# Patient Record
Sex: Male | Born: 1985 | Race: Black or African American | Hispanic: No | Marital: Single | Smoking: Never smoker
Health system: Southern US, Community
[De-identification: ages and names within clinical notes are randomized; demographics above are authoritative.]

## PROBLEM LIST (undated history)

## (undated) DIAGNOSIS — R569 Unspecified convulsions: Secondary | ICD-10-CM

## (undated) DIAGNOSIS — T8859XA Other complications of anesthesia, initial encounter: Secondary | ICD-10-CM

## (undated) DIAGNOSIS — N289 Disorder of kidney and ureter, unspecified: Secondary | ICD-10-CM

## (undated) DIAGNOSIS — D649 Anemia, unspecified: Secondary | ICD-10-CM

## (undated) DIAGNOSIS — N186 End stage renal disease: Secondary | ICD-10-CM

## (undated) HISTORY — PX: NEPHRECTOMY: SHX65

---
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
## 2009-05-12 ENCOUNTER — Emergency Department (HOSPITAL_COMMUNITY): Admission: EM | Admit: 2009-05-12 | Discharge: 2009-05-13 | Payer: Self-pay | Admitting: Emergency Medicine

## 2009-07-07 ENCOUNTER — Emergency Department (HOSPITAL_COMMUNITY): Admission: EM | Admit: 2009-07-07 | Discharge: 2009-07-07 | Payer: Self-pay | Admitting: Emergency Medicine

## 2010-02-03 DEATH — deceased

## 2010-06-24 LAB — URINE MICROSCOPIC-ADD ON

## 2010-06-24 LAB — DIFFERENTIAL
Basophils Absolute: 0 10*3/uL (ref 0.0–0.1)
Basophils Relative: 0 % (ref 0–1)
Eosinophils Absolute: 0.1 10*3/uL (ref 0.0–0.7)
Monocytes Relative: 13 % — ABNORMAL HIGH (ref 3–12)
Neutrophils Relative %: 77 % (ref 43–77)

## 2010-06-24 LAB — CBC
HCT: 45.9 % (ref 39.0–52.0)
Hemoglobin: 15.6 g/dL (ref 13.0–17.0)
RBC: 5.1 MIL/uL (ref 4.22–5.81)

## 2010-06-24 LAB — COMPREHENSIVE METABOLIC PANEL
ALT: 14 U/L (ref 0–53)
Alkaline Phosphatase: 111 U/L (ref 39–117)
BUN: 17 mg/dL (ref 6–23)
CO2: 25 mEq/L (ref 19–32)
Chloride: 107 mEq/L (ref 96–112)
GFR calc non Af Amer: 46 mL/min — ABNORMAL LOW (ref >60)
Glucose, Bld: 88 mg/dL (ref 70–99)
Potassium: 3.5 mEq/L (ref 3.5–5.1)
Sodium: 139 mEq/L (ref 135–145)
Total Bilirubin: 0.8 mg/dL (ref 0.3–1.2)
Total Protein: 7.9 g/dL (ref 6.0–8.3)

## 2010-06-24 LAB — URINALYSIS, ROUTINE W REFLEX MICROSCOPIC
Bilirubin Urine: NEGATIVE
Glucose, UA: NEGATIVE mg/dL
Ketones, ur: NEGATIVE mg/dL
Leukocytes, UA: NEGATIVE
Nitrite: NEGATIVE
Protein, ur: 100 mg/dL — AB
Specific Gravity, Urine: 1.01 (ref 1.005–1.030)
Urobilinogen, UA: 0.2 mg/dL (ref 0.0–1.0)
pH: 6.5 (ref 5.0–8.0)

## 2010-10-12 ENCOUNTER — Emergency Department (HOSPITAL_COMMUNITY)
Admission: EM | Admit: 2010-10-12 | Discharge: 2010-10-13 | Disposition: A | Discharge status: Home / Self Care | Payer: Self-pay | Attending: Emergency Medicine | Admitting: Emergency Medicine

## 2010-10-12 DIAGNOSIS — M549 Dorsalgia, unspecified: Secondary | ICD-10-CM | POA: Insufficient documentation

## 2010-10-13 ENCOUNTER — Emergency Department (HOSPITAL_COMMUNITY): Payer: Self-pay

## 2012-06-25 ENCOUNTER — Emergency Department (HOSPITAL_COMMUNITY): Payer: Self-pay

## 2012-06-25 ENCOUNTER — Emergency Department (HOSPITAL_COMMUNITY)
Admission: EM | Admit: 2012-06-25 | Discharge: 2012-06-25 | Disposition: A | Discharge status: Home / Self Care | Payer: Self-pay | Attending: Emergency Medicine | Admitting: Emergency Medicine

## 2012-06-25 ENCOUNTER — Encounter (HOSPITAL_COMMUNITY): Payer: Self-pay | Admitting: *Deleted

## 2012-06-25 DIAGNOSIS — R42 Dizziness and giddiness: Secondary | ICD-10-CM | POA: Insufficient documentation

## 2012-06-25 DIAGNOSIS — N289 Disorder of kidney and ureter, unspecified: Secondary | ICD-10-CM | POA: Insufficient documentation

## 2012-06-25 DIAGNOSIS — Y9289 Other specified places as the place of occurrence of the external cause: Secondary | ICD-10-CM | POA: Insufficient documentation

## 2012-06-25 DIAGNOSIS — M549 Dorsalgia, unspecified: Secondary | ICD-10-CM

## 2012-06-25 DIAGNOSIS — Y9301 Activity, walking, marching and hiking: Secondary | ICD-10-CM | POA: Insufficient documentation

## 2012-06-25 DIAGNOSIS — S79929A Unspecified injury of unspecified thigh, initial encounter: Secondary | ICD-10-CM | POA: Insufficient documentation

## 2012-06-25 DIAGNOSIS — S79919A Unspecified injury of unspecified hip, initial encounter: Secondary | ICD-10-CM | POA: Insufficient documentation

## 2012-06-25 DIAGNOSIS — IMO0002 Reserved for concepts with insufficient information to code with codable children: Secondary | ICD-10-CM | POA: Insufficient documentation

## 2012-06-25 DIAGNOSIS — Z905 Acquired absence of kidney: Secondary | ICD-10-CM | POA: Insufficient documentation

## 2012-06-25 LAB — POCT I-STAT, CHEM 8
BUN: 26 mg/dL — ABNORMAL HIGH (ref 6–23)
Calcium, Ion: 1.25 mmol/L — ABNORMAL HIGH (ref 1.12–1.23)
Creatinine, Ser: 2 mg/dL — ABNORMAL HIGH (ref 0.50–1.35)
Hemoglobin: 15 g/dL (ref 13.0–17.0)
TCO2: 27 mmol/L (ref 0–100)

## 2012-06-25 LAB — URINALYSIS, ROUTINE W REFLEX MICROSCOPIC
Glucose, UA: NEGATIVE mg/dL
Ketones, ur: NEGATIVE mg/dL
Leukocytes, UA: NEGATIVE
Protein, ur: >300 mg/dL — AB
Urobilinogen, UA: 0.2 mg/dL (ref 0.0–1.0)

## 2012-06-25 LAB — URINE MICROSCOPIC-ADD ON

## 2012-06-25 MED ORDER — OXYCODONE-ACETAMINOPHEN 5-325 MG PO TABS
1.0000 | ORAL_TABLET | Freq: Once | ORAL | Status: AC
  Administered 2012-06-25: 1 via ORAL
  Filled 2012-06-25: qty 1

## 2012-06-25 MED ORDER — OXYCODONE-ACETAMINOPHEN 5-325 MG PO TABS: 2.0000 | ORAL_TABLET | ORAL | Status: DC | PRN

## 2012-06-25 NOTE — ED Notes (Signed)
Pt in br 2 minutes after checking in
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
## 2012-06-26 NOTE — ED Provider Notes (Signed)
Medical screening examination/treatment/procedure(s) were performed by non-physician practitioner and as supervising physician I was immediately available for consultation/collaboration.  Veryl Speak, MD 06/26/12 [phone]

## 2018-02-19 ENCOUNTER — Emergency Department (HOSPITAL_COMMUNITY): Payer: Self-pay

## 2018-02-19 ENCOUNTER — Other Ambulatory Visit: Payer: Self-pay

## 2018-02-19 ENCOUNTER — Emergency Department (HOSPITAL_COMMUNITY)
Admission: EM | Admit: 2018-02-19 | Discharge: 2018-02-19 | Disposition: A | Discharge status: Home / Self Care | Payer: Self-pay | Attending: Emergency Medicine | Admitting: Emergency Medicine

## 2018-02-19 ENCOUNTER — Encounter (HOSPITAL_COMMUNITY): Payer: Self-pay

## 2018-02-19 DIAGNOSIS — Y998 Other external cause status: Secondary | ICD-10-CM | POA: Insufficient documentation

## 2018-02-19 DIAGNOSIS — Y9389 Activity, other specified: Secondary | ICD-10-CM | POA: Insufficient documentation

## 2018-02-19 DIAGNOSIS — W208XXA Other cause of strike by thrown, projected or falling object, initial encounter: Secondary | ICD-10-CM | POA: Insufficient documentation

## 2018-02-19 DIAGNOSIS — S62336A Displaced fracture of neck of fifth metacarpal bone, right hand, initial encounter for closed fracture: Secondary | ICD-10-CM | POA: Insufficient documentation

## 2018-02-19 DIAGNOSIS — Y929 Unspecified place or not applicable: Secondary | ICD-10-CM | POA: Insufficient documentation

## 2018-02-19 HISTORY — DX: Disorder of kidney and ureter, unspecified: N28.9

## 2018-02-19 MED ORDER — OXYCODONE-ACETAMINOPHEN 5-325 MG PO TABS: 1.0000 | ORAL_TABLET | ORAL | 0 refills | Status: DC | PRN
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
## 2018-02-19 NOTE — ED Triage Notes (Signed)
Pt reports he was working on his car and hood came down on right hand. Hand noted to be swollen with abrasions to fingers

## 2018-02-19 NOTE — Discharge Instructions (Signed)
You have cracked a small bone in your hand.  Splint, ice, elevate.  Prescription for pain medicine.  Follow-up with orthopedic doctor.  Phone number given.

## 2020-11-03 DIAGNOSIS — Z9289 Personal history of other medical treatment: Secondary | ICD-10-CM

## 2020-11-03 HISTORY — DX: Personal history of other medical treatment: Z92.89

## 2020-11-13 ENCOUNTER — Other Ambulatory Visit: Payer: Self-pay

## 2020-11-13 ENCOUNTER — Emergency Department (HOSPITAL_COMMUNITY): Payer: Medicaid Other

## 2020-11-13 ENCOUNTER — Inpatient Hospital Stay (HOSPITAL_COMMUNITY)
Admission: EM | Admit: 2020-11-13 | Discharge: 2020-11-24 | DRG: 674 | Disposition: A | Discharge status: Home / Self Care | Payer: Medicaid Other | Attending: Family Medicine | Admitting: Family Medicine

## 2020-11-13 ENCOUNTER — Ambulatory Visit (HOSPITAL_COMMUNITY)
Admission: EM | Admit: 2020-11-13 | Discharge: 2020-11-13 | Disposition: A | Discharge status: Home / Self Care | Payer: Self-pay | Attending: Emergency Medicine | Admitting: Emergency Medicine

## 2020-11-13 ENCOUNTER — Encounter (HOSPITAL_COMMUNITY): Payer: Self-pay | Admitting: Emergency Medicine

## 2020-11-13 DIAGNOSIS — Z28310 Unvaccinated for covid-19: Secondary | ICD-10-CM

## 2020-11-13 DIAGNOSIS — R42 Dizziness and giddiness: Secondary | ICD-10-CM | POA: Diagnosis present

## 2020-11-13 DIAGNOSIS — R7989 Other specified abnormal findings of blood chemistry: Secondary | ICD-10-CM | POA: Diagnosis present

## 2020-11-13 DIAGNOSIS — Z20822 Contact with and (suspected) exposure to covid-19: Secondary | ICD-10-CM | POA: Diagnosis present

## 2020-11-13 DIAGNOSIS — Z992 Dependence on renal dialysis: Secondary | ICD-10-CM

## 2020-11-13 DIAGNOSIS — Z6821 Body mass index (BMI) 21.0-21.9, adult: Secondary | ICD-10-CM

## 2020-11-13 DIAGNOSIS — D649 Anemia, unspecified: Secondary | ICD-10-CM

## 2020-11-13 DIAGNOSIS — R000 Tachycardia, unspecified: Secondary | ICD-10-CM | POA: Diagnosis present

## 2020-11-13 DIAGNOSIS — N1832 Chronic kidney disease, stage 3b: Secondary | ICD-10-CM | POA: Diagnosis present

## 2020-11-13 DIAGNOSIS — M899 Disorder of bone, unspecified: Secondary | ICD-10-CM

## 2020-11-13 DIAGNOSIS — N35919 Unspecified urethral stricture, male, unspecified site: Secondary | ICD-10-CM | POA: Diagnosis present

## 2020-11-13 DIAGNOSIS — M869 Osteomyelitis, unspecified: Secondary | ICD-10-CM

## 2020-11-13 DIAGNOSIS — D509 Iron deficiency anemia, unspecified: Secondary | ICD-10-CM | POA: Diagnosis present

## 2020-11-13 DIAGNOSIS — N186 End stage renal disease: Secondary | ICD-10-CM | POA: Diagnosis not present

## 2020-11-13 DIAGNOSIS — N2581 Secondary hyperparathyroidism of renal origin: Secondary | ICD-10-CM | POA: Diagnosis present

## 2020-11-13 DIAGNOSIS — R197 Diarrhea, unspecified: Secondary | ICD-10-CM | POA: Diagnosis present

## 2020-11-13 DIAGNOSIS — N19 Unspecified kidney failure: Secondary | ICD-10-CM

## 2020-11-13 DIAGNOSIS — E872 Acidosis: Secondary | ICD-10-CM | POA: Diagnosis present

## 2020-11-13 DIAGNOSIS — D631 Anemia in chronic kidney disease: Secondary | ICD-10-CM | POA: Diagnosis present

## 2020-11-13 DIAGNOSIS — Z905 Acquired absence of kidney: Secondary | ICD-10-CM

## 2020-11-13 DIAGNOSIS — N136 Pyonephrosis: Secondary | ICD-10-CM | POA: Diagnosis present

## 2020-11-13 DIAGNOSIS — N390 Urinary tract infection, site not specified: Secondary | ICD-10-CM

## 2020-11-13 DIAGNOSIS — E876 Hypokalemia: Secondary | ICD-10-CM | POA: Diagnosis present

## 2020-11-13 DIAGNOSIS — D72829 Elevated white blood cell count, unspecified: Secondary | ICD-10-CM

## 2020-11-13 DIAGNOSIS — N2882 Megaloureter: Secondary | ICD-10-CM | POA: Diagnosis present

## 2020-11-13 DIAGNOSIS — R634 Abnormal weight loss: Secondary | ICD-10-CM | POA: Diagnosis present

## 2020-11-13 DIAGNOSIS — Z8249 Family history of ischemic heart disease and other diseases of the circulatory system: Secondary | ICD-10-CM

## 2020-11-13 DIAGNOSIS — E538 Deficiency of other specified B group vitamins: Secondary | ICD-10-CM | POA: Diagnosis present

## 2020-11-13 DIAGNOSIS — N179 Acute kidney failure, unspecified: Principal | ICD-10-CM | POA: Diagnosis present

## 2020-11-13 DIAGNOSIS — R0789 Other chest pain: Secondary | ICD-10-CM

## 2020-11-13 LAB — COMPREHENSIVE METABOLIC PANEL
ALT: 9 U/L (ref 0–44)
AST: 13 U/L — ABNORMAL LOW (ref 15–41)
Albumin: 2.7 g/dL — ABNORMAL LOW (ref 3.5–5.0)
Alkaline Phosphatase: 112 U/L (ref 38–126)
Anion gap: 15 (ref 5–15)
BUN: 111 mg/dL — ABNORMAL HIGH (ref 6–20)
CO2: 10 mmol/L — ABNORMAL LOW (ref 22–32)
Calcium: 8 mg/dL — ABNORMAL LOW (ref 8.9–10.3)
Chloride: 106 mmol/L (ref 98–111)
Creatinine, Ser: 12.59 mg/dL — ABNORMAL HIGH (ref 0.61–1.24)
GFR, Estimated: 5 mL/min — ABNORMAL LOW (ref >60)
Glucose, Bld: 118 mg/dL — ABNORMAL HIGH (ref 70–99)
Potassium: 4.1 mmol/L (ref 3.5–5.1)
Sodium: 131 mmol/L — ABNORMAL LOW (ref 135–145)
Total Bilirubin: 0.2 mg/dL — ABNORMAL LOW (ref 0.3–1.2)
Total Protein: 7.2 g/dL (ref 6.5–8.1)

## 2020-11-13 LAB — CBC WITH DIFFERENTIAL/PLATELET
Abs Immature Granulocytes: 0.46 10*3/uL — ABNORMAL HIGH (ref 0.00–0.07)
Basophils Absolute: 0 10*3/uL (ref 0.0–0.1)
Basophils Relative: 0 %
Eosinophils Absolute: 0 10*3/uL (ref 0.0–0.5)
Eosinophils Relative: 0 %
HCT: 19.9 % — ABNORMAL LOW (ref 39.0–52.0)
Hemoglobin: 6 g/dL — CL (ref 13.0–17.0)
Immature Granulocytes: 2 %
Lymphocytes Relative: 1 %
Lymphs Abs: 0.2 10*3/uL — ABNORMAL LOW (ref 0.7–4.0)
MCH: 28.3 pg (ref 26.0–34.0)
MCHC: 30.2 g/dL (ref 30.0–36.0)
MCV: 93.9 fL (ref 80.0–100.0)
Monocytes Absolute: 0.9 10*3/uL (ref 0.1–1.0)
Monocytes Relative: 3 %
Neutro Abs: 28.3 10*3/uL — ABNORMAL HIGH (ref 1.7–7.7)
Neutrophils Relative %: 94 %
Platelets: 324 10*3/uL (ref 150–400)
RBC: 2.12 MIL/uL — ABNORMAL LOW (ref 4.22–5.81)
RDW: 14.6 % (ref 11.5–15.5)
WBC: 29.9 10*3/uL — ABNORMAL HIGH (ref 4.0–10.5)
nRBC: 0 % (ref 0.0–0.2)

## 2020-11-13 LAB — URINALYSIS, ROUTINE W REFLEX MICROSCOPIC
Bilirubin Urine: NEGATIVE
Glucose, UA: NEGATIVE mg/dL
Ketones, ur: NEGATIVE mg/dL
Nitrite: POSITIVE — AB
Protein, ur: 100 mg/dL — AB
Specific Gravity, Urine: 1.009 (ref 1.005–1.030)
WBC, UA: >50 WBC/hpf — ABNORMAL HIGH (ref 0–5)
pH: 5 (ref 5.0–8.0)

## 2020-11-13 LAB — IRON AND TIBC
Iron: 9 ug/dL — ABNORMAL LOW (ref 45–182)
Saturation Ratios: 6 % — ABNORMAL LOW (ref 17.9–39.5)
TIBC: 153 ug/dL — ABNORMAL LOW (ref 250–450)
UIBC: 144 ug/dL

## 2020-11-13 LAB — ABO/RH: ABO/RH(D): B POS

## 2020-11-13 LAB — URIC ACID: Uric Acid, Serum: 8.7 mg/dL — ABNORMAL HIGH (ref 3.7–8.6)

## 2020-11-13 LAB — RETICULOCYTES
Immature Retic Fract: 20.8 % — ABNORMAL HIGH (ref 2.3–15.9)
RBC.: 2.14 MIL/uL — ABNORMAL LOW (ref 4.22–5.81)
Retic Count, Absolute: 63.8 10*3/uL (ref 19.0–186.0)
Retic Ct Pct: 3 % (ref 0.4–3.1)

## 2020-11-13 LAB — PROTIME-INR
INR: 1.5 — ABNORMAL HIGH (ref 0.8–1.2)
Prothrombin Time: 18.3 seconds — ABNORMAL HIGH (ref 11.4–15.2)

## 2020-11-13 LAB — MAGNESIUM: Magnesium: 1.4 mg/dL — ABNORMAL LOW (ref 1.7–2.4)

## 2020-11-13 LAB — VITAMIN B12: Vitamin B-12: 249 pg/mL (ref 180–914)

## 2020-11-13 LAB — D-DIMER, QUANTITATIVE: D-Dimer, Quant: 10.97 ug/mL-FEU — ABNORMAL HIGH (ref 0.00–0.50)

## 2020-11-13 LAB — RESP PANEL BY RT-PCR (FLU A&B, COVID) ARPGX2
Influenza A by PCR: NEGATIVE
Influenza B by PCR: NEGATIVE
SARS Coronavirus 2 by RT PCR: NEGATIVE

## 2020-11-13 LAB — PREPARE RBC (CROSSMATCH)

## 2020-11-13 LAB — FERRITIN: Ferritin: 1033 ng/mL — ABNORMAL HIGH (ref 24–336)

## 2020-11-13 LAB — PHOSPHORUS: Phosphorus: 8.8 mg/dL — ABNORMAL HIGH (ref 2.5–4.6)

## 2020-11-13 LAB — FOLATE: Folate: 6.8 ng/mL (ref >5.9)

## 2020-11-13 LAB — LACTATE DEHYDROGENASE: LDH: 143 U/L (ref 98–192)

## 2020-11-13 LAB — FIBRINOGEN: Fibrinogen: >800 mg/dL — ABNORMAL HIGH (ref 210–475)

## 2020-11-13 MED ORDER — SODIUM CHLORIDE 0.9% FLUSH
3.0000 mL | Freq: Two times a day (BID) | INTRAVENOUS | Status: DC
  Administered 2020-11-13 – 2020-11-22 (×14): 3 mL via INTRAVENOUS

## 2020-11-13 MED ORDER — SODIUM CHLORIDE 0.9 % IV BOLUS
1000.0000 mL | Freq: Once | INTRAVENOUS | Status: AC
  Administered 2020-11-13: 1000 mL via INTRAVENOUS

## 2020-11-13 MED ORDER — SODIUM CHLORIDE 0.9 % IV SOLN: 10.0000 mL/h | Freq: Once | INTRAVENOUS | Status: DC

## 2020-11-13 MED ORDER — POLYETHYLENE GLYCOL 3350 17 G PO PACK: 17.0000 g | PACK | Freq: Every day | ORAL | Status: DC | PRN

## 2020-11-13 MED ORDER — SODIUM CHLORIDE 0.9 % IV SOLN
1.0000 g | INTRAVENOUS | Status: DC
  Administered 2020-11-13: 1 g via INTRAVENOUS
  Filled 2020-11-13 (×2): qty 10

## 2020-11-13 MED ORDER — ONDANSETRON HCL 4 MG/2ML IJ SOLN
INTRAMUSCULAR | Status: AC
  Filled 2020-11-13: qty 2

## 2020-11-13 MED ORDER — ONDANSETRON HCL 4 MG/2ML IJ SOLN: 4.0000 mg | Freq: Once | INTRAMUSCULAR | Status: DC

## 2020-11-13 MED ORDER — ACETAMINOPHEN 650 MG RE SUPP: 650.0000 mg | Freq: Four times a day (QID) | RECTAL | Status: DC | PRN

## 2020-11-13 MED ORDER — LACTATED RINGERS IV SOLN: INTRAVENOUS | Status: DC

## 2020-11-13 MED ORDER — ONDANSETRON HCL 4 MG/2ML IJ SOLN
4.0000 mg | Freq: Once | INTRAMUSCULAR | Status: AC
  Administered 2020-11-13: 4 mg via INTRAVENOUS

## 2020-11-13 MED ORDER — ACETAMINOPHEN 325 MG PO TABS: 650.0000 mg | ORAL_TABLET | Freq: Four times a day (QID) | ORAL | Status: DC | PRN

## 2020-11-13 NOTE — ED Notes (Signed)
[facility] Contacted for transport 

## 2020-11-13 NOTE — ED Provider Notes (Signed)
I saw and evaluated the patient, reviewed the resident's note and I agree with the findings and plan.  Pertinent History: This patient is an extremely friendly 35 year old male who unfortunately presents to the hospital with multiple problems, he initially states that he found that his heart rate was high and his blood pressure was low and he has been very weak which has been progressive over the last month.  He recalls not having any significant chronic medical conditions other than having one of his kidneys removed at the age of 4 months after he had what appears to be hydronephrosis based on his mother's description of how they treated it.  He has never had any further medical issues he has not been on steroids he has not been plagued with any surgical problems and takes no daily medicines.  He reports to me that he has had a slight decrease in his urinary output over the last month but still has urinated 3 times today.  He has been progressively nauseated but has not vomited, he denies diarrhea, has not had any significant amount of rectal bleeding.  He denies abdominal pain or chest pain or coughing or shortness of breath but does state that when he exerts himself he does get a little bit dyspneic.  Pertinent Exam findings: On exam this patient has a moon pie face, he has no lymphadenopathy of his neck or his inguinal region, there is a slight prominence of the right sternal end of the clavicle on the right.  The patient has no edema of the legs, he has pale conjunctive a and pale nailbeds.  He has moist mucous membranes.  His heart and lung exam is unremarkable except for mild tachycardia and he has no abdominal tenderness or hepatosplenomegaly.  I was personally present and directly supervised the following procedures:  Medical evaluation Resuscitation for severe anemia and hematologic abnormalities Evaluation with CT scan of chest abdomen pelvis to look for causes of the patient's significant  abnormality such as cancer Consider that this could be some type of lymphoma or other significant abnormality Urinary tract infection is possible as well Rocephin was given, urinalysis reveals signs of UTI Creatinine is severely abnormal and he is in florid kidney failure but thankfully normokalemic The patient will need to be admitted to high level of care in the hospital for for ongoing evaluation and stabilizing care, critical care provided  .Critical Care  Date/Time: 11/13/2020 10:26 PM Performed by: Noemi Chapel, MD Authorized by: Noemi Chapel, MD   Critical care provider statement:    Critical care time (minutes):  35   Critical care time was exclusive of:  Separately billable procedures and treating other patients and teaching time   Critical care was necessary to treat or prevent imminent or life-threatening deterioration of the following conditions:  Renal failure   Critical care was time spent personally by me on the following activities:  Blood draw for specimens, development of treatment plan with patient or surrogate, discussions with consultants, evaluation of patient's response to treatment, examination of patient, obtaining history from patient or surrogate, ordering and performing treatments and interventions, ordering and review of laboratory studies, ordering and review of radiographic studies, pulse oximetry, re-evaluation of patient's condition and review of old charts Comments:         I personally interpreted the EKG as well as the resident and agree with the interpretation on the resident's chart.  Final diagnoses:  AKI (acute kidney injury) ([place])  Anemia, unspecified type  Severe Anemia Severe Renal Failure UTI    Noemi Chapel, MD 11/14/20 1446

## 2020-11-13 NOTE — ED Notes (Signed)
Foley was attempted, pt has a lot of resistance when inserting foley. MD made aware.

## 2020-11-13 NOTE — ED Notes (Signed)
Back to room at this time.

## 2020-11-13 NOTE — ED Notes (Signed)
Consent signed by mother at bedside. Pt feels weak to sign. Risks and consequences understood by patient and mother.

## 2020-11-13 NOTE — ED Provider Notes (Signed)
Emergency Medicine Provider Triage Evaluation Note  Micheal Bean , a 35 y.o. male  was evaluated in triage.  Pt complains of headaches, generalized weakness, lightheadedness, nausea, and diarrhea.  Patient states that he has had diarrhea intermittently over the last 2 weeks.  Denies any blood in stool or melena.  Denies any recent ABX or travel.    Patient states that he has had headaches, generalized weakness, and lightheadedness over the last 3 to 4 days.  Denies any syncopal episodes.  Reports that he has lost 20 pounds in the last month.  Patient has had decreased appetite intermittently over this time.  Patient was sent to emergency department from urgent care.  Patient reports feeling better after getting fluid bolus.  Review of Systems  Positive: Headaches, generalized weakness, lightheadedness, nausea, diarrhea Negative: Abdominal pain, vomiting, blood in stool, melena, fever, chills, URI symptoms, difficulty urinating, dysuria, decreased urinary output  Physical Exam  BP 118/68 (BP Location: Right Arm)   Pulse (!) 118   Temp 98.3 F (36.8 C) (Oral)   Resp 18   SpO2 100%  Gen:   Awake, no distress   Resp:  Normal effort, lungs clear to auscultation bilaterally MSK:   Moves extremities without difficulty  Other:  Abdomen soft, nondistended, nontender, no guarding, no rebound tenderness.  Medical Decision Making  Medically screening exam initiated at 7:30 PM.  Appropriate orders placed.  NIXIN COUSIN was informed that the remainder of the evaluation will be completed by another provider, this initial triage assessment does not replace that evaluation, and the importance of remaining in the ED until their evaluation is complete.  The patient appears stable so that the remainder of the work up may be completed by another provider.      Micheal Beckwith, PA-C 11/13/20 1938    Drenda Freeze, MD 11/13/20 [phone]

## 2020-11-13 NOTE — ED Notes (Signed)
Report given to ED

## 2020-11-13 NOTE — ED Triage Notes (Signed)
Pt BIB [facility] from urgent care, c/o diarrhea x weeks, lightheadedness when standing and weakness. +orthostatic vital signs.

## 2020-11-13 NOTE — H&P (Signed)
History and Physical   Micheal Bean [id number] DOB: 03/16/1986 DOA: 11/13/2020  PCP: Patient, No Pcp Per (Inactive)   Patient coming from: Home  Chief Complaint: Headache, lightheadedness, chest tightness for 3 to 4 days.  HPI: Micheal Bean is a 35 y.o. male with medical history significant of history of right nephrectomy as a infant presenting with constellation of intermittent symptoms including headache, lightheadedness and chest tightness, As above has had 3-4 days of head ache, lightheadedness, chest tightness, nausea, diarrhea.  He tried taking some fluids at home did seem to help he also tried taking a dose of his mother's amlodipine but did not seem to help.  He does feel better laying down but it feels worse standing up.  He does note losing 20 pounds in the last 2 months.  He was seen in urgent care and then sent to the ED for further evaluation.  He denies fevers, chills, shortness of breath, abdominal pain, constipation. Denies dark or bloody stool.  ED Course: Vital signs in the ED significant for heart rate intermittently in the 100s otherwise stable.  Lab work-up showed CMP with sodium 131, bicarb 10, BUN 111, creatinine 12.59 which is increased from normal per last labs were 8 years ago.  Calcium of 8 which corrects considering albumin 2.7.  CBC showed hemoglobin of 6 and leukocytosis to 29.9 with some immature granulocytes noted.  Respiratory panel for flu and COVID pending.  PT and INR pending.  Urinalysis showed protein, nitrates, leukocytes, white cells, bacteria.  Urine cultures pending.  Reticulocyte count had returned separately normal.  Patient was typed and screened.  VBG ordered and is pending.  Other labs ordered that are pending include magnesium, phosphorus, D-dimer, uric acid, fibrinogen, B12, folate, iron, ferritin, LDH, haptoglobin.  Imaging study included chest x-ray which showed no abnormality of clavicle and no other acute abnormalities.  CT chest did show edema  and soft tissue changes at the clavicle concerning for osteomyelitis versus bone tumor and CT of the abdomen pelvis did show that the patient is status post right nephrectomy which was known and had severe left hydronephrosis and hydroureter to the bladder.  Patient was discussed with nephrology who had nothing to add beyond decompression of bladder and ureter with Foley placement and will see the patient morning.  Review of Systems: As per HPI otherwise all other systems reviewed and are negative.  Past Medical History:  Diagnosis Date   Renal disorder    only has right kidney    Past Surgical History:  Procedure Laterality Date   NEPHRECTOMY     childhhod, RT side    Social History  reports that he has never smoked. He has never used smokeless tobacco. He reports current alcohol use. He reports current drug use. Drug: Marijuana.  No Known Allergies  Family History  Problem Relation Age of Onset   Hypertension Mother   Reviewed on admission  Prior to Admission medications   Medication Sig Start Date End Date Taking? Authorizing Provider  naproxen sodium (ALEVE) 220 MG tablet Take 220 mg by mouth daily as needed (pain).    [provider]  oxyCODONE-acetaminophen (PERCOCET/ROXICET) 5-325 MG per tablet Take 2 tablets by mouth every 4 (four) hours as needed for pain. 06/25/12   Piepenbrink, Anderson [place], PA-C  oxyCODONE-acetaminophen (PERCOCET/ROXICET) 5-325 MG tablet Take 1 tablet by mouth every 4 (four) hours as needed for severe pain. 02/19/18   Nat Christen, MD    Physical Exam: Vitals:   11/13/20  2255 11/13/20 2300 11/13/20 2330 11/14/20 0003  BP: 129/78 124/75 129/79 128/74  Pulse: (!) 101 98 99 (!) 108  Resp: '19 10 15 '$ (!) 26  Temp:    98.8 F (37.1 C)  TempSrc:    Oral  SpO2: 100% 100% 100%    Physical Exam Constitutional:      General: He is not in acute distress.    Appearance: Normal appearance.  HENT:     Head: Normocephalic and atraumatic.      Mouth/Throat:     Mouth: Mucous membranes are moist.     Pharynx: Oropharynx is clear.  Eyes:     Extraocular Movements: Extraocular movements intact.     Pupils: Pupils are equal, round, and reactive to light.  Cardiovascular:     Rate and Rhythm: Regular rhythm. Tachycardia present.     Pulses: Normal pulses.     Heart sounds: Normal heart sounds.  Pulmonary:     Effort: Pulmonary effort is normal. No respiratory distress.     Breath sounds: Normal breath sounds.  Abdominal:     General: Bowel sounds are normal. There is no distension.     Palpations: Abdomen is soft.     Tenderness: There is no abdominal tenderness.  Musculoskeletal:        General: No swelling or deformity.  Skin:    General: Skin is warm and dry.  Neurological:     General: No focal deficit present.     Mental Status: Mental status is at baseline.   Labs on Admission: I have personally reviewed following labs and imaging studies  CBC: Recent Labs  Lab 11/13/20 1951 11/14/20 0000  WBC 29.9*  --   NEUTROABS 28.3*  --   HGB 6.0* 6.8*  HCT 19.9* 20.0*  MCV 93.9  --   PLT 324  --     Basic Metabolic Panel: Recent Labs  Lab 11/13/20 1951 11/13/20 2143 11/14/20 0000  NA 131*  --  134*  K 4.1  --  4.5  CL 106  --   --   CO2 10*  --   --   GLUCOSE 118*  --   --   BUN 111*  --   --   CREATININE 12.59*  --   --   CALCIUM 8.0*  --   --   MG  --  1.4*  --   PHOS  --  8.8*  --     GFR: CrCl cannot be calculated (Unknown ideal weight.).  Liver Function Tests: Recent Labs  Lab 11/13/20 1951  AST 13*  ALT 9  ALKPHOS 112  BILITOT 0.2*  PROT 7.2  ALBUMIN 2.7*    Urine analysis:    Component Value Date/Time   COLORURINE YELLOW 11/13/2020 1931   APPEARANCEUR CLOUDY (A) 11/13/2020 1931   LABSPEC 1.009 11/13/2020 1931   PHURINE 5.0 11/13/2020 1931   GLUCOSEU NEGATIVE 11/13/2020 1931   HGBUR SMALL (A) 11/13/2020 1931   BILIRUBINUR NEGATIVE 11/13/2020 1931   KETONESUR NEGATIVE 11/13/2020  1931   PROTEINUR 100 (A) 11/13/2020 1931   UROBILINOGEN 0.2 06/25/2012 1826   NITRITE POSITIVE (A) 11/13/2020 1931   LEUKOCYTESUR LARGE (A) 11/13/2020 1931    Radiological Exams on Admission: CT ABDOMEN PELVIS WO CONTRAST  Result Date: 11/13/2020 CLINICAL DATA:  Diarrhea swelling over proximal right clavicle EXAM: CT CHEST, ABDOMEN AND PELVIS WITHOUT CONTRAST TECHNIQUE: Multidetector CT imaging of the chest, abdomen and pelvis was performed following the standard protocol without IV contrast. COMPARISON:  CT 06/25/2012 FINDINGS: CT CHEST FINDINGS Cardiovascular: Limited evaluation without intravenous contrast. Aorta is nonaneurysmal. Normal cardiac size. No pericardial effusion. Mediastinum/Nodes: Midline trachea. No thyroid mass. No suspicious nodes. Esophagus normal Lungs/Pleura: Lungs are clear. No pleural effusion or pneumothorax. Musculoskeletal: Mild edema and soft tissue swelling/thickening centered at the right sternoclavicular joint. Irregular lucency within the head of the right clavicle. Similar but less extensive changes involving head of left clavicle. Focal heterogeneous lucency within the right first rib, series 4, image 34. Bones appear diffusely dense CT ABDOMEN PELVIS FINDINGS Hepatobiliary: No focal liver abnormality is seen. No gallstones, gallbladder wall thickening, or biliary dilatation. Pancreas: Unremarkable. No pancreatic ductal dilatation or surrounding inflammatory changes. Spleen: Normal in size without focal abnormality. Adrenals/Urinary Tract: Adrenal glands are normal. Status post right nephrectomy. Severe left-sided hydronephrosis and hydroureter with tortuous left ureter dilated down to the level of the bladder. Dilated right ureteral remnant with cystic enlargement of the distal right ureter. Bladder is unremarkable. Stomach/Bowel: Stomach nonenlarged. No dilated small bowel. Negative appendix. No acute bowel wall thickening Vascular/Lymphatic: Nonaneurysmal aorta.  No  suspicious nodes Reproductive: Prostate is unremarkable. Other: Negative for pelvic effusion or free air Musculoskeletal: Bones appear diffusely increased in density suggesting metabolic bone disease, possibly due to chronic kidney disease. 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 Interval resorptive changes at bilateral SI joints with some sclerosis. No inflammatory changes by CT. IMPRESSION: 1. Edema and soft tissue thickening centered the right sternoclavicular joint with heterogeneous lucency involving the head of the right clavicle. Similar changes involving head of left clavicle though less extensive. Findings could be secondary to osteomyelitis and infection with bone tumor an additional consideration. Further evaluation with MRI could be considered. Additional heterogeneous focal indeterminate lucent lesion in the right first rib posteriorly. 2. Status post right nephrectomy. Severe left-sided hydronephrosis and hydroureter down to the level of the bladder. Interval cortical atrophy of left kidney. There is right ureteral remnant which is also dilated with cystic change distally. Electronically Signed   By: Donavan Foil M.D.   On: 11/13/2020 23:11   CT Chest Wo Contrast  Result Date: 11/13/2020 CLINICAL DATA:  Diarrhea swelling over proximal right clavicle EXAM: CT CHEST, ABDOMEN AND PELVIS WITHOUT CONTRAST TECHNIQUE: Multidetector CT imaging of the chest, abdomen and pelvis was performed following the standard protocol without IV contrast. COMPARISON:  CT 06/25/2012 FINDINGS: CT CHEST FINDINGS Cardiovascular: Limited evaluation without intravenous contrast. Aorta is nonaneurysmal. Normal cardiac size. No pericardial effusion. Mediastinum/Nodes: Midline trachea. No thyroid mass. No suspicious nodes. Esophagus normal Lungs/Pleura: Lungs are clear. No pleural effusion or pneumothorax. Musculoskeletal: Mild edema and soft tissue swelling/thickening centered at the right sternoclavicular joint.  Irregular lucency within the head of the right clavicle. Similar but less extensive changes involving head of left clavicle. Focal heterogeneous lucency within the right first rib, series 4, image 34. Bones appear diffusely dense CT ABDOMEN PELVIS FINDINGS Hepatobiliary: No focal liver abnormality is seen. No gallstones, gallbladder wall thickening, or biliary dilatation. Pancreas: Unremarkable. No pancreatic ductal dilatation or surrounding inflammatory changes. Spleen: Normal in size without focal abnormality. Adrenals/Urinary Tract: Adrenal glands are normal. Status post right nephrectomy. Severe left-sided hydronephrosis and hydroureter with tortuous left ureter dilated down to the level of the bladder. Dilated right ureteral remnant with cystic enlargement of the distal right ureter. Bladder is unremarkable. Stomach/Bowel: Stomach nonenlarged. No dilated small bowel. Negative appendix. No acute bowel wall thickening Vascular/Lymphatic: Nonaneurysmal aorta.  No suspicious nodes Reproductive: Prostate is unremarkable. Other: Negative for pelvic  effusion or free air Musculoskeletal: Bones appear diffusely increased in density suggesting metabolic bone disease, possibly due to chronic kidney disease. 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 Interval resorptive changes at bilateral SI joints with some sclerosis. No inflammatory changes by CT. IMPRESSION: 1. Edema and soft tissue thickening centered the right sternoclavicular joint with heterogeneous lucency involving the head of the right clavicle. Similar changes involving head of left clavicle though less extensive. Findings could be secondary to osteomyelitis and infection with bone tumor an additional consideration. Further evaluation with MRI could be considered. Additional heterogeneous focal indeterminate lucent lesion in the right first rib posteriorly. 2. Status post right nephrectomy. Severe left-sided hydronephrosis and hydroureter down to the level  of the bladder. Interval cortical atrophy of left kidney. There is right ureteral remnant which is also dilated with cystic change distally. Electronically Signed   By: Donavan Foil M.D.   On: 11/13/2020 23:11   DG Chest Portable 1 View  Result Date: 11/13/2020 CLINICAL DATA:  Evaluate proximal right clavicular head. EXAM: PORTABLE CHEST 1 VIEW COMPARISON:  None. FINDINGS: No gross abnormality of the proximal right clavicular head is evident within the limitations of a single portable frontal radiograph of the chest. Low lung volumes with streaky opacities favoring atelectatic change. No consolidative process. No pneumothorax or layering effusion. The cardiomediastinal contours are unremarkable. No acute osseous or soft tissue abnormality. Telemetry leads overlie the chest. IMPRESSION: No gross abnormality of the proximal right clavicular head though evaluation is limited on this single view portable chest radiograph. Could consider dedicated clavicular radiographs or cross-sectional imaging if there is persisting clinical concern. Electronically Signed   By: Lovena Le M.D.   On: 11/13/2020 22:36    EKG: Independently reviewed.  Sinus tachycardia 114 bpm.  QTc 457.  Assessment/Plan Principal Problem:   Acute renal failure (ARF) (HCC) Active Problems:   Acute lower UTI   Leukocytosis   Bone lesion  Acute renal failure > Patient noted to have acute renal failure with creatinine greater than 12 from a normal baseline but last check was 8 years ago. > Does have history of solitary kidney and is status post right nephrectomy as an infant suspected to be due to hydronephrosis at that time. > Patient found to have severe hydronephrosis of the remaining left kidney. > Nephrology consulted by EDP who recommend Foley placement and monitoring response and will see patient in the morning. - Appreciate nephrology recommendations - Continue with catheter placement - Trend renal function and  electrolytes  UTI Leukocytosis > Patient with evidence of UTI on urinalysis with protein, nitrate, leukocytes, white cells, bacteria.  Also noted to have leukocytosis to 29.9 which also could be concerning for possible malignancy as below. > Started on ceftriaxone in the ED - Continue with ceftriaxone - Trend fever curve and white count - Follow-up urine culture and blood culture  Bone lesion Leukocytosis > Patient has leukocytosis and UTI as above.  Also noted to have bony abnormality of clavicle. > T scan showed changes consistent with possible osteomyelitis versus bone tumor. > Patient also reporting weight loss over the last 2 months concerning for malignancy. > With concurrent infection difficult to delineate what degree of leukocytosis could be from infection versus malignancy also with possibility of osteomyelitis and no primaries or other lesions other than rib located on CT scan (though this was non-contrast) we will continue with infectious treatment and monitoring response. > He may have progressed to bacteremia and hematogenous spread for osteomyelitis  in the setting of UTI and hydronephrosis however this does seem less likely given his presentation does not seem severe enough for this. - We will likely benefit from oncology consult in the morning pending on response to initial therapy. - Continue to treat infection and monitor response as above. - Follow-up labs ordered in ED  Anemia > Noted to have significant anemia with a hemoglobin of 6. > Labs that had returned include a reticulocyte count which was inappropriately normal.  Some immature granulocytes also noted on CBC. > Could be related to possible malignancy as above however also could be related to gradual worsening of renal function with hydronephrosis leading to worsening anemia. > No dark or bloody bowel movements. - Check FOBT  - Continue with transfusion initiated in the ED - Trend hemoglobin - Follow-up iron,  folate, B12, ferritin, LDH, haptoglobin  DVT prophylaxis: SCDs Code Status:   Full  Family Communication:  Mother updated at bedside   Disposition Plan:   Patient is from:  Home  Anticipated DC to:  Home  Anticipated DC date:  1 to 4 days  Anticipated DC barriers: None  Consults called:  Nephrology consulted by EDP who will see the patient morning.  Will likely benefit from oncology consult in the morning as well pending on results.   Admission status:  Observation, telemetry   Severity of Illness: The appropriate patient status for this patient is OBSERVATION. Observation status is judged to be reasonable and necessary in order to provide the required intensity of service to ensure the patient's safety. The patient's presenting symptoms, physical exam findings, and initial radiographic and laboratory data in the context of their medical condition is felt to place them at decreased risk for further clinical deterioration. Furthermore, it is anticipated that the patient will be medically stable for discharge from the hospital within 2 midnights of admission. The following factors support the patient status of observation.   " The patient's presenting symptoms include headache, lightheadedness, chest tightness, nausea, weight loss. " The physical exam findings include tachycardia. " The initial radiographic and laboratory data are Lab work-up showed CMP with sodium 131, bicarb 10, BUN 111, creatinine 12.59 which is increased from normal per last labs were 8 years ago.  Calcium of 8 which corrects considering albumin 2.7.  CBC showed hemoglobin of 6 and leukocytosis to 29.9 with some immature granulocytes noted.  Respiratory panel for flu and COVID pending.  PT and INR pending.  Urinalysis showed protein, nitrates, leukocytes, white cells, bacteria.  Urine cultures pending.  Reticulocyte count had returned separately normal.  Patient was typed and screened.  VBG ordered and is pending.  Other labs  ordered that are pending include magnesium, phosphorus, D-dimer, uric acid, fibrinogen, B12, folate, iron, ferritin, LDH, haptoglobin.  Imaging study included chest x-ray which showed no abnormality of clavicle and no other acute abnormalities.  CT chest did show edema and soft tissue changes at the clavicle concerning for osteomyelitis versus bone tumor and CT of the abdomen pelvis did show that the patient is status post right nephrectomy which was known and had severe left hydronephrosis and hydroureter to the bladder   Marcelyn Bruins MD [facility]  How to contact the [facility] Attending or Consulting provider [place] or covering provider during after hours [place], for this patient?   Check the care team in [facility] and look for a) attending/consulting [facility] provider listed and b) the [facility] team listed Log into [email or web address] and use [facility]'s universal password to access. If you do not have the password, please contact the hospital operator. Locate the [facility] provider you are looking for under [facility] and page to a number that you can be directly reached. If you still have difficulty reaching the provider, please page the [facility] (Director on Call) for the Hospitalists listed on amion for assistance.  11/14/2020, 12:13 AM

## 2020-11-13 NOTE — ED Notes (Signed)
Patient transported to CT 

## 2020-11-13 NOTE — ED Notes (Signed)
Report to [facility].  

## 2020-11-13 NOTE — ED Triage Notes (Signed)
Patient is being discharged from the Urgent Care and sent to the Emergency Department via [place]. Per Dr. Alphonzo Cruise, patient is in need of higher level of care due to patients symptoms and condition. Patient is aware and verbalizes understanding of plan of care.  Vitals:   11/13/20 1810 11/13/20 1853  BP: (!) 97/54 123/62  Pulse: (!) 126 (!) 111  Temp: 98.5 F (36.9 C)   SpO2: 100% 100%

## 2020-11-13 NOTE — ED Provider Notes (Signed)
[facility] EMERGENCY DEPARTMENT Provider Note   CSN: [id number] Arrival date & time: 11/13/20  1921     History Chief Complaint  Patient presents with   Dizziness    Micheal Bean is a 35 y.o. male.   Dizziness Associated symptoms: no chest pain, no palpitations, no shortness of breath and no vomiting    35 year old male with a past medical history of single kidney presenting to the emergency department with 3 to 4 weeks of fatigue and unintentional weight loss.  Patient states that his fatigue is worse with exertion.  He states that he will become lightheaded if he stands too quickly or moves too suddenly.  He reports some nausea and decreased appetite.  He reports nonbloody diarrhea, 2-3 episodes per day intermittently over the past several weeks.  He states that he believes he has lost around 20 pounds in the past 3 weeks.  He denies any vomiting.  He denies any chest pain, shortness of breath.  He denies any night sweats.  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  He denies any alcohol use, drug use.  He denies any previous episodes of similar symptoms.  Past Medical History:  Diagnosis Date   Renal disorder    only has right kidney    Patient Active Problem List   Diagnosis Date Noted   Acute renal failure (ARF) ([facility]) 11/13/2020   Acute lower UTI 11/13/2020   Leukocytosis 11/13/2020   Bone lesion 11/13/2020    Past Surgical History:  Procedure Laterality Date   NEPHRECTOMY     childhhod, RT side    Family History  Problem Relation Age of Onset   Hypertension Mother    Social History   Tobacco Use   Smoking status: Never   Smokeless tobacco: Never  Substance Use Topics   Alcohol use: Yes    Comment: occassional   Drug use: Yes    Types: Marijuana   Home Medications Prior to Admission medications   Medication Sig Start Date End Date Taking? Authorizing Provider  naproxen sodium (ALEVE) 220 MG tablet Take 220 mg by  mouth daily as needed (pain).   Yes [provider]  oxyCODONE-acetaminophen (PERCOCET/ROXICET) 5-325 MG per tablet Take 2 tablets by mouth every 4 (four) hours as needed for pain. Patient not taking: Reported on 11/13/2020 06/25/12   Piepenbrink, Anderson Malta, PA-C  oxyCODONE-acetaminophen (PERCOCET/ROXICET) 5-325 MG tablet Take 1 tablet by mouth every 4 (four) hours as needed for severe pain. Patient not taking: No sig reported 02/19/18   Nat Christen, MD    Allergies    Patient has no known allergies.  Review of Systems   Review of Systems  Constitutional:  Positive for activity change, appetite change, fatigue and unexpected weight change. Negative for chills and fever.  HENT:  Negative for ear pain and sore throat.   Eyes:  Negative for pain and visual disturbance.  Respiratory:  Negative for cough and shortness of breath.   Cardiovascular:  Negative for chest pain and palpitations.  Gastrointestinal:  Negative for abdominal pain and vomiting.  Genitourinary:  Negative for dysuria and hematuria.  Musculoskeletal:  Negative for arthralgias and back pain.  Skin:  Negative for color change and rash.  Neurological:  Positive for dizziness. Negative for seizures and syncope.  All other systems reviewed and are negative.  Physical Exam Updated Vital Signs BP 129/79   Pulse 99   Temp 98.3 F (36.8 C) (Oral)  Resp 15   SpO2 100%   Physical Exam Vitals and nursing note reviewed.  Constitutional:      General: He is not in acute distress.    Appearance: He is well-developed. He is ill-appearing. He is not toxic-appearing.  HENT:     Head: Normocephalic and atraumatic.     Mouth/Throat:     Comments: No gum bleeding Eyes:     Conjunctiva/sclera: Conjunctivae normal.  Neck:     Comments: No cervical or supraclavicular lymphadenopathy Cardiovascular:     Rate and Rhythm: Normal rate and regular rhythm.     Heart sounds: No murmur heard.    Comments: Significant asymmetry  between the proximal clavicular heads, there is a firm immobile mass overlying the right proximal clavicular head Pulmonary:     Effort: Pulmonary effort is normal. No respiratory distress.     Breath sounds: Normal breath sounds.  Abdominal:     Palpations: Abdomen is soft.     Tenderness: There is no abdominal tenderness. There is no guarding or rebound.  Musculoskeletal:     Cervical back: Neck supple. No rigidity or tenderness.  Skin:    General: Skin is warm and dry.     Capillary Refill: Capillary refill takes 2 to 3 seconds.     Comments: No petechial rash  Neurological:     General: No focal deficit present.     Mental Status: He is alert and oriented to person, place, and time.    ED Results / Procedures / Treatments   Labs (all labs ordered are listed, but only abnormal results are displayed) Labs Reviewed  COMPREHENSIVE METABOLIC PANEL - Abnormal; Notable for the following components:      Result Value   Sodium 131 (*)    CO2 10 (*)    Glucose, Bld 118 (*)    BUN 111 (*)    Creatinine, Ser 12.59 (*)    Calcium 8.0 (*)    Albumin 2.7 (*)    AST 13 (*)    Total Bilirubin 0.2 (*)    GFR, Estimated 5 (*)    All other components within normal limits  CBC WITH DIFFERENTIAL/PLATELET - Abnormal; Notable for the following components:   WBC 29.9 (*)    RBC 2.12 (*)    Hemoglobin 6.0 (*)    HCT 19.9 (*)    Neutro Abs 28.3 (*)    Lymphs Abs 0.2 (*)    Abs Immature Granulocytes 0.46 (*)    All other components within normal limits  URINALYSIS, ROUTINE W REFLEX MICROSCOPIC - Abnormal; Notable for the following components:   APPearance CLOUDY (*)    Hgb urine dipstick SMALL (*)    Protein, ur 100 (*)    Nitrite POSITIVE (*)    Leukocytes,Ua LARGE (*)    WBC, UA >50 (*)    Bacteria, UA MANY (*)    All other components within normal limits  IRON AND TIBC - Abnormal; Notable for the following components:   Iron 9 (*)    TIBC 153 (*)    Saturation Ratios 6 (*)    All  other components within normal limits  FERRITIN - Abnormal; Notable for the following components:   Ferritin 1,033 (*)    All other components within normal limits  RETICULOCYTES - Abnormal; Notable for the following components:   RBC. 2.14 (*)    Immature Retic Fract 20.8 (*)    All other components within normal limits  PHOSPHORUS - Abnormal; Notable for  the following components:   Phosphorus 8.8 (*)    All other components within normal limits  MAGNESIUM - Abnormal; Notable for the following components:   Magnesium 1.4 (*)    All other components within normal limits  URIC ACID - Abnormal; Notable for the following components:   Uric Acid, Serum 8.7 (*)    All other components within normal limits  PROTIME-INR - Abnormal; Notable for the following components:   Prothrombin Time 18.3 (*)    INR 1.5 (*)    All other components within normal limits  D-DIMER, QUANTITATIVE - Abnormal; Notable for the following components:   D-Dimer, Quant 10.97 (*)    All other components within normal limits  FIBRINOGEN - Abnormal; Notable for the following components:   Fibrinogen >800 (*)    All other components within normal limits  RESP PANEL BY RT-PCR (FLU A&B, COVID) ARPGX2  URINE CULTURE  VITAMIN B12  FOLATE  LACTATE DEHYDROGENASE  HAPTOGLOBIN  HIV ANTIBODY (ROUTINE TESTING W REFLEX)  RENAL FUNCTION PANEL  PATHOLOGIST SMEAR REVIEW  TECHNOLOGIST SMEAR REVIEW  CBC  I-STAT VENOUS BLOOD GAS, ED  PREPARE RBC (CROSSMATCH)  ABO/RH  TYPE AND SCREEN    EKG EKG Interpretation  Date/Time:  Thursday November 13 2020 19:29:21 EDT Ventricular Rate:  114 PR Interval:  170 QRS Duration: 88 QT Interval:  332 QTC Calculation: 457 R Axis:   74 Text Interpretation: Sinus tachycardia Otherwise normal ECG Prolonged QT Confirmed by Noemi Chapel [phone]) on 11/13/2020 10:13:30 PM  Radiology CT ABDOMEN PELVIS WO CONTRAST  Result Date: 11/13/2020 CLINICAL DATA:  Diarrhea swelling over proximal  right clavicle EXAM: CT CHEST, ABDOMEN AND PELVIS WITHOUT CONTRAST TECHNIQUE: Multidetector CT imaging of the chest, abdomen and pelvis was performed following the standard protocol without IV contrast. COMPARISON:  CT 06/25/2012 FINDINGS: CT CHEST FINDINGS Cardiovascular: Limited evaluation without intravenous contrast. Aorta is nonaneurysmal. Normal cardiac size. No pericardial effusion. Mediastinum/Nodes: Midline trachea. No thyroid mass. No suspicious nodes. Esophagus normal Lungs/Pleura: Lungs are clear. No pleural effusion or pneumothorax. Musculoskeletal: Mild edema and soft tissue swelling/thickening centered at the right sternoclavicular joint. Irregular lucency within the head of the right clavicle. Similar but less extensive changes involving head of left clavicle. Focal heterogeneous lucency within the right first rib, series 4, image 34. Bones appear diffusely dense CT ABDOMEN PELVIS FINDINGS Hepatobiliary: No focal liver abnormality is seen. No gallstones, gallbladder wall thickening, or biliary dilatation. Pancreas: Unremarkable. No pancreatic ductal dilatation or surrounding inflammatory changes. Spleen: Normal in size without focal abnormality. Adrenals/Urinary Tract: Adrenal glands are normal. Status post right nephrectomy. Severe left-sided hydronephrosis and hydroureter with tortuous left ureter dilated down to the level of the bladder. Dilated right ureteral remnant with cystic enlargement of the distal right ureter. Bladder is unremarkable. Stomach/Bowel: Stomach nonenlarged. No dilated small bowel. Negative appendix. No acute bowel wall thickening Vascular/Lymphatic: Nonaneurysmal aorta.  No suspicious nodes Reproductive: Prostate is unremarkable. Other: Negative for pelvic effusion or free air Musculoskeletal: Bones appear diffusely increased in density suggesting metabolic bone disease, possibly due to chronic kidney disease. 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 Interval resorptive  changes at bilateral SI joints with some sclerosis. No inflammatory changes by CT. IMPRESSION: 1. Edema and soft tissue thickening centered the right sternoclavicular joint with heterogeneous lucency involving the head of the right clavicle. Similar changes involving head of left clavicle though less extensive. Findings could be secondary to osteomyelitis and infection with bone tumor an additional consideration. Further evaluation with MRI could  be considered. Additional heterogeneous focal indeterminate lucent lesion in the right first rib posteriorly. 2. Status post right nephrectomy. Severe left-sided hydronephrosis and hydroureter down to the level of the bladder. Interval cortical atrophy of left kidney. There is right ureteral remnant which is also dilated with cystic change distally. Electronically Signed   By: Donavan Foil M.D.   On: 11/13/2020 23:11   CT Chest Wo Contrast  Result Date: 11/13/2020 CLINICAL DATA:  Diarrhea swelling over proximal right clavicle EXAM: CT CHEST, ABDOMEN AND PELVIS WITHOUT CONTRAST TECHNIQUE: Multidetector CT imaging of the chest, abdomen and pelvis was performed following the standard protocol without IV contrast. COMPARISON:  CT 06/25/2012 FINDINGS: CT CHEST FINDINGS Cardiovascular: Limited evaluation without intravenous contrast. Aorta is nonaneurysmal. Normal cardiac size. No pericardial effusion. Mediastinum/Nodes: Midline trachea. No thyroid mass. No suspicious nodes. Esophagus normal Lungs/Pleura: Lungs are clear. No pleural effusion or pneumothorax. Musculoskeletal: Mild edema and soft tissue swelling/thickening centered at the right sternoclavicular joint. Irregular lucency within the head of the right clavicle. Similar but less extensive changes involving head of left clavicle. Focal heterogeneous lucency within the right first rib, series 4, image 34. Bones appear diffusely dense CT ABDOMEN PELVIS FINDINGS Hepatobiliary: No focal liver abnormality is seen. No  gallstones, gallbladder wall thickening, or biliary dilatation. Pancreas: Unremarkable. No pancreatic ductal dilatation or surrounding inflammatory changes. Spleen: Normal in size without focal abnormality. Adrenals/Urinary Tract: Adrenal glands are normal. Status post right nephrectomy. Severe left-sided hydronephrosis and hydroureter with tortuous left ureter dilated down to the level of the bladder. Dilated right ureteral remnant with cystic enlargement of the distal right ureter. Bladder is unremarkable. Stomach/Bowel: Stomach nonenlarged. No dilated small bowel. Negative appendix. No acute bowel wall thickening Vascular/Lymphatic: Nonaneurysmal aorta.  No suspicious nodes Reproductive: Prostate is unremarkable. Other: Negative for pelvic effusion or free air Musculoskeletal: Bones appear diffusely increased in density suggesting metabolic bone disease, possibly due to chronic kidney disease. 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 Interval resorptive changes at bilateral SI joints with some sclerosis. No inflammatory changes by CT. IMPRESSION: 1. Edema and soft tissue thickening centered the right sternoclavicular joint with heterogeneous lucency involving the head of the right clavicle. Similar changes involving head of left clavicle though less extensive. Findings could be secondary to osteomyelitis and infection with bone tumor an additional consideration. Further evaluation with MRI could be considered. Additional heterogeneous focal indeterminate lucent lesion in the right first rib posteriorly. 2. Status post right nephrectomy. Severe left-sided hydronephrosis and hydroureter down to the level of the bladder. Interval cortical atrophy of left kidney. There is right ureteral remnant which is also dilated with cystic change distally. Electronically Signed   By: Donavan Foil M.D.   On: 11/13/2020 23:11   DG Chest Portable 1 View  Result Date: 11/13/2020 CLINICAL DATA:  Evaluate proximal right  clavicular head. EXAM: PORTABLE CHEST 1 VIEW COMPARISON:  None. FINDINGS: No gross abnormality of the proximal right clavicular head is evident within the limitations of a single portable frontal radiograph of the chest. Low lung volumes with streaky opacities favoring atelectatic change. No consolidative process. No pneumothorax or layering effusion. The cardiomediastinal contours are unremarkable. No acute osseous or soft tissue abnormality. Telemetry leads overlie the chest. IMPRESSION: No gross abnormality of the proximal right clavicular head though evaluation is limited on this single view portable chest radiograph. Could consider dedicated clavicular radiographs or cross-sectional imaging if there is persisting clinical concern. Electronically Signed   By: Lovena Le M.D.   On:  11/13/2020 22:36    Procedures Procedures   Medications Ordered in ED Medications  0.9 %  sodium chloride infusion (has no administration in time range)  cefTRIAXone (ROCEPHIN) 1 g in sodium chloride 0.9 % 100 mL IVPB (0 g Intravenous Stopped 11/13/20 2323)  lactated ringers infusion (has no administration in time range)  sodium chloride flush (NS) 0.9 % injection 3 mL (3 mLs Intravenous Given 11/13/20 2358)  acetaminophen (TYLENOL) tablet 650 mg (has no administration in time range)    Or  acetaminophen (TYLENOL) suppository 650 mg (has no administration in time range)  polyethylene glycol (MIRALAX / GLYCOLAX) packet 17 g (has no administration in time range)   ED Course  I have reviewed the triage vital signs and the nursing notes.  Pertinent labs & imaging results that were available during my care of the patient were reviewed by me and considered in my medical decision making (see chart for details).    MDM Rules/Calculators/A&P                           36 year old male with a past medical history of unilateral single kidney presenting to the emergency department for 3 weeks of fatigue and unintentional  weight loss.  Vital signs reviewed, slightly tachycardic.  Physical exam is notable for an ill-appearing young male, pale conjunctiva.  No abdominal tenderness to palpation.  Differential includes infection, malignancy.  Labs obtained in triage reviewed.  CBC is notable for a leukocytosis with a marked left shift, as well as some immature granulocytes.  Anemia of 6.  Metabolic panel most notable for a potassium of 4.1, bicarb of 10, creatinine of 12 with a BUN of 111.  Will obtain a urine sample.  Due to patient's generalized symptoms, concerned that his presentation could represent a malignancy with possible tumor lysis.  Given his asymmetric focal swelling, will obtain advanced imaging of this area as well as the abdomen pelvis.  We will use no contrast.  Will obtain type and screen, I discussed the risks and benefits of blood transfusion with the patient and he would like to have a blood transfusion.  Will obtain B12, folate, iron studies, reticulocyte count, as well as an LDH and haptoglobin to look for hemolysis.  We will obtain a Phos and uric acid to look for tumor lysis syndrome.  We will give empiric volume expansion for AKI.  We will obtain an INR, D-dimer, fibrinogen as well.   Elevated fibrinogen and D-dimer.  Phosphorus of 8.8.  Uric acid of 8.7, no indication for rasburicase at this time.  We will continue fluid resuscitation.  Urine study shows positive nitrates and bacteria concerning for urinary tract infection, will give ceftriaxone.  CT scan shows asymmetric abnormality of the right clavicular head, osteomyelitis versus malignancy.  CT scan also shows left-sided hydroureter with significant hydronephrosis.  Patient could have an obstructive uropathy leading to systemic infection with seeding of the clavicle, but could still be malignancy on differential.  Will place a Foley catheter and admit the patient to the hospital.  Handoff given to admitting team.   Final Clinical Impression(s) / ED  Diagnoses Final diagnoses:  AKI (acute kidney injury) ([place])  Anemia, unspecified type    Rx / DC Orders ED Discharge Orders     None        Claud Kelp, MD 11/14/20 Adelfa Koh    Noemi Chapel, MD 11/14/20 1445

## 2020-11-13 NOTE — ED Triage Notes (Signed)
Pt presents with headache, lightheadedness, chest tightness, and weakness xs 3-4 days. States has has multiple episodes of nausea and diarrhea.   States has lost 20 pounds within the last 2 months.

## 2020-11-13 NOTE — ED Notes (Signed)
Together with Alexa NT, tried placing foley catheter on this pt, however would not advance/ felt resistance - Dr. Florene Glen made aware.

## 2020-11-13 NOTE — ED Provider Notes (Signed)
HPI  SUBJECTIVE:  Micheal Bean is a 35 y.o. male who presents with 3 to 4 days of headaches, generalized weakness, shortness of breath and lightheadedness.  He has had intermittent, substernal chest pain described as tightness that occasionally radiates up his neck for the past month.  He reports watery, nonbloody diarrhea, nausea, 20 pound weight loss in the past month.  He reports decreased appetite.  He states that he has episodes where he is but feels like "I am about to pass out", but denies syncope.  No palpitations.  He has had 3 episodes of diarrhea today.  He has tried pushing fluids and he took 5 mg of his mother's amlodipine earlier today at 11:00 without improvement in his symptoms.  He feels better with lying down, worse with sitting up, moving around.  No fevers, coughing, wheezing.  No calf pain, swelling.  No abdominal pain, urinary complaints, anuria. he is status post left nephrectomy and has a history of renal insufficiency.  No history of hypertension, PE, DVT.  PMD: None.  Past Medical History:  Diagnosis Date   Renal disorder    only has right kidney    Past Surgical History:  Procedure Laterality Date   NEPHRECTOMY     childhhod, RT side    Family History  Problem Relation Age of Onset   Hypertension Mother     Social History   Tobacco Use   Smoking status: Never   Smokeless tobacco: Never  Substance Use Topics   Alcohol use: Yes    Comment: occassional   Drug use: Yes    Types: Marijuana    No current facility-administered medications for this encounter.  Current Outpatient Medications:    naproxen sodium (ALEVE) 220 MG tablet, Take 220 mg by mouth daily as needed (pain)., Disp: , Rfl:    oxyCODONE-acetaminophen (PERCOCET/ROXICET) 5-325 MG per tablet, Take 2 tablets by mouth every 4 (four) hours as needed for pain., Disp: 15 tablet, Rfl: 0   oxyCODONE-acetaminophen (PERCOCET/ROXICET) 5-325 MG tablet, Take 1 tablet by mouth every 4 (four) hours as  needed for severe pain., Disp: 12 tablet, Rfl: 0  No Known Allergies   ROS  As noted in HPI.   Physical Exam  BP (!) 97/54 (BP Location: Right Arm)   Pulse (!) 126   Temp 98.5 F (36.9 C) (Oral)   SpO2 100%   Constitutional: Well developed, well nourished, pale, diaphoretic.  Appears ill. Eyes:  EOMI, conjunctiva normal bilaterally HENT: Normocephalic, atraumatic,mucus membranes moist Respiratory: Normal inspiratory effort, lungs clear bilaterally Cardiovascular: Regular tachycardia, no murmurs rubs or gallops GI: Soft, nontender, nondistended.  Active bowel sounds skin: No rash, skin intact Musculoskeletal: Calves symmetric, nontender, no edema. Neurologic: Alert & oriented x 3, no focal neuro deficits Psychiatric: Speech and behavior appropriate   ED Course   Medications  sodium chloride 0.9 % bolus 1,000 mL (1,000 mLs Intravenous New Bag/Given 11/13/20 1842)  ondansetron (ZOFRAN) injection 4 mg (4 mg Intravenous Given 11/13/20 1841)    Orders Placed This Encounter  Procedures   ED EKG    Standing Status:   Standing    Number of Occurrences:   1    Order Specific Question:   Reason for Exam    Answer:   Chest Pain   EKG 12-Lead    Standing Status:   Standing    Number of Occurrences:   1   Insert peripheral IV    Standing Status:   Standing    Number  of Occurrences:   1    No results found for this or any previous visit (from the past 24 hour(s)). No results found.  ED Clinical Impression  1. Tachycardia   2. Lightheadedness   3. Chest tightness      ED Assessment/Plan  Initial blood pressure 86/54, heart rate 126.  EKG: Sinus tachycardia, rate 124.  Normal axis, normal intervals.  No hypertrophy.  No ST-T wave changes.  Patient was having chest tightness, diaphoresis and nausea with this EKG.  Patient initially slightly hypotensive, but tachycardic. he is diaphoretic, appears pale.  He gets very lightheaded, nauseous when he sits up.  He has no  evidence of cardiac ischemia on EKG. suspect dehydration from diarrhea,  may also have an electrolyte imbalance.  He reports chest tightness and shortness of breath, but no pleuritic chest pain.  He is 100% on room air.  His abdomen is benign.  Feel that patient needs a more comprehensive work-up than can be done in the urgent care.  Starting line, fluids.  Giving Zofran 4 mg 4 mg IV.  Transferring to the ED via EMS for further evaluation.  Discussed medical decision making, EKG findings, rationale for transfer to the emergency department with patient and parent.  They agree with plan.  Meds ordered this encounter  Medications   sodium chloride 0.9 % bolus 1,000 mL   DISCONTD: ondansetron (ZOFRAN) injection 4 mg   ondansetron (ZOFRAN) injection 4 mg      *This clinic note was created using [employer]. Therefore, there may be occasional mistakes despite careful proofreading.  ?    Melynda Ripple, MD 11/13/20 [phone]

## 2020-11-14 ENCOUNTER — Observation Stay (HOSPITAL_COMMUNITY): Payer: Medicaid Other

## 2020-11-14 DIAGNOSIS — D509 Iron deficiency anemia, unspecified: Secondary | ICD-10-CM | POA: Diagnosis present

## 2020-11-14 DIAGNOSIS — Z992 Dependence on renal dialysis: Secondary | ICD-10-CM | POA: Diagnosis not present

## 2020-11-14 DIAGNOSIS — N136 Pyonephrosis: Secondary | ICD-10-CM | POA: Diagnosis present

## 2020-11-14 DIAGNOSIS — R000 Tachycardia, unspecified: Secondary | ICD-10-CM | POA: Diagnosis present

## 2020-11-14 DIAGNOSIS — R7989 Other specified abnormal findings of blood chemistry: Secondary | ICD-10-CM | POA: Diagnosis present

## 2020-11-14 DIAGNOSIS — E538 Deficiency of other specified B group vitamins: Secondary | ICD-10-CM | POA: Diagnosis present

## 2020-11-14 DIAGNOSIS — N179 Acute kidney failure, unspecified: Secondary | ICD-10-CM | POA: Diagnosis present

## 2020-11-14 DIAGNOSIS — R197 Diarrhea, unspecified: Secondary | ICD-10-CM | POA: Diagnosis present

## 2020-11-14 DIAGNOSIS — N186 End stage renal disease: Secondary | ICD-10-CM | POA: Diagnosis not present

## 2020-11-14 DIAGNOSIS — D631 Anemia in chronic kidney disease: Secondary | ICD-10-CM | POA: Diagnosis present

## 2020-11-14 DIAGNOSIS — Z905 Acquired absence of kidney: Secondary | ICD-10-CM | POA: Diagnosis not present

## 2020-11-14 DIAGNOSIS — E876 Hypokalemia: Secondary | ICD-10-CM | POA: Diagnosis present

## 2020-11-14 DIAGNOSIS — M899 Disorder of bone, unspecified: Secondary | ICD-10-CM | POA: Diagnosis present

## 2020-11-14 DIAGNOSIS — E872 Acidosis: Secondary | ICD-10-CM | POA: Diagnosis present

## 2020-11-14 DIAGNOSIS — M869 Osteomyelitis, unspecified: Secondary | ICD-10-CM

## 2020-11-14 DIAGNOSIS — N2882 Megaloureter: Secondary | ICD-10-CM | POA: Diagnosis present

## 2020-11-14 DIAGNOSIS — Z28310 Unvaccinated for covid-19: Secondary | ICD-10-CM | POA: Diagnosis not present

## 2020-11-14 DIAGNOSIS — Z8249 Family history of ischemic heart disease and other diseases of the circulatory system: Secondary | ICD-10-CM | POA: Diagnosis not present

## 2020-11-14 DIAGNOSIS — R634 Abnormal weight loss: Secondary | ICD-10-CM | POA: Diagnosis present

## 2020-11-14 DIAGNOSIS — D72829 Elevated white blood cell count, unspecified: Secondary | ICD-10-CM

## 2020-11-14 DIAGNOSIS — Z20822 Contact with and (suspected) exposure to covid-19: Secondary | ICD-10-CM | POA: Diagnosis present

## 2020-11-14 DIAGNOSIS — N35919 Unspecified urethral stricture, male, unspecified site: Secondary | ICD-10-CM | POA: Diagnosis present

## 2020-11-14 DIAGNOSIS — N390 Urinary tract infection, site not specified: Secondary | ICD-10-CM

## 2020-11-14 DIAGNOSIS — N1832 Chronic kidney disease, stage 3b: Secondary | ICD-10-CM | POA: Diagnosis present

## 2020-11-14 DIAGNOSIS — N2581 Secondary hyperparathyroidism of renal origin: Secondary | ICD-10-CM | POA: Diagnosis present

## 2020-11-14 LAB — I-STAT VENOUS BLOOD GAS, ED
Acid-base deficit: 19 mmol/L — ABNORMAL HIGH (ref 0.0–2.0)
Bicarbonate: 8.4 mmol/L — ABNORMAL LOW (ref 20.0–28.0)
Calcium, Ion: 1 mmol/L — ABNORMAL LOW (ref 1.15–1.40)
HCT: 20 % — ABNORMAL LOW (ref 39.0–52.0)
Hemoglobin: 6.8 g/dL — CL (ref 13.0–17.0)
O2 Saturation: 97 %
Potassium: 4.5 mmol/L (ref 3.5–5.1)
Sodium: 134 mmol/L — ABNORMAL LOW (ref 135–145)
TCO2: 9 mmol/L — ABNORMAL LOW (ref 22–32)
pCO2, Ven: 25.8 mmHg — ABNORMAL LOW (ref 44.0–60.0)
pH, Ven: 7.124 — CL (ref 7.250–7.430)
pO2, Ven: 120 mmHg — ABNORMAL HIGH (ref 32.0–45.0)

## 2020-11-14 LAB — RENAL FUNCTION PANEL
Albumin: 2.6 g/dL — ABNORMAL LOW (ref 3.5–5.0)
Anion gap: 20 — ABNORMAL HIGH (ref 5–15)
BUN: 117 mg/dL — ABNORMAL HIGH (ref 6–20)
CO2: 11 mmol/L — ABNORMAL LOW (ref 22–32)
Calcium: 7.8 mg/dL — ABNORMAL LOW (ref 8.9–10.3)
Chloride: 103 mmol/L (ref 98–111)
Creatinine, Ser: 12.46 mg/dL — ABNORMAL HIGH (ref 0.61–1.24)
GFR, Estimated: 5 mL/min — ABNORMAL LOW (ref >60)
Glucose, Bld: 101 mg/dL — ABNORMAL HIGH (ref 70–99)
Phosphorus: 9.7 mg/dL — ABNORMAL HIGH (ref 2.5–4.6)
Potassium: 4 mmol/L (ref 3.5–5.1)
Sodium: 134 mmol/L — ABNORMAL LOW (ref 135–145)

## 2020-11-14 LAB — BLOOD GAS, VENOUS
Acid-base deficit: 9 mmol/L — ABNORMAL HIGH (ref 0.0–2.0)
Bicarbonate: 15.8 mmol/L — ABNORMAL LOW (ref 20.0–28.0)
Drawn by: 1679
O2 Saturation: 77.2 %
Patient temperature: 37.1
pCO2, Ven: 31.1 mmHg — ABNORMAL LOW (ref 44.0–60.0)
pH, Ven: 7.326 (ref 7.250–7.430)
pO2, Ven: 45.8 mmHg — ABNORMAL HIGH (ref 32.0–45.0)

## 2020-11-14 LAB — CBC
HCT: 20.9 % — ABNORMAL LOW (ref 39.0–52.0)
Hemoglobin: 6.6 g/dL — CL (ref 13.0–17.0)
MCH: 28.9 pg (ref 26.0–34.0)
MCHC: 31.6 g/dL (ref 30.0–36.0)
MCV: 91.7 fL (ref 80.0–100.0)
Platelets: 276 10*3/uL (ref 150–400)
RBC: 2.28 MIL/uL — ABNORMAL LOW (ref 4.22–5.81)
RDW: 14.4 % (ref 11.5–15.5)
WBC: 29.8 10*3/uL — ABNORMAL HIGH (ref 4.0–10.5)
nRBC: 0 % (ref 0.0–0.2)

## 2020-11-14 LAB — PREPARE RBC (CROSSMATCH)

## 2020-11-14 MED ORDER — STERILE WATER FOR INJECTION IV SOLN
INTRAVENOUS | Status: AC
  Filled 2020-11-14 (×3): qty 1000

## 2020-11-14 MED ORDER — SODIUM BICARBONATE 8.4 % IV SOLN
100.0000 meq | INTRAVENOUS | Status: AC
  Administered 2020-11-14: 100 meq via INTRAVENOUS
  Filled 2020-11-14: qty 50

## 2020-11-14 MED ORDER — SODIUM CHLORIDE 0.9% IV SOLUTION: Freq: Once | INTRAVENOUS | Status: AC

## 2020-11-14 MED ORDER — SODIUM CHLORIDE 0.9 % IV SOLN
8.0000 mg/kg | INTRAVENOUS | Status: DC
  Administered 2020-11-15 – 2020-11-16 (×2): 700 mg via INTRAVENOUS
  Filled 2020-11-14 (×2): qty 14

## 2020-11-14 MED ORDER — SODIUM CHLORIDE 0.9 % IV SOLN
2.0000 g | INTRAVENOUS | Status: DC
  Administered 2020-11-14 – 2020-11-16 (×3): 2 g via INTRAVENOUS
  Filled 2020-11-14 (×2): qty 20

## 2020-11-14 MED ORDER — VITAMIN B-12 1000 MCG PO TABS
1000.0000 ug | ORAL_TABLET | Freq: Every day | ORAL | Status: DC
  Administered 2020-11-14 – 2020-11-24 (×11): 1000 ug via ORAL
  Filled 2020-11-14 (×11): qty 1

## 2020-11-14 MED ORDER — CHLORHEXIDINE GLUCONATE CLOTH 2 % EX PADS
6.0000 | MEDICATED_PAD | Freq: Every day | CUTANEOUS | Status: DC
  Administered 2020-11-20 – 2020-11-23 (×3): 6 via TOPICAL

## 2020-11-14 NOTE — ED Notes (Signed)
Attempted to place coude catheter at this time but resistance still felt, Dr. Trilby Drummer made aware.

## 2020-11-14 NOTE — ED Notes (Signed)
patient was informed that we need a stool sample when he can.

## 2020-11-14 NOTE — ED Notes (Signed)
Patient denies pain and is resting comfortably.  

## 2020-11-14 NOTE — ED Notes (Signed)
Ultrasound is at bedside for DVT study.

## 2020-11-14 NOTE — Progress Notes (Signed)
Received a call from bedside RN regarding difficulty inserting coude Foley catheter.    Reviewed the chart.  Patient is status post right nephrectomy.  Severe left-sided hydronephrosis seen on CT scan.    Presented with significant AKI with creatinine of 12.59 with baseline creatinine of 2.0 from previous record dating back in 2014.  Unable to void.  Also presented with severe metabolic acidosis in the setting of acute renal insufficiency.  Serum bicarb of 10 and pH of 7.124 on venous blood gas.  2 A of bicarb ordered and isotonic bicarb drip ordered to be administered at 50 cc/h x 1 day.  Consulted urology, Dr. Milford Cage, to assist with Foley catheter placement.  Highly appreciate urology's assistance.  The plan has been communicated to the oncoming bedside RN.

## 2020-11-14 NOTE — ED Notes (Signed)
Floor nurse is unable to take report at this time. They will call back when available.

## 2020-11-14 NOTE — ED Notes (Signed)
Patient is heading to the floor at this time

## 2020-11-14 NOTE — Consult Note (Addendum)
[facility]                                                                                        Patient Identification: Patient Name: Micheal Bean MRN: [id number] [place] Date: 11/13/2020  7:21 PM Today's Date: 11/14/2020 Reason for consult:  Requesting provider:   Principal Problem:   Acute renal failure (ARF) ([place]) Active Problems:   Acute lower UTI   Leukocytosis   Bone lesion   Antibiotics: Ceftriaxone 8/11-current   Lines/Tubes: Urinary catheter, PIVs   Assessment # Edema and soft tissue thickening at the right sternoclavicular joint with heterogeneous lucency involving the head of the right clavicle/similar changes involving head of left clavicle: - Differentials could be 2/2 osteomyelitis vs bone tumor  # B symptoms +  # Severe left-sided hydroureteronephrosis. UA concerning for UTI. 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 Add Daptomycin for concerns for osteomyelitis while work up in progress as patient is septic 4. Primary has already consulted CT surgery for possible need to biopsy about abnormal findings seen in the CT chest. Need to r/o malignancy given B symptoms. Will defer need of further imaging to them  5. Monitor CBC, BMP 6. Follow up blood cultures and CT surgery recs 7. HIV and HCV for screening  8. [facility] for Hydroureteronephrosis.  Dr Juleen China available over the weekend with questions. Otherwise, new ID team will follow on Monday   Rest of the management as per the primary team. Please call with questions or concerns.  Thank you for the consult  Rosiland Oz, MD Infectious Disease Physician [facility] [address] 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  History taken with the help of patient, mother and fianc and chart review.  Patient tells he was at his baseline approximately 1 and half months ago when he started feeling poorly.  His appetite was poor and he lost approximately 20 pounds in the last 1 and half months.  He did not have any fevers/chills recently but tells me he had sweats at night.  Denies any cough or shortness of breath but complains of chest tightness that radiates up to his neck.  Has nausea, but denies any vomiting abdominal pain or diarrhea.  He has difficulty having urine but denies any burning urination or hematuria.  Denies any back pain or peripheral joint or swelling.  Denies any rashes.  Denies any recent travel or sick contacts.  He has not been vaccinated for COVID.  He lives with his  mother.  Smokes marijuana, denies alcohol or IVDU.  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  He has 2 dogs at home.  Denies any prior allergies.  His grandmother had?  Right malignancy.  At ED, he was afebrile but had leukocytosis of 29.8.  Creatinine of 12.59 elevated from his baseline.HB  6 Imaging findings as below Started on IVF and abtx.   ROS: General- fever, chills, loss of appetite and loss of weight+ HEENT - headache, +, Denies  blurry vision, neck pain, sinus pain Chest - Denies any chest pain, SOB or cough, chest  tightness+ CVS- Dizziness/lightheadedness,  palpitations + Abdomen- Nausea+, Denies any vomiting, abdominal pain, hematochezia and diarrhea Neuro - Denies any weakness, numbness, tingling sensation Psych - Denies any changes in mood irritability or depressive symptoms GU- Denies any burning, dysuria, hematuria or increased frequency of urination Skin - denies any rashes/lesions MSK - denies any joint pain/swelling or restricted ROM   Past Medical History:  Diagnosis Date   Renal disorder    only has right kidney   Past Surgical History:  Procedure Laterality Date   NEPHRECTOMY     childhhod, RT side     Scheduled Meds:  sodium chloride   Intravenous Once   sodium chloride flush  3 mL Intravenous Q12H   Continuous Infusions:  sodium chloride     cefTRIAXone (ROCEPHIN)  IV Stopped (11/13/20 2323)    sodium bicarbonate (isotonic) infusion in sterile water 100 mL/hr at 11/14/20 0906   PRN Meds:.acetaminophen **OR** acetaminophen, polyethylene glycol  No Known Allergies  Social History   Socioeconomic History   Marital status: Single    Spouse name: Not on file   Number of children: Not on file   Years of education: Not on file   Highest education level: Not on file  Occupational History   Not on file  Tobacco Use   Smoking status: Never   Smokeless tobacco: Never  Substance and Sexual Activity   Alcohol use: Yes    Comment: occassional   Drug use: Yes    Types: Marijuana   Sexual activity: Not on file  Other Topics Concern   Not on file  Social History Narrative   Not on file   Social Determinants of Health   Financial Resource Strain: Not on file  Food Insecurity: Not on file  Transportation Needs: Not on file  Physical Activity: Not on file  Stress: Not on file  Social Connections: Not on file  Intimate Partner Violence: Not on file   Breast Cancer-relatedfamily history is not on file. '  Vitals BP 117/65 (BP Location: Right Arm)   Pulse 99    Temp 98.6 F (37 C) (Oral)   Resp 16   Ht '6\' 3"'$  (1.905 m)   Wt 86.2 kg   SpO2 100%   BMI 23.75 kg/m    Physical Exam Constitutional: Acutely sick looking male lying in the bed    Comments:   Cardiovascular:     Rate and Rhythm: Normal rate and regular rhythm.     Heart sounds:   Pulmonary:     Effort: Pulmonary effort is normal.     Comments: on room air  Abdominal:     Palpations: Abdomen is soft.     Tenderness: Nontender and nondistended  Musculoskeletal:        General: No swelling or tenderness.  Right chest appears mildly tender  Skin:    Comments: No lesions or rashes  Neurological:     General: No focal deficit present.   Psychiatric:        Mood and Affect: Mood normal.  Calm and cooperative   Pertinent Microbiology Results for orders placed or performed during the hospital encounter of 11/13/20  Resp Panel by RT-PCR (Flu A&B, Covid) Nasopharyngeal Swab     Status: None   Collection Time: 11/13/20  7:30 PM   Specimen: Nasopharyngeal Swab; Nasopharyngeal(NP) swabs in vial transport medium  Result Value Ref Range Status   SARS Coronavirus 2 by RT PCR NEGATIVE NEGATIVE Final    Comment: (NOTE) SARS-CoV-2 target nucleic acids are NOT DETECTED.  The SARS-CoV-2 RNA is generally detectable in upper respiratory specimens during the acute phase of infection. The lowest concentration of SARS-CoV-2 viral copies this assay can detect is 138 copies/mL. A negative result does not preclude SARS-Cov-2 infection and should not be used as the sole basis for treatment or other patient management decisions. A negative result may occur with  improper specimen collection/handling, submission of specimen other than nasopharyngeal swab, presence of viral mutation(s) within the areas targeted by this assay, and inadequate number of viral copies(<138 copies/mL). A negative result must be combined with clinical observations, patient history, and epidemiological information.  The expected result is Negative.  Fact Sheet for Patients:  EntrepreneurPulse.com.au  Fact Sheet for Healthcare Providers:  IncredibleEmployment.be  This test is no t yet approved or cleared by the [place] FDA and  has been authorized for detection and/or diagnosis of SARS-CoV-2 by FDA under an Emergency Use Authorization (EUA). This EUA will remain  in effect (meaning this test can be used) for the duration of the COVID-19 declaration under Section 564(b)(1) of the Act, 21 U.S.C.section 360bbb-3(b)(1), unless the authorization is terminated  or revoked sooner.       Influenza A by PCR NEGATIVE NEGATIVE Final   Influenza B by PCR NEGATIVE NEGATIVE Final    Comment: (NOTE) The Xpert Xpress SARS-CoV-2/FLU/RSV plus assay is intended as an aid in the diagnosis of influenza from Nasopharyngeal swab specimens and should not be used as a sole basis for treatment. Nasal washings and aspirates are unacceptable for Xpert Xpress SARS-CoV-2/FLU/RSV testing.  Fact Sheet for Patients: EntrepreneurPulse.com.au  Fact Sheet for Healthcare Providers: IncredibleEmployment.be  This test is not yet approved or cleared by the [place] FDA and has been authorized for detection and/or diagnosis of SARS-CoV-2 by FDA under an Emergency Use Authorization (EUA). This EUA will remain in effect (meaning this test can be used) for the duration of the COVID-19 declaration under Section 564(b)(1) of the Act, 21 U.S.C. section 360bbb-3(b)(1), unless the authorization is terminated or revoked.  Performed at [facility], [place] [address], [place], [place] [address]       Pertinent Lab seen by me: CBC Latest Ref Rng & Units 11/14/2020 11/14/2020 11/13/2020  WBC 4.0 - 10.5 K/uL 29.8(H) - 29.9(H)  Hemoglobin 13.0 - 17.0 g/dL 6.6(LL) 6.8(LL) 6.0(LL)  Hematocrit 39.0 - 52.0 % 20.9(L) 20.0(L) 19.9(L)  Platelets 150 - 400  K/uL 276 - 324   CMP Latest Ref Rng & Units 11/14/2020 11/14/2020 11/13/2020  Glucose 70 - 99 mg/dL 101(H) - 118(H)  BUN 6 - 20 mg/dL 117(H) - 111(H)  Creatinine 0.61 - 1.24 mg/dL 12.46(H) - 12.59(H)  Sodium 135 - 145 mmol/L 134(L) 134(L) 131(L)  Potassium 3.5 - 5.1 mmol/L 4.0 4.5 4.1  Chloride 98 - 111 mmol/L 103 - 106  CO2 22 - 32 mmol/L 11(L) -  10(L)  Calcium 8.9 - 10.3 mg/dL 7.8(L) - 8.0(L)  Total Protein 6.5 - 8.1 g/dL - - 7.2  Total Bilirubin 0.3 - 1.2 mg/dL - - 0.2(L)  Alkaline Phos 38 - 126 U/L - - 112  AST 15 - 41 U/L - - 13(L)  ALT 0 - 44 U/L - - 9     Pertinent Imagings/Other Imagings Plain films and CT images have been personally visualized and interpreted; radiology reports have been reviewed. Decision making incorporated into the Impression / Recommendations.  CT chest abdomen pelvis WO contrast 11/13/20 IMPRESSION: 1. Edema and soft tissue thickening centered the right sternoclavicular joint with heterogeneous lucency involving the head of the right clavicle. Similar changes involving head of left clavicle though less extensive. Findings could be secondary to osteomyelitis and infection with bone tumor an additional consideration. Further evaluation with MRI could be considered. Additional heterogeneous focal indeterminate lucent lesion in the right first rib posteriorly. 2. Status post right nephrectomy. Severe left-sided hydronephrosis and hydroureter down to the level of the bladder. Interval cortical atrophy of left kidney. There is right ureteral remnant which is also dilated with cystic change distally.   Chest Xray 11/13/20 FINDINGS: No gross abnormality of the proximal right clavicular head is evident within the limitations of a single portable frontal radiograph of the chest. Low lung volumes with streaky opacities favoring atelectatic change. No consolidative process. No pneumothorax or layering effusion. The cardiomediastinal contours are unremarkable.  No acute osseous or soft tissue abnormality. Telemetry leads overlie the chest.   IMPRESSION: No gross abnormality of the proximal right clavicular head though evaluation is limited on this single view portable chest radiograph. Could consider dedicated clavicular radiographs or cross-sectional imaging if there is persisting clinical concern. I spent more than 70  minutes for this patient encounter including review of prior medical records/discussing diagnostics and treatment plan with the patient/family/coordinate care with primary/other specialits with greater than 50% of time in face to face encounter.   Electronically signed by:   Rosiland Oz, MD Infectious Disease Physician [facility] for Infectious Disease Pager: [phone]

## 2020-11-14 NOTE — ED Notes (Signed)
Urology cart placed at bedside

## 2020-11-14 NOTE — Progress Notes (Signed)
PROGRESS NOTE    Micheal Bean  [id number] DOB: Apr 05, 1986 DOA: 11/13/2020 PCP: Patient, No Pcp Per (Inactive)    Chief Complaint  Patient presents with   Dizziness    Brief Narrative:  Micheal Bean is Micheal Bean 35 y.o. male with medical history significant of history of right nephrectomy as [name] infant presenting with constellation of intermittent symptoms including headache, lightheadedness and chest tightness, As above has had 3-4 days of head ache, lightheadedness, chest tightness, nausea, diarrhea.  He tried taking some fluids at home did seem to help he also tried taking [name] dose of his mother's amlodipine but did not seem to help.  He does feel better laying down but it feels worse standing up.  He does note losing 20 pounds in the last 2 months.  He was seen in urgent care and then sent to the ED for further evaluation.  He's been admitted for aki on ckd, left sided hydro with possible UTI, severe anemia, and leukocytosis.  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 - urology c/s, appreciate recommendations - foley - may need cystoscopy/retrograde study if creatinine not improving/hydro not improving > Nephrology consulted, appreciate recs - per renal, unclear hpw much improvement possible, may represent CKD progression - if no improvement by Monday, plan on dialysis - SPEP, UPEP, free light chains - continue bicarb - Appreciate nephrology recommendations - Continue with catheter placement - Trend renal function and electrolytes   Iron Deficiency Anemia  Anemia of Chronic  Disease > Noted to have significant anemia with [name] hemoglobin of 6. > iron deficiency, elevated ferritin - b12 and folate low normal - follow MMA, supplement b12/folate for now - will need iron supplementation - LDH wnl, haptoglobin pending - suspect related to iron def anemia and his kidney disease - no hx dark or blood stool - Check FOBT  - s/p 1 unit pRBC, inappropriate response - additional 2 units ordered - follow smear  Leukocytosis  Edema and Soft Tissue Thickening around R [name]  Urinary Tract Infection Concern for UTI, but edema and soft tissue thickening around [name] with heterogenous lucency involving head of R clavicle (similar changes involving head of L clavicle and focal indeterminate lucent lesion in R first rib) --- concerning for infection/osteo Currently on ceftriaxone to cover for UTI (UA with squams, but also nitrites) Urine culture pending Follow blood cultures (1 of 2 sets collected) ID recommending dapto/ceftriaxone CT surgery c/s to consider aspiration of [name], they did not feel any evidence of drainable fluid collection and recommended medical management  Edema and soft tissue thickening around R sternoclavicular joint with heterogenous lucency involving the head of the R clavicle with similar changes involving head of L clavicle  Indeterminate lucent lesion in R first rib posteriorly Concerning for infection as noted above, malignancy also on differential Will consider imaging with MRI   Elevated D Dimer Suspect related to inflammation  Negative LE [place] for DVT Consider VQ scan, but no evidence of hypoxia, no CP - suspect dimer related  to other systemic issues, but low threshold for further w/u    DVT prophylaxis: SCD Code Status: full  Family Communication: none at bedside Disposition:   Status is: Observation  The patient will require care spanning > 2 midnights and should be moved to inpatient because: Inpatient level of care appropriate  due to severity of illness  Dispo: The patient is from: Home              Anticipated d/c is to: Home              Patient currently is not medically stable to d/c.   Difficult to place patient No       Consultants:  Nephrology CT surgery Urology ID  Procedures:  LE [place] Summary:  BILATERAL:  - No evidence of deep vein thrombosis seen in the lower extremities,  bilaterally.  - No evidence of superficial venous thrombosis in the lower extremities,  bilaterally.  -No evidence of popliteal cyst, bilaterally.   Antimicrobials: Anti-infectives (From admission, onward)    Start     Dose/Rate Route Frequency Ordered Stop   11/13/20 2230  cefTRIAXone (ROCEPHIN) 1 g in sodium chloride 0.9 % 100 mL IVPB        1 g 200 mL/hr over 30 Minutes Intravenous Every 24 hours 11/13/20 2223            Subjective: No new complaints  Objective: Vitals:   11/14/20 0730 11/14/20 0800 11/14/20 0845 11/14/20 0904  BP: 96/63 113/77 121/83 117/65  Pulse: 90 93 100 99  Resp:    16  Temp:    98.6 F (37 C)  TempSrc:    Oral  SpO2: 100% 100% 100% 100%  Weight:    86.2 kg  Height:    '6\' 3"'$  (1.905 m)    Intake/Output Summary (Last 24 hours) at 11/14/2020 1119 Last data filed at 11/14/2020 0416 Gross per 24 hour  Intake --  Output 900 ml  Net -900 ml   Filed Weights   11/14/20 0904  Weight: 86.2 kg    Examination:  General exam: Appears calm and comfortable  Respiratory system: Clear to auscultation. Respiratory effort normal. Cardiovascular system: S1 & S2 heard, RRR. Gastrointestinal system: Abdomen is nondistended, soft and nontender. Central nervous system: Alert and oriented. No focal neurological deficits. Extremities: no LEE Skin: No rashes, lesions or ulcers Psychiatry: Judgement and insight appear normal. Mood & affect appropriate.     Data Reviewed: I have personally reviewed following labs and imaging studies  CBC: Recent Labs  Lab 11/13/20 1951  11/14/20 0000 11/14/20 0542  WBC 29.9*  --  29.8*  NEUTROABS 28.3*  --   --   HGB 6.0* 6.8* 6.6*  HCT 19.9* 20.0* 20.9*  MCV 93.9  --  91.7  PLT 324  --  [id number]    Basic Metabolic Panel: Recent Labs  Lab 11/13/20 1951 11/13/20 2143 11/14/20 0000 11/14/20 0500  NA 131*  --  134* 134*  K 4.1  --  4.5 4.0  CL 106  --   --  103  CO2 10*  --   --  11*  GLUCOSE 118*  --   --  101*  BUN 111*  --   --  117*  CREATININE 12.59*  --   --  12.46*  CALCIUM 8.0*  --   --  7.8*  MG  --  1.4*  --   --   PHOS  --  8.8*  --  9.7*    GFR: Estimated Creatinine Clearance: 10.2 mL/min (Micheal Bean) (by C-G formula based on SCr of 12.46 mg/dL (H)).  Liver Function Tests: Recent Labs  Lab 11/13/20 1951 11/14/20 0500  AST 13*  --   ALT 9  --   ALKPHOS 112  --   BILITOT 0.2*  --   PROT 7.2  --   ALBUMIN 2.7* 2.6*    CBG: No results for input(s): GLUCAP in the last 168 hours.   Recent Results (from the past 240 hour(s))  Resp Panel by RT-PCR (Flu Yachet Mattson&B, Covid) Nasopharyngeal Swab     Status: None   Collection Time: 11/13/20  7:30 PM   Specimen: Nasopharyngeal Swab; Nasopharyngeal(NP) swabs in vial transport medium  Result Value Ref Range Status   SARS Coronavirus 2 by RT PCR NEGATIVE NEGATIVE Final    Comment: (NOTE) SARS-CoV-2 target nucleic acids are NOT DETECTED.  The SARS-CoV-2 RNA is generally detectable in upper respiratory specimens during the acute phase of infection. The lowest concentration of SARS-CoV-2 viral copies this assay can detect is 138 copies/mL. Micheal Bean negative result does not preclude SARS-Cov-2 infection and should not be used as the sole basis for treatment or other patient management decisions. Micheal Bean negative result may occur with  improper specimen collection/handling, submission of specimen other than nasopharyngeal swab, presence of viral mutation(s) within the areas targeted by this assay, and inadequate number of viral copies(<138 copies/mL). Micheal Bean negative result must be  combined with clinical observations, patient history, and epidemiological information. The expected result is Negative.  Fact Sheet for Patients:  EntrepreneurPulse.com.au  Fact Sheet for Healthcare Providers:  IncredibleEmployment.be  This test is no t yet approved or cleared by the [place] FDA and  has been authorized for detection and/or diagnosis of SARS-CoV-2 by FDA under an Emergency Use Authorization (EUA). This EUA will remain  in effect (meaning this test can be used) for the duration of the COVID-19 declaration under Section 564(b)(1) of the Act, 21 U.S.C.section 360bbb-3(b)(1), unless the authorization is terminated  or revoked sooner.       Influenza Carrera Kiesel by PCR NEGATIVE NEGATIVE Final   Influenza B by PCR NEGATIVE NEGATIVE Final    Comment: (NOTE) The Xpert Xpress SARS-CoV-2/FLU/RSV plus assay is intended as an aid in the diagnosis of influenza from Nasopharyngeal swab specimens and should not be used as Micheal Bean sole basis for treatment. Nasal washings and aspirates are unacceptable for Xpert Xpress SARS-CoV-2/FLU/RSV testing.  Fact Sheet for Patients: EntrepreneurPulse.com.au  Fact Sheet for Healthcare Providers: IncredibleEmployment.be  This test is not yet approved or cleared by the [place] FDA and has been authorized for detection and/or diagnosis of SARS-CoV-2 by FDA under an Emergency Use Authorization (EUA). This EUA will remain in effect (meaning this test can be used) for the duration of the COVID-19 declaration under Section 564(b)(1) of the Act, 21 U.S.C. section 360bbb-3(b)(1), unless the authorization is terminated or revoked.  Performed at [facility], [place] [address] [place], [place] [address]          Radiology Studies: CT ABDOMEN PELVIS WO CONTRAST  Result Date: 11/13/2020 CLINICAL DATA:  Diarrhea swelling over proximal right clavicle EXAM: CT CHEST,  ABDOMEN AND PELVIS WITHOUT CONTRAST TECHNIQUE: Multidetector CT imaging of the chest, abdomen and pelvis was performed following the standard protocol without IV contrast. COMPARISON:  CT 06/25/2012 FINDINGS: CT CHEST FINDINGS Cardiovascular: Limited evaluation without intravenous contrast. Aorta is nonaneurysmal. Normal cardiac size. No pericardial effusion. Mediastinum/Nodes: Midline trachea. No thyroid  mass. No suspicious nodes. Esophagus normal Lungs/Pleura: Lungs are clear. No pleural effusion or pneumothorax. Musculoskeletal: Mild edema and soft tissue swelling/thickening centered at the right sternoclavicular joint. Irregular lucency within the head of the right clavicle. Similar but less extensive changes involving head of left clavicle. Focal heterogeneous lucency within the right first rib, series 4, image 34. Bones appear diffusely dense CT ABDOMEN PELVIS FINDINGS Hepatobiliary: No focal liver abnormality is seen. No gallstones, gallbladder wall thickening, or biliary dilatation. Pancreas: Unremarkable. No pancreatic ductal dilatation or surrounding inflammatory changes. Spleen: Normal in size without focal abnormality. Adrenals/Urinary Tract: Adrenal glands are normal. Status post right nephrectomy. Severe left-sided hydronephrosis and hydroureter with tortuous left ureter dilated down to the level of the bladder. Dilated right ureteral remnant with cystic enlargement of the distal right ureter. Bladder is unremarkable. Stomach/Bowel: Stomach nonenlarged. No dilated small bowel. Negative appendix. No acute bowel wall thickening Vascular/Lymphatic: Nonaneurysmal aorta.  No suspicious nodes Reproductive: Prostate is unremarkable. Other: Negative for pelvic effusion or free air Musculoskeletal: Bones appear diffusely increased in density suggesting metabolic bone disease, possibly due to chronic kidney disease. 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 Interval resorptive changes at bilateral SI joints  with some sclerosis. No inflammatory changes by CT. IMPRESSION: 1. Edema and soft tissue thickening centered the right sternoclavicular joint with heterogeneous lucency involving the head of the right clavicle. Similar changes involving head of left clavicle though less extensive. Findings could be secondary to osteomyelitis and infection with bone tumor an additional consideration. Further evaluation with MRI could be considered. Additional heterogeneous focal indeterminate lucent lesion in the right first rib posteriorly. 2. Status post right nephrectomy. Severe left-sided hydronephrosis and hydroureter down to the level of the bladder. Interval cortical atrophy of left kidney. There is right ureteral remnant which is also dilated with cystic change distally. Electronically Signed   By: Donavan Foil M.D.   On: 11/13/2020 23:11   CT Chest Wo Contrast  Result Date: 11/13/2020 CLINICAL DATA:  Diarrhea swelling over proximal right clavicle EXAM: CT CHEST, ABDOMEN AND PELVIS WITHOUT CONTRAST TECHNIQUE: Multidetector CT imaging of the chest, abdomen and pelvis was performed following the standard protocol without IV contrast. COMPARISON:  CT 06/25/2012 FINDINGS: CT CHEST FINDINGS Cardiovascular: Limited evaluation without intravenous contrast. Aorta is nonaneurysmal. Normal cardiac size. No pericardial effusion. Mediastinum/Nodes: Midline trachea. No thyroid mass. No suspicious nodes. Esophagus normal Lungs/Pleura: Lungs are clear. No pleural effusion or pneumothorax. Musculoskeletal: Mild edema and soft tissue swelling/thickening centered at the right sternoclavicular joint. Irregular lucency within the head of the right clavicle. Similar but less extensive changes involving head of left clavicle. Focal heterogeneous lucency within the right first rib, series 4, image 34. Bones appear diffusely dense CT ABDOMEN PELVIS FINDINGS Hepatobiliary: No focal liver abnormality is seen. No gallstones, gallbladder wall  thickening, or biliary dilatation. Pancreas: Unremarkable. No pancreatic ductal dilatation or surrounding inflammatory changes. Spleen: Normal in size without focal abnormality. Adrenals/Urinary Tract: Adrenal glands are normal. Status post right nephrectomy. Severe left-sided hydronephrosis and hydroureter with tortuous left ureter dilated down to the level of the bladder. Dilated right ureteral remnant with cystic enlargement of the distal right ureter. Bladder is unremarkable. Stomach/Bowel: Stomach nonenlarged. No dilated small bowel. Negative appendix. No acute bowel wall thickening Vascular/Lymphatic: Nonaneurysmal aorta.  No suspicious nodes Reproductive: Prostate is unremarkable. Other: Negative for pelvic effusion or free air Musculoskeletal: Bones appear diffusely increased in density suggesting metabolic bone disease, possibly due to chronic kidney disease. Chronic appearing bilateral pars defect  at L5. Interval resorptive changes at bilateral SI joints with some sclerosis. No inflammatory changes by CT. IMPRESSION: 1. Edema and soft tissue thickening centered the right sternoclavicular joint with heterogeneous lucency involving the head of the right clavicle. Similar changes involving head of left clavicle though less extensive. Findings could be secondary to osteomyelitis and infection with bone tumor an additional consideration. Further evaluation with MRI could be considered. Additional heterogeneous focal indeterminate lucent lesion in the right first rib posteriorly. 2. Status post right nephrectomy. Severe left-sided hydronephrosis and hydroureter down to the level of the bladder. Interval cortical atrophy of left kidney. There is right ureteral remnant which is also dilated with cystic change distally. Electronically Signed   By: Donavan Foil M.D.   On: 11/13/2020 23:11   DG Chest Portable 1 View  Result Date: 11/13/2020 CLINICAL DATA:  Evaluate proximal right clavicular head. EXAM: PORTABLE  CHEST 1 VIEW COMPARISON:  None. FINDINGS: No gross abnormality of the proximal right clavicular head is evident within the limitations of Jaycen Vercher single portable frontal radiograph of the chest. Low lung volumes with streaky opacities favoring atelectatic change. No consolidative process. No pneumothorax or layering effusion. The cardiomediastinal contours are unremarkable. No acute osseous or soft tissue abnormality. Telemetry leads overlie the chest. IMPRESSION: No gross abnormality of the proximal right clavicular head though evaluation is limited on this single view portable chest radiograph. Could consider dedicated clavicular radiographs or cross-sectional imaging if there is persisting clinical concern. Electronically Signed   By: Lovena Le M.D.   On: 11/13/2020 22:36   VAS [place] LOWER EXTREMITY VENOUS (DVT)  Result Date: 11/14/2020  Lower Venous DVT Study Patient Name:  Micheal Bean  Date of Exam:   11/14/2020 Medical Rec #: [id number]      Accession #:    [id number] Date of Birth: 1985/09/05      Patient Gender: M Patient Age:   51 years Exam Location:  [facility] Procedure:      VAS [place] LOWER EXTREMITY VENOUS (DVT) Referring Phys: Sheppard Coil MELVIN --------------------------------------------------------------------------------  Indications: Elevated D-dimer.  Comparison Study: No previous exams Performing Technologist: Darlin Coco  Examination Guidelines: Karron Goens complete evaluation includes B-mode imaging, spectral Doppler, color Doppler, and power Doppler as needed of all accessible portions of each vessel. Bilateral testing is considered an integral part of Jaxsyn Catalfamo complete examination. Limited examinations for reoccurring indications may be performed as noted. The reflux portion of the exam is performed with the patient in reverse Trendelenburg.  +---------+---------------+---------+-----------+----------+--------------+ RIGHT    CompressibilityPhasicitySpontaneityPropertiesThrombus Aging  +---------+---------------+---------+-----------+----------+--------------+ CFV      Full           Yes      Yes                                 +---------+---------------+---------+-----------+----------+--------------+ SFJ      Full                                                        +---------+---------------+---------+-----------+----------+--------------+ FV Prox  Full                                                        +---------+---------------+---------+-----------+----------+--------------+  FV Mid   Full                                                        +---------+---------------+---------+-----------+----------+--------------+ FV DistalFull                                                        +---------+---------------+---------+-----------+----------+--------------+ PFV      Full                                                        +---------+---------------+---------+-----------+----------+--------------+ POP      Full           Yes      Yes                                 +---------+---------------+---------+-----------+----------+--------------+ PTV      Full                                                        +---------+---------------+---------+-----------+----------+--------------+ PERO     Full                                                        +---------+---------------+---------+-----------+----------+--------------+ Gastroc  Full                                                        +---------+---------------+---------+-----------+----------+--------------+   +---------+---------------+---------+-----------+----------+--------------+ LEFT     CompressibilityPhasicitySpontaneityPropertiesThrombus Aging +---------+---------------+---------+-----------+----------+--------------+ CFV      Full           Yes      Yes                                  +---------+---------------+---------+-----------+----------+--------------+ SFJ      Full                                                        +---------+---------------+---------+-----------+----------+--------------+ FV Prox  Full                                                        +---------+---------------+---------+-----------+----------+--------------+  FV Mid   Full                                                        +---------+---------------+---------+-----------+----------+--------------+ FV DistalFull                                                        +---------+---------------+---------+-----------+----------+--------------+ PFV      Full                                                        +---------+---------------+---------+-----------+----------+--------------+ POP      Full           Yes      Yes                                 +---------+---------------+---------+-----------+----------+--------------+ PTV      Full                                                        +---------+---------------+---------+-----------+----------+--------------+ PERO     Full                                                        +---------+---------------+---------+-----------+----------+--------------+ Gastroc  Full                                                        +---------+---------------+---------+-----------+----------+--------------+     Summary: BILATERAL: - No evidence of deep vein thrombosis seen in the lower extremities, bilaterally. - No evidence of superficial venous thrombosis in the lower extremities, bilaterally. -No evidence of popliteal cyst, bilaterally.   *See table(s) above for measurements and observations.    Preliminary         Scheduled Meds:  sodium chloride   Intravenous Once   sodium chloride flush  3 mL Intravenous Q12H   Continuous Infusions:  sodium chloride     cefTRIAXone (ROCEPHIN)  IV  Stopped (11/13/20 2323)    sodium bicarbonate (isotonic) infusion in sterile water 100 mL/hr at 11/14/20 0906     LOS: 0 days    Time spent: over 30 min    Fayrene Helper, MD [facility]   To contact the attending provider between 7A-7P or the covering provider during after hours 7P-7A, please log into the web site [email or web address] and access using universal [place] password for that web site. If you do not have the password, please  call the hospital operator.  11/14/2020, 11:19 AM

## 2020-11-14 NOTE — Consult Note (Signed)
Urology Consult   Physician requesting consult: Dr Trilby Drummer  Reason for consult: Renal failure/Urinary retention/Hydronephrosis Patient a 35 year old African-American male with history of solitary left kidney.  Presented to the emergency room with dizziness and was noted to be in marked renal failure creatinine 12.59.  CT scan shows significantly enlarged urinary bladder with hydroureteronephrosis down to the bladder.  Nurses were unable to place Foley, urology asked to assist in Foley placement.  Review of the CT does show massive hydronephrosis with ureter all the way down to the urinary bladder and distended urinary bladder consistent with possible outlet obstruction of thebladder.  Patient has had no prior history of urologic surgery no history urethral stricture disease as far as he knows.  States he was voiding satisfactorily. Foley placement: Lidocaine jelly placed into the urethra.  16 coud Foley was placed.  There was resistance in the mid urethral area but I was able to advance the catheter past this area and then into the bladder quite easily.  Over 1000 cc of clear urine obtained.  10 cc of water placed in the Foley balloon.  Foley Patient is placed to gravity drain.  History of Present Illness: Micheal Bean is a 35 y.o. see above  He denies a history of voiding or storage urinary symptoms, hematuria, UTIs, STDs, urolithiasis, GU malignancy/trauma/surgery.  Past Medical History:  Diagnosis Date   Renal disorder    only has right kidney    Past Surgical History:  Procedure Laterality Date   NEPHRECTOMY     childhhod, RT side    Current Hospital Medications:  Home Meds:  No current facility-administered medications on file prior to encounter.   Current Outpatient Medications on File Prior to Encounter  Medication Sig Dispense Refill   naproxen sodium (ALEVE) 220 MG tablet Take 220 mg by mouth daily as needed (pain).     oxyCODONE-acetaminophen (PERCOCET/ROXICET) 5-325 MG per  tablet Take 2 tablets by mouth every 4 (four) hours as needed for pain. (Patient not taking: Reported on 11/13/2020) 15 tablet 0   oxyCODONE-acetaminophen (PERCOCET/ROXICET) 5-325 MG tablet Take 1 tablet by mouth every 4 (four) hours as needed for severe pain. (Patient not taking: No sig reported) 12 tablet 0     Scheduled Meds:  sodium chloride flush  3 mL Intravenous Q12H   Continuous Infusions:  sodium chloride     cefTRIAXone (ROCEPHIN)  IV Stopped (11/13/20 2323)    sodium bicarbonate (isotonic) infusion in sterile water 50 mL/hr at 11/14/20 0358   PRN Meds:.acetaminophen **OR** acetaminophen, polyethylene glycol  Allergies: No Known Allergies  Family History  Problem Relation Age of Onset   Hypertension Mother     Social History:  reports that he has never smoked. He has never used smokeless tobacco. He reports current alcohol use. He reports current drug use. Drug: Marijuana.  ROS: A complete review of systems was performed.  All systems are negative except for pertinent findings as noted.  Physical Exam:  Vital signs in last 24 hours: Temp:  [98.3 F (36.8 C)-98.8 F (37.1 C)] 98.8 F (37.1 C) (08/12 0240) Pulse Rate:  [78-126] 104 (08/12 0415) Resp:  [10-26] 18 (08/12 0240) BP: (97-141)/(54-85) 124/83 (08/12 0415) SpO2:  [98 %-100 %] 100 % (08/12 0415) Constitutional:  Alert and oriented, No acute distress   GU: No CVA tenderness Lymphatic: No lymphadenopathy Neurologic: Grossly intact, no focal deficits Psychiatric: Normal mood and affect  Laboratory Data:  Recent Labs    11/13/20 1951 11/14/20 0000  WBC  29.9*  --   HGB 6.0* 6.8*  HCT 19.9* 20.0*  PLT 324  --     Recent Labs    11/13/20 1951 11/14/20 0000  NA 131* 134*  K 4.1 4.5  CL 106  --   GLUCOSE 118*  --   BUN 111*  --   CALCIUM 8.0*  --   CREATININE 12.59*  --      Results for orders placed or performed during the hospital encounter of 11/13/20 (from the past 24 hour(s))  Resp  Panel by RT-PCR (Flu A&B, Covid) Nasopharyngeal Swab     Status: None   Collection Time: 11/13/20  7:30 PM   Specimen: Nasopharyngeal Swab; Nasopharyngeal(NP) swabs in vial transport medium  Result Value Ref Range   SARS Coronavirus 2 by RT PCR NEGATIVE NEGATIVE   Influenza A by PCR NEGATIVE NEGATIVE   Influenza B by PCR NEGATIVE NEGATIVE  Urinalysis, Routine w reflex microscopic Urine, Clean Catch     Status: Abnormal   Collection Time: 11/13/20  7:31 PM  Result Value Ref Range   Color, Urine YELLOW YELLOW   APPearance CLOUDY (A) CLEAR   Specific Gravity, Urine 1.009 1.005 - 1.030   pH 5.0 5.0 - 8.0   Glucose, UA NEGATIVE NEGATIVE mg/dL   Hgb urine dipstick SMALL (A) NEGATIVE   Bilirubin Urine NEGATIVE NEGATIVE   Ketones, ur NEGATIVE NEGATIVE mg/dL   Protein, ur 100 (A) NEGATIVE mg/dL   Nitrite POSITIVE (A) NEGATIVE   Leukocytes,Ua LARGE (A) NEGATIVE   RBC / HPF 6-10 0 - 5 RBC/hpf   WBC, UA >50 (H) 0 - 5 WBC/hpf   Bacteria, UA MANY (A) NONE SEEN   Squamous Epithelial / LPF 6-10 0 - 5   WBC Clumps PRESENT    Mucus PRESENT   Comprehensive metabolic panel     Status: Abnormal   Collection Time: 11/13/20  7:51 PM  Result Value Ref Range   Sodium 131 (L) 135 - 145 mmol/L   Potassium 4.1 3.5 - 5.1 mmol/L   Chloride 106 98 - 111 mmol/L   CO2 10 (L) 22 - 32 mmol/L   Glucose, Bld 118 (H) 70 - 99 mg/dL   BUN 111 (H) 6 - 20 mg/dL   Creatinine, Ser 12.59 (H) 0.61 - 1.24 mg/dL   Calcium 8.0 (L) 8.9 - 10.3 mg/dL   Total Protein 7.2 6.5 - 8.1 g/dL   Albumin 2.7 (L) 3.5 - 5.0 g/dL   AST 13 (L) 15 - 41 U/L   ALT 9 0 - 44 U/L   Alkaline Phosphatase 112 38 - 126 U/L   Total Bilirubin 0.2 (L) 0.3 - 1.2 mg/dL   GFR, Estimated 5 (L) >60 mL/min   Anion gap 15 5 - 15  CBC with Differential     Status: Abnormal   Collection Time: 11/13/20  7:51 PM  Result Value Ref Range   WBC 29.9 (H) 4.0 - 10.5 K/uL   RBC 2.12 (L) 4.22 - 5.81 MIL/uL   Hemoglobin 6.0 (LL) 13.0 - 17.0 g/dL   HCT 19.9 (L)  39.0 - 52.0 %   MCV 93.9 80.0 - 100.0 fL   MCH 28.3 26.0 - 34.0 pg   MCHC 30.2 30.0 - 36.0 g/dL   RDW 14.6 11.5 - 15.5 %   Platelets 324 150 - 400 K/uL   nRBC 0.0 0.0 - 0.2 %   Neutrophils Relative % 94 %   Neutro Abs 28.3 (H) 1.7 - 7.7 K/uL  Lymphocytes Relative 1 %   Lymphs Abs 0.2 (L) 0.7 - 4.0 K/uL   Monocytes Relative 3 %   Monocytes Absolute 0.9 0.1 - 1.0 K/uL   Eosinophils Relative 0 %   Eosinophils Absolute 0.0 0.0 - 0.5 K/uL   Basophils Relative 0 %   Basophils Absolute 0.0 0.0 - 0.1 K/uL   WBC Morphology MORPHOLOGY UNREMARKABLE    RBC Morphology MORPHOLOGY UNREMARKABLE    Smear Review MORPHOLOGY UNREMARKABLE    Immature Granulocytes 2 %   Abs Immature Granulocytes 0.46 (H) 0.00 - 0.07 K/uL  ABO/Rh     Status: None   Collection Time: 11/13/20  7:51 PM  Result Value Ref Range   ABO/RH(D)      B POS Performed at [facility] [address] [place], [place] [address]   Vitamin B12     Status: None   Collection Time: 11/13/20  9:43 PM  Result Value Ref Range   Vitamin B-12 249 180 - 914 pg/mL  Folate     Status: None   Collection Time: 11/13/20  9:43 PM  Result Value Ref Range   Folate 6.8 >5.9 ng/mL  Iron and TIBC     Status: Abnormal   Collection Time: 11/13/20  9:43 PM  Result Value Ref Range   Iron 9 (L) 45 - 182 ug/dL   TIBC 153 (L) 250 - 450 ug/dL   Saturation Ratios 6 (L) 17.9 - 39.5 %   UIBC 144 ug/dL  Ferritin     Status: Abnormal   Collection Time: 11/13/20  9:43 PM  Result Value Ref Range   Ferritin 1,033 (H) 24 - 336 ng/mL  Reticulocytes     Status: Abnormal   Collection Time: 11/13/20  9:43 PM  Result Value Ref Range   Retic Ct Pct 3.0 0.4 - 3.1 %   RBC. 2.14 (L) 4.22 - 5.81 MIL/uL   Retic Count, Absolute 63.8 19.0 - 186.0 K/uL   Immature Retic Fract 20.8 (H) 2.3 - 15.9 %  Lactate dehydrogenase     Status: None   Collection Time: 11/13/20  9:43 PM  Result Value Ref Range   LDH 143 98 - 192 U/L  Phosphorus     Status: Abnormal    Collection Time: 11/13/20  9:43 PM  Result Value Ref Range   Phosphorus 8.8 (H) 2.5 - 4.6 mg/dL  Magnesium     Status: Abnormal   Collection Time: 11/13/20  9:43 PM  Result Value Ref Range   Magnesium 1.4 (L) 1.7 - 2.4 mg/dL  Uric acid     Status: Abnormal   Collection Time: 11/13/20  9:43 PM  Result Value Ref Range   Uric Acid, Serum 8.7 (H) 3.7 - 8.6 mg/dL  Protime-INR     Status: Abnormal   Collection Time: 11/13/20  9:43 PM  Result Value Ref Range   Prothrombin Time 18.3 (H) 11.4 - 15.2 seconds   INR 1.5 (H) 0.8 - 1.2  D-dimer, quantitative     Status: Abnormal   Collection Time: 11/13/20  9:43 PM  Result Value Ref Range   D-Dimer, Quant 10.97 (H) 0.00 - 0.50 ug/mL-FEU  Fibrinogen     Status: Abnormal   Collection Time: 11/13/20  9:43 PM  Result Value Ref Range   Fibrinogen >800 (H) 210 - 475 mg/dL  Type and screen [place]     Status: None (Preliminary result)   Collection Time: 11/13/20 10:11 PM  Result Value Ref Range  ABO/RH(D) B POS    Antibody Screen NEG    Sample Expiration 11/16/2020,2359    Unit Number [id number]    Blood Component Type RED CELLS,LR    Unit division 00    Status of Unit ISSUED    Transfusion Status OK TO TRANSFUSE    Crossmatch Result      Compatible Performed at [facility], [address], [place], [place] [address]   Prepare RBC (crossmatch)     Status: None   Collection Time: 11/13/20 10:30 PM  Result Value Ref Range   Order Confirmation      ORDERS RECEIVED TO CROSSMATCH Performed at [facility], [place] [address] [place], [place] [address]   I-Stat venous blood gas, [facility])     Status: Abnormal   Collection Time: 11/14/20 12:00 AM  Result Value Ref Range   pH, Ven 7.124 (LL) 7.250 - 7.430   pCO2, Ven 25.8 (L) 44.0 - 60.0 mmHg   pO2, Ven 120.0 (H) 32.0 - 45.0 mmHg   Bicarbonate 8.4 (L) 20.0 - 28.0 mmol/L   TCO2 9 (L) 22 - 32 mmol/L   O2 Saturation 97.0 %   Acid-base deficit 19.0 (H) 0.0 - 2.0  mmol/L   Sodium 134 (L) 135 - 145 mmol/L   Potassium 4.5 3.5 - 5.1 mmol/L   Calcium, Ion 1.00 (L) 1.15 - 1.40 mmol/L   HCT 20.0 (L) 39.0 - 52.0 %   Hemoglobin 6.8 (LL) 13.0 - 17.0 g/dL   Sample type VENOUS    Comment NOTIFIED PHYSICIAN    Recent Results (from the past 240 hour(s))  Resp Panel by RT-PCR (Flu A&B, Covid) Nasopharyngeal Swab     Status: None   Collection Time: 11/13/20  7:30 PM   Specimen: Nasopharyngeal Swab; Nasopharyngeal(NP) swabs in vial transport medium  Result Value Ref Range Status   SARS Coronavirus 2 by RT PCR NEGATIVE NEGATIVE Final    Comment: (NOTE) SARS-CoV-2 target nucleic acids are NOT DETECTED.  The SARS-CoV-2 RNA is generally detectable in upper respiratory specimens during the acute phase of infection. The lowest concentration of SARS-CoV-2 viral copies this assay can detect is 138 copies/mL. A negative result does not preclude SARS-Cov-2 infection and should not be used as the sole basis for treatment or other patient management decisions. A negative result may occur with  improper specimen collection/handling, submission of specimen other than nasopharyngeal swab, presence of viral mutation(s) within the areas targeted by this assay, and inadequate number of viral copies(<138 copies/mL). A negative result must be combined with clinical observations, patient history, and epidemiological information. The expected result is Negative.  Fact Sheet for Patients:  EntrepreneurPulse.com.au  Fact Sheet for Healthcare Providers:  IncredibleEmployment.be  This test is no t yet approved or cleared by the [place] FDA and  has been authorized for detection and/or diagnosis of SARS-CoV-2 by FDA under an Emergency Use Authorization (EUA). This EUA will remain  in effect (meaning this test can be used) for the duration of the COVID-19 declaration under Section 564(b)(1) of the Act, 21 U.S.C.section 360bbb-3(b)(1),  unless the authorization is terminated  or revoked sooner.       Influenza A by PCR NEGATIVE NEGATIVE Final   Influenza B by PCR NEGATIVE NEGATIVE Final    Comment: (NOTE) The Xpert Xpress SARS-CoV-2/FLU/RSV plus assay is intended as an aid in the diagnosis of influenza from Nasopharyngeal swab specimens and should not be used as a sole basis for treatment. Nasal washings  and aspirates are unacceptable for Xpert Xpress SARS-CoV-2/FLU/RSV testing.  Fact Sheet for Patients: EntrepreneurPulse.com.au  Fact Sheet for Healthcare Providers: IncredibleEmployment.be  This test is not yet approved or cleared by the [place] FDA and has been authorized for detection and/or diagnosis of SARS-CoV-2 by FDA under an Emergency Use Authorization (EUA). This EUA will remain in effect (meaning this test can be used) for the duration of the COVID-19 declaration under Section 564(b)(1) of the Act, 21 U.S.C. section 360bbb-3(b)(1), unless the authorization is terminated or revoked.  Performed at [facility], [place] [address], [place], [place] [address]     Renal Function: Recent Labs    11/13/20 1951  CREATININE 12.59*   CrCl cannot be calculated (Unknown ideal weight.).  Radiologic Imaging: CT ABDOMEN PELVIS WO CONTRAST  Result Date: 11/13/2020 CLINICAL DATA:  Diarrhea swelling over proximal right clavicle EXAM: CT CHEST, ABDOMEN AND PELVIS WITHOUT CONTRAST TECHNIQUE: Multidetector CT imaging of the chest, abdomen and pelvis was performed following the standard protocol without IV contrast. COMPARISON:  CT 06/25/2012 FINDINGS: CT CHEST FINDINGS Cardiovascular: Limited evaluation without intravenous contrast. Aorta is nonaneurysmal. Normal cardiac size. No pericardial effusion. Mediastinum/Nodes: Midline trachea. No thyroid mass. No suspicious nodes. Esophagus normal Lungs/Pleura: Lungs are clear. No pleural effusion or pneumothorax. Musculoskeletal:  Mild edema and soft tissue swelling/thickening centered at the right sternoclavicular joint. Irregular lucency within the head of the right clavicle. Similar but less extensive changes involving head of left clavicle. Focal heterogeneous lucency within the right first rib, series 4, image 34. Bones appear diffusely dense CT ABDOMEN PELVIS FINDINGS Hepatobiliary: No focal liver abnormality is seen. No gallstones, gallbladder wall thickening, or biliary dilatation. Pancreas: Unremarkable. No pancreatic ductal dilatation or surrounding inflammatory changes. Spleen: Normal in size without focal abnormality. Adrenals/Urinary Tract: Adrenal glands are normal. Status post right nephrectomy. Severe left-sided hydronephrosis and hydroureter with tortuous left ureter dilated down to the level of the bladder. Dilated right ureteral remnant with cystic enlargement of the distal right ureter. Bladder is unremarkable. Stomach/Bowel: Stomach nonenlarged. No dilated small bowel. Negative appendix. No acute bowel wall thickening Vascular/Lymphatic: Nonaneurysmal aorta.  No suspicious nodes Reproductive: Prostate is unremarkable. Other: Negative for pelvic effusion or free air Musculoskeletal: Bones appear diffusely increased in density suggesting metabolic bone disease, possibly due to chronic kidney disease. 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 Interval resorptive changes at bilateral SI joints with some sclerosis. No inflammatory changes by CT. IMPRESSION: 1. Edema and soft tissue thickening centered the right sternoclavicular joint with heterogeneous lucency involving the head of the right clavicle. Similar changes involving head of left clavicle though less extensive. Findings could be secondary to osteomyelitis and infection with bone tumor an additional consideration. Further evaluation with MRI could be considered. Additional heterogeneous focal indeterminate lucent lesion in the right first rib posteriorly. 2.  Status post right nephrectomy. Severe left-sided hydronephrosis and hydroureter down to the level of the bladder. Interval cortical atrophy of left kidney. There is right ureteral remnant which is also dilated with cystic change distally. Electronically Signed   By: Donavan Foil M.D.   On: 11/13/2020 23:11   CT Chest Wo Contrast  Result Date: 11/13/2020 CLINICAL DATA:  Diarrhea swelling over proximal right clavicle EXAM: CT CHEST, ABDOMEN AND PELVIS WITHOUT CONTRAST TECHNIQUE: Multidetector CT imaging of the chest, abdomen and pelvis was performed following the standard protocol without IV contrast. COMPARISON:  CT 06/25/2012 FINDINGS: CT CHEST FINDINGS Cardiovascular: Limited evaluation without intravenous contrast. Aorta is nonaneurysmal. Normal cardiac  size. No pericardial effusion. Mediastinum/Nodes: Midline trachea. No thyroid mass. No suspicious nodes. Esophagus normal Lungs/Pleura: Lungs are clear. No pleural effusion or pneumothorax. Musculoskeletal: Mild edema and soft tissue swelling/thickening centered at the right sternoclavicular joint. Irregular lucency within the head of the right clavicle. Similar but less extensive changes involving head of left clavicle. Focal heterogeneous lucency within the right first rib, series 4, image 34. Bones appear diffusely dense CT ABDOMEN PELVIS FINDINGS Hepatobiliary: No focal liver abnormality is seen. No gallstones, gallbladder wall thickening, or biliary dilatation. Pancreas: Unremarkable. No pancreatic ductal dilatation or surrounding inflammatory changes. Spleen: Normal in size without focal abnormality. Adrenals/Urinary Tract: Adrenal glands are normal. Status post right nephrectomy. Severe left-sided hydronephrosis and hydroureter with tortuous left ureter dilated down to the level of the bladder. Dilated right ureteral remnant with cystic enlargement of the distal right ureter. Bladder is unremarkable. Stomach/Bowel: Stomach nonenlarged. No dilated small  bowel. Negative appendix. No acute bowel wall thickening Vascular/Lymphatic: Nonaneurysmal aorta.  No suspicious nodes Reproductive: Prostate is unremarkable. Other: Negative for pelvic effusion or free air Musculoskeletal: Bones appear diffusely increased in density suggesting metabolic bone disease, possibly due to chronic kidney disease. 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 Interval resorptive changes at bilateral SI joints with some sclerosis. No inflammatory changes by CT. IMPRESSION: 1. Edema and soft tissue thickening centered the right sternoclavicular joint with heterogeneous lucency involving the head of the right clavicle. Similar changes involving head of left clavicle though less extensive. Findings could be secondary to osteomyelitis and infection with bone tumor an additional consideration. Further evaluation with MRI could be considered. Additional heterogeneous focal indeterminate lucent lesion in the right first rib posteriorly. 2. Status post right nephrectomy. Severe left-sided hydronephrosis and hydroureter down to the level of the bladder. Interval cortical atrophy of left kidney. There is right ureteral remnant which is also dilated with cystic change distally. Electronically Signed   By: Donavan Foil M.D.   On: 11/13/2020 23:11   DG Chest Portable 1 View  Result Date: 11/13/2020 CLINICAL DATA:  Evaluate proximal right clavicular head. EXAM: PORTABLE CHEST 1 VIEW COMPARISON:  None. FINDINGS: No gross abnormality of the proximal right clavicular head is evident within the limitations of a single portable frontal radiograph of the chest. Low lung volumes with streaky opacities favoring atelectatic change. No consolidative process. No pneumothorax or layering effusion. The cardiomediastinal contours are unremarkable. No acute osseous or soft tissue abnormality. Telemetry leads overlie the chest. IMPRESSION: No gross abnormality of the proximal right clavicular head though  evaluation is limited on this single view portable chest radiograph. Could consider dedicated clavicular radiographs or cross-sectional imaging if there is persisting clinical concern. Electronically Signed   By: Lovena Le M.D.   On: 11/13/2020 22:36    I independently reviewed the above imaging studies.  Impression/Recommendation Acute kidney injury likely secondary to urinary retention with secondary hydronephrosis.  Successful Foley placement Plan/recommendation: Foley placed to gravity drain will trend renal function and if improving continue with Foley until baseline.  If creatinine does not improve and/or hydronephrosis does not improve with Foley drainage will need cystoscopy and retrograde study to further delineate upper urinary tract.  Remi Haggard 11/14/2020, 4:29 AM       CC:

## 2020-11-14 NOTE — Progress Notes (Addendum)
Pharmacy Antibiotic Note  Micheal Bean is a 35 y.o. male admitted on 11/13/2020 with acute renal failure, leukocytosis, UTI. Also, noted to have bony abnormality of clavicle. > T scan showed changes consistent with possible osteomyelitis versus bone tumor. 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  Plan: Daptomycin 8 mg/kg IV q48 hours = 700 mg IV q48 hours.  Monitor clinical status, renal function and culture results daily.  Monitor CK in AM then weekly   Height: '6\' 3"'$  (190.5 cm) Weight: 86.2 kg (190 lb) IBW/kg (Calculated) : 84.5  Temp (24hrs), Avg:98.6 F (37 C), Min:98.4 F (36.9 C), Max:98.8 F (37.1 C)  Recent Labs  Lab 11/13/20 1951 11/14/20 0500 11/14/20 0542  WBC 29.9*  --  29.8*  CREATININE 12.59* 12.46*  --     Estimated Creatinine Clearance: 10.2 mL/min (A) (by C-G formula based on SCr of 12.46 mg/dL (H)).    No Known Allergies  Antimicrobials this admission: Ceftriaxone 8/11 >> Daptomycin 8/12>>   Dose adjustments this admission:   Microbiology results: 8/12 BCx:  ngttd <12 hrs 8/12 UCx:  sent Sputum:  MRSA PCR:  8/11 Covid: neg: flu: neg  Thank you for allowing pharmacy to be a part of this patient's care. Nicole Cella, RPh Clinical Pharmacist  Please check AMION for all [place] phone numbers After 10:00 PM, call [place] [phone]   11/14/2020 8:07 PM

## 2020-11-14 NOTE — Consult Note (Signed)
[facility] Renal Consultation Note  Requesting MD: Fayrene Helper, MD Indication for Consultation:  AKI  Chief complaint: lightheadness, chest tightness  HPI:  Micheal Bean is a 35 y.o. male with a history of solitary kidney who presented to the hospital a few weeks of lightheadedness/dizziness and also chest tightness and sweating which had begun on the day of presentation.  Per charting he took a dose of amlodipine to see if it would help.  He also reported recent weight loss of 20 lbs per charting.  Recent diarrhea and has had nausea with decreased appetite.  He has been urinating at home but stream recently not as strong.  Urgent care directed him to the ER.  In the ER he was found to have a Cr of 12.59, BUN 111, and low serum bicarbonate at 10.  K ok at 4.1.  Imaging demonstrated s/p right nephrectomy and severe left sided hydronephrosis and hydroureter to the level of the bladder.  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  RN's were unable to place a foley and urology has been consulted.  Once urology placed foley note that over 1000 cc of clear urine was obtained per charting.  Nephrology consulted for assistance with AKI.  He was on a bicarb gtt at 50 ml/hr - increased the dose to 100 ml/hr this am.  He was given PRBC's transfusion in the ER.  He denies any current chest tightness.  Note he has a history of nephrectomy as a child.  Note home meds include naproxen - normally uses a couple of times a week for pain.  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  Covid negative.  His fiance and his sister are at bedside.  His mother would help with medical decisions if needed, as well.  With regard to the labs below he wasn't aware of any renal dysfunction before and states that he does not have a PCP.   He would want dialysis if needed - this has come as a shock as he didn't know he had trouble with his  kidneys.   Creatinine, Ser  Date/Time Value Ref Range Status  11/14/2020 05:00 AM 12.46 (H) 0.61 - 1.24 mg/dL Final    Comment:    DELTA CHECK NOTED  11/13/2020 07:51 PM 12.59 (H) 0.61 - 1.24 mg/dL Final    Comment:    DELTA CHECK NOTED  06/25/2012 06:44 PM 2.00 (H) 0.50 - 1.35 mg/dL Final  07/07/2009 09:55 AM 1.86 (H) 0.4 - 1.5 mg/dL Final     PMHx:   Past Medical History:  Diagnosis Date   Renal disorder    only has right kidney    Past Surgical History:  Procedure Laterality Date   NEPHRECTOMY     childhhod, RT side    Family Hx:  Family History  Problem Relation Age of Onset   Hypertension Mother   No family hx of HTN  Social History:  reports that he has never smoked. He has never used smokeless tobacco. He reports current alcohol use. He reports current drug use. Drug: Marijuana.  Allergies: No Known Allergies  Medications: Prior to Admission medications   Medication Sig Start Date End Date Taking? Authorizing Provider  naproxen sodium (ALEVE) 220 MG tablet Take 220 mg by mouth daily as needed (pain).   Yes [provider]  oxyCODONE-acetaminophen (PERCOCET/ROXICET) 5-325 MG per tablet Take 2 tablets by mouth every 4 (four) hours as needed for  pain. Patient not taking: Reported on 11/13/2020 06/25/12   Piepenbrink, Anderson Malta, PA-C  oxyCODONE-acetaminophen (PERCOCET/ROXICET) 5-325 MG tablet Take 1 tablet by mouth every 4 (four) hours as needed for severe pain. Patient not taking: No sig reported 02/19/18   Nat Christen, MD    I have reviewed the patient's current and reported prior to admission medications.  Labs:  BMP Latest Ref Rng & Units 11/14/2020 11/14/2020 11/13/2020  Glucose 70 - 99 mg/dL 101(H) - 118(H)  BUN 6 - 20 mg/dL 117(H) - 111(H)  Creatinine 0.61 - 1.24 mg/dL 12.46(H) - 12.59(H)  Sodium 135 - 145 mmol/L 134(L) 134(L) 131(L)  Potassium 3.5 - 5.1 mmol/L 4.0 4.5 4.1  Chloride 98 - 111 mmol/L 103 - 106  CO2 22 - 32 mmol/L 11(L) - 10(L)   Calcium 8.9 - 10.3 mg/dL 7.8(L) - 8.0(L)    Urinalysis    Component Value Date/Time   COLORURINE YELLOW 11/13/2020 1931   APPEARANCEUR CLOUDY (A) 11/13/2020 1931   LABSPEC 1.009 11/13/2020 1931   PHURINE 5.0 11/13/2020 1931   GLUCOSEU NEGATIVE 11/13/2020 1931   HGBUR SMALL (A) 11/13/2020 1931   BILIRUBINUR NEGATIVE 11/13/2020 1931   KETONESUR NEGATIVE 11/13/2020 1931   PROTEINUR 100 (A) 11/13/2020 1931   UROBILINOGEN 0.2 06/25/2012 1826   NITRITE POSITIVE (A) 11/13/2020 1931   LEUKOCYTESUR LARGE (A) 11/13/2020 1931     ROS:  Pertinent items noted in HPI and remainder of comprehensive ROS otherwise negative.  Physical Exam: Vitals:   11/14/20 0845 11/14/20 0904  BP: 121/83 117/65  Pulse: 100 99  Resp:  16  Temp:  98.6 F (37 C)  SpO2: 100% 100%     General: adult male in bed in NAD HEENT: NCAT  Eyes: EOMI sclera anicteric Neck: supple trachea midline Heart: S1S2 no rub Lungs: clear to auscultation bilaterally normal work of breathing on room air  Abdomen: soft/nt/nd Extremities: no edema appreciated Skin: no rash on extremities exposed Neuro: alert and oriented x 3 provides hx and follows commands Psych normal mood and affect  GU foley in place with 1250 ml uop  Assessment/Plan:  # Acute renal failure  - Secondary to obstruction in the setting of solitary kidney.  With cortical atrophy unsure of how much improvement is possible and thus this may also represent CKD progression.  He would want dialysis if needed.  I am most concerned about CKD progression given the anemia - continue foley catheter - appreciate urology - Update BMP this afternoon  - change to renal diet  - continue bicarb gtt  - If no improvement by Monday (8/15) then would plan on initiating dialysis - spoke with patient, team and his family about this recommendation  # Anemia iron deficiency and CKD - iron deficiency noted.  Platelets normal and elevated WBC.  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

## 2020-11-14 NOTE — ED Notes (Signed)
Nurse report given to Florentina Jenny, [place]

## 2020-11-14 NOTE — Progress Notes (Signed)
Update  D-dimer returned elevated at >10. Unclear utility in acute renal failure and no respiratory symptoms so low suspicion for PE. Unable to take contrast for CGT PE study given renal failure. Possible of DVT exist given ongoing work up with mass/malignancy on the differential. Will check lower extremity DVT study. Could consider V/Q scan if respiratory symptoms develop. Awaiting stool sample heme occult to rule of and component of GI bleeding in anemia so not currently on anticoagulation. - DVT study - Monitory respiratory status

## 2020-11-14 NOTE — ED Notes (Signed)
Patient is sleeping noted chest rise and fall with inspiration and expiration.

## 2020-11-14 NOTE — ED Notes (Signed)
Family updated as to patient's status.

## 2020-11-14 NOTE — Consult Note (Addendum)
[address] [place] [facility]       [place], [address]             [phone]        Ivan L Scheel [place] Medical Record [id number] Date of Birth: [birth year]  Referring: Marcelyn Bruins MD Primary Care: Patient, No Pcp Per (Inactive) Primary Cardiologist:None  Chief Complaint:    Chief Complaint  Patient presents with   Dizziness    History of Present Illness:     Mr. Mais is a 35 year old male with a past history of right nephrectomy and known past history of renal insufficiency (creatinine 2.0 in 2014) who was sent to the [facility] ED by an urgent care center yesterday with primary complaints of headache, lightheadedness, and chest tightness of 3 to 4 days duration.  He also reports unintentional weight loss of about 20 pounds over the past 2 months.  Work-up included significant lab abnormalities including creatinine of 12.6, hemoglobin of 6, white blood count 29,900, and proteinuria.  EKG showed sinus tachycardia mildly prolonged QT interval but no evidence of ischemia.  Chest x-ray was unremarkable.  However, CT scan of the chest, abdomen, and pelvis performed notable findings.  There was some irregular lucency within the head of the right clavicle with adjacent edema and soft tissue swelling.  There were similar but less pronounced changes in the head of the left clavicle.  In addition, there was some lucency and first right rib posteriorly.  There was severe left-sided hydronephrosis with hydroureter. A Foley catheter could not be placed at the bedside by the ED nursing staff but this was later accomplished by the urology service. 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  Mr. Baumgardner denies any IV drug use.  He does not use tobacco products but does smoke marijuana.  He does not know of any injury to his chest or clavicles.   Current Activity/ Functional Status: Patient is independent with  mobility/ambulation, transfers, ADL's, IADL's.   Zubrod Score: At the time of surgery this patient's most appropriate activity status/level should be described as: '[]'$     0    Normal activity, no symptoms '[x]'$     1    Restricted in physical strenuous activity but ambulatory, able to do out light work '[]'$     2    Ambulatory and capable of self care, unable to do work activities, up and about                 more than 50%  Of the time                            '[]'$     3    Only limited self care, in bed greater than 50% of waking hours '[]'$     4    Completely disabled, no self care, confined to bed or chair '[]'$     5    Moribund  Past Medical History:  Diagnosis Date   Renal disorder    only has right kidney    Past Surgical History:  Procedure Laterality Date   NEPHRECTOMY     childhhod, RT side    Social History   Tobacco Use  Smoking Status Never  Smokeless Tobacco Never    Social History   Substance and Sexual Activity  Alcohol Use Yes   Comment: occassional  No Known Allergies  Current Facility-Administered Medications  Medication Dose Route Frequency Provider Last Rate Last Admin   0.9 %  sodium chloride infusion  10 mL/hr Intravenous Once Marcelyn Bruins, MD       acetaminophen (TYLENOL) tablet 650 mg  650 mg Oral Q6H PRN Marcelyn Bruins, MD       Or   acetaminophen (TYLENOL) suppository 650 mg  650 mg Rectal Q6H PRN Marcelyn Bruins, MD       cefTRIAXone (ROCEPHIN) 1 g in sodium chloride 0.9 % 100 mL IVPB  1 g Intravenous Q24H Marcelyn Bruins, MD   Stopped at 11/13/20 2323   polyethylene glycol (MIRALAX / GLYCOLAX) packet 17 g  17 g Oral Daily PRN Marcelyn Bruins, MD       sodium bicarbonate 150 mEq in sterile water 1,150 mL infusion   Intravenous Continuous Claudia Desanctis, MD 125 mL/hr at 11/14/20 1153 Rate Change at 11/14/20 1153   sodium chloride flush (NS) 0.9 % injection 3 mL  3 mL Intravenous Q12H Marcelyn Bruins, MD   3 mL at 11/13/20  2358   vitamin B-12 (CYANOCOBALAMIN) tablet 1,000 mcg  1,000 mcg Oral Daily Elodia Florence., MD   1,000 mcg at 11/14/20 1201   Current Outpatient Medications  Medication Sig Dispense Refill   naproxen sodium (ALEVE) 220 MG tablet Take 220 mg by mouth daily as needed (pain).     oxyCODONE-acetaminophen (PERCOCET/ROXICET) 5-325 MG per tablet Take 2 tablets by mouth every 4 (four) hours as needed for pain. (Patient not taking: Reported on 11/13/2020) 15 tablet 0   oxyCODONE-acetaminophen (PERCOCET/ROXICET) 5-325 MG tablet Take 1 tablet by mouth every 4 (four) hours as needed for severe pain. (Patient not taking: No sig reported) 12 tablet 0    (Not in a hospital admission)   Family History  Problem Relation Age of Onset   Hypertension Mother      Review of Systems:   ROS     Cardiac Review of Systems: Y or  [    ]= no  Chest Pain [  x  ]  Resting SOB [   ] Exertional SOB  [ x ]  Orthopnea [  ]   Pedal Edema [   ]    Palpitations [  ] Syncope  [  ]   Presyncope [   ]  General Review of Systems: [Y] = yes [  ]=no Constitional: recent weight change [ x ]; anorexia [ x ]; fatigue [  ]; nausea [  ]; night sweats [  ]; fever [  ]; or chills [  ]                                                                Eye : blurred vision [  ]; diplopia [   ]; vision changes [  ];  Amaurosis fugax[  ]; Resp: cough [  ];  wheezing[  ];  hemoptysis[  ]; shortness of breath[  ]; paroxysmal nocturnal dyspnea[  ]; dyspnea on exertion[  ]; or orthopnea[  ];  GI:  gallstones[  ], vomiting[  ];  dysphagia[  ]; melena[  ];  hematochezia [  ]; heartburn[  ];   Hx of  Colonoscopy[  ]; GU: kidney stones [  ]; hematuria[  ];   dysuria [  ];  nocturia[  ];  history of     obstruction [  ]; urinary frequency [  ]             Skin: rash, swelling[  ];, hair loss[  ];  peripheral edema[  ];  or itching[  ]; Musculosketetal: myalgias[  ];  joint swelling[  ];  joint erythema[  ];  joint pain[  ];  back pain[   ];  Heme/Lymph: bruising[  ];  bleeding[  ];  anemia[  ];  Neuro: TIA[  ];  headaches[ x ];  stroke[  ];  vertigo[  ];  seizures[  ];   paresthesias[  ];  difficulty walking[  ];  Psych:depression[  ]; anxiety[  ];  Endocrine: diabetes[  ];  thyroid dysfunction[  ];                Physical Exam: BP 115/77   Pulse 93   Temp 98.6 F (37 C) (Oral)   Resp 16   Ht '6\' 3"'$  (1.905 m)   Wt 86.2 kg   SpO2 100%   BMI 23.75 kg/m    General appearance: alert, cooperative, and no distress Head: Normocephalic, without obvious abnormality, atraumatic Neck: no adenopathy, no carotid bruit, no JVD, and supple, symmetrical, trachea midline Lymph nodes: No cervical or clavicular adenopathy Resp: Breath sounds are clear to auscultation bilaterally. Cardio: Regular rate and rhythm, no murmur GI: Soft, nontender, active bowel sounds. Extremities: No deformities.  All extremities are well perfused.  There is mild swelling without erythema or induration overlying the head of the right clavicle and to a lesser degree over the left clavicle.  The right sternoclavicular joint is mildly tender.  There is no fluctuance. Neurologic: Grossly normal  Diagnostic Studies & Laboratory data:     Recent Radiology Findings:   CT ABDOMEN PELVIS WO CONTRAST  Result Date: 11/13/2020 CLINICAL DATA:  Diarrhea swelling over proximal right clavicle EXAM: CT CHEST, ABDOMEN AND PELVIS WITHOUT CONTRAST TECHNIQUE: Multidetector CT imaging of the chest, abdomen and pelvis was performed following the standard protocol without IV contrast. COMPARISON:  CT 06/25/2012 FINDINGS: CT CHEST FINDINGS Cardiovascular: Limited evaluation without intravenous contrast. Aorta is nonaneurysmal. Normal cardiac size. No pericardial effusion. Mediastinum/Nodes: Midline trachea. No thyroid mass. No suspicious nodes. Esophagus normal Lungs/Pleura: Lungs are clear. No pleural effusion or pneumothorax. Musculoskeletal: Mild edema and soft tissue  swelling/thickening centered at the right sternoclavicular joint. Irregular lucency within the head of the right clavicle. Similar but less extensive changes involving head of left clavicle. Focal heterogeneous lucency within the right first rib, series 4, image 34. Bones appear diffusely dense CT ABDOMEN PELVIS FINDINGS Hepatobiliary: No focal liver abnormality is seen. No gallstones, gallbladder wall thickening, or biliary dilatation. Pancreas: Unremarkable. No pancreatic ductal dilatation or surrounding inflammatory changes. Spleen: Normal in size without focal abnormality. Adrenals/Urinary Tract: Adrenal glands are normal. Status post right nephrectomy. Severe left-sided hydronephrosis and hydroureter with tortuous left ureter dilated down to the level of the bladder. Dilated right ureteral remnant with cystic enlargement of the distal right ureter. Bladder is unremarkable. Stomach/Bowel: Stomach nonenlarged. No dilated small bowel. Negative appendix. No acute bowel wall thickening Vascular/Lymphatic: Nonaneurysmal aorta.  No suspicious nodes Reproductive: Prostate is unremarkable. Other: Negative for pelvic effusion or free air Musculoskeletal: Bones appear diffusely increased in density suggesting metabolic bone disease, possibly due to chronic kidney disease. 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 Interval resorptive changes at bilateral SI joints with some sclerosis. No inflammatory changes by CT. IMPRESSION: 1. Edema and soft tissue thickening centered the right sternoclavicular joint with heterogeneous lucency involving the head of the right clavicle. Similar changes involving head of left clavicle though less extensive. Findings could be secondary to osteomyelitis and infection with bone tumor an additional consideration. Further evaluation with MRI could be considered. Additional heterogeneous focal indeterminate lucent lesion in the right first rib posteriorly. 2. Status post right nephrectomy.  Severe left-sided hydronephrosis and hydroureter down to the level of the bladder. Interval cortical atrophy of left kidney. There is right ureteral remnant which is also dilated with cystic change distally. Electronically Signed   By: Donavan Foil M.D.   On: 11/13/2020 23:11   CT Chest Wo Contrast  Result Date: 11/13/2020 CLINICAL DATA:  Diarrhea swelling over proximal right clavicle EXAM: CT CHEST, ABDOMEN AND PELVIS WITHOUT CONTRAST TECHNIQUE: Multidetector CT imaging of the chest, abdomen and pelvis was performed following the standard protocol without IV contrast. COMPARISON:  CT 06/25/2012 FINDINGS: CT CHEST FINDINGS Cardiovascular: Limited evaluation without intravenous contrast. Aorta is nonaneurysmal. Normal cardiac size. No pericardial effusion. Mediastinum/Nodes: Midline trachea. No thyroid mass. No suspicious nodes. Esophagus normal Lungs/Pleura: Lungs are clear. No pleural effusion or pneumothorax. Musculoskeletal: Mild edema and soft tissue swelling/thickening centered at the right sternoclavicular joint. Irregular lucency within the head of the right clavicle. Similar but less extensive changes involving head of left clavicle. Focal heterogeneous lucency within the right first rib, series 4, image 34. Bones appear diffusely dense CT ABDOMEN PELVIS FINDINGS Hepatobiliary: No focal liver abnormality is seen. No gallstones, gallbladder wall thickening, or biliary dilatation. Pancreas: Unremarkable. No pancreatic ductal dilatation or surrounding inflammatory changes. Spleen: Normal in size without focal abnormality. Adrenals/Urinary Tract: Adrenal glands are normal. Status post right nephrectomy. Severe left-sided hydronephrosis and hydroureter with tortuous left ureter dilated down to the level of the bladder. Dilated right ureteral remnant with cystic enlargement of the distal right ureter. Bladder is unremarkable. Stomach/Bowel: Stomach nonenlarged. No dilated small bowel. Negative appendix. No  acute bowel wall thickening Vascular/Lymphatic: Nonaneurysmal aorta.  No suspicious nodes Reproductive: Prostate is unremarkable. Other: Negative for pelvic effusion or free air Musculoskeletal: Bones appear diffusely increased in density suggesting metabolic bone disease, possibly due to chronic kidney disease. 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 Interval resorptive changes at bilateral SI joints with some sclerosis. No inflammatory changes by CT. IMPRESSION: 1. Edema and soft tissue thickening centered the right sternoclavicular joint with heterogeneous lucency involving the head of the right clavicle. Similar changes involving head of left clavicle though less extensive. Findings could be secondary to osteomyelitis and infection with bone tumor an additional consideration. Further evaluation with MRI could be considered. Additional heterogeneous focal indeterminate lucent lesion in the right first rib posteriorly. 2. Status post right nephrectomy. Severe left-sided hydronephrosis and hydroureter down to the level of the bladder. Interval cortical atrophy of left kidney. There is right ureteral remnant which is also dilated with cystic change distally. Electronically Signed   By: Donavan Foil M.D.   On: 11/13/2020 23:11   DG Chest Portable 1 View  Result Date: 11/13/2020 CLINICAL DATA:  Evaluate proximal right clavicular head. EXAM: PORTABLE CHEST 1 VIEW COMPARISON:  None. FINDINGS: No gross abnormality of the proximal right clavicular head is evident within the limitations of a single portable frontal radiograph of the chest. Low lung volumes with streaky opacities favoring atelectatic change. No  consolidative process. No pneumothorax or layering effusion. The cardiomediastinal contours are unremarkable. No acute osseous or soft tissue abnormality. Telemetry leads overlie the chest. IMPRESSION: No gross abnormality of the proximal right clavicular head though evaluation is limited on this single  view portable chest radiograph. Could consider dedicated clavicular radiographs or cross-sectional imaging if there is persisting clinical concern. Electronically Signed   By: Lovena Le M.D.   On: 11/13/2020 22:36   VAS [place] LOWER EXTREMITY VENOUS (DVT)  Result Date: 11/14/2020  Lower Venous DVT Study Patient Name:  MICHELANGELO KLAUSER  Date of Exam:   11/14/2020 Medical Rec #: [id number]      Accession #:    [id number] Date of Birth: [birth year]      Patient Gender: M Patient Age:   3 years Exam Location:  [facility] Procedure:      VAS [place] LOWER EXTREMITY VENOUS (DVT) Referring Phys: Sheppard Coil MELVIN --------------------------------------------------------------------------------  Indications: Elevated D-dimer.  Comparison Study: No previous exams Performing Technologist: Darlin Coco  Examination Guidelines: A complete evaluation includes B-mode imaging, spectral Doppler, color Doppler, and power Doppler as needed of all accessible portions of each vessel. Bilateral testing is considered an integral part of a complete examination. Limited examinations for reoccurring indications may be performed as noted. The reflux portion of the exam is performed with the patient in reverse Trendelenburg.  +---------+---------------+---------+-----------+----------+--------------+ RIGHT    CompressibilityPhasicitySpontaneityPropertiesThrombus Aging +---------+---------------+---------+-----------+----------+--------------+ CFV      Full           Yes      Yes                                 +---------+---------------+---------+-----------+----------+--------------+ SFJ      Full                                                        +---------+---------------+---------+-----------+----------+--------------+ FV Prox  Full                                                        +---------+---------------+---------+-----------+----------+--------------+ FV Mid   Full                                                         +---------+---------------+---------+-----------+----------+--------------+ FV DistalFull                                                        +---------+---------------+---------+-----------+----------+--------------+ PFV      Full                                                        +---------+---------------+---------+-----------+----------+--------------+  POP      Full           Yes      Yes                                 +---------+---------------+---------+-----------+----------+--------------+ PTV      Full                                                        +---------+---------------+---------+-----------+----------+--------------+ PERO     Full                                                        +---------+---------------+---------+-----------+----------+--------------+ Gastroc  Full                                                        +---------+---------------+---------+-----------+----------+--------------+   +---------+---------------+---------+-----------+----------+--------------+ LEFT     CompressibilityPhasicitySpontaneityPropertiesThrombus Aging +---------+---------------+---------+-----------+----------+--------------+ CFV      Full           Yes      Yes                                 +---------+---------------+---------+-----------+----------+--------------+ SFJ      Full                                                        +---------+---------------+---------+-----------+----------+--------------+ FV Prox  Full                                                        +---------+---------------+---------+-----------+----------+--------------+ FV Mid   Full                                                        +---------+---------------+---------+-----------+----------+--------------+ FV DistalFull                                                         +---------+---------------+---------+-----------+----------+--------------+ PFV      Full                                                        +---------+---------------+---------+-----------+----------+--------------+  POP      Full           Yes      Yes                                 +---------+---------------+---------+-----------+----------+--------------+ PTV      Full                                                        +---------+---------------+---------+-----------+----------+--------------+ PERO     Full                                                        +---------+---------------+---------+-----------+----------+--------------+ Gastroc  Full                                                        +---------+---------------+---------+-----------+----------+--------------+     Summary: BILATERAL: - No evidence of deep vein thrombosis seen in the lower extremities, bilaterally. - No evidence of superficial venous thrombosis in the lower extremities, bilaterally. -No evidence of popliteal cyst, bilaterally.   *See table(s) above for measurements and observations.    Preliminary      I have independently reviewed the above radiologic studies and discussed with the patient   Recent Lab Findings: Lab Results  Component Value Date   WBC 29.8 (H) 11/14/2020   HGB 6.6 (LL) 11/14/2020   HCT 20.9 (L) 11/14/2020   PLT 276 11/14/2020   GLUCOSE 101 (H) 11/14/2020   ALT 9 11/13/2020   AST 13 (L) 11/13/2020   NA 134 (L) 11/14/2020   K 4.0 11/14/2020   CL 103 11/14/2020   CREATININE 12.46 (H) 11/14/2020   BUN 117 (H) 11/14/2020   CO2 11 (L) 11/14/2020   INR 1.5 (H) 11/13/2020      Assessment / Plan:    -Pleasant 35 year old male presenting to the emergency room with Symptoms of headache, lightheadedness, chest tightness, over 3 to 4 days duration and unintentional weight loss of about 20 pounds over 2 months.  He was noted on initial exam to have some  swelling over the right sternoclavicular joint.  Chest x-ray showed no significant abnormality.  CT scan of the chest abdomen and pelvis, however, demonstrates irregular lucency in the head of the right clavicle along with surrounding tissue swelling.  There is no evidence of abscess collection.  Additional significant findings on work-up have included creatinine 12.5, hemoglobin 6.6.  WBC 29.9.  Blood cultures have been drawn and are pending.  He is currently being transfused with packed red cells.  Consultants include nephrology, urology (for placement of Foley catheter), and infectious disease.  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  CT surgery has been asked to evaluate for consideration of surgical intervention for the [facility] joint osteomyelitis.  There is no evidence of a drainable fluid collection and minimal tenderness at the  site.  I think medical management with antibiotics as you are doing is most appropriate at this stage.  As work-up moves forward, if biopsy or debridement of these lesions is deemed necessary, we can certainly assist with that.  Case discussed with Dr. Kipp Brood.  Further recommendations will follow.   I  spent 20 minutes counseling the patient face to face.   Antony Odea, PA-C  11/14/2020 1:53 PM  Agree with above. 35 year old male with soft tissue swelling involving the right clavicle.  No obvious abscess.  Would recommend continuing antibiotic therapy.  We will treat this as osteomyelitis with a full course of antibiotics.  I would recommend rescanning if symptoms worsen or completion of antibiotic therapy to assess for fluid collection.  If there is no fluid collection that he will not require any debridement.

## 2020-11-15 LAB — BPAM RBC
Blood Product Expiration Date: 202208312359
Blood Product Expiration Date: 202209012359
Blood Product Expiration Date: 202209012359
ISSUE DATE / TIME: 202208112348
ISSUE DATE / TIME: 202208121112
ISSUE DATE / TIME: 202208121739
Unit Type and Rh: 7300
Unit Type and Rh: 7300
Unit Type and Rh: 7300

## 2020-11-15 LAB — BASIC METABOLIC PANEL
Anion gap: 20 — ABNORMAL HIGH (ref 5–15)
Anion gap: 19 — ABNORMAL HIGH (ref 5–15)
BUN: 119 mg/dL — ABNORMAL HIGH (ref 6–20)
BUN: 119 mg/dL — ABNORMAL HIGH (ref 6–20)
CO2: 15 mmol/L — ABNORMAL LOW (ref 22–32)
CO2: 17 mmol/L — ABNORMAL LOW (ref 22–32)
Calcium: 7.7 mg/dL — ABNORMAL LOW (ref 8.9–10.3)
Calcium: 8.1 mg/dL — ABNORMAL LOW (ref 8.9–10.3)
Chloride: 99 mmol/L (ref 98–111)
Chloride: 99 mmol/L (ref 98–111)
Creatinine, Ser: 11.88 mg/dL — ABNORMAL HIGH (ref 0.61–1.24)
Creatinine, Ser: 11.37 mg/dL — ABNORMAL HIGH (ref 0.61–1.24)
GFR, Estimated: 5 mL/min — ABNORMAL LOW (ref >60)
GFR, Estimated: 6 mL/min — ABNORMAL LOW (ref >60)
Glucose, Bld: 96 mg/dL (ref 70–99)
Glucose, Bld: 88 mg/dL (ref 70–99)
Potassium: 2.9 mmol/L — ABNORMAL LOW (ref 3.5–5.1)
Potassium: 3.4 mmol/L — ABNORMAL LOW (ref 3.5–5.1)
Sodium: 134 mmol/L — ABNORMAL LOW (ref 135–145)
Sodium: 135 mmol/L (ref 135–145)

## 2020-11-15 LAB — CBC WITH DIFFERENTIAL/PLATELET
Abs Immature Granulocytes: 0.32 10*3/uL — ABNORMAL HIGH (ref 0.00–0.07)
Basophils Absolute: 0.1 10*3/uL (ref 0.0–0.1)
Basophils Relative: 0 %
Eosinophils Absolute: 0.1 10*3/uL (ref 0.0–0.5)
Eosinophils Relative: 1 %
HCT: 25.1 % — ABNORMAL LOW (ref 39.0–52.0)
Hemoglobin: 8.6 g/dL — ABNORMAL LOW (ref 13.0–17.0)
Immature Granulocytes: 1 %
Lymphocytes Relative: 5 %
Lymphs Abs: 1.1 10*3/uL (ref 0.7–4.0)
MCH: 29.6 pg (ref 26.0–34.0)
MCHC: 34.3 g/dL (ref 30.0–36.0)
MCV: 86.3 fL (ref 80.0–100.0)
Monocytes Absolute: 1.8 10*3/uL — ABNORMAL HIGH (ref 0.1–1.0)
Monocytes Relative: 8 %
Neutro Abs: 20.3 10*3/uL — ABNORMAL HIGH (ref 1.7–7.7)
Neutrophils Relative %: 85 %
Platelets: 257 10*3/uL (ref 150–400)
RBC: 2.91 MIL/uL — ABNORMAL LOW (ref 4.22–5.81)
RDW: 14.2 % (ref 11.5–15.5)
WBC: 23.7 10*3/uL — ABNORMAL HIGH (ref 4.0–10.5)
nRBC: 0 % (ref 0.0–0.2)

## 2020-11-15 LAB — SEDIMENTATION RATE: Sed Rate: 129 mm/hr — ABNORMAL HIGH (ref 0–16)

## 2020-11-15 LAB — CK: Total CK: 38 U/L — ABNORMAL LOW (ref 49–397)

## 2020-11-15 LAB — COMPREHENSIVE METABOLIC PANEL
ALT: 9 U/L (ref 0–44)
AST: 8 U/L — ABNORMAL LOW (ref 15–41)
Albumin: 2.4 g/dL — ABNORMAL LOW (ref 3.5–5.0)
Alkaline Phosphatase: 100 U/L (ref 38–126)
Anion gap: 19 — ABNORMAL HIGH (ref 5–15)
BUN: 118 mg/dL — ABNORMAL HIGH (ref 6–20)
CO2: 16 mmol/L — ABNORMAL LOW (ref 22–32)
Calcium: 8 mg/dL — ABNORMAL LOW (ref 8.9–10.3)
Chloride: 101 mmol/L (ref 98–111)
Creatinine, Ser: 11.81 mg/dL — ABNORMAL HIGH (ref 0.61–1.24)
GFR, Estimated: 5 mL/min — ABNORMAL LOW (ref >60)
Glucose, Bld: 91 mg/dL (ref 70–99)
Potassium: 2.9 mmol/L — ABNORMAL LOW (ref 3.5–5.1)
Sodium: 136 mmol/L (ref 135–145)
Total Bilirubin: 0.7 mg/dL (ref 0.3–1.2)
Total Protein: 6.5 g/dL (ref 6.5–8.1)

## 2020-11-15 LAB — TYPE AND SCREEN
ABO/RH(D): B POS
Antibody Screen: NEGATIVE
Unit division: 0
Unit division: 0
Unit division: 0

## 2020-11-15 LAB — URINE CULTURE: Culture: NO GROWTH

## 2020-11-15 LAB — PHOSPHORUS: Phosphorus: 11.8 mg/dL — ABNORMAL HIGH (ref 2.5–4.6)

## 2020-11-15 LAB — C-REACTIVE PROTEIN: CRP: 19.7 mg/dL — ABNORMAL HIGH (ref <1.0)

## 2020-11-15 LAB — TECHNOLOGIST SMEAR REVIEW
Plt Morphology: ADEQUATE
WBC MORPHOLOGY: INCREASED

## 2020-11-15 LAB — HAPTOGLOBIN: Haptoglobin: 343 mg/dL — ABNORMAL HIGH (ref 17–317)

## 2020-11-15 LAB — HIV ANTIBODY (ROUTINE TESTING W REFLEX): HIV Screen 4th Generation wRfx: NONREACTIVE

## 2020-11-15 LAB — MAGNESIUM: Magnesium: 1.5 mg/dL — ABNORMAL LOW (ref 1.7–2.4)

## 2020-11-15 MED ORDER — POTASSIUM CHLORIDE CRYS ER 20 MEQ PO TBCR
40.0000 meq | EXTENDED_RELEASE_TABLET | Freq: Once | ORAL | Status: AC
  Administered 2020-11-15: 40 meq via ORAL
  Filled 2020-11-15: qty 2

## 2020-11-15 MED ORDER — MAGNESIUM SULFATE 2 GM/50ML IV SOLN
2.0000 g | Freq: Once | INTRAVENOUS | Status: AC
  Administered 2020-11-15: 2 g via INTRAVENOUS
  Filled 2020-11-15: qty 50

## 2020-11-15 MED ORDER — SODIUM BICARBONATE 650 MG PO TABS
650.0000 mg | ORAL_TABLET | Freq: Three times a day (TID) | ORAL | Status: DC
  Administered 2020-11-15 – 2020-11-19 (×12): 650 mg via ORAL
  Filled 2020-11-15 (×12): qty 1

## 2020-11-15 MED ORDER — POTASSIUM CHLORIDE CRYS ER 20 MEQ PO TBCR
20.0000 meq | EXTENDED_RELEASE_TABLET | Freq: Once | ORAL | Status: AC
  Administered 2020-11-15: 20 meq via ORAL
  Filled 2020-11-15: qty 1

## 2020-11-15 MED ORDER — SEVELAMER CARBONATE 800 MG PO TABS
800.0000 mg | ORAL_TABLET | Freq: Three times a day (TID) | ORAL | Status: DC
  Administered 2020-11-15 – 2020-11-16 (×5): 800 mg via ORAL
  Filled 2020-11-15 (×5): qty 1

## 2020-11-15 NOTE — Progress Notes (Signed)
[facility] Progress Note  Name: Micheal Bean MRN: [id number] DOB: 09-02-1985  Chief Complaint:  Lightheadedness/dizziness  Subjective:  he had 2.7 liters UOP over 8/12 charted.  Per ER rn he had initially declined labs yesterday during day.  He has continued with foley.  Bicarb gtt no longer ordered.  Interval hypokalemia.  Family is at bedside.  We talked about the different types of dialysis today and about longer term goal of transplant.  Does like to swim.  Feels less lightheaded.  Review of systems:  Denies n/v Denies shortness of breath or chest pain  ----------- Background on consult:  Micheal Bean is a 35 y.o. male with a history of solitary kidney who presented to the hospital a few weeks of lightheadedness/dizziness and also chest tightness and sweating which had begun on the day of presentation.  Per charting he took a dose of amlodipine to see if it would help.  He also reported recent weight loss of 20 lbs per charting.  Recent diarrhea and has had nausea with decreased appetite.  He has been urinating at home but stream recently not as strong.  Urgent care directed him to the ER.  In the ER he was found to have a Cr of 12.59, BUN 111, and low serum bicarbonate at 10.  K ok at 4.1.  Imaging demonstrated s/p right nephrectomy and severe left sided hydronephrosis and hydroureter to the level of the bladder.  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  RN's were unable to place a foley and urology has been consulted.  Once urology placed foley note that over 1000 cc of clear urine was obtained per charting.  Nephrology consulted for assistance with AKI.  He was on a bicarb gtt at 50 ml/hr - increased the dose to 100 ml/hr this am.  He was given PRBC's transfusion in the ER.  He denies any current chest tightness.  Note he has a history of nephrectomy as a child.  Note home meds include naproxen - normally uses a couple of times a week for pain.  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  Covid negative.  His fiance and his sister are at bedside.  His mother would help with medical decisions if needed, as well.  With regard to the labs below he wasn't aware of any renal dysfunction before and states that he does not have a PCP.    He would want dialysis if needed - this has come as a shock as he didn't know he had trouble with his kidneys.  Intake/Output Summary (Last 24 hours) at 11/15/2020 1130 Last data filed at 11/15/2020 0500 Gross per 24 hour  Intake 2603.9 ml  Output 2650 ml  Net -46.1 ml    Vitals:  Vitals:   11/14/20 2100 11/14/20 2144 11/14/20 2154 11/15/20 0514  BP: 120/80 125/84  128/78  Pulse: 86 82  77  Resp: '16 17  19  '$ Temp: 98.5 F (36.9 C) 98.8 F (37.1 C)  98.6 F (37 C)  TempSrc: Oral Oral  Oral  SpO2: 100% 100%  100%  Weight:   79.8 kg   Height:         Physical Exam:     General: adult male in bed in NAD HEENT: NCAT  Eyes: EOMI sclera anicteric Neck: supple trachea midline Heart: S1S2 no rub Lungs: clear to auscultation bilaterally normal work of breathing on room air  Abdomen:  soft/nt/nd Extremities: no edema appreciated Neuro: alert and oriented x 3 provides hx and follows commands Psych normal mood and affect  GU foley in place with 1250 ml uop  Medications reviewed   Labs:  BMP Latest Ref Rng & Units 11/15/2020 11/14/2020 11/14/2020  Glucose 70 - 99 mg/dL 91 96 101(H)  BUN 6 - 20 mg/dL 118(H) 119(H) 117(H)  Creatinine 0.61 - 1.24 mg/dL 11.81(H) 11.88(H) 12.46(H)  Sodium 135 - 145 mmol/L 136 134(L) 134(L)  Potassium 3.5 - 5.1 mmol/L 2.9(L) 2.9(L) 4.0  Chloride 98 - 111 mmol/L 101 99 103  CO2 22 - 32 mmol/L 16(L) 15(L) 11(L)  Calcium 8.9 - 10.3 mg/dL 8.0(L) 7.7(L) 7.8(L)     Assessment/Plan:   # Acute renal failure now favoring CKD - Secondary to obstruction and in the setting of solitary kidney.  With cortical atrophy unsure of how much  improvement is possible and thus this may also represent CKD progression.  He would want dialysis if needed.  I am most concerned about CKD progression given the anemia - continue foley catheter - appreciate urology - If no improvement by Monday (8/15) then would plan on initiating dialysis - spoke with patient, team and his family about this recommendation.  I feel that this will be necessary.  - He is thinking about dialysis modality for home    # Anemia iron deficiency and CKD - iron deficiency noted.  Platelets normal and elevated WBC.  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

## 2020-11-15 NOTE — Progress Notes (Signed)
Urology Inpatient Progress Report  Acute renal failure (ARF) (HCC) [N17.9] AKI (acute kidney injury) ([place]) [N17.9] Anemia, unspecified type [D64.9]        Intv/Subj: No acute events overnight. Patient is without complaint. UOP picking up but Cr remains stable but significantly elevated.  Principal Problem:   Acute renal failure (ARF) (HCC) Active Problems:   Acute lower UTI   Leukocytosis   Bone lesion   AKI (acute kidney injury) ([place])   Osteomyelitis (HCC)  Current Facility-Administered Medications  Medication Dose Route Frequency Provider Last Rate Last Admin   0.9 %  sodium chloride infusion  10 mL/hr Intravenous Once Marcelyn Bruins, MD       acetaminophen (TYLENOL) tablet 650 mg  650 mg Oral Q6H PRN Marcelyn Bruins, MD       Or   acetaminophen (TYLENOL) suppository 650 mg  650 mg Rectal Q6H PRN Marcelyn Bruins, MD       cefTRIAXone (ROCEPHIN) 2 g in sodium chloride 0.9 % 100 mL IVPB  2 g Intravenous Q24H Elodia Florence., MD 200 mL/hr at 11/14/20 2117 2 g at 11/14/20 2117   Chlorhexidine Gluconate Cloth 2 % PADS 6 each  6 each Topical Q0600 Elodia Florence., MD       DAPTOmycin (CUBICIN) 700 mg in sodium chloride 0.9 % IVPB  8 mg/kg (Adjusted) Intravenous Q48H Wendee Beavers, RPH 128 mL/hr at 11/15/20 0015 700 mg at 11/15/20 0015   magnesium sulfate IVPB 2 g 50 mL  2 g Intravenous Once Claudia Desanctis, MD       polyethylene glycol (MIRALAX / GLYCOLAX) packet 17 g  17 g Oral Daily PRN Marcelyn Bruins, MD       potassium chloride SA (KLOR-CON) CR tablet 40 mEq  40 mEq Oral Once Claudia Desanctis, MD       sevelamer carbonate (RENVELA) tablet 800 mg  800 mg Oral TID WC Claudia Desanctis, MD       sodium chloride flush (NS) 0.9 % injection 3 mL  3 mL Intravenous Q12H Marcelyn Bruins, MD   3 mL at 11/15/20 [id number]   vitamin B-12 (CYANOCOBALAMIN) tablet 1,000 mcg  1,000 mcg Oral Daily Elodia Florence., MD   1,000 mcg at 11/15/20 [id number]      Objective: Vital: Vitals:   11/14/20 2144 11/14/20 2154 11/15/20 0514 11/15/20 1130  BP: 125/84  128/78 118/87  Pulse: 82  77 78  Resp: '17  19 18  '$ Temp: 98.8 F (37.1 C)  98.6 F (37 C) 98.4 F (36.9 C)  TempSrc: Oral  Oral Oral  SpO2: 100%  100% 100%  Weight:  79.8 kg    Height:       I/Os: I/O last 3 completed shifts: In: 3360.6 [I.V.:1310; Blood:1866; IV Piggyback:184.6] Out: 3550 [Urine:3550]  Physical Exam:  General: Patient is in no apparent distress Lungs: Normal respiratory effort, chest expands symmetrically. GI: The abdomen is soft and nontender without mass. Foley: draining clear yellow urine  Ext: lower extremities symmetric  Lab Results: Recent Labs    11/13/20 1951 11/14/20 0000 11/14/20 0542 11/15/20 0636  WBC 29.9*  --  29.8* 23.7*  HGB 6.0* 6.8* 6.6* 8.6*  HCT 19.9* 20.0* 20.9* 25.1*   Recent Labs    11/14/20 0500 11/14/20 2326 11/15/20 0636  NA 134* 134* 136  K 4.0 2.9* 2.9*  CL 103 99 101  CO2 11* 15* 16*  GLUCOSE 101* 96 91  BUN 117* 119* 118*  CREATININE 12.46* 11.88* 11.81*  CALCIUM 7.8* 7.7* 8.0*   Recent Labs    11/13/20 2143  INR 1.5*   No results for input(s): LABURIN in the last 72 hours. Results for orders placed or performed during the hospital encounter of 11/13/20  Resp Panel by RT-PCR (Flu A&B, Covid) Nasopharyngeal Swab     Status: None   Collection Time: 11/13/20  7:30 PM   Specimen: Nasopharyngeal Swab; Nasopharyngeal(NP) swabs in vial transport medium  Result Value Ref Range Status   SARS Coronavirus 2 by RT PCR NEGATIVE NEGATIVE Final    Comment: (NOTE) SARS-CoV-2 target nucleic acids are NOT DETECTED.  The SARS-CoV-2 RNA is generally detectable in upper respiratory specimens during the acute phase of infection. The lowest concentration of SARS-CoV-2 viral copies this assay can detect is 138 copies/mL. A negative result does not preclude SARS-Cov-2 infection and should not be used as the sole basis  for treatment or other patient management decisions. A negative result may occur with  improper specimen collection/handling, submission of specimen other than nasopharyngeal swab, presence of viral mutation(s) within the areas targeted by this assay, and inadequate number of viral copies(<138 copies/mL). A negative result must be combined with clinical observations, patient history, and epidemiological information. The expected result is Negative.  Fact Sheet for Patients:  EntrepreneurPulse.com.au  Fact Sheet for Healthcare Providers:  IncredibleEmployment.be  This test is no t yet approved or cleared by the [place] FDA and  has been authorized for detection and/or diagnosis of SARS-CoV-2 by FDA under an Emergency Use Authorization (EUA). This EUA will remain  in effect (meaning this test can be used) for the duration of the COVID-19 declaration under Section 564(b)(1) of the Act, 21 U.S.C.section 360bbb-3(b)(1), unless the authorization is terminated  or revoked sooner.       Influenza A by PCR NEGATIVE NEGATIVE Final   Influenza B by PCR NEGATIVE NEGATIVE Final    Comment: (NOTE) The Xpert Xpress SARS-CoV-2/FLU/RSV plus assay is intended as an aid in the diagnosis of influenza from Nasopharyngeal swab specimens and should not be used as a sole basis for treatment. Nasal washings and aspirates are unacceptable for Xpert Xpress SARS-CoV-2/FLU/RSV testing.  Fact Sheet for Patients: EntrepreneurPulse.com.au  Fact Sheet for Healthcare Providers: IncredibleEmployment.be  This test is not yet approved or cleared by the [place] FDA and has been authorized for detection and/or diagnosis of SARS-CoV-2 by FDA under an Emergency Use Authorization (EUA). This EUA will remain in effect (meaning this test can be used) for the duration of the COVID-19 declaration under Section 564(b)(1) of the Act, 21  U.S.C. section 360bbb-3(b)(1), unless the authorization is terminated or revoked.  Performed at [facility], [place] [address], [place], [place] [address]   Urine Culture     Status: None   Collection Time: 11/13/20 10:23 PM   Specimen: Urine, Clean Catch  Result Value Ref Range Status   Specimen Description URINE, CLEAN CATCH  Final   Special Requests NONE  Final   Culture   Final    NO GROWTH Performed at [facility], [place] [address] [place], [place] [address]    Report Status 11/15/2020 FINAL  Final  Culture, blood (routine x 2)     Status: None (Preliminary result)   Collection Time: 11/14/20  9:19 AM   Specimen: BLOOD RIGHT FOREARM  Result Value Ref Range Status   Specimen Description BLOOD RIGHT FOREARM  Final   Special  Requests   Final    BOTTLES DRAWN AEROBIC AND ANAEROBIC Blood Culture results may not be optimal due to an excessive volume of blood received in culture bottles   Culture   Final    NO GROWTH < 24 HOURS Performed at [facility] [address] [place], [place] [address]    Report Status PENDING  Incomplete    Studies/Results: CT ABDOMEN PELVIS WO CONTRAST  Result Date: 11/13/2020 CLINICAL DATA:  Diarrhea swelling over proximal right clavicle EXAM: CT CHEST, ABDOMEN AND PELVIS WITHOUT CONTRAST TECHNIQUE: Multidetector CT imaging of the chest, abdomen and pelvis was performed following the standard protocol without IV contrast. COMPARISON:  CT 06/25/2012 FINDINGS: CT CHEST FINDINGS Cardiovascular: Limited evaluation without intravenous contrast. Aorta is nonaneurysmal. Normal cardiac size. No pericardial effusion. Mediastinum/Nodes: Midline trachea. No thyroid mass. No suspicious nodes. Esophagus normal Lungs/Pleura: Lungs are clear. No pleural effusion or pneumothorax. Musculoskeletal: Mild edema and soft tissue swelling/thickening centered at the right sternoclavicular joint. Irregular lucency within the head of the right clavicle. Similar  but less extensive changes involving head of left clavicle. Focal heterogeneous lucency within the right first rib, series 4, image 34. Bones appear diffusely dense CT ABDOMEN PELVIS FINDINGS Hepatobiliary: No focal liver abnormality is seen. No gallstones, gallbladder wall thickening, or biliary dilatation. Pancreas: Unremarkable. No pancreatic ductal dilatation or surrounding inflammatory changes. Spleen: Normal in size without focal abnormality. Adrenals/Urinary Tract: Adrenal glands are normal. Status post right nephrectomy. Severe left-sided hydronephrosis and hydroureter with tortuous left ureter dilated down to the level of the bladder. Dilated right ureteral remnant with cystic enlargement of the distal right ureter. Bladder is unremarkable. Stomach/Bowel: Stomach nonenlarged. No dilated small bowel. Negative appendix. No acute bowel wall thickening Vascular/Lymphatic: Nonaneurysmal aorta.  No suspicious nodes Reproductive: Prostate is unremarkable. Other: Negative for pelvic effusion or free air Musculoskeletal: Bones appear diffusely increased in density suggesting metabolic bone disease, possibly due to chronic kidney disease. 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 Interval resorptive changes at bilateral SI joints with some sclerosis. No inflammatory changes by CT. IMPRESSION: 1. Edema and soft tissue thickening centered the right sternoclavicular joint with heterogeneous lucency involving the head of the right clavicle. Similar changes involving head of left clavicle though less extensive. Findings could be secondary to osteomyelitis and infection with bone tumor an additional consideration. Further evaluation with MRI could be considered. Additional heterogeneous focal indeterminate lucent lesion in the right first rib posteriorly. 2. Status post right nephrectomy. Severe left-sided hydronephrosis and hydroureter down to the level of the bladder. Interval cortical atrophy of left kidney. There  is right ureteral remnant which is also dilated with cystic change distally. Electronically Signed   By: Donavan Foil M.D.   On: 11/13/2020 23:11   CT Chest Wo Contrast  Result Date: 11/13/2020 CLINICAL DATA:  Diarrhea swelling over proximal right clavicle EXAM: CT CHEST, ABDOMEN AND PELVIS WITHOUT CONTRAST TECHNIQUE: Multidetector CT imaging of the chest, abdomen and pelvis was performed following the standard protocol without IV contrast. COMPARISON:  CT 06/25/2012 FINDINGS: CT CHEST FINDINGS Cardiovascular: Limited evaluation without intravenous contrast. Aorta is nonaneurysmal. Normal cardiac size. No pericardial effusion. Mediastinum/Nodes: Midline trachea. No thyroid mass. No suspicious nodes. Esophagus normal Lungs/Pleura: Lungs are clear. No pleural effusion or pneumothorax. Musculoskeletal: Mild edema and soft tissue swelling/thickening centered at the right sternoclavicular joint. Irregular lucency within the head of the right clavicle. Similar but less extensive changes involving head of left clavicle. Focal heterogeneous lucency within the right first  rib, series 4, image 34. Bones appear diffusely dense CT ABDOMEN PELVIS FINDINGS Hepatobiliary: No focal liver abnormality is seen. No gallstones, gallbladder wall thickening, or biliary dilatation. Pancreas: Unremarkable. No pancreatic ductal dilatation or surrounding inflammatory changes. Spleen: Normal in size without focal abnormality. Adrenals/Urinary Tract: Adrenal glands are normal. Status post right nephrectomy. Severe left-sided hydronephrosis and hydroureter with tortuous left ureter dilated down to the level of the bladder. Dilated right ureteral remnant with cystic enlargement of the distal right ureter. Bladder is unremarkable. Stomach/Bowel: Stomach nonenlarged. No dilated small bowel. Negative appendix. No acute bowel wall thickening Vascular/Lymphatic: Nonaneurysmal aorta.  No suspicious nodes Reproductive: Prostate is unremarkable.  Other: Negative for pelvic effusion or free air Musculoskeletal: Bones appear diffusely increased in density suggesting metabolic bone disease, possibly due to chronic kidney disease. 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 Interval resorptive changes at bilateral SI joints with some sclerosis. No inflammatory changes by CT. IMPRESSION: 1. Edema and soft tissue thickening centered the right sternoclavicular joint with heterogeneous lucency involving the head of the right clavicle. Similar changes involving head of left clavicle though less extensive. Findings could be secondary to osteomyelitis and infection with bone tumor an additional consideration. Further evaluation with MRI could be considered. Additional heterogeneous focal indeterminate lucent lesion in the right first rib posteriorly. 2. Status post right nephrectomy. Severe left-sided hydronephrosis and hydroureter down to the level of the bladder. Interval cortical atrophy of left kidney. There is right ureteral remnant which is also dilated with cystic change distally. Electronically Signed   By: Donavan Foil M.D.   On: 11/13/2020 23:11   DG Chest Portable 1 View  Result Date: 11/13/2020 CLINICAL DATA:  Evaluate proximal right clavicular head. EXAM: PORTABLE CHEST 1 VIEW COMPARISON:  None. FINDINGS: No gross abnormality of the proximal right clavicular head is evident within the limitations of a single portable frontal radiograph of the chest. Low lung volumes with streaky opacities favoring atelectatic change. No consolidative process. No pneumothorax or layering effusion. The cardiomediastinal contours are unremarkable. No acute osseous or soft tissue abnormality. Telemetry leads overlie the chest. IMPRESSION: No gross abnormality of the proximal right clavicular head though evaluation is limited on this single view portable chest radiograph. Could consider dedicated clavicular radiographs or cross-sectional imaging if there is persisting  clinical concern. Electronically Signed   By: Lovena Le M.D.   On: 11/13/2020 22:36   VAS [place] LOWER EXTREMITY VENOUS (DVT)  Result Date: 11/14/2020  Lower Venous DVT Study Patient Name:  Micheal Bean  Date of Exam:   11/14/2020 Medical Rec #: [id number]      Accession #:    [id number] Date of Birth: 06/18/1985      Patient Gender: M Patient Age:   80 years Exam Location[facility]  [facility] Procedure:      VAS [place] LOWER EXTREMITY VENOUS (DVT) Referring Phys: Sheppard Coil MELVIN --------------------------------------------------------------------------------  Indications: Elevated D-dimer.  Comparison Study: No previous exams Performing Technologist: Darlin Coco  Examination Guidelines: A complete evaluation includes B-mode imaging, spectral Doppler, color Doppler, and power Doppler as needed of all accessible portions of each vessel. Bilateral testing is considered an integral part of a complete examination. Limited examinations for reoccurring indications may be performed as noted. The reflux portion of the exam is performed with the patient in reverse Trendelenburg.  +---------+---------------+---------+-----------+----------+--------------+ RIGHT    CompressibilityPhasicitySpontaneityPropertiesThrombus Aging +---------+---------------+---------+-----------+----------+--------------+ CFV      Full           Yes  Yes                                 +---------+---------------+---------+-----------+----------+--------------+ SFJ      Full                                                        +---------+---------------+---------+-----------+----------+--------------+ FV Prox  Full                                                        +---------+---------------+---------+-----------+----------+--------------+ FV Mid   Full                                                        +---------+---------------+---------+-----------+----------+--------------+ FV DistalFull                                                         +---------+---------------+---------+-----------+----------+--------------+ PFV      Full                                                        +---------+---------------+---------+-----------+----------+--------------+ POP      Full           Yes      Yes                                 +---------+---------------+---------+-----------+----------+--------------+ PTV      Full                                                        +---------+---------------+---------+-----------+----------+--------------+ PERO     Full                                                        +---------+---------------+---------+-----------+----------+--------------+ Gastroc  Full                                                        +---------+---------------+---------+-----------+----------+--------------+   +---------+---------------+---------+-----------+----------+--------------+ LEFT     CompressibilityPhasicitySpontaneityPropertiesThrombus Aging +---------+---------------+---------+-----------+----------+--------------+ CFV      Full  Yes      Yes                                 +---------+---------------+---------+-----------+----------+--------------+ SFJ      Full                                                        +---------+---------------+---------+-----------+----------+--------------+ FV Prox  Full                                                        +---------+---------------+---------+-----------+----------+--------------+ FV Mid   Full                                                        +---------+---------------+---------+-----------+----------+--------------+ FV DistalFull                                                        +---------+---------------+---------+-----------+----------+--------------+ PFV      Full                                                         +---------+---------------+---------+-----------+----------+--------------+ POP      Full           Yes      Yes                                 +---------+---------------+---------+-----------+----------+--------------+ PTV      Full                                                        +---------+---------------+---------+-----------+----------+--------------+ PERO     Full                                                        +---------+---------------+---------+-----------+----------+--------------+ Gastroc  Full                                                        +---------+---------------+---------+-----------+----------+--------------+     Summary: BILATERAL: - No evidence of deep vein thrombosis  seen in the lower extremities, bilaterally. - No evidence of superficial venous thrombosis in the lower extremities, bilaterally. -No evidence of popliteal cyst, bilaterally.   *See table(s) above for measurements and observations.    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 Chronic stage III renal insufficiency   Plan: Agree with nephrology assessment. Unfortunately his CT showed significant renal cortical scarring indicating the obstruction is chronic rather than acute. Renal function may not improve and he may require dialysis. Continue foley and trend Cr.   Link Snuffer, MD Urology 11/15/2020, 11:50 AM

## 2020-11-15 NOTE — TOC Initial Note (Signed)
Transition of Care [facility]) - Initial/Assessment Note    Patient Details  Name: SILIS GAETZ MRN: [id number] Date of Birth: 07/25/1985  [facility] CM/SW Contact:    Bartholomew Crews, RN Phone Number: [phone] 11/15/2020, 2:44 PM  Clinical Narrative:                  Spoke with patient and and his sister, Governor Raygoza, at patient bedside. Creta Levin wanting to be supportive of her brother and assist with resources, like Medicaid and PCP, and asking about follow up with previous doctors at [facility]. Patient stated that he may be starting hemodialysis on Monday.   Will send email to financial counselor concerning patient's medical status and need for Medicaid. Discussed follow up with [facility] to establish for primary care. Will follow for MATCH needs at discharge.   TOC following.   Expected Discharge Plan: Home/Self Care Barriers to Discharge: Continued Medical Work up   Patient Goals and CMS Choice        Expected Discharge Plan and Services Expected Discharge Plan: Home/Self Care                                              Prior Living Arrangements/Services                       Activities of Daily Living Home Assistive Devices/Equipment: None    Permission Sought/Granted                  Emotional Assessment              Admission diagnosis:  Acute renal failure (ARF) ([place]) [N17.9] AKI (acute kidney injury) ([place]) [N17.9] Anemia, unspecified type [D64.9] Patient Active Problem List   Diagnosis Date Noted   AKI (acute kidney injury) ([place]) 11/14/2020   Osteomyelitis ([place])    Acute renal failure (ARF) ([place]) 11/13/2020   Acute lower UTI 11/13/2020   Leukocytosis 11/13/2020   Bone lesion 11/13/2020   PCP:  Patient, No Pcp Per (Inactive) Pharmacy:   [id number] [id number] [id number]:  [phone]: [phone]    Social Determinants of Health (SDOH) Interventions    Readmission Risk Interventions No flowsheet data found.

## 2020-11-15 NOTE — Progress Notes (Addendum)
PROGRESS NOTE    Micheal Bean  [id number] DOB: November 25, 1985 DOA: 11/13/2020 PCP: Patient, No Pcp Per (Inactive)    Chief Complaint  Patient presents with   Dizziness    Brief Narrative:  Micheal Bean is Micheal Bean 35 y.o. male with medical history significant of history of right nephrectomy as Micheal Bean infant presenting with constellation of intermittent symptoms including headache, lightheadedness and chest tightness, As above has had 3-4 days of head ache, lightheadedness, chest tightness, nausea, diarrhea.  He tried taking some fluids at home did seem to help he also tried taking Zahria Ding dose of his mother's amlodipine but did not seem to help.  He does feel better laying down but it feels worse standing up.  He does note losing 20 pounds in the last 2 months.  He was seen in urgent care and then sent to the ED for further evaluation.  He's been admitted for aki on ckd, left sided hydro with possible UTI, severe anemia, and leukocytosis.  Assessment & Plan:   Principal Problem:   Acute renal failure (ARF) (HCC) Active Problems:   Acute lower UTI   Leukocytosis   Bone lesion   AKI (acute kidney injury) ([place])   Osteomyelitis (HCC)  Addendum Sister requesting possible transfer to [facility].  I think family overall concerned/overwhelmed with his acute kidney disease, possibility of dialysis.  Asking about transfer to [place].  Discussed at this point, I don't have Anis Degidio medical reason for transfer.  I think there are benefits to seeing local doctors.  I think we can take care of [name] here and if I think we get to limits of what we're able to do here, I will certainly let him and his family know and recommend [name] transfer at that point.  At this point, any transfer would be Micheal Bean family request, not for [name] medical indication.  I've asked renal to discuss [name] bit with family tomorrow.  I can call regarding possibility of transfer if that's what family really wants, but there's no current medical indication.  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 - urology c/s, appreciate recommendations - foley - may need cystoscopy/retrograde study if creatinine not improving/hydro not improving > Nephrology consulted, appreciate recs - per renal, unclear hpw much improvement possible, may represent CKD progression - if no improvement by Monday, plan on dialysis - SPEP, UPEP, free light chains - Appreciate nephrology recommendations - Continue with catheter placement - Trend renal function and electrolytes - incrementally, but not significantly improved today   Iron Deficiency Anemia  Anemia of Chronic Disease > Noted to have significant anemia with [name] hemoglobin of 6. > iron deficiency, elevated ferritin - b12 and folate low normal - follow MMA, supplement b12/folate for now - will need iron supplementation - LDH wnl, haptoglobin pending - suspect related to iron def anemia and his kidney disease - no hx dark or blood stool - Check FOBT  - s/p 3 unit pRBC  Leukocytosis  Edema and Soft Tissue Thickening around R [place] Joint  Urinary Tract Infection Concern for UTI, but edema and soft tissue thickening around R [place] joint with heterogenous lucency involving head of R clavicle (similar changes involving head of L clavicle  and focal indeterminate lucent lesion in R first rib) --- concerning for infection/osteo Currently on ceftriaxone to cover for UTI (UA with squams, but also nitrites) Urine culture pending Follow blood cultures (both sets unfortunately collected after abx, follow) ID recommending dapto/ceftriaxone CT surgery c/s to consider aspiration of R [place] joint, they did not feel any evidence of drainable fluid collection and recommended medical  management  Edema and soft tissue thickening around R sternoclavicular joint with heterogenous lucency involving the head of the R clavicle with similar changes involving head of L clavicle  Indeterminate lucent lesion in R first rib posteriorly Concerning for infection as noted above, malignancy also on differential Will consider imaging with MRI  May need to discuss with heme/onc  Elevated D Dimer Suspect related to inflammation  Negative LE [place] for DVT Consider VQ scan, but no evidence of hypoxia, no CP - suspect dimer related to other systemic issues, but low threshold for further w/u    DVT prophylaxis: SCD Code Status: full  Family Communication: none at bedside Disposition:   Status is: Observation  The patient will require care spanning > 2 midnights and should be moved to inpatient because: Inpatient level of care appropriate due to severity of illness  Dispo: The patient is from: Home              Anticipated d/c is to: Home              Patient currently is not medically stable to d/c.   Difficult to place patient No       Consultants:  Nephrology CT surgery Urology ID  Procedures:  LE [place] Summary:  BILATERAL:  - No evidence of deep vein thrombosis seen in the lower extremities,  bilaterally.  - No evidence of superficial venous thrombosis in the lower extremities,  bilaterally.  -No evidence of popliteal cyst, bilaterally.   Antimicrobials: Anti-infectives (From admission, onward)    Start     Dose/Rate Route Frequency Ordered Stop   11/14/20 2100  DAPTOmycin (CUBICIN) 700 mg in sodium chloride 0.9 % IVPB        8 mg/kg  85.2 kg (Adjusted) 128 mL/hr over 30 Minutes Intravenous Every 48 hours 11/14/20 2006     11/14/20 2045  cefTRIAXone (ROCEPHIN) 2 g in sodium chloride 0.9 % 100 mL IVPB        2 g 200 mL/hr over 30 Minutes Intravenous Every 24 hours 11/14/20 1948     11/13/20 2230  cefTRIAXone (ROCEPHIN) 1 g in sodium chloride 0.9 % 100 mL IVPB   Status:  Discontinued        1 g 200 mL/hr over 30 Minutes Intravenous Every 24 hours 11/13/20 2223 11/14/20 1948          Subjective: No complaints  Objective: Vitals:   11/14/20 2100 11/14/20 2144 11/14/20 2154 11/15/20 0514  BP: 120/80 125/84  128/78  Pulse: 86 82  77  Resp: '16 17  19  '$ Temp: 98.5 F (36.9 C) 98.8 F (37.1 C)  98.6 F (37 C)  TempSrc: Oral Oral  Oral  SpO2: 100% 100%  100%  Weight:   79.8 kg   Height:        Intake/Output Summary (Last 24 hours) at 11/15/2020 1017 Last data filed at 11/15/2020 0500 Gross per 24 hour  Intake 3103.9 ml  Output 2650 ml  Net 453.9 ml   Filed Weights   11/14/20 0904 11/14/20 2154  Weight: 86.2 kg 79.8 kg  Examination:  General: No acute distress. Cardiovascular: RRR Lungs: unlabored Abdomen: Soft, nontender, nondistended Neurological: Alert and oriented 3. Moves all extremities 4 . Cranial nerves II through XII grossly intact. Skin: Warm and dry. No rashes or lesions. Extremities: No clubbing or cyanosis. No edema.    Data Reviewed: I have personally reviewed following labs and imaging studies  CBC: Recent Labs  Lab 11/13/20 1951 11/14/20 0000 11/14/20 0542 11/15/20 0636  WBC 29.9*  --  29.8* 23.7*  NEUTROABS 28.3*  --   --  20.3*  HGB 6.0* 6.8* 6.6* 8.6*  HCT 19.9* 20.0* 20.9* 25.1*  MCV 93.9  --  91.7 86.3  PLT 324  --  276 [id number]    Basic Metabolic Panel: Recent Labs  Lab 11/13/20 1951 11/13/20 2143 11/14/20 0000 11/14/20 0500 11/14/20 2326 11/15/20 0636  NA 131*  --  134* 134* 134* 136  K 4.1  --  4.5 4.0 2.9* 2.9*  CL 106  --   --  103 99 101  CO2 10*  --   --  11* 15* 16*  GLUCOSE 118*  --   --  101* 96 91  BUN 111*  --   --  117* 119* 118*  CREATININE 12.59*  --   --  12.46* 11.88* 11.81*  CALCIUM 8.0*  --   --  7.8* 7.7* 8.0*  MG  --  1.4*  --   --   --  1.5*  PHOS  --  8.8*  --  9.7*  --  11.8*    GFR: Estimated Creatinine Clearance: 10.1 mL/min (Jeffifer Rabold) (by C-G formula based  on SCr of 11.81 mg/dL (H)).  Liver Function Tests: Recent Labs  Lab 11/13/20 1951 11/14/20 0500 11/15/20 0636  AST 13*  --  8*  ALT 9  --  9  ALKPHOS 112  --  100  BILITOT 0.2*  --  0.7  PROT 7.2  --  6.5  ALBUMIN 2.7* 2.6* 2.4*    CBG: No results for input(s): GLUCAP in the last 168 hours.   Recent Results (from the past 240 hour(s))  Resp Panel by RT-PCR (Flu Tydus Sanmiguel&B, Covid) Nasopharyngeal Swab     Status: None   Collection Time: 11/13/20  7:30 PM   Specimen: Nasopharyngeal Swab; Nasopharyngeal(NP) swabs in vial transport medium  Result Value Ref Range Status   SARS Coronavirus 2 by RT PCR NEGATIVE NEGATIVE Final    Comment: (NOTE) SARS-CoV-2 target nucleic acids are NOT DETECTED.  The SARS-CoV-2 RNA is generally detectable in upper respiratory specimens during the acute phase of infection. The lowest concentration of SARS-CoV-2 viral copies this assay can detect is 138 copies/mL. Damyra Luscher negative result does not preclude SARS-Cov-2 infection and should not be used as the sole basis for treatment or other patient management decisions. Calton Harshfield negative result may occur with  improper specimen collection/handling, submission of specimen other than nasopharyngeal swab, presence of viral mutation(s) within the areas targeted by this assay, and inadequate number of viral copies(<138 copies/mL). Alfretta Pinch negative result must be combined with clinical observations, patient history, and epidemiological information. The expected result is Negative.  Fact Sheet for Patients:  EntrepreneurPulse.com.au  Fact Sheet for Healthcare Providers:  IncredibleEmployment.be  This test is no t yet approved or cleared by the [place] FDA and  has been authorized for detection and/or diagnosis of SARS-CoV-2 by FDA under an Emergency Use Authorization (EUA). This EUA will remain  in effect (meaning this test can be used) for the duration  of the COVID-19 declaration under  Section 564(b)(1) of the Act, 21 U.S.C.section 360bbb-3(b)(1), unless the authorization is terminated  or revoked sooner.       Influenza Shade Kaley by PCR NEGATIVE NEGATIVE Final   Influenza B by PCR NEGATIVE NEGATIVE Final    Comment: (NOTE) The Xpert Xpress SARS-CoV-2/FLU/RSV plus assay is intended as an aid in the diagnosis of influenza from Nasopharyngeal swab specimens and should not be used as Tremar Wickens sole basis for treatment. Nasal washings and aspirates are unacceptable for Xpert Xpress SARS-CoV-2/FLU/RSV testing.  Fact Sheet for Patients: EntrepreneurPulse.com.au  Fact Sheet for Healthcare Providers: IncredibleEmployment.be  This test is not yet approved or cleared by the [place] FDA and has been authorized for detection and/or diagnosis of SARS-CoV-2 by FDA under an Emergency Use Authorization (EUA). This EUA will remain in effect (meaning this test can be used) for the duration of the COVID-19 declaration under Section 564(b)(1) of the Act, 21 U.S.C. section 360bbb-3(b)(1), unless the authorization is terminated or revoked.  Performed at [facility], [place] [address], [place], [place] [address]   Urine Culture     Status: None   Collection Time: 11/13/20 10:23 PM   Specimen: Urine, Clean Catch  Result Value Ref Range Status   Specimen Description URINE, CLEAN CATCH  Final   Special Requests NONE  Final   Culture   Final    NO GROWTH Performed at [facility], [place] [address], [place], [place] [address]    Report Status 11/15/2020 FINAL  Final  Culture, blood (routine x 2)     Status: None (Preliminary result)   Collection Time: 11/14/20  9:19 AM   Specimen: BLOOD RIGHT FOREARM  Result Value Ref Range Status   Specimen Description BLOOD RIGHT FOREARM  Final   Special Requests   Final    BOTTLES DRAWN AEROBIC AND ANAEROBIC Blood Culture results may not be optimal due to an excessive volume of blood received in culture  bottles   Culture   Final    NO GROWTH < 12 HOURS Performed at [facility], [place] [address] [place], [place] [address]    Report Status PENDING  Incomplete         Radiology Studies: CT ABDOMEN PELVIS WO CONTRAST  Result Date: 11/13/2020 CLINICAL DATA:  Diarrhea swelling over proximal right clavicle EXAM: CT CHEST, ABDOMEN AND PELVIS WITHOUT CONTRAST TECHNIQUE: Multidetector CT imaging of the chest, abdomen and pelvis was performed following the standard protocol without IV contrast. COMPARISON:  CT 06/25/2012 FINDINGS: CT CHEST FINDINGS Cardiovascular: Limited evaluation without intravenous contrast. Aorta is nonaneurysmal. Normal cardiac size. No pericardial effusion. Mediastinum/Nodes: Midline trachea. No thyroid mass. No suspicious nodes. Esophagus normal Lungs/Pleura: Lungs are clear. No pleural effusion or pneumothorax. Musculoskeletal: Mild edema and soft tissue swelling/thickening centered at the right sternoclavicular joint. Irregular lucency within the head of the right clavicle. Similar but less extensive changes involving head of left clavicle. Focal heterogeneous lucency within the right first rib, series 4, image 34. Bones appear diffusely dense CT ABDOMEN PELVIS FINDINGS Hepatobiliary: No focal liver abnormality is seen. No gallstones, gallbladder wall thickening, or biliary dilatation. Pancreas: Unremarkable. No pancreatic ductal dilatation or surrounding inflammatory changes. Spleen: Normal in size without focal abnormality. Adrenals/Urinary Tract: Adrenal glands are normal. Status post right nephrectomy. Severe left-sided hydronephrosis and hydroureter with tortuous left ureter dilated down to the level of the bladder. Dilated right ureteral remnant with cystic enlargement of the distal right ureter. Bladder is  unremarkable. Stomach/Bowel: Stomach nonenlarged. No dilated small bowel. Negative appendix. No acute bowel wall thickening Vascular/Lymphatic: Nonaneurysmal aorta.  No  suspicious nodes Reproductive: Prostate is unremarkable. Other: Negative for pelvic effusion or free air Musculoskeletal: Bones appear diffusely increased in density suggesting metabolic bone disease, possibly due to chronic kidney disease. 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 Interval resorptive changes at bilateral SI joints with some sclerosis. No inflammatory changes by CT. IMPRESSION: 1. Edema and soft tissue thickening centered the right sternoclavicular joint with heterogeneous lucency involving the head of the right clavicle. Similar changes involving head of left clavicle though less extensive. Findings could be secondary to osteomyelitis and infection with bone tumor an additional consideration. Further evaluation with MRI could be considered. Additional heterogeneous focal indeterminate lucent lesion in the right first rib posteriorly. 2. Status post right nephrectomy. Severe left-sided hydronephrosis and hydroureter down to the level of the bladder. Interval cortical atrophy of left kidney. There is right ureteral remnant which is also dilated with cystic change distally. Electronically Signed   By: Donavan Foil M.D.   On: 11/13/2020 23:11   CT Chest Wo Contrast  Result Date: 11/13/2020 CLINICAL DATA:  Diarrhea swelling over proximal right clavicle EXAM: CT CHEST, ABDOMEN AND PELVIS WITHOUT CONTRAST TECHNIQUE: Multidetector CT imaging of the chest, abdomen and pelvis was performed following the standard protocol without IV contrast. COMPARISON:  CT 06/25/2012 FINDINGS: CT CHEST FINDINGS Cardiovascular: Limited evaluation without intravenous contrast. Aorta is nonaneurysmal. Normal cardiac size. No pericardial effusion. Mediastinum/Nodes: Midline trachea. No thyroid mass. No suspicious nodes. Esophagus normal Lungs/Pleura: Lungs are clear. No pleural effusion or pneumothorax. Musculoskeletal: Mild edema and soft tissue swelling/thickening centered at the right sternoclavicular joint.  Irregular lucency within the head of the right clavicle. Similar but less extensive changes involving head of left clavicle. Focal heterogeneous lucency within the right first rib, series 4, image 34. Bones appear diffusely dense CT ABDOMEN PELVIS FINDINGS Hepatobiliary: No focal liver abnormality is seen. No gallstones, gallbladder wall thickening, or biliary dilatation. Pancreas: Unremarkable. No pancreatic ductal dilatation or surrounding inflammatory changes. Spleen: Normal in size without focal abnormality. Adrenals/Urinary Tract: Adrenal glands are normal. Status post right nephrectomy. Severe left-sided hydronephrosis and hydroureter with tortuous left ureter dilated down to the level of the bladder. Dilated right ureteral remnant with cystic enlargement of the distal right ureter. Bladder is unremarkable. Stomach/Bowel: Stomach nonenlarged. No dilated small bowel. Negative appendix. No acute bowel wall thickening Vascular/Lymphatic: Nonaneurysmal aorta.  No suspicious nodes Reproductive: Prostate is unremarkable. Other: Negative for pelvic effusion or free air Musculoskeletal: Bones appear diffusely increased in density suggesting metabolic bone disease, possibly due to chronic kidney disease. 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 Interval resorptive changes at bilateral SI joints with some sclerosis. No inflammatory changes by CT. IMPRESSION: 1. Edema and soft tissue thickening centered the right sternoclavicular joint with heterogeneous lucency involving the head of the right clavicle. Similar changes involving head of left clavicle though less extensive. Findings could be secondary to osteomyelitis and infection with bone tumor an additional consideration. Further evaluation with MRI could be considered. Additional heterogeneous focal indeterminate lucent lesion in the right first rib posteriorly. 2. Status post right nephrectomy. Severe left-sided hydronephrosis and hydroureter down to the level  of the bladder. Interval cortical atrophy of left kidney. There is right ureteral remnant which is also dilated with cystic change distally. Electronically Signed   By: Donavan Foil M.D.   On: 11/13/2020 23:11   DG Chest Portable 1 View  Result Date: 11/13/2020 CLINICAL DATA:  Evaluate proximal right clavicular head. EXAM: PORTABLE CHEST 1 VIEW COMPARISON:  None. FINDINGS: No gross abnormality of the proximal right clavicular head is evident within the limitations of Taraann Olthoff single portable frontal radiograph of the chest. Low lung volumes with streaky opacities favoring atelectatic change. No consolidative process. No pneumothorax or layering effusion. The cardiomediastinal contours are unremarkable. No acute osseous or soft tissue abnormality. Telemetry leads overlie the chest. IMPRESSION: No gross abnormality of the proximal right clavicular head though evaluation is limited on this single view portable chest radiograph. Could consider dedicated clavicular radiographs or cross-sectional imaging if there is persisting clinical concern. Electronically Signed   By: Lovena Le M.D.   On: 11/13/2020 22:36   VAS [place] LOWER EXTREMITY VENOUS (DVT)  Result Date: 11/14/2020  Lower Venous DVT Study Patient Name:  KINGSTAN VENTURA  Date of Exam:   11/14/2020 Medical Rec #: [id number]      Accession #:    [id number] Date of Birth: [birth year]      Patient Gender: M Patient Age:   30 years Exam Location:  [facility] Procedure:      VAS [place] LOWER EXTREMITY VENOUS (DVT) Referring Phys: Sheppard Coil MELVIN --------------------------------------------------------------------------------  Indications: Elevated D-dimer.  Comparison Study: No previous exams Performing Technologist: Darlin Coco  Examination Guidelines: Marylynne Keelin complete evaluation includes B-mode imaging, spectral Doppler, color Doppler, and power Doppler as needed of all accessible portions of each vessel. Bilateral testing is considered an integral part of Lucus Lambertson complete  examination. Limited examinations for reoccurring indications may be performed as noted. The reflux portion of the exam is performed with the patient in reverse Trendelenburg.  +---------+---------------+---------+-----------+----------+--------------+ RIGHT    CompressibilityPhasicitySpontaneityPropertiesThrombus Aging +---------+---------------+---------+-----------+----------+--------------+ CFV      Full           Yes      Yes                                 +---------+---------------+---------+-----------+----------+--------------+ SFJ      Full                                                        +---------+---------------+---------+-----------+----------+--------------+ FV Prox  Full                                                        +---------+---------------+---------+-----------+----------+--------------+ FV Mid   Full                                                        +---------+---------------+---------+-----------+----------+--------------+ FV DistalFull                                                        +---------+---------------+---------+-----------+----------+--------------+ PFV  Full                                                        +---------+---------------+---------+-----------+----------+--------------+ POP      Full           Yes      Yes                                 +---------+---------------+---------+-----------+----------+--------------+ PTV      Full                                                        +---------+---------------+---------+-----------+----------+--------------+ PERO     Full                                                        +---------+---------------+---------+-----------+----------+--------------+ Gastroc  Full                                                        +---------+---------------+---------+-----------+----------+--------------+    +---------+---------------+---------+-----------+----------+--------------+ LEFT     CompressibilityPhasicitySpontaneityPropertiesThrombus Aging +---------+---------------+---------+-----------+----------+--------------+ CFV      Full           Yes      Yes                                 +---------+---------------+---------+-----------+----------+--------------+ SFJ      Full                                                        +---------+---------------+---------+-----------+----------+--------------+ FV Prox  Full                                                        +---------+---------------+---------+-----------+----------+--------------+ FV Mid   Full                                                        +---------+---------------+---------+-----------+----------+--------------+ FV DistalFull                                                        +---------+---------------+---------+-----------+----------+--------------+  PFV      Full                                                        +---------+---------------+---------+-----------+----------+--------------+ POP      Full           Yes      Yes                                 +---------+---------------+---------+-----------+----------+--------------+ PTV      Full                                                        +---------+---------------+---------+-----------+----------+--------------+ PERO     Full                                                        +---------+---------------+---------+-----------+----------+--------------+ Gastroc  Full                                                        +---------+---------------+---------+-----------+----------+--------------+     Summary: BILATERAL: - No evidence of deep vein thrombosis seen in the lower extremities, bilaterally. - No evidence of superficial venous thrombosis in the lower extremities, bilaterally. -No  evidence of popliteal cyst, bilaterally.   *See table(s) above for measurements and observations.    Preliminary         Scheduled Meds:  Chlorhexidine Gluconate Cloth  6 each Topical Q0600   sodium chloride flush  3 mL Intravenous Q12H   vitamin B-12  1,000 mcg Oral Daily   Continuous Infusions:  sodium chloride     cefTRIAXone (ROCEPHIN)  IV 2 g (11/14/20 2117)   DAPTOmycin (CUBICIN)  IV 700 mg (11/15/20 0015)     LOS: 1 day    Time spent: over 30 min    Fayrene Helper, MD [facility]   To contact the attending provider between 7A-7P or the covering provider during after hours 7P-7A, please log into the web site [email or web address] and access using universal [place] password for that web site. If you do not have the password, please call the hospital operator.  11/15/2020, 10:17 AM

## 2020-11-16 LAB — CBC WITH DIFFERENTIAL/PLATELET
Abs Immature Granulocytes: 0.2 10*3/uL — ABNORMAL HIGH (ref 0.00–0.07)
Basophils Absolute: 0.1 10*3/uL (ref 0.0–0.1)
Basophils Relative: 0 %
Eosinophils Absolute: 0.2 10*3/uL (ref 0.0–0.5)
Eosinophils Relative: 1 %
HCT: 26.2 % — ABNORMAL LOW (ref 39.0–52.0)
Hemoglobin: 8.8 g/dL — ABNORMAL LOW (ref 13.0–17.0)
Immature Granulocytes: 1 %
Lymphocytes Relative: 8 %
Lymphs Abs: 1.6 10*3/uL (ref 0.7–4.0)
MCH: 28.9 pg (ref 26.0–34.0)
MCHC: 33.6 g/dL (ref 30.0–36.0)
MCV: 85.9 fL (ref 80.0–100.0)
Monocytes Absolute: 1.3 10*3/uL — ABNORMAL HIGH (ref 0.1–1.0)
Monocytes Relative: 7 %
Neutro Abs: 16.5 10*3/uL — ABNORMAL HIGH (ref 1.7–7.7)
Neutrophils Relative %: 83 %
Platelets: 269 10*3/uL (ref 150–400)
RBC: 3.05 MIL/uL — ABNORMAL LOW (ref 4.22–5.81)
RDW: 14.3 % (ref 11.5–15.5)
WBC: 19.8 10*3/uL — ABNORMAL HIGH (ref 4.0–10.5)
nRBC: 0 % (ref 0.0–0.2)

## 2020-11-16 LAB — COMPREHENSIVE METABOLIC PANEL
ALT: 9 U/L (ref 0–44)
AST: 8 U/L — ABNORMAL LOW (ref 15–41)
Albumin: 2.4 g/dL — ABNORMAL LOW (ref 3.5–5.0)
Alkaline Phosphatase: 107 U/L (ref 38–126)
Anion gap: 19 — ABNORMAL HIGH (ref 5–15)
BUN: 117 mg/dL — ABNORMAL HIGH (ref 6–20)
CO2: 16 mmol/L — ABNORMAL LOW (ref 22–32)
Calcium: 9.2 mg/dL (ref 8.9–10.3)
Chloride: 102 mmol/L (ref 98–111)
Creatinine, Ser: 11.9 mg/dL — ABNORMAL HIGH (ref 0.61–1.24)
GFR, Estimated: 5 mL/min — ABNORMAL LOW (ref >60)
Glucose, Bld: 86 mg/dL (ref 70–99)
Potassium: 3.7 mmol/L (ref 3.5–5.1)
Sodium: 137 mmol/L (ref 135–145)
Total Bilirubin: 0.9 mg/dL (ref 0.3–1.2)
Total Protein: 6.8 g/dL (ref 6.5–8.1)

## 2020-11-16 LAB — PHOSPHORUS: Phosphorus: 11.3 mg/dL — ABNORMAL HIGH (ref 2.5–4.6)

## 2020-11-16 LAB — MAGNESIUM: Magnesium: 1.9 mg/dL (ref 1.7–2.4)

## 2020-11-16 MED ORDER — NEPRO/CARBSTEADY PO LIQD
237.0000 mL | ORAL | Status: DC
  Administered 2020-11-16 – 2020-11-20 (×3): 237 mL via ORAL

## 2020-11-16 MED ORDER — CEFAZOLIN SODIUM-DEXTROSE 2-4 GM/100ML-% IV SOLN: 2.0000 g | INTRAVENOUS | Status: DC

## 2020-11-16 MED ORDER — RENA-VITE PO TABS
1.0000 | ORAL_TABLET | Freq: Every day | ORAL | Status: DC
  Administered 2020-11-16 – 2020-11-23 (×8): 1 via ORAL
  Filled 2020-11-16 (×8): qty 1

## 2020-11-16 MED ORDER — CHLORHEXIDINE GLUCONATE CLOTH 2 % EX PADS
6.0000 | MEDICATED_PAD | Freq: Every day | CUTANEOUS | Status: DC
  Administered 2020-11-18: 6 via TOPICAL

## 2020-11-16 MED ORDER — SODIUM CHLORIDE 0.9 % IV SOLN
250.0000 mg | Freq: Every day | INTRAVENOUS | Status: AC
  Administered 2020-11-16 – 2020-11-17 (×2): 250 mg via INTRAVENOUS
  Filled 2020-11-16 (×3): qty 20

## 2020-11-16 MED ORDER — DARBEPOETIN ALFA 40 MCG/0.4ML IJ SOSY
40.0000 ug | PREFILLED_SYRINGE | INTRAMUSCULAR | Status: DC
  Administered 2020-11-16: 40 ug via SUBCUTANEOUS
  Filled 2020-11-16: qty 0.4

## 2020-11-16 NOTE — Progress Notes (Signed)
Initial Nutrition Assessment  DOCUMENTATION CODES:  Not applicable  INTERVENTION:  Adjust diet to 2g Na with '600mg'$  phosphorus restriction Educate pt on renal diet principles prior to discharge Nepro Shake po 1x/d, each supplement provides 425 kcal and 19 grams protein Renavite daily  NUTRITION DIAGNOSIS:  Inadequate oral intake related to decreased appetite as evidenced by per patient/family report.  GOAL:  Patient will meet greater than or equal to 90% of their needs  MONITOR:  PO intake, Labs, I & O's, Weight trends  REASON FOR ASSESSMENT:  Consult Diet education (renal diet)  ASSESSMENT:  35 year old male with a past medical history of single kidney presented to ED with 3 to 4 weeks of worsening fatigue and unintentional weight loss. Pt found to have an AKI and imaging showed severe left hydronephrosis and hydroureter to the bladder.  Urology was consulted for foley placement as it was unsuccessful at bedside. 1098m of urine removed in ED after insertion. Nephrology consulted and determined that pt likely had pre-existing CKD which has progressed and HD is planned to be initiated if improvement is not shown with treatment. Pt does not have a PCP and was unaware of renal issues and anemia.  RD consulted to provide renal diet education, attempted to call room phone, no answer at this time. Reviewed chart for intake trends and weight hx. Appetite appears to be overall good with ~70% average meal consumption. Limited weight hx available as pt does not receive regular primary care.   Noted pt on renal diet but K has been low. Phosphorus significantly elevated. Discussed with attending and nephrology, will adjust diet to restrict phosphorus and Na but liberalize K. Attached information on low phosphorus diet and foods to limit in discharge instructions. Pt would benefit from in-person follow-up to review diet prior to discharge.   Renal function remains poor, will likely have HD  initiated.  Average Meal Intake: 8/11-8/14: 73% intake x 3 recorded meals (50-90%)  Nutritionally Relevant Medications: Scheduled Meds:  sevelamer carbonate  800 mg Oral TID WC   sodium bicarbonate  650 mg Oral TID   vitamin B-12  1,000 mcg Oral Daily   Continuous Infusions:  sodium chloride     cefTRIAXone (ROCEPHIN)  IV 2 g (11/15/20 2121)   DAPTOmycin (CUBICIN)  IV 700 mg (11/15/20 0015)   PRN Meds: polyethylene glycol  Labs Reviewed: BUN 117, creatinine 11.9 Calcium 10.5 (corrected for low albumin) Phosphorus 11.3  NUTRITION - FOCUSED PHYSICAL EXAM: Defer to in-person assessment  Diet Order:   Diet Order             Diet 2 gram sodium Room service appropriate? Yes; Fluid consistency: Thin  Diet effective now                   EDUCATION NEEDS:  Education needs have been addressed  Skin:  Skin Assessment: Reviewed RN Assessment  Last BM:  8/12  Height:  Ht Readings from Last 1 Encounters:  11/14/20 '6\' 3"'$  (1.905 m)    Weight:  Wt Readings from Last 1 Encounters:  11/14/20 79.8 kg    Ideal Body Weight:  89.1 kg  BMI:  Body mass index is 21.99 kg/m.  Estimated Nutritional Needs:  Kcal:  2400-2600 kcal/d Protein:  115-125g/d Fluid:  2L/d, if HD initiated, adjust to 1L+UOP   RRanell Patrick RD, LDN Clinical Dietitian Pager on Amion

## 2020-11-16 NOTE — Progress Notes (Signed)
[facility] Progress Note  Name: Micheal Bean MRN: [id number] DOB: 1986-02-01  Chief Complaint:  Lightheadedness/dizziness  Subjective:  he had 1.4 liters UOP over 8/13 charted.  I spoke with the patient, his fiance at bedside, and his mother and sister via speakerphone for 31 minutes per their speakerphone call timer.  We discussed work-up so far, findings, recommendations for dialysis, options about outpatient dialysis including home therapies, and longer-term option of transplant.  This has been a shock because he didn't realize he was sick.  Home dialysis is something that they would like to learn more about.  He doesn't have any malignancy and does agree to administration of aranesp.   We have discussed risks/benefits/indications for dialysis and he does want to proceed with dialysis.  He is comfortable with me consulting IR for a tunneled catheter for tomorrow   Review of systems:  Denies n/v Denies shortness of breath or chest pain  ----------- Background on consult:  Micheal Bean is a 35 y.o. male with a history of solitary kidney who presented to the hospital a few weeks of lightheadedness/dizziness and also chest tightness and sweating which had begun on the day of presentation.  Per charting he took a dose of amlodipine to see if it would help.  He also reported recent weight loss of 20 lbs per charting.  Recent diarrhea and has had nausea with decreased appetite.  He has been urinating at home but stream recently not as strong.  Urgent care directed him to the ER.  In the ER he was found to have a Cr of 12.59, BUN 111, and low serum bicarbonate at 10.  K ok at 4.1.  Imaging demonstrated s/p right nephrectomy and severe left sided hydronephrosis and hydroureter to the level of the bladder.  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  RN's were unable to place a foley and urology has been consulted.  Once urology placed foley note that over 1000 cc of  clear urine was obtained per charting.  Nephrology consulted for assistance with AKI.  He was on a bicarb gtt at 50 ml/hr - increased the dose to 100 ml/hr this am.  He was given PRBC's transfusion in the ER.  He denies any current chest tightness.  Note he has a history of nephrectomy as a child.  Note home meds include naproxen - normally uses a couple of times a week for pain.  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  Covid negative.  His fiance and his sister are at bedside.  His mother would help with medical decisions if needed, as well.  With regard to the labs below he wasn't aware of any renal dysfunction before and states that he does not have a PCP.    He would want dialysis if needed - this has come as a shock as he didn't know he had trouble with his kidneys.  Intake/Output Summary (Last 24 hours) at 11/16/2020 1123 Last data filed at 11/16/2020 0500 Gross per 24 hour  Intake 250 ml  Output 1425 ml  Net -1175 ml    Vitals:  Vitals:   11/15/20 1708 11/15/20 2118 11/16/20 0505 11/16/20 0926  BP: 130/89 (!) 127/92 126/81 134/90  Pulse: 99 83 83 84  Resp: '18 18 19 17  '$ Temp: 98.5 F (36.9 C) 98.3 F (36.8 C) 98.5 F (36.9 C) 98 F (36.7 C)  TempSrc: Oral Oral Oral   SpO2:  99% 100% 100% 100%  Weight:      Height:         Physical Exam:  General: adult male in bed in NAD HEENT: NCAT  Eyes: EOMI sclera anicteric Neck: supple trachea midline Heart: S1S2 no rub Lungs: clear to auscultation bilaterally normal work of breathing on room air  Abdomen: soft/nt/nd Extremities: no edema appreciated Neuro: alert and oriented x 3 provides hx and follows commands Psych normal mood and affect  GU foley in place   Medications reviewed   Labs:  BMP Latest Ref Rng & Units 11/16/2020 11/15/2020 11/15/2020  Glucose 70 - 99 mg/dL 86 88 91  BUN 6 - 20 mg/dL 117(H) 119(H) 118(H)  Creatinine 0.61 - 1.24 mg/dL 11.90(H) 11.37(H) 11.81(H)   Sodium 135 - 145 mmol/L 137 135 136  Potassium 3.5 - 5.1 mmol/L 3.7 3.4(L) 2.9(L)  Chloride 98 - 111 mmol/L 102 99 101  CO2 22 - 32 mmol/L 16(L) 17(L) 16(L)  Calcium 8.9 - 10.3 mg/dL 9.2 8.1(L) 8.0(L)     Assessment/Plan:   # ESRD - new diagnosis  - Secondary to obstruction and in the setting of solitary kidney.  With cortical atrophy and lack of improvement with foley catheter I feel that this represents CKD progression also represent CKD progression.  He would want dialysis if needed.  I am most concerned about CKD progression given the anemia - continue foley catheter - appreciate urology - consult IR for tunneled catheter for new ESRD - NPO after midnight for tunneled HD catheter - Start HD on 8/15 after tunneled catheter placement - Renal will need to contact HD social worker about an outpatient HD unit/start CLIP.  Would hope for TCU given his new ESRD and the plans for possible home dialysis.    - He is thinking about dialysis modality for long-term planning   - extended discussions with patient and his family today and yesterday    # Anemia iron deficiency and CKD - iron deficiency noted.  Platelets normal and elevated WBC.  Also in setting of AKI on CKD  - s/p PRBC with improvement  - IV iron x 2 ordered as well  - SPEP and free light chains pending.  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

## 2020-11-16 NOTE — Discharge Instructions (Addendum)
Phosphorus Content of Food  Phosphorus is an important mineral that your body uses for energy and overall health. What you eat and drink can affect the amount of phosphorus in your body. The key to selecting foods with phosphorus is having the right balance for your needs.  Natural and Added Phosphorus Natural Phosphorus: Phosphorus occurs naturally in meats, dairy, grains, and vegetables. Your body absorbs about half of this natural phosphorus from foods and drinks. Added Phosphorus: Phosphorus is also added to many foods and drinks as a preservative. Your body absorbs nearly all of the added phosphorus from foods and drinks.  How Much Phosphorus is in Food and Drinks? Nutrition Facts labels don't typically include phosphorus amounts and they don't identify whether the phosphorus in the product is natural or added. Read the ingredients list to check if a product label displays "phos" in the ingredients. This abbreviation will indicate for sure that phosphorus has been added. Ingredients with phosphorus that are most commonly added to food include disodium phosphate, sodium hexametaphosphate, phosphoric acid, calcium phosphate, and dipotassium phosphate. Foods and drinks with the highest added phosphorus are usually processed foods, packaged foods, and fast foods.  Phosphorus in Foods   Lower Phosphorus Choices   Higher Phosphorus Choices    Grains and Baked Goods Fresh breads, buns, dinner rolls, bagels English muffins, or pitas without "phos" ingredients Plain cereals such as oatmeal, corn flakes, rice crispies Reduced-salt crackers, rice cakes, pretzels, popcorn, or tortilla chips without "phos" ingredients Processed breads and cereals with phosphorus additives on food label Biscuits, brownies, cakes, muffins, pancakes, pastries, or waffles that are ready-to-eat or made from a dry mix with "phos" on the label Refrigerated or frozen dough for biscuits, cookies, pastries, or sweet  rolls with "phos" ingredients  Protein Foods All-natural chicken, [place], fish or seafood Lean and fresh beef, lamb, pork, veal, or wild game (3 ounces is size of palm of hand) Whole eggs or egg whites (1 egg is 1 ounce) Tofu, beans, lentils, hummus (1/4-1/3 cup) Unsalted nuts (1/4 cup) or nut butters (1 tablespoon) Processed meats like bacon, ham, hot dogs, chicken nuggets or strips, bologna, salami, or sausage Breaded or fried meats, chicken, fish or seafood Organ meats such as kidney or liver  Dairy and Dairy Alternatives Unfortified dairy alternates such as almond or rice beverages Milk or soy beverage (1/2 cup) Cottage cheese with no "phos" ingredients (1/2 cup) Yogurt (6 ounces) all natural, unsweetened, or plain preferred Natural cheese such as brie, feta, Swiss, cheddar, or mozzarella.  Only have a small amount (1 ounce - size of your thumb or 2 dice) Regular or low-fat cream cheese, [place], or sour cream (1 tbsp) Sherbet, sorbet, fruit ice, or popsicles (1/2 cup) Non-dairy creamers and some half and half creamers with "phos"  Enriched dairy alternates such as almond, oat or rice beverages Processed cheese, such as American Processed cheese spreads and dips Fat free cream cheese or sour cream Ice cream, pudding or frozen yogurt Milk-based or cheese-based soups or sauces  Vegetables Fresh, frozen, or canned without added "phos" ingredients Vegetables with sauces added Frozen or packaged potatoes or vegetables with "phos" ingredients  Fruits Fresh, frozen or canned without added "phos" ingredients  Fresh, frozen or canned with added "phos" ingredients  Beverages Water Fresh brewed coffee or tea Fresh lemonade or pure fruit juice (1/2 cup) Beverages without "phos" ingredients Beverage or powdered mix with "phos" ingredients: most colas, energy and sports drinks, canned or bottled coffees and teas,  flavored waters and drink mixes. Beers and wines  Other (Fast, convenience or  restaurant foods) Hamburgers, fish filet (no cheese), plain chicken wings Grilled, roasted, broiled, baked fish, chicken, [place], fish or seafood. Plain prepared eggs (no cheese, ham, bacon, sausage), [place] toast, English muffin, bagel, hot cereal, toast Pizza without meat or extra cheese Tacos, burritos, enchiladas fajitas with limited toppings. Choose white rice, lettuce, sauted onions, bell peppers on the side Tuna or egg salad sandwich (no cheese) Sides: salad without cheese, coleslaw, apple slices, applesauce, grapes, or carrots Meals with no "phos" ingredients and have less than 600 milligrams of sodium per serving Battered or fried fish or chicken including nuggets, sandwiches, strips or wings Pizza with meat and extra cheese Tacos, burritos, enchiladas with meat and toppings such as cheese and beans Hot dogs and sausages Any sandwich made with ham, processed deli meats, American cheese, or bacon [place] fries or other fried potatoes or battered vegetables, biscuits, or macaroni and cheese Meals or soups with "phos" ingredients and more than 600 millligrams of sodium per serving

## 2020-11-16 NOTE — Progress Notes (Addendum)
PROGRESS NOTE    [name]  [id number] DOB: 03-03-1986 DOA: 11/13/2020 PCP: Patient, No Pcp Per (Inactive)    Chief Complaint  Patient presents with   Dizziness    Brief Narrative:  Micheal Bean is Micheal Bean 35 y.o. male with medical history significant of history of right nephrectomy as Micheal Bean infant presenting with constellation of intermittent symptoms including headache, lightheadedness and chest tightness, As above has had 3-4 days of head ache, lightheadedness, chest tightness, nausea, diarrhea.  He tried taking some fluids at home did seem to help he also tried taking Jaelynne Hockley dose of his mother's amlodipine but did not seem to help.  He does feel better laying down but it feels worse standing up.  He does note losing 20 pounds in the last 2 months.  He was seen in urgent care and then sent to the ED for further evaluation.  He's been admitted for aki on ckd, left sided hydro with possible UTI, severe anemia, and leukocytosis.  Assessment & Plan:   Principal Problem:   Acute renal failure (ARF) (HCC) Active Problems:   Acute lower UTI   Leukocytosis   Bone lesion   AKI (acute kidney injury) ([place])   Osteomyelitis ([place])  Requesting transfer to [facility], family request - discussed no medical indication, but will call.  Discussed with [facility] internist on call, as no current medical indication, they can't honor family request at this time (with their current bed situation), but if medical indication arises, we can call back.  Will discuss with family 8/15.  Reviewed with family today that follow up with [facility] if desired after this admission is also Micheal Bean possibility.   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 - urology c/s,  appreciate recommendations - foley - may need cystoscopy/retrograde study if creatinine not improving/hydro not improving > Nephrology consulted, appreciate recs - planning tunneled catheter and start HD 8/15 - SPEP, UPEP, free light chains - pending - Appreciate nephrology recommendations - Continue with catheter placement - Trend renal function and electrolytes - worsened today   Iron Deficiency Anemia  Anemia of Chronic Disease > Noted to have significant anemia with [name] hemoglobin of 6. > iron deficiency, elevated ferritin - b12 and folate low normal - follow MMA, supplement b12/folate for now - will need iron supplementation - LDH wnl, haptoglobin elevated - not suggestive of hemolysis - suspect related to iron def anemia and his kidney disease - no hx dark or blood stool - Check FOBT  - s/p 3 unit pRBC - smear review notable for mild left shift -- pathologist smear review pending  Leukocytosis  Edema and Soft Tissue Thickening around R [place] Joint  Urinary Tract Infection Concern for UTI, but edema and soft tissue thickening around R [place] joint with heterogenous lucency involving head of R clavicle (similar changes involving head of L clavicle and focal indeterminate lucent lesion in R first rib) --- concerning for infection/osteo Currently on ceftriaxone to cover for UTI (UA with squams, but also nitrites) Urine culture pending Follow blood cultures (both sets unfortunately collected after abx, follow- Ng to date) ID recommending dapto/ceftriaxone CT surgery c/s to consider aspiration of R [place] joint, they did not feel any evidence of  drainable fluid collection and recommended medical management  Edema and soft tissue thickening around R sternoclavicular joint with heterogenous lucency involving the head of the R clavicle with similar changes involving head of L clavicle  Indeterminate lucent lesion in R first rib posteriorly Concerning for infection as noted above, malignancy also on  differential MRI currently pending May need to discuss with heme/onc  Elevated D Dimer Suspect related to inflammation  Negative LE [place] for DVT Consider VQ scan, but no evidence of hypoxia, no CP - suspect dimer related to other systemic issues, but low threshold for further w/u    DVT prophylaxis: SCD Code Status: full  Family Communication: none at bedside Disposition:   Status is: Observation  The patient will require care spanning > 2 midnights and should be moved to inpatient because: Inpatient level of care appropriate due to severity of illness  Dispo: The patient is from: Home              Anticipated d/c is to: Home              Patient currently is not medically stable to d/c.   Difficult to place patient No       Consultants:  Nephrology CT surgery Urology ID  Procedures:  LE [place] Summary:  BILATERAL:  - No evidence of deep vein thrombosis seen in the lower extremities,  bilaterally.  - No evidence of superficial venous thrombosis in the lower extremities,  bilaterally.  -No evidence of popliteal cyst, bilaterally.   Antimicrobials: Anti-infectives (From admission, onward)    Start     Dose/Rate Route Frequency Ordered Stop   11/17/20 0000  ceFAZolin (ANCEF) IVPB 2g/100 mL premix        2 g 200 mL/hr over 30 Minutes Intravenous To Radiology 11/16/20 1400 11/18/20 0000   11/14/20 2100  DAPTOmycin (CUBICIN) 700 mg in sodium chloride 0.9 % IVPB        8 mg/kg  85.2 kg (Adjusted) 128 mL/hr over 30 Minutes Intravenous Every 48 hours 11/14/20 2006     11/14/20 2045  cefTRIAXone (ROCEPHIN) 2 g in sodium chloride 0.9 % 100 mL IVPB        2 g 200 mL/hr over 30 Minutes Intravenous Every 24 hours 11/14/20 1948     11/13/20 2230  cefTRIAXone (ROCEPHIN) 1 g in sodium chloride 0.9 % 100 mL IVPB  Status:  Discontinued        1 g 200 mL/hr over 30 Minutes Intravenous Every 24 hours 11/13/20 2223 11/14/20 1948          Subjective: Patient and sig other at  bedside, no new complaints Asking about [facility] transfer  Objective: Vitals:   11/15/20 1708 11/15/20 2118 11/16/20 0505 11/16/20 0926  BP: 130/89 (!) 127/92 126/81 134/90  Pulse: 99 83 83 84  Resp: '18 18 19 17  '$ Temp: 98.5 F (36.9 C) 98.3 F (36.8 C) 98.5 F (36.9 C) 98 F (36.7 C)  TempSrc: Oral Oral Oral   SpO2: 99% 100% 100% 100%  Weight:      Height:        Intake/Output Summary (Last 24 hours) at 11/16/2020 1721 Last data filed at 11/16/2020 0500 Gross per 24 hour  Intake 100 ml  Output 1425 ml  Net -1325 ml   Filed Weights   11/14/20 0904 11/14/20 2154  Weight: 86.2 kg 79.8 kg    Examination:  General: No acute distress. Cardiovascular: RRR Lungs: unlabored Abdomen: Soft, nontender, nondistended Neurological:  Alert and oriented 3. Moves all extremities 4 . Cranial nerves II through XII grossly intact. Skin: Warm and dry. No rashes or lesions. Extremities: No clubbing or cyanosis. No edema.     Data Reviewed: I have personally reviewed following labs and imaging studies  CBC: Recent Labs  Lab 11/13/20 1951 11/14/20 0000 11/14/20 0542 11/15/20 0636 11/16/20 0639  WBC 29.9*  --  29.8* 23.7* 19.8*  NEUTROABS 28.3*  --   --  20.3* 16.5*  HGB 6.0* 6.8* 6.6* 8.6* 8.8*  HCT 19.9* 20.0* 20.9* 25.1* 26.2*  MCV 93.9  --  91.7 86.3 85.9  PLT 324  --  276 257 [id number]    Basic Metabolic Panel: Recent Labs  Lab 11/13/20 2143 11/14/20 0000 11/14/20 0500 11/14/20 2326 11/15/20 0636 11/15/20 1520 11/16/20 0639  NA  --    < > 134* 134* 136 135 137  K  --    < > 4.0 2.9* 2.9* 3.4* 3.7  CL  --   --  103 99 101 99 102  CO2  --   --  11* 15* 16* 17* 16*  GLUCOSE  --   --  101* 96 91 88 86  BUN  --   --  117* 119* 118* 119* 117*  CREATININE  --   --  12.46* 11.88* 11.81* 11.37* 11.90*  CALCIUM  --   --  7.8* 7.7* 8.0* 8.1* 9.2  MG 1.4*  --   --   --  1.5*  --  1.9  PHOS 8.8*  --  9.7*  --  11.8*  --  11.3*   < > = values in this interval not displayed.     GFR: Estimated Creatinine Clearance: 10.1 mL/min (Maryem Shuffler) (by C-G formula based on SCr of 11.9 mg/dL (H)).  Liver Function Tests: Recent Labs  Lab 11/13/20 1951 11/14/20 0500 11/15/20 0636 11/16/20 0639  AST 13*  --  8* 8*  ALT 9  --  9 9  ALKPHOS 112  --  100 107  BILITOT 0.2*  --  0.7 0.9  PROT 7.2  --  6.5 6.8  ALBUMIN 2.7* 2.6* 2.4* 2.4*    CBG: No results for input(s): GLUCAP in the last 168 hours.   Recent Results (from the past 240 hour(s))  Resp Panel by RT-PCR (Flu Aashvi Rezabek&B, Covid) Nasopharyngeal Swab     Status: None   Collection Time: 11/13/20  7:30 PM   Specimen: Nasopharyngeal Swab; Nasopharyngeal(NP) swabs in vial transport medium  Result Value Ref Range Status   SARS Coronavirus 2 by RT PCR NEGATIVE NEGATIVE Final    Comment: (NOTE) SARS-CoV-2 target nucleic acids are NOT DETECTED.  The SARS-CoV-2 RNA is generally detectable in upper respiratory specimens during the acute phase of infection. The lowest concentration of SARS-CoV-2 viral copies this assay can detect is 138 copies/mL. Zoeann Mol negative result does not preclude SARS-Cov-2 infection and should not be used as the sole basis for treatment or other patient management decisions. Carmina Walle negative result may occur with  improper specimen collection/handling, submission of specimen other than nasopharyngeal swab, presence of viral mutation(s) within the areas targeted by this assay, and inadequate number of viral copies(<138 copies/mL). Saben Donigan negative result must be combined with clinical observations, patient history, and epidemiological information. The expected result is Negative.  Fact Sheet for Patients:  EntrepreneurPulse.com.au  Fact Sheet for Healthcare Providers:  IncredibleEmployment.be  This test is no t yet approved or cleared by the [place] FDA and  has been  authorized for detection and/or diagnosis of SARS-CoV-2 by FDA under an Emergency Use Authorization (EUA).  This EUA will remain  in effect (meaning this test can be used) for the duration of the COVID-19 declaration under Section 564(b)(1) of the Act, 21 U.S.C.section 360bbb-3(b)(1), unless the authorization is terminated  or revoked sooner.       Influenza Odeal Welden by PCR NEGATIVE NEGATIVE Final   Influenza B by PCR NEGATIVE NEGATIVE Final    Comment: (NOTE) The Xpert Xpress SARS-CoV-2/FLU/RSV plus assay is intended as an aid in the diagnosis of influenza from Nasopharyngeal swab specimens and should not be used as Neely Cecena sole basis for treatment. Nasal washings and aspirates are unacceptable for Xpert Xpress SARS-CoV-2/FLU/RSV testing.  Fact Sheet for Patients: EntrepreneurPulse.com.au  Fact Sheet for Healthcare Providers: IncredibleEmployment.be  This test is not yet approved or cleared by the [place] FDA and has been authorized for detection and/or diagnosis of SARS-CoV-2 by FDA under an Emergency Use Authorization (EUA). This EUA will remain in effect (meaning this test can be used) for the duration of the COVID-19 declaration under Section 564(b)(1) of the Act, 21 U.S.C. section 360bbb-3(b)(1), unless the authorization is terminated or revoked.  Performed at [facility], [place] [address], [place], [place] [address]   Urine Culture     Status: None   Collection Time: 11/13/20 10:23 PM   Specimen: Urine, Clean Catch  Result Value Ref Range Status   Specimen Description URINE, CLEAN CATCH  Final   Special Requests NONE  Final   Culture   Final    NO GROWTH Performed at [facility], [place] [address], [place], [place] [address]    Report Status 11/15/2020 FINAL  Final  Culture, blood (routine x 2)     Status: None (Preliminary result)   Collection Time: 11/14/20  9:19 AM   Specimen: BLOOD RIGHT FOREARM  Result Value Ref Range Status   Specimen Description BLOOD RIGHT FOREARM  Final   Special Requests   Final    BOTTLES DRAWN  AEROBIC AND ANAEROBIC Blood Culture results may not be optimal due to an excessive volume of blood received in culture bottles   Culture   Final    NO GROWTH 2 DAYS Performed at [facility], [place] [address], [place], [place] [address]    Report Status PENDING  Incomplete  Culture, blood (Routine X 2) w Reflex to ID Panel     Status: None (Preliminary result)   Collection Time: 11/14/20 11:20 PM   Specimen: BLOOD LEFT FOREARM  Result Value Ref Range Status   Specimen Description BLOOD LEFT FOREARM  Final   Special Requests   Final    BOTTLES DRAWN AEROBIC ONLY Blood Culture adequate volume   Culture   Final    NO GROWTH 1 DAY Performed at [facility], [place] [address], [place],  [address]    Report Status PENDING  Incomplete         Radiology Studies: No results found.      Scheduled Meds:  Chlorhexidine Gluconate Cloth  6 each Topical Q0600   darbepoetin (ARANESP) injection - NON-DIALYSIS  40 mcg Subcutaneous Q Sun-1800   feeding supplement (NEPRO CARB STEADY)  237 mL Oral Q24H   multivitamin  1 tablet Oral QHS   sevelamer carbonate  800 mg Oral TID WC   sodium bicarbonate  650 mg Oral TID   sodium chloride flush  3 mL Intravenous Q12H   vitamin  B-12  1,000 mcg Oral Daily   Continuous Infusions:  sodium chloride     [START ON 11/17/2020]  ceFAZolin (ANCEF) IV     cefTRIAXone (ROCEPHIN)  IV 2 g (11/15/20 2121)   DAPTOmycin (CUBICIN)  IV 700 mg (11/15/20 0015)   ferric gluconate (FERRLECIT) IVPB 250 mg (11/16/20 1411)     LOS: 2 days    Time spent: over 30 min    Fayrene Helper, MD [facility]   To contact the attending provider between 7A-7P or the covering provider during after hours 7P-7A, please log into the web site [email or web address] and access using universal [place] password for that web site. If you do not have the password, please call the hospital operator.  11/16/2020, 5:21 PM

## 2020-11-17 ENCOUNTER — Encounter (HOSPITAL_COMMUNITY): Payer: Self-pay | Admitting: Family Medicine

## 2020-11-17 DIAGNOSIS — M899 Disorder of bone, unspecified: Secondary | ICD-10-CM

## 2020-11-17 DIAGNOSIS — D72829 Elevated white blood cell count, unspecified: Secondary | ICD-10-CM

## 2020-11-17 LAB — CBC WITH DIFFERENTIAL/PLATELET
Abs Immature Granulocytes: 0.14 10*3/uL — ABNORMAL HIGH (ref 0.00–0.07)
Basophils Absolute: 0.1 10*3/uL (ref 0.0–0.1)
Basophils Relative: 0 %
Eosinophils Absolute: 0.2 10*3/uL (ref 0.0–0.5)
Eosinophils Relative: 1 %
HCT: 26.1 % — ABNORMAL LOW (ref 39.0–52.0)
Hemoglobin: 8.7 g/dL — ABNORMAL LOW (ref 13.0–17.0)
Immature Granulocytes: 1 %
Lymphocytes Relative: 8 %
Lymphs Abs: 1.2 10*3/uL (ref 0.7–4.0)
MCH: 28.7 pg (ref 26.0–34.0)
MCHC: 33.3 g/dL (ref 30.0–36.0)
MCV: 86.1 fL (ref 80.0–100.0)
Monocytes Absolute: 1.1 10*3/uL — ABNORMAL HIGH (ref 0.1–1.0)
Monocytes Relative: 7 %
Neutro Abs: 13.7 10*3/uL — ABNORMAL HIGH (ref 1.7–7.7)
Neutrophils Relative %: 83 %
Platelets: 275 10*3/uL (ref 150–400)
RBC: 3.03 MIL/uL — ABNORMAL LOW (ref 4.22–5.81)
RDW: 14.3 % (ref 11.5–15.5)
WBC: 16.3 10*3/uL — ABNORMAL HIGH (ref 4.0–10.5)
nRBC: 0 % (ref 0.0–0.2)

## 2020-11-17 LAB — RENAL FUNCTION PANEL
Albumin: 2.4 g/dL — ABNORMAL LOW (ref 3.5–5.0)
Anion gap: 17 — ABNORMAL HIGH (ref 5–15)
BUN: 113 mg/dL — ABNORMAL HIGH (ref 6–20)
CO2: 18 mmol/L — ABNORMAL LOW (ref 22–32)
Calcium: 9 mg/dL (ref 8.9–10.3)
Chloride: 101 mmol/L (ref 98–111)
Creatinine, Ser: 11.52 mg/dL — ABNORMAL HIGH (ref 0.61–1.24)
GFR, Estimated: 5 mL/min — ABNORMAL LOW (ref >60)
Glucose, Bld: 83 mg/dL (ref 70–99)
Phosphorus: 10.2 mg/dL — ABNORMAL HIGH (ref 2.5–4.6)
Potassium: 3.6 mmol/L (ref 3.5–5.1)
Sodium: 136 mmol/L (ref 135–145)

## 2020-11-17 LAB — KAPPA/LAMBDA LIGHT CHAINS
Kappa free light chain: 178.7 mg/L — ABNORMAL HIGH (ref 3.3–19.4)
Kappa, lambda light chain ratio: 2.02 — ABNORMAL HIGH (ref 0.26–1.65)
Lambda free light chains: 88.5 mg/L — ABNORMAL HIGH (ref 5.7–26.3)

## 2020-11-17 LAB — HEPATIC FUNCTION PANEL
ALT: 8 U/L (ref 0–44)
AST: 10 U/L — ABNORMAL LOW (ref 15–41)
Albumin: 2.3 g/dL — ABNORMAL LOW (ref 3.5–5.0)
Alkaline Phosphatase: 106 U/L (ref 38–126)
Bilirubin, Direct: <0.1 mg/dL (ref 0.0–0.2)
Total Bilirubin: 0.6 mg/dL (ref 0.3–1.2)
Total Protein: 6.8 g/dL (ref 6.5–8.1)

## 2020-11-17 LAB — METHYLMALONIC ACID, SERUM: Methylmalonic Acid, Quantitative: 546 nmol/L — ABNORMAL HIGH (ref 0–378)

## 2020-11-17 LAB — SEDIMENTATION RATE: Sed Rate: >140 mm/hr — ABNORMAL HIGH (ref 0–16)

## 2020-11-17 LAB — C-REACTIVE PROTEIN: CRP: 16.4 mg/dL — ABNORMAL HIGH (ref <1.0)

## 2020-11-17 MED ORDER — LANTHANUM CARBONATE 500 MG PO CHEW
1000.0000 mg | CHEWABLE_TABLET | Freq: Three times a day (TID) | ORAL | Status: DC
  Administered 2020-11-17 – 2020-11-24 (×14): 1000 mg via ORAL
  Filled 2020-11-17 (×14): qty 2

## 2020-11-17 MED ORDER — CEFAZOLIN SODIUM-DEXTROSE 2-4 GM/100ML-% IV SOLN: 2.0000 g | INTRAVENOUS | Status: DC

## 2020-11-17 MED ORDER — ONDANSETRON 4 MG PO TBDP
4.0000 mg | ORAL_TABLET | Freq: Three times a day (TID) | ORAL | Status: DC | PRN
  Administered 2020-11-17: 4 mg via ORAL
  Filled 2020-11-17: qty 1

## 2020-11-17 MED ORDER — LORAZEPAM 2 MG/ML IJ SOLN: 1.0000 mg | Freq: Four times a day (QID) | INTRAMUSCULAR | Status: DC | PRN

## 2020-11-17 MED ORDER — ONDANSETRON 4 MG PO TBDP
4.0000 mg | ORAL_TABLET | Freq: Four times a day (QID) | ORAL | Status: DC | PRN
  Administered 2020-11-18 – 2020-11-22 (×3): 4 mg via ORAL
  Filled 2020-11-17 (×4): qty 1

## 2020-11-17 NOTE — Progress Notes (Signed)
Patient ID: Micheal Bean, male   DOB: 04-25-1985, 35 y.o.   MRN: [id number] [facility] Progress Note   Assessment/ Plan:   1.  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  I am partially encouraged by slight improvement of BUN/creatinine overnight and if this trends down further tomorrow, may be able to monitor without any dialysis for now.  TDC placement deferred to tomorrow because of persistent leukocytosis that continues to trend down.  Negative cultures and no fever. 2.  Anemia: Secondary to chronic kidney disease/iron deficiency.  Status post intravenous iron and started on ESA. 3.  Metabolic acidosis: Status post initial management with sodium bicarbonate drip and transition to oral sodium bicarbonate.  Will discontinue bicarbonate supplementation after started on hemodialysis. 4.  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 5.  Chronic kidney disease/metabolic bone disease: On low-dose sevelamer for hyperphosphatemia-I will discontinue this and start him on lanthanum.  Subjective:   Denies any chest pain or shortness of breath and reports that he feels well.   Objective:   BP 122/76   Pulse 90   Temp 98.1 F (36.7 C) (Oral)   Resp 18   Ht '6\' 3"'$  (1.905 m)   Wt 79.8 kg   SpO2 98%   BMI 21.99 kg/m   Intake/Output Summary (Last 24 hours) at 11/17/2020 0957 Last data filed at 11/17/2020 0815 Gross per 24 hour  Intake 884.83 ml  Output 3075 ml  Net -2190.17 ml   Weight change:   Physical Exam: Gen: Appears comfortable resting in bed, visibly anxious.  Mother and 2 sisters at bedside. CVS: Pulse regular rhythm, normal rate, S1 and S2  normal Resp: Clear to auscultation bilaterally, no rales/rhonchi Abd: Soft, flat, nontender, bowel sounds normal Ext: No lower extremity edema  Imaging: No results found.  Labs: BMET Recent Labs  Lab 11/13/20 1951 11/13/20 2143 11/14/20 0000 11/14/20 0500 11/14/20 2326 11/15/20 0636 11/15/20 1520 11/16/20 0639 11/17/20 0341  NA 131*  --  134* 134* 134* 136 135 137 136  K 4.1  --  4.5 4.0 2.9* 2.9* 3.4* 3.7 3.6  CL 106  --   --  103 99 101 99 102 101  CO2 10*  --   --  11* 15* 16* 17* 16* 18*  GLUCOSE 118*  --   --  101* 96 91 88 86 83  BUN 111*  --   --  117* 119* 118* 119* 117* 113*  CREATININE 12.59*  --   --  12.46* 11.88* 11.81* 11.37* 11.90* 11.52*  CALCIUM 8.0*  --   --  7.8* 7.7* 8.0* 8.1* 9.2 9.0  PHOS  --  8.8*  --  9.7*  --  11.8*  --  11.3* 10.2*   CBC Recent Labs  Lab 11/13/20 1951 11/14/20 0000 11/14/20 0542 11/15/20 0636 11/16/20 0639 11/17/20 0341  WBC 29.9*  --  29.8* 23.7* 19.8* 16.3*  NEUTROABS 28.3*  --   --  20.3* 16.5* 13.7*  HGB 6.0*   < > 6.6* 8.6* 8.8* 8.7*  HCT 19.9*   < > 20.9* 25.1* 26.2* 26.1*  MCV 93.9  --  91.7 86.3 85.9 86.1  PLT 324  --  276 257 269 275   < > = values  in this interval not displayed.    Medications:     Chlorhexidine Gluconate Cloth  6 each Topical Q0600   Chlorhexidine Gluconate Cloth  6 each Topical Q0600   darbepoetin (ARANESP) injection - NON-DIALYSIS  40 mcg Subcutaneous Q Sun-1800   feeding supplement (NEPRO CARB STEADY)  237 mL Oral Q24H   multivitamin  1 tablet Oral QHS   sevelamer carbonate  800 mg Oral TID WC   sodium bicarbonate  650 mg Oral TID   sodium chloride flush  3 mL Intravenous Q12H   vitamin B-12  1,000 mcg Oral Daily   Elmarie Shiley, MD 11/17/2020, 9:57 AM

## 2020-11-17 NOTE — Progress Notes (Signed)
Pharmacy Antibiotic Note  Micheal Bean is a 35 y.o. male admitted on 11/13/2020 with acute renal failure, leukocytosis, UTI. Also, noted to have bony abnormality of clavicle. > T scan showed changes consistent with possible osteomyelitis versus bone tumor. Pharmacy has been consulted for Daptomycin dosing. Patient is also on ceftriaxone.  WBC down on antibiotics. Starting dialysis 8/15. CK wnl.   Plan: Daptomycin 8 mg/kg IV q48 hours = 700 mg IV q48 hours.  Ceftriaxone per team  Monitor clinical status, renal function and QMon CK   F/u ID recs   Height: '6\' 3"'$  (190.5 cm) Weight: 79.8 kg (175 lb 14.8 oz) IBW/kg (Calculated) : 84.5  Temp (24hrs), Avg:98.2 F (36.8 C), Min:98 F (36.7 C), Max:98.6 F (37 C)  Recent Labs  Lab 11/13/20 1951 11/14/20 0500 11/14/20 0542 11/14/20 2326 11/15/20 0636 11/15/20 1520 11/16/20 0639 11/17/20 0341  WBC 29.9*  --  29.8*  --  23.7*  --  19.8* 16.3*  CREATININE 12.59*   < >  --  11.88* 11.81* 11.37* 11.90* 11.52*   < > = values in this interval not displayed.     Estimated Creatinine Clearance: 10.4 mL/min (A) (by C-G formula based on SCr of 11.52 mg/dL (H)).    No Known Allergies  Antimicrobials this admission: Ceftriaxone 8/11 >> Daptomycin 8/12>>    8/13 CK: 46    Microbiology results: 8/12 BCx:  ngtd 8/12 Ucx (after CTX):  ngtd 8/11 Covid: neg: flu: neg 8/11 UA: small Hfb, large leuk, nitrite +, WBC >50    Benetta Spar, PharmD, [place], BCCP Clinical Pharmacist  Please check AMION for all [place] phone numbers After 10:00 PM, call [place] [phone]

## 2020-11-17 NOTE — Progress Notes (Signed)
[facility] for Infectious Disease    Date of Admission:  11/13/2020   Total days of antibiotics 4          ID: Micheal Bean is a 35 y.o. male with  ESRD with left hydronephrosis found to have leukocytosis, elevated inflammatory markers with incidental finding of [place] joint abn and right clavicular head abn, but also thought to be bilateral. Had 3 episode of fever/nightsweat in 2 wks period. Had unintentional weight loss Principal Problem:   Acute renal failure (ARF) (HCC) Active Problems:   Acute lower UTI   Leukocytosis   Bone lesion   AKI (acute kidney injury) ([place])   Osteomyelitis (HCC)    Subjective: Afebrile. No tenderness to chest wall ROS: 12 point ros is negative  Medications:   Chlorhexidine Gluconate Cloth  6 each Topical Q0600   Chlorhexidine Gluconate Cloth  6 each Topical Q0600   darbepoetin (ARANESP) injection - NON-DIALYSIS  40 mcg Subcutaneous Q Sun-1800   feeding supplement (NEPRO CARB STEADY)  237 mL Oral Q24H   lanthanum  1,000 mg Oral TID WC   multivitamin  1 tablet Oral QHS   sodium bicarbonate  650 mg Oral TID   sodium chloride flush  3 mL Intravenous Q12H   vitamin B-12  1,000 mcg Oral Daily    Objective: Vital signs in last 24 hours: Temp:  [97.8 F (36.6 C)-98.6 F (37 C)] 97.8 F (36.6 C) (08/15 1631) Pulse Rate:  [82-90] 85 (08/15 1631) Resp:  [16-18] 16 (08/15 1631) BP: (122-132)/(76-88) 132/84 (08/15 1631) SpO2:  [98 %-100 %] 99 % (08/15 1631)  Physical Exam  Constitutional: He is oriented to person, place, and time. He appears well-developed and well-nourished. No distress.  HENT:  Mouth/Throat: Oropharynx is clear and moist. No oropharyngeal exudate.  Cardiovascular: Normal rate, regular rhythm and normal heart sounds. Exam reveals no gallop and no friction rub.  No murmur heard.  Pulmonary/Chest: Effort normal and breath sounds normal. No respiratory distress. He has no wheezes.  Abdominal: Soft. Bowel sounds are normal. He  exhibits no distension. There is no tenderness.  Lymphadenopathy:  He has no cervical adenopathy.  Neurological: He is alert and oriented to person, place, and time.  Skin: Skin is warm and dry. No rash noted. No erythema.  Psychiatric: He has a normal mood and affect. His behavior is normal.    Lab Results Recent Labs    11/16/20 0639 11/17/20 0341  WBC 19.8* 16.3*  HGB 8.8* 8.7*  HCT 26.2* 26.1*  NA 137 136  K 3.7 3.6  CL 102 101  CO2 16* 18*  BUN 117* 113*  CREATININE 11.90* 11.52*   Liver Panel Recent Labs    11/16/20 0639 11/17/20 0341  PROT 6.8 6.8  ALBUMIN 2.4* 2.3*  2.4*  AST 8* 10*  ALT 9 8  ALKPHOS 107 106  BILITOT 0.9 0.6  BILIDIR  --  <0.1  IBILI  --  NOT CALCULATED   Sedimentation Rate Recent Labs    11/17/20 0341  ESRSEDRATE >140*   C-Reactive Protein Recent Labs    11/14/20 2326 11/17/20 0341  CRP 19.7* 16.4*    Microbiology: reviewed Studies/Results: No results found.   Assessment/Plan: Abnormality noted on CT imaging of Sternoclavicular region bilaterally = recommend to stop iv abtx. Await for MRI imaging of sternum to see if can get better understanding of CT findings. Possibly will need bone biopsy if want to pursue determining if this infectious process. Patient is  clinically stable, will monitor off of abtx.  [facility] Cell: [phone] Pager: [phone]  11/17/2020, 6:13 PM

## 2020-11-17 NOTE — Consult Note (Signed)
Chief Complaint: Patient was seen in consultation today for tunneled hemodialysis catheter placement Chief Complaint  Patient presents with   Dizziness   at the request of Dr Katheren Puller   Supervising Physician: Jacqulynn Cadet  Patient Status: [facility] - In-pt  History of Present Illness: Micheal Bean is a 35 y.o. male   Admitted with acute renal failure 11/13/20 Leukocytosis; UTI Known Rt nephrectomy as infant Wt loss of recent Dizziness; Nausea; sweating  Cr 11.52 today Wbc 16 -- trending down Afebrile  Request made for tunneled dialysis catheter placement   Spoke to Dr Posey Pronto-- ?temp today--- tunneled tomorrow? 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  Medications: Prior to Admission medications   Medication Sig Start Date End Date Taking? Authorizing Provider  naproxen sodium (ALEVE) 220 MG tablet Take 220 mg by mouth daily as needed (pain).   Yes [provider]     Family History  Problem Relation Age of Onset   Hypertension Mother     Social History   Socioeconomic History   Marital status: Single    Spouse name: Not on file   Number of children: Not on file   Years of education: Not on file   Highest education level: Not on file  Occupational History   Not on file  Tobacco Use   Smoking status: Never   Smokeless tobacco: Never  Substance and Sexual Activity   Alcohol use: Yes    Comment: occassional   Drug use: Yes    Types: Marijuana   Sexual activity: Not on file  Other Topics Concern   Not on file  Social History Narrative   Not on file   Social Determinants of Health   Financial Resource Strain: Not on file  Food Insecurity: Not on file  Transportation Needs: Not on file  Physical  Activity: Not on file  Stress: Not on file  Social Connections: Not on file     Review of Systems: A 12 point ROS discussed and pertinent positives are indicated in the HPI above.  All other systems are negative.  Review of Systems  Constitutional:  Positive for activity change, appetite change, fatigue and unexpected weight change. Negative for fever.  Respiratory:  Negative for cough and shortness of breath.   Cardiovascular:  Negative for chest pain.  Gastrointestinal:  Positive for nausea. Negative for abdominal pain.  Musculoskeletal:  Negative for back pain.  Neurological:  Positive for dizziness, weakness, light-headedness and headaches.  Psychiatric/Behavioral:  Negative for behavioral problems and confusion.    Vital Signs: BP 122/76   Pulse 90   Temp 98.1 F (36.7 C) (Oral)   Resp 18   Ht '6\' 3"'$  (1.905 m)   Wt 175 lb 14.8 oz (79.8 kg)   SpO2 98%   BMI 21.99 kg/m   Physical Exam Vitals reviewed.  HENT:     Mouth/Throat:     Mouth: Mucous membranes are moist.  Cardiovascular:     Rate and Rhythm: Normal rate and regular rhythm.     Heart sounds: Normal heart sounds.  Pulmonary:     Breath sounds: Normal breath sounds. No wheezing.  Abdominal:     Palpations: Abdomen is soft.  Musculoskeletal:        General: Normal range of motion.     Right lower leg: No edema.     Left lower leg: No edema.  Skin:    General: Skin is warm.  Neurological:     Mental Status: He is alert and oriented to person, place, and time.  Psychiatric:        Behavior: Behavior normal.    Imaging: CT ABDOMEN PELVIS WO CONTRAST  Result Date: 11/13/2020 CLINICAL DATA:  Diarrhea swelling over proximal right clavicle EXAM: CT CHEST, ABDOMEN AND PELVIS WITHOUT CONTRAST TECHNIQUE: Multidetector CT imaging of the chest, abdomen and pelvis was performed following the standard protocol without IV contrast. COMPARISON:  CT 06/25/2012 FINDINGS: CT CHEST FINDINGS Cardiovascular: Limited  evaluation without intravenous contrast. Aorta is nonaneurysmal. Normal cardiac size. No pericardial effusion. Mediastinum/Nodes: Midline trachea. No thyroid mass. No suspicious nodes. Esophagus normal Lungs/Pleura: Lungs are clear. No pleural effusion or pneumothorax. Musculoskeletal: Mild edema and soft tissue swelling/thickening centered at the right sternoclavicular joint. Irregular lucency within the head of the right clavicle. Similar but less extensive changes involving head of left clavicle. Focal heterogeneous lucency within the right first rib, series 4, image 34. Bones appear diffusely dense CT ABDOMEN PELVIS FINDINGS Hepatobiliary: No focal liver abnormality is seen. No gallstones, gallbladder wall thickening, or biliary dilatation. Pancreas: Unremarkable. No pancreatic ductal dilatation or surrounding inflammatory changes. Spleen: Normal in size without focal abnormality. Adrenals/Urinary Tract: Adrenal glands are normal. Status post right nephrectomy. Severe left-sided hydronephrosis and hydroureter with tortuous left ureter dilated down to the level of the bladder. Dilated right ureteral remnant with cystic enlargement of the distal right ureter. Bladder is unremarkable. Stomach/Bowel: Stomach nonenlarged. No dilated small bowel. Negative appendix. No acute bowel wall thickening Vascular/Lymphatic: Nonaneurysmal aorta.  No suspicious nodes Reproductive: Prostate is unremarkable. Other: Negative for pelvic effusion or free air Musculoskeletal: Bones appear diffusely increased in density suggesting metabolic bone disease, possibly due to chronic kidney disease. 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 Interval resorptive changes at bilateral SI joints with some sclerosis. No inflammatory changes by CT. IMPRESSION: 1. Edema and soft tissue thickening centered the right sternoclavicular joint with heterogeneous lucency involving the head of the right clavicle. Similar changes involving head of left  clavicle though less extensive. Findings could be secondary to osteomyelitis and infection with bone tumor an additional consideration. Further evaluation with MRI could be considered. Additional heterogeneous focal indeterminate lucent lesion in the right first rib posteriorly. 2. Status post right nephrectomy. Severe left-sided hydronephrosis and hydroureter down to the level of the bladder. Interval cortical atrophy of left kidney. There is right ureteral remnant which is also dilated with cystic change distally. Electronically Signed   By: Donavan Foil M.D.   On: 11/13/2020 23:11   CT Chest Wo Contrast  Result Date: 11/13/2020 CLINICAL DATA:  Diarrhea swelling over proximal right clavicle EXAM: CT CHEST, ABDOMEN AND PELVIS WITHOUT CONTRAST TECHNIQUE: Multidetector CT imaging of the chest, abdomen and pelvis was performed following the standard protocol without IV contrast. COMPARISON:  CT 06/25/2012 FINDINGS: CT CHEST FINDINGS Cardiovascular: Limited evaluation without intravenous contrast. Aorta is nonaneurysmal. Normal cardiac size. No pericardial effusion. Mediastinum/Nodes: Midline trachea. No thyroid mass. No suspicious nodes. Esophagus normal Lungs/Pleura: Lungs are clear. No pleural effusion or pneumothorax. Musculoskeletal: Mild edema and soft tissue swelling/thickening centered at the right sternoclavicular joint. Irregular lucency within the head of the right clavicle. Similar but less extensive changes involving head of left clavicle. Focal heterogeneous  lucency within the right first rib, series 4, image 34. Bones appear diffusely dense CT ABDOMEN PELVIS FINDINGS Hepatobiliary: No focal liver abnormality is seen. No gallstones, gallbladder wall thickening, or biliary dilatation. Pancreas: Unremarkable. No pancreatic ductal dilatation or surrounding inflammatory changes. Spleen: Normal in size without focal abnormality. Adrenals/Urinary Tract: Adrenal glands are normal. Status post right  nephrectomy. Severe left-sided hydronephrosis and hydroureter with tortuous left ureter dilated down to the level of the bladder. Dilated right ureteral remnant with cystic enlargement of the distal right ureter. Bladder is unremarkable. Stomach/Bowel: Stomach nonenlarged. No dilated small bowel. Negative appendix. No acute bowel wall thickening Vascular/Lymphatic: Nonaneurysmal aorta.  No suspicious nodes Reproductive: Prostate is unremarkable. Other: Negative for pelvic effusion or free air Musculoskeletal: Bones appear diffusely increased in density suggesting metabolic bone disease, possibly due to chronic kidney disease. 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 Interval resorptive changes at bilateral SI joints with some sclerosis. No inflammatory changes by CT. IMPRESSION: 1. Edema and soft tissue thickening centered the right sternoclavicular joint with heterogeneous lucency involving the head of the right clavicle. Similar changes involving head of left clavicle though less extensive. Findings could be secondary to osteomyelitis and infection with bone tumor an additional consideration. Further evaluation with MRI could be considered. Additional heterogeneous focal indeterminate lucent lesion in the right first rib posteriorly. 2. Status post right nephrectomy. Severe left-sided hydronephrosis and hydroureter down to the level of the bladder. Interval cortical atrophy of left kidney. There is right ureteral remnant which is also dilated with cystic change distally. Electronically Signed   By: Donavan Foil M.D.   On: 11/13/2020 23:11   DG Chest Portable 1 View  Result Date: 11/13/2020 CLINICAL DATA:  Evaluate proximal right clavicular head. EXAM: PORTABLE CHEST 1 VIEW COMPARISON:  None. FINDINGS: No gross abnormality of the proximal right clavicular head is evident within the limitations of a single portable frontal radiograph of the chest. Low lung volumes with streaky opacities favoring  atelectatic change. No consolidative process. No pneumothorax or layering effusion. The cardiomediastinal contours are unremarkable. No acute osseous or soft tissue abnormality. Telemetry leads overlie the chest. IMPRESSION: No gross abnormality of the proximal right clavicular head though evaluation is limited on this single view portable chest radiograph. Could consider dedicated clavicular radiographs or cross-sectional imaging if there is persisting clinical concern. Electronically Signed   By: Lovena Le M.D.   On: 11/13/2020 22:36   VAS [place] LOWER EXTREMITY VENOUS (DVT)  Result Date: 11/15/2020  Lower Venous DVT Study Patient Name:  Micheal Bean  Date of Exam:   11/14/2020 Medical Rec #: [id number]      Accession #:    [id number] Date of Birth: Jun 08, 1985      Patient Gender: M Patient Age:   29 years Exam Location:  [facility] Procedure:      VAS [place] LOWER EXTREMITY VENOUS (DVT) Referring Phys: Sheppard Coil MELVIN --------------------------------------------------------------------------------  Indications: Elevated D-dimer.  Comparison Study: No previous exams Performing Technologist: Darlin Coco  Examination Guidelines: A complete evaluation includes B-mode imaging, spectral Doppler, color Doppler, and power Doppler as needed of all accessible portions of each vessel. Bilateral testing is considered an integral part of a complete examination. Limited examinations for reoccurring indications may be performed as noted. The reflux portion of the exam is performed with the patient in reverse Trendelenburg.  +---------+---------------+---------+-----------+----------+--------------+ RIGHT    CompressibilityPhasicitySpontaneityPropertiesThrombus Aging +---------+---------------+---------+-----------+----------+--------------+ CFV      Full  Yes      Yes                                 +---------+---------------+---------+-----------+----------+--------------+ SFJ      Full                                                         +---------+---------------+---------+-----------+----------+--------------+ FV Prox  Full                                                        +---------+---------------+---------+-----------+----------+--------------+ FV Mid   Full                                                        +---------+---------------+---------+-----------+----------+--------------+ FV DistalFull                                                        +---------+---------------+---------+-----------+----------+--------------+ PFV      Full                                                        +---------+---------------+---------+-----------+----------+--------------+ POP      Full           Yes      Yes                                 +---------+---------------+---------+-----------+----------+--------------+ PTV      Full                                                        +---------+---------------+---------+-----------+----------+--------------+ PERO     Full                                                        +---------+---------------+---------+-----------+----------+--------------+ Gastroc  Full                                                        +---------+---------------+---------+-----------+----------+--------------+   +---------+---------------+---------+-----------+----------+--------------+ LEFT     CompressibilityPhasicitySpontaneityPropertiesThrombus Aging +---------+---------------+---------+-----------+----------+--------------+ CFV  Full           Yes      Yes                                 +---------+---------------+---------+-----------+----------+--------------+ SFJ      Full                                                        +---------+---------------+---------+-----------+----------+--------------+ FV Prox  Full                                                         +---------+---------------+---------+-----------+----------+--------------+ FV Mid   Full                                                        +---------+---------------+---------+-----------+----------+--------------+ FV DistalFull                                                        +---------+---------------+---------+-----------+----------+--------------+ PFV      Full                                                        +---------+---------------+---------+-----------+----------+--------------+ POP      Full           Yes      Yes                                 +---------+---------------+---------+-----------+----------+--------------+ PTV      Full                                                        +---------+---------------+---------+-----------+----------+--------------+ PERO     Full                                                        +---------+---------------+---------+-----------+----------+--------------+ Gastroc  Full                                                        +---------+---------------+---------+-----------+----------+--------------+  Summary: BILATERAL: - No evidence of deep vein thrombosis seen in the lower extremities, bilaterally. - No evidence of superficial venous thrombosis in the lower extremities, bilaterally. -No evidence of popliteal cyst, bilaterally.   *See table(s) above for measurements and observations. Electronically signed by Harold Barban MD on 11/15/2020 at 1:27:53 PM.    Final     Labs:  CBC: Recent Labs    11/14/20 0542 11/15/20 0636 11/16/20 0639 11/17/20 0341  WBC 29.8* 23.7* 19.8* 16.3*  HGB 6.6* 8.6* 8.8* 8.7*  HCT 20.9* 25.1* 26.2* 26.1*  PLT 276 257 269 275    COAGS: Recent Labs    11/13/20 2143  INR 1.5*    BMP: Recent Labs    11/15/20 0636 11/15/20 1520 11/16/20 0639 11/17/20 0341  NA 136 135 137 136  K 2.9* 3.4* 3.7 3.6  CL 101 99 102 101  CO2 16* 17* 16*  18*  GLUCOSE 91 88 86 83  BUN 118* 119* 117* 113*  CALCIUM 8.0* 8.1* 9.2 9.0  CREATININE 11.81* 11.37* 11.90* 11.52*  GFRNONAA 5* 6* 5* 5*    LIVER FUNCTION TESTS: Recent Labs    11/13/20 1951 11/14/20 0500 11/15/20 0636 11/16/20 0639 11/17/20 0341  BILITOT 0.2*  --  0.7 0.9 0.6  AST 13*  --  8* 8* 10*  ALT 9  --  '9 9 8  '$ ALKPHOS 112  --  100 107 106  PROT 7.2  --  6.5 6.8 6.8  ALBUMIN 2.7* 2.6* 2.4* 2.4* 2.3*  2.4*    TUMOR MARKERS: No results for input(s): AFPTM, CEA, CA199, CHROMGRNA in the last 8760 hours.  Assessment and Plan:  Acute renal failure Will need to initiate dialysis per Nephrology Wbc hi at 63 (although trending down)---  Afebrile Discussed with Dr Posey Pronto-- will hold til 8/16 Recheck wbc in am Risks and benefits discussed with the patient including, but not limited to bleeding, infection, vascular injury, pneumothorax which may require chest tube placement, air embolism or even death  All of the patient's questions were answered, patient is agreeable to proceed. Consent signed and in chart.     Thank you for this interesting consult.  I greatly enjoyed meeting Micheal Bean and look forward to participating in their care.  A copy of this report was sent to the requesting provider on this date.  Electronically Signed: Lavonia Drafts, PA-C 11/17/2020, 9:46 AM   I spent a total of 20 Minutes    in face to face in clinical consultation, greater than 50% of which was counseling/coordinating care for tunneled HD catheter placement

## 2020-11-17 NOTE — Progress Notes (Signed)
PROGRESS NOTE    Micheal Bean  [id number] DOB: 12/27/1985 DOA: 11/13/2020 PCP: Patient, No Pcp Per (Inactive)    Chief Complaint  Patient presents with   Dizziness    Brief Narrative:  Micheal Bean is Micheal Bean 35 y.o. male with medical history significant of history of right nephrectomy as Micheal Bean infant presenting with constellation of intermittent symptoms including headache, lightheadedness and chest tightness, As above has had 3-4 days of head ache, lightheadedness, chest tightness, nausea, diarrhea.  He tried taking some fluids at home did seem to help he also tried taking Micheal Bean dose of his mother's amlodipine but did not seem to help.  He does feel better laying down but it feels worse standing up.  He does note losing 20 pounds in the last 2 months.  He was seen in urgent care and then sent to the ED for further evaluation.  He's been admitted for aki on ckd, left sided hydro with possible UTI, severe anemia, and leukocytosis.  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 - urology c/s, appreciate recommendations - foley - may need cystoscopy/retrograde study if creatinine not improving/hydro not improving > Nephrology consulted, appreciate recs - planning tunneled catheter and start HD 8/15 - SPEP, UPEP, free light chains - elevated kappa/lamda light chain - may need to discuss with heme, follow SPEP - Appreciate nephrology recommendations [facility] tomorrow  - Continue with catheter placement - Trend renal function and  electrolytes - worsened today   Iron Deficiency Anemia  Anemia of Chronic Disease > Noted to have significant anemia with Micheal Bean hemoglobin of 6. > iron deficiency, elevated ferritin - b12 and folate low normal - follow MMA, supplement b12/folate for now - will need iron supplementation - LDH wnl, haptoglobin elevated - not suggestive of hemolysis - suspect related to iron def anemia and his kidney disease - no hx dark or blood stool - Check FOBT  - s/p 3 unit pRBC - smear review notable for mild left shift -- pathologist smear review pending  Leukocytosis  Edema and Soft Tissue Thickening around R [name]  Urinary Tract Infection Concern for UTI, but edema and soft tissue thickening around R [name] joint with heterogenous lucency involving head of R clavicle (similar changes involving head of L clavicle and focal indeterminate lucent lesion in R first rib) --- concerning for infection/osteo Currently on ceftriaxone to cover for UTI (UA with squams, but also nitrites) Urine culture no growth Follow blood cultures (both sets unfortunately collected after abx, follow- Ng to date) ID recommending holding abx at this point -> awaiting MRI, may need bone bx  CT surgery c/s to consider aspiration of R  joint, they did not feel any evidence of drainable fluid collection and recommended medical management  Edema and soft tissue thickening around R sternoclavicular joint with heterogenous lucency involving the head of the R clavicle with similar changes involving head of L clavicle  Indeterminate lucent lesion in R first rib posteriorly Concerning for infection  as noted above, malignancy also on differential MRI currently pending hopefully going to get this done today May need to discuss with heme/onc  Elevated D Dimer Suspect related to inflammation  Negative LE [place] for DVT Consider VQ scan, but no evidence of hypoxia, no CP - suspect dimer related to other systemic issues, but low threshold for  further w/u    DVT prophylaxis: SCD Code Status: full  Family Communication: none at bedside Disposition:   Status is: Observation  The patient will require care spanning > 2 midnights and should be moved to inpatient because: Inpatient level of care appropriate due to severity of illness  Dispo: The patient is from: Home              Anticipated d/c is to: Home              Patient currently is not medically stable to d/c.   Difficult to place patient No       Consultants:  Nephrology CT surgery Urology ID  Procedures:  LE [place] Summary:  BILATERAL:  - No evidence of deep vein thrombosis seen in the lower extremities,  bilaterally.  - No evidence of superficial venous thrombosis in the lower extremities,  bilaterally.  -No evidence of popliteal cyst, bilaterally.   Antimicrobials: Anti-infectives (From admission, onward)    Start     Dose/Rate Route Frequency Ordered Stop   11/18/20 0000  ceFAZolin (ANCEF) IVPB 2g/100 mL premix  Status:  Discontinued        2 g 200 mL/hr over 30 Minutes Intravenous To Radiology 11/17/20 1014 11/17/20 1014   11/17/20 0000  ceFAZolin (ANCEF) IVPB 2g/100 mL premix  Status:  Discontinued        2 g 200 mL/hr over 30 Minutes Intravenous To Radiology 11/16/20 1400 11/17/20 0906   11/14/20 2100  DAPTOmycin (CUBICIN) 700 mg in sodium chloride 0.9 % IVPB  Status:  Discontinued        8 mg/kg  85.2 kg (Adjusted) 128 mL/hr over 30 Minutes Intravenous Every 48 hours 11/14/20 2006 11/17/20 1127   11/14/20 2045  cefTRIAXone (ROCEPHIN) 2 g in sodium chloride 0.9 % 100 mL IVPB  Status:  Discontinued        2 g 200 mL/hr over 30 Minutes Intravenous Every 24 hours 11/14/20 1948 11/17/20 1127   11/13/20 2230  cefTRIAXone (ROCEPHIN) 1 g in sodium chloride 0.9 % 100 mL IVPB  Status:  Discontinued        1 g 200 mL/hr over 30 Minutes Intravenous Every 24 hours 11/13/20 2223 11/14/20 1948          Subjective: No complaints at this  time  Objective: Vitals:   11/16/20 1824 11/16/20 2042 11/17/20 0618 11/17/20 1631  BP: 128/88 131/84 122/76 132/84  Pulse: 82 87 90 85  Resp: '17 18 18 16  '$ Temp: 98.2 F (36.8 C) 98.6 F (37 C) 98.1 F (36.7 C) 97.8 F (36.6 C)  TempSrc:  Oral Oral Oral  SpO2: 99% 100% 98% 99%  Weight:      Height:        Intake/Output Summary (Last 24 hours) at 11/17/2020 1847 Last data filed at 11/17/2020 1753 Gross per 24 hour  Intake 1404.93 ml  Output 4175 ml  Net -2770.07 ml   Filed Weights   11/14/20 0904 11/14/20 2154  Weight: 86.2 kg 79.8 kg    Examination:  General: No acute distress. Lungs: unlabored Neurological: Alert and oriented 3. Moves all extremities  4 with equal strength. Cranial nerves II through XII grossly intact. Skin: No rashes visible or lesions. Extremities: No edema.  Data Reviewed: I have personally reviewed following labs and imaging studies  CBC: Recent Labs  Lab 11/13/20 1951 11/14/20 0000 11/14/20 0542 11/15/20 0636 11/16/20 0639 11/17/20 0341  WBC 29.9*  --  29.8* 23.7* 19.8* 16.3*  NEUTROABS 28.3*  --   --  20.3* 16.5* 13.7*  HGB 6.0* 6.8* 6.6* 8.6* 8.8* 8.7*  HCT 19.9* 20.0* 20.9* 25.1* 26.2* 26.1*  MCV 93.9  --  91.7 86.3 85.9 86.1  PLT 324  --  276 257 269 [id number]    Basic Metabolic Panel: Recent Labs  Lab 11/13/20 2143 11/14/20 0000 11/14/20 0500 11/14/20 2326 11/15/20 0636 11/15/20 1520 11/16/20 0639 11/17/20 0341  NA  --    < > 134* 134* 136 135 137 136  K  --    < > 4.0 2.9* 2.9* 3.4* 3.7 3.6  CL  --   --  103 99 101 99 102 101  CO2  --   --  11* 15* 16* 17* 16* 18*  GLUCOSE  --   --  101* 96 91 88 86 83  BUN  --   --  117* 119* 118* 119* 117* 113*  CREATININE  --   --  12.46* 11.88* 11.81* 11.37* 11.90* 11.52*  CALCIUM  --   --  7.8* 7.7* 8.0* 8.1* 9.2 9.0  MG 1.4*  --   --   --  1.5*  --  1.9  --   PHOS 8.8*  --  9.7*  --  11.8*  --  11.3* 10.2*   < > = values in this interval not displayed.    GFR: Estimated  Creatinine Clearance: 10.4 mL/min (Davidmichael Zarazua) (by C-G formula based on SCr of 11.52 mg/dL (H)).  Liver Function Tests: Recent Labs  Lab 11/13/20 1951 11/14/20 0500 11/15/20 0636 11/16/20 0639 11/17/20 0341  AST 13*  --  8* 8* 10*  ALT 9  --  '9 9 8  '$ ALKPHOS 112  --  100 107 106  BILITOT 0.2*  --  0.7 0.9 0.6  PROT 7.2  --  6.5 6.8 6.8  ALBUMIN 2.7* 2.6* 2.4* 2.4* 2.3*  2.4*    CBG: No results for input(s): GLUCAP in the last 168 hours.   Recent Results (from the past 240 hour(s))  Resp Panel by RT-PCR (Flu Dharma Pare&B, Covid) Nasopharyngeal Swab     Status: None   Collection Time: 11/13/20  7:30 PM   Specimen: Nasopharyngeal Swab; Nasopharyngeal(NP) swabs in vial transport medium  Result Value Ref Range Status   SARS Coronavirus 2 by RT PCR NEGATIVE NEGATIVE Final    Comment: (NOTE) SARS-CoV-2 target nucleic acids are NOT DETECTED.  The SARS-CoV-2 RNA is generally detectable in upper respiratory specimens during the acute phase of infection. The lowest concentration of SARS-CoV-2 viral copies this assay can detect is 138 copies/mL. Aryana Wonnacott negative result does not preclude SARS-Cov-2 infection and should not be used as the sole basis for treatment or other patient management decisions. Destany Severns negative result may occur with  improper specimen collection/handling, submission of specimen other than nasopharyngeal swab, presence of viral mutation(s) within the areas targeted by this assay, and inadequate number of viral copies(<138 copies/mL). Wali Reinheimer negative result must be combined with clinical observations, patient history, and epidemiological information. The expected result is Negative.  Fact Sheet for Patients:  EntrepreneurPulse.com.au  Fact Sheet for Healthcare Providers:  IncredibleEmployment.be  This test is no t yet approved or cleared by the [place] and  has been authorized for detection and/or diagnosis of SARS-CoV-2 by FDA under an Emergency  Use Authorization (EUA). This EUA will remain  in effect (meaning this test can be used) for the duration of the COVID-19 declaration under Section 564(b)(1) of the Act, 21 U.S.C.section 360bbb-3(b)(1), unless the authorization is terminated  or revoked sooner.       Influenza [place] by PCR NEGATIVE NEGATIVE Final   Influenza B by PCR NEGATIVE NEGATIVE Final    Comment: (NOTE) The Xpert Xpress SARS-CoV-2/FLU/RSV plus assay is intended as an aid in the diagnosis of influenza from Nasopharyngeal swab specimens and should not be used as Shantika Bermea sole basis for treatment. Nasal washings and aspirates are unacceptable for Xpert Xpress SARS-CoV-2/FLU/RSV testing.  Fact Sheet for Patients: EntrepreneurPulse.com.au  Fact Sheet for Healthcare Providers: IncredibleEmployment.be  This test is not yet approved or cleared by the [place] FDA and has been authorized for detection and/or diagnosis of SARS-CoV-2 by FDA under an Emergency Use Authorization (EUA). This EUA will remain in effect (meaning this test can be used) for the duration of the COVID-19 declaration under Section 564(b)(1) of the Act, 21 U.S.C. section 360bbb-3(b)(1), unless the authorization is terminated or revoked.  Performed at [facility], [place] [address], [place], [place] [address]   Urine Culture     Status: None   Collection Time: 11/13/20 10:23 PM   Specimen: Urine, Clean Catch  Result Value Ref Range Status   Specimen Description URINE, CLEAN CATCH  Final   Special Requests NONE  Final   Culture   Final    NO GROWTH Performed at [facility], [place] [address], [place], [place] [address]    Report Status 11/15/2020 FINAL  Final  Culture, blood (routine x 2)     Status: None (Preliminary result)   Collection Time: 11/14/20  9:19 AM   Specimen: BLOOD RIGHT FOREARM  Result Value Ref Range Status   Specimen Description BLOOD RIGHT FOREARM  Final   Special Requests   Final     BOTTLES DRAWN AEROBIC AND ANAEROBIC Blood Culture results may not be optimal due to an excessive volume of blood received in culture bottles   Culture   Final    NO GROWTH 3 DAYS Performed at [facility], [place] [address], [place], [place] [address]    Report Status PENDING  Incomplete  Culture, blood (Routine X 2) w Reflex to ID Panel     Status: None (Preliminary result)   Collection Time: 11/14/20 11:20 PM   Specimen: BLOOD LEFT FOREARM  Result Value Ref Range Status   Specimen Description BLOOD LEFT FOREARM  Final   Special Requests   Final    BOTTLES DRAWN AEROBIC ONLY Blood Culture adequate volume   Culture   Final    NO GROWTH 2 DAYS Performed at [facility], [place] [address] [place], [place] [address]    Report Status PENDING  Incomplete         Radiology Studies: No results found.      Scheduled Meds:  Chlorhexidine Gluconate Cloth  6 each Topical Q0600   Chlorhexidine Gluconate Cloth  6 each Topical Q0600   darbepoetin (ARANESP) injection - NON-DIALYSIS  40 mcg Subcutaneous Q Sun-1800   feeding supplement (NEPRO CARB STEADY)  237 mL Oral Q24H   lanthanum  1,000 mg Oral TID WC   multivitamin  1 tablet Oral QHS   sodium bicarbonate  650 mg Oral TID   sodium chloride flush  3 mL Intravenous Q12H   vitamin B-12  1,000 mcg Oral Daily   Continuous Infusions:  sodium chloride       LOS: 3 days    Time spent: over 30 min    Fayrene Helper, MD [facility]   To contact the attending provider between 7A-7P or the covering provider during after hours 7P-7A, please log into the web site [email or web address] and access using universal [place] password for that web site. If you do not have the password, please call the hospital operator.  11/17/2020, 6:47 PM

## 2020-11-17 NOTE — Plan of Care (Signed)
  Problem: Education: Goal: Knowledge of General Education information will improve Description Including pain rating scale, medication(s)/side effects and non-pharmacologic comfort measures Outcome: Progressing   Problem: Clinical Measurements: Goal: Ability to maintain clinical measurements within normal limits will improve Outcome: Progressing Goal: Will remain free from infection Outcome: Progressing   Problem: Nutrition: Goal: Adequate nutrition will be maintained Outcome: Progressing   Problem: Coping: Goal: Level of anxiety will decrease Outcome: Progressing   Problem: Safety: Goal: Ability to remain free from injury will improve Outcome: Progressing   

## 2020-11-17 NOTE — TOC Progression Note (Signed)
Transition of Care [facility]) - Progression Note    Patient Details  Name: Micheal Bean MRN: [id number] Date of Birth: 1985-10-03  Transition of Care [facility]) CM/SW Contact  Bartholomew Crews, RN Phone Number: [phone] 11/17/2020, 8:28 AM  Clinical Narrative:     Received acknowledgement from financial counselor that patient has been referred to [facility] for Medicaid evaluation and referral.   Expected Discharge Plan: Home/Self Care Barriers to Discharge: Continued Medical Work up  Expected Discharge Plan and Services Expected Discharge Plan: Home/Self Care                                               Social Determinants of Health (SDOH) Interventions    Readmission Risk Interventions No flowsheet data found.

## 2020-11-18 ENCOUNTER — Inpatient Hospital Stay (HOSPITAL_COMMUNITY): Payer: Medicaid Other

## 2020-11-18 DIAGNOSIS — N179 Acute kidney failure, unspecified: Principal | ICD-10-CM

## 2020-11-18 HISTORY — PX: IR US GUIDE VASC ACCESS RIGHT: IMG2390

## 2020-11-18 HISTORY — PX: IR FLUORO GUIDE CV LINE RIGHT: IMG2283

## 2020-11-18 LAB — BASIC METABOLIC PANEL
Anion gap: 19 — ABNORMAL HIGH (ref 5–15)
BUN: 115 mg/dL — ABNORMAL HIGH (ref 6–20)
CO2: 17 mmol/L — ABNORMAL LOW (ref 22–32)
Calcium: 9.2 mg/dL (ref 8.9–10.3)
Chloride: 102 mmol/L (ref 98–111)
Creatinine, Ser: 11.87 mg/dL — ABNORMAL HIGH (ref 0.61–1.24)
GFR, Estimated: 5 mL/min — ABNORMAL LOW (ref >60)
Glucose, Bld: 83 mg/dL (ref 70–99)
Potassium: 3.7 mmol/L (ref 3.5–5.1)
Sodium: 138 mmol/L (ref 135–145)

## 2020-11-18 LAB — CBC
HCT: 26.3 % — ABNORMAL LOW (ref 39.0–52.0)
Hemoglobin: 8.6 g/dL — ABNORMAL LOW (ref 13.0–17.0)
MCH: 29 pg (ref 26.0–34.0)
MCHC: 32.7 g/dL (ref 30.0–36.0)
MCV: 88.6 fL (ref 80.0–100.0)
Platelets: 274 10*3/uL (ref 150–400)
RBC: 2.97 MIL/uL — ABNORMAL LOW (ref 4.22–5.81)
RDW: 14 % (ref 11.5–15.5)
WBC: 12.1 10*3/uL — ABNORMAL HIGH (ref 4.0–10.5)
nRBC: 0 % (ref 0.0–0.2)

## 2020-11-18 LAB — HEPATITIS B SURFACE ANTIBODY,QUALITATIVE: Hep B S Ab: REACTIVE — AB

## 2020-11-18 LAB — PROTEIN ELECTROPHORESIS, SERUM
A/G Ratio: 0.8 (ref 0.7–1.7)
Albumin ELP: 2.8 g/dL — ABNORMAL LOW (ref 2.9–4.4)
Alpha-1-Globulin: 0.4 g/dL (ref 0.0–0.4)
Alpha-2-Globulin: 0.9 g/dL (ref 0.4–1.0)
Beta Globulin: 0.9 g/dL (ref 0.7–1.3)
Gamma Globulin: 1.4 g/dL (ref 0.4–1.8)
Globulin, Total: 3.5 g/dL (ref 2.2–3.9)
Total Protein ELP: 6.3 g/dL (ref 6.0–8.5)

## 2020-11-18 LAB — HEPATITIS B SURFACE ANTIGEN: Hepatitis B Surface Ag: NONREACTIVE

## 2020-11-18 LAB — PHOSPHORUS: Phosphorus: 9.5 mg/dL — ABNORMAL HIGH (ref 2.5–4.6)

## 2020-11-18 LAB — PARATHYROID HORMONE, INTACT (NO CA): PTH: 1223 pg/mL — ABNORMAL HIGH (ref 15–65)

## 2020-11-18 LAB — MAGNESIUM: Magnesium: 1.9 mg/dL (ref 1.7–2.4)

## 2020-11-18 LAB — PATHOLOGIST SMEAR REVIEW

## 2020-11-18 LAB — HEPATITIS B CORE ANTIBODY, TOTAL: Hep B Core Total Ab: NONREACTIVE

## 2020-11-18 MED ORDER — LIDOCAINE HCL (PF) 1 % IJ SOLN
INTRAMUSCULAR | Status: AC | PRN
Start: 2020-11-18 — End: 2020-11-18
  Administered 2020-11-18: 10 mL

## 2020-11-18 MED ORDER — CEFAZOLIN SODIUM-DEXTROSE 2-4 GM/100ML-% IV SOLN
INTRAVENOUS | Status: AC | PRN
Start: 2020-11-18 — End: 2020-11-18
  Administered 2020-11-18: 2 g via INTRAVENOUS

## 2020-11-18 MED ORDER — FENTANYL CITRATE (PF) 100 MCG/2ML IJ SOLN
INTRAMUSCULAR | Status: AC
  Filled 2020-11-18: qty 2

## 2020-11-18 MED ORDER — SODIUM CHLORIDE 0.9 % IV SOLN: 100.0000 mL | INTRAVENOUS | Status: DC | PRN

## 2020-11-18 MED ORDER — LIDOCAINE HCL (PF) 1 % IJ SOLN: 5.0000 mL | INTRAMUSCULAR | Status: DC | PRN

## 2020-11-18 MED ORDER — PENTAFLUOROPROP-TETRAFLUOROETH EX AERO: 1.0000 "application " | INHALATION_SPRAY | CUTANEOUS | Status: DC | PRN

## 2020-11-18 MED ORDER — MIDAZOLAM HCL 2 MG/2ML IJ SOLN
INTRAMUSCULAR | Status: AC | PRN
  Administered 2020-11-18 (×2): 1 mg via INTRAVENOUS

## 2020-11-18 MED ORDER — FENTANYL CITRATE (PF) 100 MCG/2ML IJ SOLN
INTRAMUSCULAR | Status: AC | PRN
  Administered 2020-11-18 (×2): 50 ug via INTRAVENOUS

## 2020-11-18 MED ORDER — LIDOCAINE-PRILOCAINE 2.5-2.5 % EX CREA: 1.0000 "application " | TOPICAL_CREAM | CUTANEOUS | Status: DC | PRN

## 2020-11-18 MED ORDER — CEFAZOLIN SODIUM-DEXTROSE 2-4 GM/100ML-% IV SOLN
INTRAVENOUS | Status: AC
  Filled 2020-11-18: qty 100

## 2020-11-18 MED ORDER — MIDAZOLAM HCL 2 MG/2ML IJ SOLN
INTRAMUSCULAR | Status: AC
  Filled 2020-11-18: qty 2

## 2020-11-18 MED ORDER — LIDOCAINE HCL 1 % IJ SOLN
INTRAMUSCULAR | Status: AC
  Filled 2020-11-18: qty 20

## 2020-11-18 MED ORDER — HEPARIN SODIUM (PORCINE) 1000 UNIT/ML IJ SOLN
INTRAMUSCULAR | Status: AC
  Filled 2020-11-18: qty 1

## 2020-11-18 MED ORDER — HEPARIN SODIUM (PORCINE) 1000 UNIT/ML IJ SOLN
INTRAMUSCULAR | Status: AC
  Filled 2020-11-18: qty 3

## 2020-11-18 MED ORDER — HEPARIN SODIUM (PORCINE) 1000 UNIT/ML DIALYSIS
1000.0000 [IU] | INTRAMUSCULAR | Status: DC | PRN
  Administered 2020-11-20: 1000 [IU] via INTRAVENOUS_CENTRAL

## 2020-11-18 MED ORDER — ALTEPLASE 2 MG IJ SOLR: 2.0000 mg | Freq: Once | INTRAMUSCULAR | Status: DC | PRN

## 2020-11-18 NOTE — Plan of Care (Signed)

## 2020-11-18 NOTE — Progress Notes (Signed)
PROGRESS NOTE    Micheal Bean  GOT:[id number] DOB: 23-Mar-1986 DOA: 11/13/2020 PCP: Patient, No Pcp Per (Inactive)    Chief Complaint  Patient presents with   Dizziness    Brief Narrative:  Micheal Bean is Micheal Bean 35 y.o. male with medical history significant of history of right nephrectomy as Micheal Bean infant presenting with constellation of intermittent symptoms including headache, lightheadedness and chest tightness, As above has had 3-4 days of head ache, lightheadedness, chest tightness, nausea, diarrhea.  He tried taking some fluids at home did seem to help he also tried taking Micheal Bean dose of his mother's amlodipine but did not seem to help.  He does feel better laying down but it feels worse standing up.  He does note losing 20 pounds in the last 2 months.  He was seen in urgent care and then sent to the ED for further evaluation.  He's been admitted for aki on ckd, left sided hydro with possible UTI, severe anemia, and leukocytosis.  Renal and ID are following.  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 - urology c/s, appreciate recommendations - foley - may need cystoscopy/retrograde study if creatinine not improving/hydro not improving > Nephrology consulted, appreciate recs - planning tunneled catheter and start HD 8/16 - SPEP, UPEP, free light chains - elevated kappa/lamda light chain - may need to discuss with heme, follow SPEP - monoclonal protein not apparent - Appreciate nephrology recommendations T - Continue with  catheter placement - Trend renal function and electrolytes - worsened today  Leukocytosis  Edema and Soft Tissue Thickening around R [place] Joint  Pyuria Concern for UTI, urine culture with no growth  edema and soft tissue thickening around R [place] joint with heterogenous lucency involving head of R clavicle (similar changes involving head of L clavicle and focal indeterminate lucent lesion in R first rib) --- concerning for infection/osteo MRI done today, fairly symmetric marrow edema in both clavicular heads with mild surrounding soft tissue swelling (argues against infection, favors inflammatory or crystal arthropathy) Currently on ceftriaxone to cover for UTI (UA with squams, but also nitrites) Urine culture no growth Follow blood cultures (both sets unfortunately collected after abx, follow- Ng to date) Appreciate ID, recommending holding off on abx at this point  Elevated Inflammatory Markers ESR >140, CRP 16 With imaging of [place] joint as noted above ? Inflammatory/crystal arthropathy Follow ANA, anti CCP, RF  Iron Deficiency Anemia  Anemia of Chronic Disease  Vitamin B12 Deficiency > Noted to have significant anemia with Ryker Pherigo hemoglobin of 6. > iron deficiency, elevated ferritin - b12 and folate low normal - follow MMA (elevated suggesting B12 deficiency), supplement b12/folate for now - will need iron supplementation - LDH wnl, haptoglobin elevated - not suggestive of hemolysis - suspect related to iron def anemia and his kidney disease - no hx dark or blood stool - Check FOBT  - s/p 3 unit pRBC - smear review notable for mild left shift -- pathologist smear review leukocytosis  Edema and soft tissue thickening around R sternoclavicular joint with heterogenous lucency involving the head of the R clavicle with similar changes involving head of L clavicle  Indeterminate lucent lesion in R first rib posteriorly Concerning for inflammation as noted above, malignancy less likely based on  discussion with rads today Per discussion with rads today, indeterminate lucent lesion in first rib posteriorly maybe hemangioma based on his review of CT scan May need to discuss with heme/onc  Elevated D Dimer Suspect related to inflammation  Negative LE [place] for DVT Consider VQ scan, but no evidence of hypoxia, no CP - suspect dimer related to other systemic issues, but low threshold for further w/u    [facility] requested transfer to [facility] in hospitalization.  [facility] declined given family request.  Consider if medical need going further.  He can also follow outpatient as desired.  DVT prophylaxis: SCD Code Status: full  Family Communication: mother at bedside Disposition:   Status is: Observation  The patient will require care spanning > 2 midnights and should be moved to inpatient because: Inpatient level of care appropriate due to severity of illness  Dispo: The patient is from: Home              Anticipated d/c is to: Home              Patient currently is not medically stable to d/c.   Difficult to place patient No       Consultants:  Nephrology CT surgery Urology ID  Procedures:  LE [place] Summary:  BILATERAL:  - No evidence of deep vein thrombosis seen in the lower extremities,  bilaterally.  - No evidence of superficial venous thrombosis in the lower extremities,  bilaterally.  -No evidence of popliteal cyst, bilaterally.   Antimicrobials: Anti-infectives (From admission, onward)    Start     Dose/Rate Route Frequency Ordered Stop   11/18/20 1522  ceFAZolin (ANCEF) IVPB 2g/100 mL premix        over 30 Minutes Intravenous Continuous PRN 11/18/20 1532 11/18/20 1522   11/18/20 1512  ceFAZolin (ANCEF) 2-4 GM/100ML-% IVPB       Note to Pharmacy: Micheal Bean   : cabinet override      11/18/20 1512 11/19/20 0329   11/18/20 0000  ceFAZolin (ANCEF) IVPB 2g/100 mL premix  Status:  Discontinued        2 g 200 mL/hr over 30 Minutes Intravenous To Radiology  11/17/20 1014 11/17/20 1014   11/17/20 0000  ceFAZolin (ANCEF) IVPB 2g/100 mL premix  Status:  Discontinued        2 g 200 mL/hr over 30 Minutes Intravenous To Radiology 11/16/20 1400 11/17/20 0906   11/14/20 2100  DAPTOmycin (CUBICIN) 700 mg in sodium chloride 0.9 % IVPB  Status:  Discontinued        8 mg/kg  85.2 kg (Adjusted) 128 mL/hr over 30 Minutes Intravenous Every 48 hours 11/14/20 2006 11/17/20 1127   11/14/20 2045  cefTRIAXone (ROCEPHIN) 2 g in sodium chloride 0.9 % 100 mL IVPB  Status:  Discontinued        2 g 200 mL/hr over 30 Minutes Intravenous Every 24 hours 11/14/20 1948 11/17/20 1127   11/13/20 2230  cefTRIAXone (ROCEPHIN) 1 g in sodium chloride 0.9 % 100 mL IVPB  Status:  Discontinued        1 g 200 mL/hr over 30 Minutes Intravenous Every 24 hours 11/13/20 2223 11/14/20 1948  Subjective: No complaints  Objective: Vitals:   11/18/20 1520 11/18/20 1525 11/18/20 1530 11/18/20 1739  BP: 127/81 128/80 130/84 (!) 142/75  Pulse: 67 64 66 74  Resp: $Remo'16 14 12 19  'TwiPT$ Temp:    98.2 F (36.8 C)  TempSrc:      SpO2: 99% 100% 100% 100%  Weight:      Height:        Intake/Output Summary (Last 24 hours) at 11/18/2020 2044 Last data filed at 11/18/2020 0435 Gross per 24 hour  Intake 0 ml  Output 1700 ml  Net -1700 ml   Filed Weights   11/14/20 0904 11/14/20 2154 11/17/20 2055  Weight: 86.2 kg 79.8 kg 86.2 kg    Examination:  General: No acute distress. On dialysis Lungs: unlabored Neurological: Alert and oriented 3. Moves all extremities 4 with equal strength. Cranial nerves II through XII grossly intact. Skin: Warm and dry. No rashes or lesions. Extremities: No clubbing or cyanosis. No edema.  Data Reviewed: I have personally reviewed following labs and imaging studies  CBC: Recent Labs  Lab 11/13/20 1951 11/14/20 0000 11/14/20 0542 11/15/20 0636 11/16/20 0639 11/17/20 0341 11/18/20 0953  WBC 29.9*  --  29.8* 23.7* 19.8* 16.3* 12.1*   NEUTROABS 28.3*  --   --  20.3* 16.5* 13.7*  --   HGB 6.0*   < > 6.6* 8.6* 8.8* 8.7* 8.6*  HCT 19.9*   < > 20.9* 25.1* 26.2* 26.1* 26.3*  MCV 93.9  --  91.7 86.3 85.9 86.1 88.6  PLT 324  --  276 257 269 275 274   < > = values in this interval not displayed.    Basic Metabolic Panel: Recent Labs  Lab 11/13/20 2143 11/14/20 0000 11/14/20 0500 11/14/20 2326 11/15/20 0636 11/15/20 1520 11/16/20 0639 11/17/20 0341 11/18/20 0953  NA  --    < > 134*   < > 136 135 137 136 138  K  --    < > 4.0   < > 2.9* 3.4* 3.7 3.6 3.7  CL  --   --  103   < > 101 99 102 101 102  CO2  --   --  11*   < > 16* 17* 16* 18* 17*  GLUCOSE  --   --  101*   < > 91 88 86 83 83  BUN  --   --  117*   < > 118* 119* 117* 113* 115*  CREATININE  --   --  12.46*   < > 11.81* 11.37* 11.90* 11.52* 11.87*  CALCIUM  --   --  7.8*   < > 8.0* 8.1* 9.2 9.0 9.2  MG 1.4*  --   --   --  1.5*  --  1.9  --  1.9  PHOS 8.8*  --  9.7*  --  11.8*  --  11.3* 10.2* 9.5*   < > = values in this interval not displayed.    GFR: Estimated Creatinine Clearance: 10.7 mL/min (Nelli Swalley) (by C-G formula based on SCr of 11.87 mg/dL (H)).  Liver Function Tests: Recent Labs  Lab 11/13/20 1951 11/14/20 0500 11/15/20 0636 11/16/20 0639 11/17/20 0341  AST 13*  --  8* 8* 10*  ALT 9  --  $R'9 9 8  'aP$ ALKPHOS 112  --  100 107 106  BILITOT 0.2*  --  0.7 0.9 0.6  PROT 7.2  --  6.5 6.8 6.8  ALBUMIN 2.7* 2.6* 2.4* 2.4* 2.3*  2.4*  CBG: No results for input(s): GLUCAP in the last 168 hours.   Recent Results (from the past 240 hour(s))  Resp Panel by RT-PCR (Flu Elia Keenum&B, Covid) Nasopharyngeal Swab     Status: None   Collection Time: 11/13/20  7:30 PM   Specimen: Nasopharyngeal Swab; Nasopharyngeal(NP) swabs in vial transport medium  Result Value Ref Range Status   SARS Coronavirus 2 by RT PCR NEGATIVE NEGATIVE Final    Comment: (NOTE) SARS-CoV-2 target nucleic acids are NOT DETECTED.  The SARS-CoV-2 RNA is generally detectable in upper  respiratory specimens during the acute phase of infection. The lowest concentration of SARS-CoV-2 viral copies this assay can detect is 138 copies/mL. Jurell Basista negative result does not preclude SARS-Cov-2 infection and should not be used as the sole basis for treatment or other patient management decisions. Moe Graca negative result may occur with  improper specimen collection/handling, submission of specimen other than nasopharyngeal swab, presence of viral mutation(s) within the areas targeted by this assay, and inadequate number of viral copies(<138 copies/mL). Keagan Brislin negative result must be combined with clinical observations, patient history, and epidemiological information. The expected result is Negative.  Fact Sheet for Patients:  EntrepreneurPulse.com.au  Fact Sheet for Healthcare Providers:  IncredibleEmployment.be  This test is no t yet approved or cleared by the [place] FDA and  has been authorized for detection and/or diagnosis of SARS-CoV-2 by FDA under an Emergency Use Authorization (EUA). This EUA will remain  in effect (meaning this test can be used) for the duration of the COVID-19 declaration under Section 564(b)(1) of the Act, 21 U.S.C.section 360bbb-3(b)(1), unless the authorization is terminated  or revoked sooner.       Influenza Prestyn Mahn by PCR NEGATIVE NEGATIVE Final   Influenza B by PCR NEGATIVE NEGATIVE Final    Comment: (NOTE) The Xpert Xpress SARS-CoV-2/FLU/RSV plus assay is intended as an aid in the diagnosis of influenza from Nasopharyngeal swab specimens and should not be used as Snigdha Howser sole basis for treatment. Nasal washings and aspirates are unacceptable for Xpert Xpress SARS-CoV-2/FLU/RSV testing.  Fact Sheet for Patients: EntrepreneurPulse.com.au  Fact Sheet for Healthcare Providers: IncredibleEmployment.be  This test is not yet approved or cleared by the [place] FDA and has been  authorized for detection and/or diagnosis of SARS-CoV-2 by FDA under an Emergency Use Authorization (EUA). This EUA will remain in effect (meaning this test can be used) for the duration of the COVID-19 declaration under Section 564(b)(1) of the Act, 21 U.S.C. section 360bbb-3(b)(1), unless the authorization is terminated or revoked.  Performed at [facility], [place] [address] [place], [place] [address]   Urine Culture     Status: None   Collection Time: 11/13/20 10:23 PM   Specimen: Urine, Clean Catch  Result Value Ref Range Status   Specimen Description URINE, CLEAN CATCH  Final   Special Requests NONE  Final   Culture   Final    NO GROWTH Performed at [facility], [place] [address], [place], [place] [address]    Report Status 11/15/2020 FINAL  Final  Culture, blood (routine x 2)     Status: None (Preliminary result)   Collection Time: 11/14/20  9:19 AM   Specimen: BLOOD RIGHT FOREARM  Result Value Ref Range Status   Specimen Description BLOOD RIGHT FOREARM  Final   Special Requests   Final    BOTTLES DRAWN AEROBIC AND ANAEROBIC Blood Culture results may not be optimal due to an excessive volume of blood received in culture bottles  Culture   Final    NO GROWTH 4 DAYS Performed at [facility], [place] [address], [place], [place] [address]    Report Status PENDING  Incomplete  Culture, blood (Routine X 2) w Reflex to ID Panel     Status: None (Preliminary result)   Collection Time: 11/14/20 11:20 PM   Specimen: BLOOD LEFT FOREARM  Result Value Ref Range Status   Specimen Description BLOOD LEFT FOREARM  Final   Special Requests   Final    BOTTLES DRAWN AEROBIC ONLY Blood Culture adequate volume   Culture   Final    NO GROWTH 3 DAYS Performed at [facility], [place] [address], [place],  [address]    Report Status PENDING  Incomplete         Radiology Studies: MR STERNUM WO CONTRAST  Result Date: 11/18/2020 CLINICAL DATA:  Abnormal right  sternoclavicular joint on CT. EXAM: MR CHEST WITHOUT CONTRAST TECHNIQUE: Multiplanar, multisequence MR imaging of the sternum was performed. No intravenous contrast was administered. COMPARISON:  CT chest dated November 13, 2020. FINDINGS: Bones/Joint/Cartilage Fairly symmetric mild marrow edema in both clavicular heads with mild surrounding soft tissue swelling. No fracture or dislocation. Small bilateral sternoclavicular joint effusions. Ligaments Sternoclavicular ligaments are intact. Muscles and Tendons Intact.  Mild muscle edema around the right sternoclavicular joint. Soft tissue No fluid collection or hematoma.  No soft tissue mass. IMPRESSION: 1. Fairly symmetric marrow edema in both clavicular heads with mild surrounding soft tissue swelling. While nonspecific, the relatively symmetric appearance argues against infection and would more favor an inflammatory or crystal arthropathy. However, osteomyelitis of the right clavicular head remains in the differential given unilateral erosive appearance on CT. No abscess. Electronically Signed   By: Titus Dubin M.D.   On: 11/18/2020 09:56   IR Fluoro Guide CV Line Right  Result Date: 11/18/2020 INDICATION: End-stage renal disease EXAM: Ultrasound fluoroscopy guided tunneled IJ hemodialysis catheter placement MEDICATIONS: Ancef 2 gm IV; The antibiotic was administered within an appropriate time interval prior to skin puncture. ANESTHESIA/SEDATION: Moderate (conscious) sedation was employed during this procedure. Rhodes Calvert total of Versed 2 mg and Fentanyl 100 mcg was administered intravenously. Moderate Sedation Time: 9 minutes. The patient's level of consciousness and vital signs were monitored continuously by radiology nursing throughout the procedure under my direct supervision. FLUOROSCOPY TIME:  Fluoroscopy Time: 0 minutes 48 seconds (3 mGy). COMPLICATIONS: None immediate. PROCEDURE: Informed written consent was obtained from the patient after Shaquan Puerta thorough  discussion of the procedural risks, benefits and alternatives. All questions were addressed. Maximal Sterile Barrier Technique was utilized including caps, mask, sterile gowns, sterile gloves, sterile drape, hand hygiene and skin antiseptic. Makayla Lanter timeout was performed prior to the initiation of the procedure. The right internal jugular vein was evaluated with ultrasound and shown to be patent. Pascale Maves permanent ultrasound image was obtained and placed in the patient's medical record. Using sterile gel and Aliece Honold sterile probe cover, the right internal jugular vein was entered with Anna Beaird 21 ga needle during real time ultrasound guidance. 0.018 inch guidewire placed and 21 ga needle exchanged for transitional dilator set. Utilizing fluoroscopy, 0.035 inch guidewire advanced through the needle without difficulty. Seriel dilation was performed and peel-away sheath was placed. Attention then turned to the right anterior upper chest. Following local lidocaine administration, the hemodialysis catheter was tunneled from the chest wall to the venotomy site. The catheter was inserted through the peel-away sheath. The tip of the catheter was positioned within the right  atrium using fluoroscopic guidance. All lumens of the catheter aspirated and flushed well. The dialysis lumens were locked with Heparin. The catheter was secured to the skin with suture. The insertion site was covered with Bodin Gorka Biopatch and sterile dressing. IMPRESSION: 19 cm tunneled right IJ hemodialysis catheter ready for use. Electronically Signed   By: Acquanetta Belling M.D.   On: 11/18/2020 15:42   IR US Guide Vasc Access Right  Result Date: 11/18/2020 INDICATION: End-stage renal disease EXAM: Ultrasound fluoroscopy guided tunneled IJ hemodialysis catheter placement MEDICATIONS: Ancef 2 gm IV; The antibiotic was administered within an appropriate time interval prior to skin puncture. ANESTHESIA/SEDATION: Moderate (conscious) sedation was employed during this procedure. Kailly Richoux total  of Versed 2 mg and Fentanyl 100 mcg was administered intravenously. Moderate Sedation Time: 9 minutes. The patient's level of consciousness and vital signs were monitored continuously by radiology nursing throughout the procedure under my direct supervision. FLUOROSCOPY TIME:  Fluoroscopy Time: 0 minutes 48 seconds (3 mGy). COMPLICATIONS: None immediate. PROCEDURE: Informed written consent was obtained from the patient after Indy Kuck thorough discussion of the procedural risks, benefits and alternatives. All questions were addressed. Maximal Sterile Barrier Technique was utilized including caps, mask, sterile gowns, sterile gloves, sterile drape, hand hygiene and skin antiseptic. Juston Goheen timeout was performed prior to the initiation of the procedure. The right internal jugular vein was evaluated with ultrasound and shown to be patent. Porche Steinberger permanent ultrasound image was obtained and placed in the patient's medical record. Using sterile gel and Richanda Darin sterile probe cover, the right internal jugular vein was entered with Elizeo Rodriques 21 ga needle during real time ultrasound guidance. 0.018 inch guidewire placed and 21 ga needle exchanged for transitional dilator set. Utilizing fluoroscopy, 0.035 inch guidewire advanced through the needle without difficulty. Seriel dilation was performed and peel-away sheath was placed. Attention then turned to the right anterior upper chest. Following local lidocaine administration, the hemodialysis catheter was tunneled from the chest wall to the venotomy site. The catheter was inserted through the peel-away sheath. The tip of the catheter was positioned within the right atrium using fluoroscopic guidance. All lumens of the catheter aspirated and flushed well. The dialysis lumens were locked with Heparin. The catheter was secured to the skin with suture. The insertion site was covered with Witten Certain Biopatch and sterile dressing. IMPRESSION: 19 cm tunneled right IJ hemodialysis catheter ready for use. Electronically Signed    By: Acquanetta Belling M.D.   On: 11/18/2020 15:42        Scheduled Meds:  Chlorhexidine Gluconate Cloth  6 each Topical Q0600   Chlorhexidine Gluconate Cloth  6 each Topical Q0600   darbepoetin (ARANESP) injection - NON-DIALYSIS  40 mcg Subcutaneous Q Sun-1800   feeding supplement (NEPRO CARB STEADY)  237 mL Oral Q24H   fentaNYL       heparin sodium (porcine)       lanthanum  1,000 mg Oral TID WC   lidocaine       midazolam       multivitamin  1 tablet Oral QHS   sodium bicarbonate  650 mg Oral TID   sodium chloride flush  3 mL Intravenous Q12H   vitamin B-12  1,000 mcg Oral Daily   Continuous Infusions:  sodium chloride     sodium chloride     sodium chloride     ceFAZolin       LOS: 4 days    Time spent: over 30 min    Lacretia Nicks, MD [facility]  To contact the attending provider between 7A-7P or the covering provider during after hours 7P-7A, please log into the web site [email or web address] and access using universal [place] password for that web site. If you do not have the password, please call the hospital operator.  11/18/2020, 8:44 PM

## 2020-11-18 NOTE — Progress Notes (Addendum)
[facility]    Date of Admission:  11/13/2020   Total days of antibiotics 4/ off of abtx x 48hr          ID: Micheal Bean is a 35 y.o. male with  leukocytosis/elevated inflammatory markers and incidental abnormal sternal imaging Principal Problem:   Acute renal failure (ARF) (HCC) Active Problems:   Acute lower UTI   Leukocytosis   Bone lesion   AKI (acute kidney injury) ([place])   Osteomyelitis (HCC)    Subjective: Remains afebrile, no pain along sternum. He underwent MRI of sternum - imaging does not comment on any lytic lesions, appears more consistent with possible renal osteodystrophy/bilateral changes which would not be consistent with infectious process  WBC continues to trend down  ROS: poor sleep.  Medications:   Chlorhexidine Gluconate Cloth  6 each Topical Q0600   Chlorhexidine Gluconate Cloth  6 each Topical Q0600   darbepoetin (ARANESP) injection - NON-DIALYSIS  40 mcg Subcutaneous Q Sun-1800   feeding supplement (NEPRO CARB STEADY)  237 mL Oral Q24H   lanthanum  1,000 mg Oral TID WC   multivitamin  1 tablet Oral QHS   sodium bicarbonate  650 mg Oral TID   sodium chloride flush  3 mL Intravenous Q12H   vitamin B-12  1,000 mcg Oral Daily    Objective: Vital signs in last 24 hours: Temp:  [97.8 F (36.6 C)-98.9 F (37.2 C)] 98.9 F (37.2 C) (08/16 0920) Pulse Rate:  [58-85] 58 (08/16 0920) Resp:  [16-19] 19 (08/16 0920) BP: (128-132)/(73-84) 128/73 (08/16 0920) SpO2:  [97 %-99 %] 97 % (08/16 0920) Weight:  [86.2 kg] 86.2 kg (08/15 2055) Physical Exam  Constitutional: He is oriented to person, place, and time. He appears well-developed and well-nourished. No distress.  HENT:  Mouth/Throat: Oropharynx is clear and moist. No oropharyngeal exudate.  Cardiovascular: Normal rate, regular rhythm and normal heart sounds. Exam reveals no gallop and no friction rub.  No murmur heard.  Pulmonary/Chest: Effort normal and breath sounds  normal. No respiratory distress. He has no wheezes.  Abdominal: Soft. Bowel sounds are normal. He exhibits no distension. There is no tenderness.  Lymphadenopathy:  He has no cervical adenopathy.  Neurological: He is alert and oriented to person, place, and time.  Skin: Skin is warm and dry. No rash noted. No erythema.  Psychiatric: He has a normal mood and affect. His behavior is normal.   Lab Results Recent Labs    11/17/20 0341 11/18/20 0953  WBC 16.3* 12.1*  HGB 8.7* 8.6*  HCT 26.1* 26.3*  NA 136 138  K 3.6 3.7  CL 101 102  CO2 18* 17*  BUN 113* 115*  CREATININE 11.52* 11.87*   Liver Panel Recent Labs    11/16/20 0639 11/17/20 0341  PROT 6.8 6.8  ALBUMIN 2.4* 2.3*  2.4*  AST 8* 10*  ALT 9 8  ALKPHOS 107 106  BILITOT 0.9 0.6  BILIDIR  --  <0.1  IBILI  --  NOT CALCULATED   Sedimentation Rate Recent Labs    11/17/20 0341  ESRSEDRATE >140*   C-Reactive Protein Recent Labs    11/17/20 0341  CRP 16.4*    Microbiology: reviewed Studies/Results: MR STERNUM WO CONTRAST  Result Date: 11/18/2020 CLINICAL DATA:  Abnormal right sternoclavicular joint on CT. EXAM: MR CHEST WITHOUT CONTRAST TECHNIQUE: Multiplanar, multisequence MR imaging of the sternum was performed. No intravenous contrast was administered. COMPARISON:  CT chest dated November 13, 2020.  FINDINGS: Bones/Joint/Cartilage Fairly symmetric mild marrow edema in both clavicular heads with mild surrounding soft tissue swelling. No fracture or dislocation. Small bilateral sternoclavicular joint effusions. Ligaments Sternoclavicular ligaments are intact. Muscles and Tendons Intact.  Mild muscle edema around the right sternoclavicular joint. Soft tissue No fluid collection or hematoma.  No soft tissue mass. IMPRESSION: 1. Fairly symmetric marrow edema in both clavicular heads with mild surrounding soft tissue swelling. While nonspecific, the relatively symmetric appearance argues against infection and would more  favor an inflammatory or crystal arthropathy. However, osteomyelitis of the right clavicular head remains in the differential given unilateral erosive appearance on CT. No abscess. Electronically Signed   By: Titus Dubin M.D.   On: 11/18/2020 09:56     Assessment/Plan: Incidental abnormal clavicular heads/marrow edema noted on mri, not c/w with acute/chronic infection. Recommend to still pause on any empiric abtx for now since patient is clinically stable. There is not joint fluid to aspirate and suspect bone biopsy may cause more harm. Recommend to watch over time to see if patient has symptoms/or imaging worsens to consider infectious process. 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 Imaging has been discussed with dr Nelia Shi from radiology and dr Florene Glen.  Leukocytosis = continue to monitor CBC  Will plan to see patient as an outpatient on 8/25 in follow up.  ESRD? Acute on CKD 4 = defer to Dr Posey Pronto for management and need for HD  Will sign off.  [facility] Cell: [phone] Pager: [phone]  11/18/2020, 3:02 PM

## 2020-11-18 NOTE — TOC Progression Note (Signed)
Transition of Care [facility]) - Progression Note    Patient Details  Name: Micheal Bean MRN: [id number] Date of Birth: 1986/03/22  Transition of Care [facility]) CM/SW Contact  Tom-Johnson, Renea Ee, RN Phone Number: 11/18/2020, 1:10 PM  Clinical Narrative:    Checked on patient about TOC needs. Lives with mom and mom at bedside. Patient does not currently have a PCP and agreed to be set up with [facility]. Information in AVS. Mom transport to and from appointments and errands. Patient does not have any DME at home. Will continue to assess on that following PT/OT recommendation. Awaiting Medicaid approval. Will continue to follow up for TOC needs.   Expected Discharge Plan: Home/Self Care Barriers to Discharge: Continued Medical Work up  Expected Discharge Plan and Services Expected Discharge Plan: Home/Self Care                                               Social Determinants of Health (SDOH) Interventions    Readmission Risk Interventions No flowsheet data found.

## 2020-11-18 NOTE — Progress Notes (Signed)
Patient ID: Micheal Bean, male   DOB: [birth year], 35 y.o.   MRN: [id number]  [facility] Progress Note   Assessment/ Plan:   1.  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  I have ordered for stat labs this morning to assess renal function and to determine whether he will need dialysis or has some meaningful renal recovery (based on downtrending BUN/creatinine seen yesterday). 2.  Anemia: Secondary to chronic kidney disease/iron deficiency.  Status post intravenous iron and started on ESA. 3.  Metabolic acidosis: Status post initial management with sodium bicarbonate drip and transition to oral sodium bicarbonate. 4.  Possible osteomyelitis versus bone tumor right sternoclavicular joint: Previously on broad-spectrum antimicrobial therapy with daptomycin/ceftriaxone-this was discontinued by Dr. Graylon Good (ID) and a follow-up MRI done this morning. 5.  Chronic kidney disease/metabolic bone disease: Transitioned from sevelamer to lanthanum yesterday with labs showing persistent hyperphosphatemia.  Subjective:   Labs not done this morning because he was down getting an MRI   Objective:   BP 128/73 (BP Location: Left Arm)   Pulse (!) 58   Temp 98.9 F (37.2 C) (Oral)   Resp 19   Ht '6\' 3"'$  (1.905 m)   Wt 86.2 kg   SpO2 97%   BMI 23.75 kg/m   Intake/Output Summary (Last 24 hours) at 11/18/2020 0932 Last data filed at 11/18/2020 0435 Gross per 24 hour  Intake 870.1 ml  Output 2800 ml  Net -1929.9 ml   Weight change:   Physical Exam: Gen: Appears comfortable resting in bed.  Mother at bedside. CVS: Pulse regular rhythm, normal rate, S1 and S2 normal Resp: Clear to auscultation bilaterally, no rales/rhonchi Abd: Soft, flat, nontender, bowel sounds  normal Ext: No lower extremity edema  Imaging: No results found.  Labs: BMET Recent Labs  Lab 11/13/20 1951 11/13/20 2143 11/14/20 0000 11/14/20 0500 11/14/20 2326 11/15/20 0636 11/15/20 1520 11/16/20 0639 11/17/20 0341  NA 131*  --  134* 134* 134* 136 135 137 136  K 4.1  --  4.5 4.0 2.9* 2.9* 3.4* 3.7 3.6  CL 106  --   --  103 99 101 99 102 101  CO2 10*  --   --  11* 15* 16* 17* 16* 18*  GLUCOSE 118*  --   --  101* 96 91 88 86 83  BUN 111*  --   --  117* 119* 118* 119* 117* 113*  CREATININE 12.59*  --   --  12.46* 11.88* 11.81* 11.37* 11.90* 11.52*  CALCIUM 8.0*  --   --  7.8* 7.7* 8.0* 8.1* 9.2 9.0  PHOS  --  8.8*  --  9.7*  --  11.8*  --  11.3* 10.2*   CBC Recent Labs  Lab 11/13/20 1951 11/14/20 0000 11/14/20 0542 11/15/20 0636 11/16/20 0639 11/17/20 0341  WBC 29.9*  --  29.8* 23.7* 19.8* 16.3*  NEUTROABS 28.3*  --   --  20.3* 16.5* 13.7*  HGB 6.0*   < > 6.6* 8.6* 8.8* 8.7*  HCT 19.9*   < > 20.9* 25.1* 26.2* 26.1*  MCV 93.9  --  91.7 86.3 85.9 86.1  PLT 324  --  276 257 269 275   < > = values in this interval not displayed.    Medications:     Chlorhexidine Gluconate  Cloth  6 each Topical Q0600   Chlorhexidine Gluconate Cloth  6 each Topical Q0600   darbepoetin (ARANESP) injection - NON-DIALYSIS  40 mcg Subcutaneous Q Sun-1800   feeding supplement (NEPRO CARB STEADY)  237 mL Oral Q24H   lanthanum  1,000 mg Oral TID WC   multivitamin  1 tablet Oral QHS   sodium bicarbonate  650 mg Oral TID   sodium chloride flush  3 mL Intravenous Q12H   vitamin B-12  1,000 mcg Oral Daily   Elmarie Shiley, MD 11/18/2020, 9:32 AM

## 2020-11-19 LAB — CBC WITH DIFFERENTIAL/PLATELET
Abs Immature Granulocytes: 0.14 10*3/uL — ABNORMAL HIGH (ref 0.00–0.07)
Basophils Absolute: 0 10*3/uL (ref 0.0–0.1)
Basophils Relative: 0 %
Eosinophils Absolute: 0.2 10*3/uL (ref 0.0–0.5)
Eosinophils Relative: 1 %
HCT: 27.7 % — ABNORMAL LOW (ref 39.0–52.0)
Hemoglobin: 9 g/dL — ABNORMAL LOW (ref 13.0–17.0)
Immature Granulocytes: 1 %
Lymphocytes Relative: 11 %
Lymphs Abs: 1.2 10*3/uL (ref 0.7–4.0)
MCH: 28.7 pg (ref 26.0–34.0)
MCHC: 32.5 g/dL (ref 30.0–36.0)
MCV: 88.2 fL (ref 80.0–100.0)
Monocytes Absolute: 1 10*3/uL (ref 0.1–1.0)
Monocytes Relative: 9 %
Neutro Abs: 8.7 10*3/uL — ABNORMAL HIGH (ref 1.7–7.7)
Neutrophils Relative %: 78 %
Platelets: 286 10*3/uL (ref 150–400)
RBC: 3.14 MIL/uL — ABNORMAL LOW (ref 4.22–5.81)
RDW: 13.9 % (ref 11.5–15.5)
WBC: 11.2 10*3/uL — ABNORMAL HIGH (ref 4.0–10.5)
nRBC: 0 % (ref 0.0–0.2)

## 2020-11-19 LAB — COMPREHENSIVE METABOLIC PANEL
ALT: 10 U/L (ref 0–44)
AST: 17 U/L (ref 15–41)
Albumin: 2.6 g/dL — ABNORMAL LOW (ref 3.5–5.0)
Alkaline Phosphatase: 118 U/L (ref 38–126)
Anion gap: 13 (ref 5–15)
BUN: 70 mg/dL — ABNORMAL HIGH (ref 6–20)
CO2: 23 mmol/L (ref 22–32)
Calcium: 9.3 mg/dL (ref 8.9–10.3)
Chloride: 102 mmol/L (ref 98–111)
Creatinine, Ser: 8.34 mg/dL — ABNORMAL HIGH (ref 0.61–1.24)
GFR, Estimated: 8 mL/min — ABNORMAL LOW (ref >60)
Glucose, Bld: 83 mg/dL (ref 70–99)
Potassium: 3.5 mmol/L (ref 3.5–5.1)
Sodium: 138 mmol/L (ref 135–145)
Total Bilirubin: 0.6 mg/dL (ref 0.3–1.2)
Total Protein: 7 g/dL (ref 6.5–8.1)

## 2020-11-19 LAB — CULTURE, BLOOD (ROUTINE X 2): Culture: NO GROWTH

## 2020-11-19 LAB — PHOSPHORUS: Phosphorus: 7.4 mg/dL — ABNORMAL HIGH (ref 2.5–4.6)

## 2020-11-19 LAB — C-REACTIVE PROTEIN: CRP: 12.7 mg/dL — ABNORMAL HIGH (ref <1.0)

## 2020-11-19 LAB — MAGNESIUM: Magnesium: 1.9 mg/dL (ref 1.7–2.4)

## 2020-11-19 LAB — SEDIMENTATION RATE: Sed Rate: >140 mm/hr — ABNORMAL HIGH (ref 0–16)

## 2020-11-19 NOTE — Progress Notes (Signed)
Patient ID: Micheal Bean, male   DOB: May 27, 1985, 35 y.o.   MRN: [id number] [facility] Progress Note   Assessment/ Plan:   1.  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  He was started on hemodialysis yesterday and will get a second treatment today via his right IJ [facility]; he intends to transition to peritoneal dialysis to maintain independence/mobility as an outpatient.  Process initiated for outpatient dialysis unit placement. 2.  Anemia: Secondary to chronic kidney disease/iron deficiency.  Status post intravenous iron and started on ESA. 3.  Metabolic acidosis: Status post initial management with sodium bicarbonate drip and transition to oral sodium bicarbonate-we will discontinue this now that he is on dialysis. 4.  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  Awaiting possible aspiration of joint/bone by IR for diagnosis. 5.  Chronic kidney disease/metabolic bone disease: Transitioned from sevelamer to lanthanum yesterday with labs showing persistent hyperphosphatemia.  Subjective:   Denies any problems with hemodialysis yesterday.  Affect appears flat and we discussed pursuing outpatient dialysis unit placement to allow discharge.   Objective:   BP 132/74 (BP Location: Left Arm)   Pulse 79   Temp 98.1 F (36.7 C) (Oral)   Resp 18   Ht '6\' 3"'$  (1.905 m)   Wt 86.2 kg   SpO2 99%   BMI 23.75 kg/m   Intake/Output Summary (Last 24 hours) at 11/19/2020 0917 Last data filed at 11/18/2020 2302 Gross per 24 hour  Intake 240 ml  Output 0 ml  Net 240 ml   Weight change: 0 kg  Physical Exam: Gen: Comfortably resting  in bed, watching television.  No family at bedside. CVS: Pulse regular rhythm, normal rate, S1 and S2 normal.  Right IJ TDC in place Resp: Clear to auscultation bilaterally, no rales/rhonchi Abd: Soft, flat, nontender, bowel sounds normal Ext: No lower extremity edema  Imaging: MR STERNUM WO CONTRAST  Result Date: 11/18/2020 CLINICAL DATA:  Abnormal right sternoclavicular joint on CT. EXAM: MR CHEST WITHOUT CONTRAST TECHNIQUE: Multiplanar, multisequence MR imaging of the sternum was performed. No intravenous contrast was administered. COMPARISON:  CT chest dated November 13, 2020. FINDINGS: Bones/Joint/Cartilage Fairly symmetric mild marrow edema in both clavicular heads with mild surrounding soft tissue swelling. No fracture or dislocation. Small bilateral sternoclavicular joint effusions. Ligaments Sternoclavicular ligaments are intact. Muscles and Tendons Intact.  Mild muscle edema around the right sternoclavicular joint. Soft tissue No fluid collection or hematoma.  No soft tissue mass. IMPRESSION: 1. Fairly symmetric marrow edema in both clavicular heads with mild surrounding soft tissue swelling. While nonspecific, the relatively symmetric appearance argues against infection and would more favor an inflammatory or crystal arthropathy. However, osteomyelitis of the right clavicular head remains in the differential given unilateral erosive appearance on CT. No abscess. Electronically Signed   By: Titus Dubin M.D.   On: 11/18/2020 09:56   IR Fluoro Guide CV Line Right  Result Date: 11/18/2020 INDICATION: End-stage renal disease EXAM: Ultrasound fluoroscopy guided tunneled IJ hemodialysis catheter placement MEDICATIONS: Ancef 2 gm IV; The antibiotic was administered within an appropriate time interval prior to skin puncture. ANESTHESIA/SEDATION: Moderate (conscious) sedation was employed during this procedure. A total of Versed 2 mg and Fentanyl 100 mcg was administered intravenously. Moderate  Sedation Time: 9 minutes. The patient's level of consciousness and vital signs were monitored continuously by radiology nursing throughout the procedure under my direct supervision. FLUOROSCOPY TIME:  Fluoroscopy Time: 0 minutes 48 seconds (3 mGy). COMPLICATIONS: None immediate. PROCEDURE: Informed written consent was obtained from the patient after a thorough discussion of the procedural risks, benefits and alternatives. All questions were addressed. Maximal Sterile Barrier Technique was utilized including caps, mask, sterile gowns, sterile gloves, sterile drape, hand hygiene and skin antiseptic. A timeout was performed prior to the initiation of the procedure. The right internal jugular vein was evaluated with ultrasound and shown to be patent. A permanent ultrasound image was obtained and placed in the patient's medical record. Using sterile gel and a sterile probe cover, the right internal jugular vein was entered with a 21 ga needle during real time ultrasound guidance. 0.018 inch guidewire placed and 21 ga needle exchanged for transitional dilator set. Utilizing fluoroscopy, 0.035 inch guidewire advanced through the needle without difficulty. Seriel dilation was performed and peel-away sheath was placed. Attention then turned to the right anterior upper chest. Following local lidocaine administration, the hemodialysis catheter was tunneled from the chest wall to the venotomy site. The catheter was inserted through the peel-away sheath. The tip of the catheter was positioned within the right atrium using fluoroscopic guidance. All lumens of the catheter aspirated and flushed well. The dialysis lumens were locked with Heparin. The catheter was secured to the skin with suture. The insertion site was covered with a Biopatch and sterile dressing. IMPRESSION: 19 cm tunneled right IJ hemodialysis catheter ready for use. Electronically Signed   By: Micheal Bean M.D.   On: 11/18/2020 15:42   IR US Guide Vasc Access  Right  Result Date: 11/18/2020 INDICATION: End-stage renal disease EXAM: Ultrasound fluoroscopy guided tunneled IJ hemodialysis catheter placement MEDICATIONS: Ancef 2 gm IV; The antibiotic was administered within an appropriate time interval prior to skin puncture. ANESTHESIA/SEDATION: Moderate (conscious) sedation was employed during this procedure. A total of Versed 2 mg and Fentanyl 100 mcg was administered intravenously. Moderate Sedation Time: 9 minutes. The patient's level of consciousness and vital signs were monitored continuously by radiology nursing throughout the procedure under my direct supervision. FLUOROSCOPY TIME:  Fluoroscopy Time: 0 minutes 48 seconds (3 mGy). COMPLICATIONS: None immediate. PROCEDURE: Informed written consent was obtained from the patient after a thorough discussion of the procedural risks, benefits and alternatives. All questions were addressed. Maximal Sterile Barrier Technique was utilized including caps, mask, sterile gowns, sterile gloves, sterile drape, hand hygiene and skin antiseptic. A timeout was performed prior to the initiation of the procedure. The right internal jugular vein was evaluated with ultrasound and shown to be patent. A permanent ultrasound image was obtained and placed in the patient's medical record. Using sterile gel and a sterile probe cover, the right internal jugular vein was entered with a 21 ga needle during real time ultrasound guidance. 0.018 inch guidewire placed and 21 ga needle exchanged for transitional dilator set. Utilizing fluoroscopy, 0.035 inch guidewire advanced through the needle without difficulty. Seriel dilation was performed and peel-away sheath was placed. Attention then turned to the right anterior upper chest. Following local lidocaine administration, the hemodialysis catheter was tunneled from the chest wall to the venotomy site. The catheter was inserted through the peel-away sheath. The tip of the catheter was positioned  within the right atrium using fluoroscopic guidance. All lumens of the catheter aspirated and flushed well. The dialysis lumens were locked with Heparin. The  catheter was secured to the skin with suture. The insertion site was covered with a Biopatch and sterile dressing. IMPRESSION: 19 cm tunneled right IJ hemodialysis catheter ready for use. Electronically Signed   By: Micheal Bean M.D.   On: 11/18/2020 15:42    Labs: BMET Recent Labs  Lab 11/13/20 2143 11/14/20 0000 11/14/20 0500 11/14/20 2326 11/15/20 0636 11/15/20 1520 11/16/20 0639 11/17/20 0341 11/18/20 0953 11/19/20 0319  NA  --    < > 134* 134* 136 135 137 136 138 138  K  --    < > 4.0 2.9* 2.9* 3.4* 3.7 3.6 3.7 3.5  CL  --   --  103 99 101 99 102 101 102 102  CO2  --   --  11* 15* 16* 17* 16* 18* 17* 23  GLUCOSE  --   --  101* 96 91 88 86 83 83 83  BUN  --   --  117* 119* 118* 119* 117* 113* 115* 70*  CREATININE  --   --  12.46* 11.88* 11.81* 11.37* 11.90* 11.52* 11.87* 8.34*  CALCIUM  --   --  7.8* 7.7* 8.0* 8.1* 9.2 9.0 9.2 9.3  PHOS 8.8*  --  9.7*  --  11.8*  --  11.3* 10.2* 9.5* 7.4*   < > = values in this interval not displayed.   CBC Recent Labs  Lab 11/15/20 0636 11/16/20 0639 11/17/20 0341 11/18/20 0953 11/19/20 0319  WBC 23.7* 19.8* 16.3* 12.1* 11.2*  NEUTROABS 20.3* 16.5* 13.7*  --  8.7*  HGB 8.6* 8.8* 8.7* 8.6* 9.0*  HCT 25.1* 26.2* 26.1* 26.3* 27.7*  MCV 86.3 85.9 86.1 88.6 88.2  PLT 257 269 275 274 286    Medications:     Chlorhexidine Gluconate Cloth  6 each Topical Q0600   Chlorhexidine Gluconate Cloth  6 each Topical Q0600   darbepoetin (ARANESP) injection - NON-DIALYSIS  40 mcg Subcutaneous Q Sun-1800   feeding supplement (NEPRO CARB STEADY)  237 mL Oral Q24H   heparin sodium (porcine)       lanthanum  1,000 mg Oral TID WC   multivitamin  1 tablet Oral QHS   sodium bicarbonate  650 mg Oral TID   sodium chloride flush  3 mL Intravenous Q12H   vitamin B-12  1,000 mcg Oral Daily   Elmarie Shiley, MD 11/19/2020, 9:17 AM

## 2020-11-19 NOTE — Progress Notes (Signed)
Out Patient Arrangements:  Have been requested to arrange out pt HD for pt. Have submitted medical records to [facility] admissions. Please advise of d/c date.   Linus Orn [facility] [phone]

## 2020-11-19 NOTE — Progress Notes (Addendum)
PROGRESS NOTE    Micheal Bean  NKN:[id number] DOB: 04-24-1985 DOA: 11/13/2020 PCP: Patient, No Pcp Per (Inactive)    Brief Narrative: This 35 years old male with PMH significant for hx. of right nephrectomy as an infant presented with constellation of intermittent symptoms including headache, lightheadedness and chest tightness for 3 to 4 days.  He has tried some fluids at home which seemed to help a little,  he has also tried a dose of his mother's amlodipine but did not seem to help.  He does reports losing 20 pounds in last 2 months. He was seen in urgent care and was sent to the ED for further evaluation.  Patient was admitted for AKI on CKD,  found to have left-sided hydronephrosis with possible UTI, severe anemia and leukocytosis.  Patient started on hemodialysis.  Assessment & Plan:   Principal Problem:   Acute renal failure (ARF) (HCC) Active Problems:   Acute lower UTI   Leukocytosis   Bone lesion   AKI (acute kidney injury) ([place])   Osteomyelitis (HCC)   AKI on CKD stage III: Progression of CKD stage III now end-stage renal disease requiring hemodialysis. Most recent creatinine was 2.0 in 2014 unclear what his recent baseline may have been. 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 He is found to have severe hydronephrosis of the remaining left kidney. Urology consulted, Foley inserted.  Patient may need cystoscopy/retrograde study if creatinine not improving Nephrology consulted,  Patient underwent tunneled hemodialysis catheter on 11/18/20 followed by hemodialysis SPEP, UPEP, free light chains - elevated kappa/lamda light chain - may need to discuss with heme, follow SPEP - monoclonal protein not apparent Patient underwent hemodialysis on 8/16, next dialysis 8/17. Patient is interested in outpatient peritoneal dialysis  to maintain independence /mobility as an outpatient.  Soft tissue thickening around right supraclavicular  joint: Leukocytosis > Improving: There was a concern for UTI, urine culture no growth. CT chest showed  heterogeneous lucency involving head of right clavicle concerning for infection/osteomyelitis. MRI [place] joint showed  fairly symmetric marrow edema in both clavicular heads with mild surrounding soft tissue swelling (argues against infection, favors inflammatory or crystal arthropathy) Completed ceftriaxone for UTI.  Blood cultures no growth to date. ID recommended holding off on antibiotics at this point. Ctsx consulted for joint aspiration to rule out crystal induced arthropathy.  Elevated Inflammatory Markers ESR >140, CRP  Follow ANA, anti CCP, RF  Normocytic normochromic anemia: Could be multifactorial due to B12 deficiency,  anemia of chronic disease and iron deficiency. Found to have significant anemia 6.8, s/p 3 units PRBC Continue B12 and folate supplementation, iron supplementation. No history of black stools. Hb stable at 9.0   Elevated D Dimer Suspect related to inflammation. Negative LE [place] for DVT Consider VQ scan, but no evidence of hypoxia, denies CP - suspect dimer related to other systemic issues, but low threshold for further w/u.    Family requested transfer to [place] earlier in hospitalization.  [facility] declined given family request.  Consider if medical need going further.  He can also follow outpatient as desired.   DVT prophylaxis: Heparin Code Status: Full code. Family Communication: Wife at bed side. Disposition Plan:    Status is: Inpatient  Remains inpatient appropriate because:Inpatient level of care appropriate due to severity of illness  Dispo: The patient is from: Home              Anticipated d/c is to: Home  Patient currently is not medically stable to d/c.   Difficult to place patient No  Consultants:  Nephrology ID Ctsx.  Procedures:  HD, TDC Antimicrobials:   Anti-infectives (From admission, onward)    Start     Dose/Rate  Route Frequency Ordered Stop   11/18/20 1522  ceFAZolin (ANCEF) IVPB 2g/100 mL premix        over 30 Minutes Intravenous Continuous PRN 11/18/20 1532 11/18/20 1522   11/18/20 1512  ceFAZolin (ANCEF) 2-4 GM/100ML-% IVPB       Note to Pharmacy: [name]   : cabinet override      11/18/20 1512 11/19/20 0329   11/18/20 0000  ceFAZolin (ANCEF) IVPB 2g/100 mL premix  Status:  Discontinued        2 g 200 mL/hr over 30 Minutes Intravenous To Radiology 11/17/20 1014 11/17/20 1014   11/17/20 0000  ceFAZolin (ANCEF) IVPB 2g/100 mL premix  Status:  Discontinued        2 g 200 mL/hr over 30 Minutes Intravenous To Radiology 11/16/20 1400 11/17/20 0906   11/14/20 2100  DAPTOmycin (CUBICIN) 700 mg in sodium chloride 0.9 % IVPB  Status:  Discontinued        8 mg/kg  85.2 kg (Adjusted) 128 mL/hr over 30 Minutes Intravenous Every 48 hours 11/14/20 2006 11/17/20 1127   11/14/20 2045  cefTRIAXone (ROCEPHIN) 2 g in sodium chloride 0.9 % 100 mL IVPB  Status:  Discontinued        2 g 200 mL/hr over 30 Minutes Intravenous Every 24 hours 11/14/20 1948 11/17/20 1127   11/13/20 2230  cefTRIAXone (ROCEPHIN) 1 g in sodium chloride 0.9 % 100 mL IVPB  Status:  Discontinued        1 g 200 mL/hr over 30 Minutes Intravenous Every 24 hours 11/13/20 2223 11/14/20 1948        Subjective: Patient was seen and examined at bedside.  Overnight events noted.   He reports feeling much better,  denies any pain. He is getting hemodialysis on his schedule. Right supraclavicular joint swelling, nontender.  Objective: Vitals:   11/18/20 2230 11/18/20 2259 11/19/20 0523 11/19/20 1104  BP: 134/85 131/88 132/74 125/88  Pulse: 70 63 79 79  Resp: $Remo'18 18 18 16  'CVnaI$ Temp: 98.4 F (36.9 C) 98.4 F (36.9 C) 98.1 F (36.7 C) 98.9 F (37.2 C)  TempSrc:  Oral Oral Oral  SpO2: 100% 100% 99% 98%  Weight: 86.2 kg     Height:        Intake/Output Summary (Last 24 hours) at 11/19/2020 1139 Last data filed at 11/18/2020 2302 Gross  per 24 hour  Intake 240 ml  Output 0 ml  Net 240 ml   Filed Weights   11/17/20 2055 11/18/20 2015 11/18/20 2230  Weight: 86.2 kg 86.2 kg 86.2 kg    Examination:  General exam: Appears calm and comfortable, not in any acute distress.  Right SCJ non tender, mild soft tissue swelling. Respiratory system: Clear to auscultation, respiratory effort normal, RR 15 Cardiovascular system: S1 & S2 heard, RRR. No JVD, murmurs, rubs, gallops or clicks. No pedal edema. Gastrointestinal system: Abdomen is nondistended, soft and nontender. No organomegaly or masses felt. Normal bowel sounds heard. Central nervous system: Alert and oriented X 3. No focal neurological deficits. Extremities: No edema, no cyanosis, no clubbing. Skin: No rashes, lesions or ulcers Psychiatry: Judgement and insight appear normal. Mood & affect appropriate.     Data Reviewed: I have personally reviewed following labs and  imaging studies  CBC: Recent Labs  Lab 11/13/20 1951 11/14/20 0000 11/15/20 0636 11/16/20 0639 11/17/20 0341 11/18/20 0953 11/19/20 0319  WBC 29.9*   < > 23.7* 19.8* 16.3* 12.1* 11.2*  NEUTROABS 28.3*  --  20.3* 16.5* 13.7*  --  8.7*  HGB 6.0*   < > 8.6* 8.8* 8.7* 8.6* 9.0*  HCT 19.9*   < > 25.1* 26.2* 26.1* 26.3* 27.7*  MCV 93.9   < > 86.3 85.9 86.1 88.6 88.2  PLT 324   < > 257 269 275 274 286   < > = values in this interval not displayed.   Basic Metabolic Panel: Recent Labs  Lab 11/13/20 2143 11/14/20 0000 11/15/20 0636 11/15/20 1520 11/16/20 0639 11/17/20 0341 11/18/20 0953 11/19/20 0319  NA  --    < > 136 135 137 136 138 138  K  --    < > 2.9* 3.4* 3.7 3.6 3.7 3.5  CL  --    < > 101 99 102 101 102 102  CO2  --    < > 16* 17* 16* 18* 17* 23  GLUCOSE  --    < > 91 88 86 83 83 83  BUN  --    < > 118* 119* 117* 113* 115* 70*  CREATININE  --    < > 11.81* 11.37* 11.90* 11.52* 11.87* 8.34*  CALCIUM  --    < > 8.0* 8.1* 9.2 9.0 9.2 9.3  MG 1.4*  --  1.5*  --  1.9  --  1.9 1.9   PHOS 8.8*   < > 11.8*  --  11.3* 10.2* 9.5* 7.4*   < > = values in this interval not displayed.   GFR: Estimated Creatinine Clearance: 15.2 mL/min (A) (by C-G formula based on SCr of 8.34 mg/dL (H)). Liver Function Tests: Recent Labs  Lab 11/13/20 1951 11/14/20 0500 11/15/20 0636 11/16/20 0639 11/17/20 0341 11/19/20 0319  AST 13*  --  8* 8* 10* 17  ALT 9  --  $R'9 9 8 10  'At$ ALKPHOS 112  --  100 107 106 118  BILITOT 0.2*  --  0.7 0.9 0.6 0.6  PROT 7.2  --  6.5 6.8 6.8 7.0  ALBUMIN 2.7* 2.6* 2.4* 2.4* 2.3*  2.4* 2.6*   No results for input(s): LIPASE, AMYLASE in the last 168 hours. No results for input(s): AMMONIA in the last 168 hours. Coagulation Profile: Recent Labs  Lab 11/13/20 2143  INR 1.5*   Cardiac Enzymes: Recent Labs  Lab 11/15/20 0636  CKTOTAL 38*   BNP (last 3 results) No results for input(s): PROBNP in the last 8760 hours. HbA1C: No results for input(s): HGBA1C in the last 72 hours. CBG: No results for input(s): GLUCAP in the last 168 hours. Lipid Profile: No results for input(s): CHOL, HDL, LDLCALC, TRIG, CHOLHDL, LDLDIRECT in the last 72 hours. Thyroid Function Tests: No results for input(s): TSH, T4TOTAL, FREET4, T3FREE, THYROIDAB in the last 72 hours. Anemia Panel: No results for input(s): VITAMINB12, FOLATE, FERRITIN, TIBC, IRON, RETICCTPCT in the last 72 hours. Sepsis Labs: No results for input(s): PROCALCITON, LATICACIDVEN in the last 168 hours.  Recent Results (from the past 240 hour(s))  Resp Panel by RT-PCR (Flu A&B, Covid) Nasopharyngeal Swab     Status: None   Collection Time: 11/13/20  7:30 PM   Specimen: Nasopharyngeal Swab; Nasopharyngeal(NP) swabs in vial transport medium  Result Value Ref Range Status   SARS Coronavirus 2 by RT PCR NEGATIVE NEGATIVE  Final    Comment: (NOTE) SARS-CoV-2 target nucleic acids are NOT DETECTED.  The SARS-CoV-2 RNA is generally detectable in upper respiratory specimens during the acute phase of  infection. The lowest concentration of SARS-CoV-2 viral copies this assay can detect is 138 copies/mL. A negative result does not preclude SARS-Cov-2 infection and should not be used as the sole basis for treatment or other patient management decisions. A negative result may occur with  improper specimen collection/handling, submission of specimen other than nasopharyngeal swab, presence of viral mutation(s) within the areas targeted by this assay, and inadequate number of viral copies(<138 copies/mL). A negative result must be combined with clinical observations, patient history, and epidemiological information. The expected result is Negative.  Fact Sheet for Patients:  EntrepreneurPulse.com.au  Fact Sheet for Healthcare Providers:  IncredibleEmployment.be  This test is no t yet approved or cleared by the [place] FDA and  has been authorized for detection and/or diagnosis of SARS-CoV-2 by FDA under an Emergency Use Authorization (EUA). This EUA will remain  in effect (meaning this test can be used) for the duration of the COVID-19 declaration under Section 564(b)(1) of the Act, 21 U.S.C.section 360bbb-3(b)(1), unless the authorization is terminated  or revoked sooner.       Influenza A by PCR NEGATIVE NEGATIVE Final   Influenza B by PCR NEGATIVE NEGATIVE Final    Comment: (NOTE) The Xpert Xpress SARS-CoV-2/FLU/RSV plus assay is intended as an aid in the diagnosis of influenza from Nasopharyngeal swab specimens and should not be used as a sole basis for treatment. Nasal washings and aspirates are unacceptable for Xpert Xpress SARS-CoV-2/FLU/RSV testing.  Fact Sheet for Patients: EntrepreneurPulse.com.au  Fact Sheet for Healthcare Providers: IncredibleEmployment.be  This test is not yet approved or cleared by the [place] FDA and has been authorized for detection and/or diagnosis of SARS-CoV-2  by FDA under an Emergency Use Authorization (EUA). This EUA will remain in effect (meaning this test can be used) for the duration of the COVID-19 declaration under Section 564(b)(1) of the Act, 21 U.S.C. section 360bbb-3(b)(1), unless the authorization is terminated or revoked.  Performed at [facility], [place] [address], [place], [place] [address]   Urine Culture     Status: None   Collection Time: 11/13/20 10:23 PM   Specimen: Urine, Clean Catch  Result Value Ref Range Status   Specimen Description URINE, CLEAN CATCH  Final   Special Requests NONE  Final   Culture   Final    NO GROWTH Performed at [facility], [place] [address], [place], [place] [address]    Report Status 11/15/2020 FINAL  Final  Culture, blood (routine x 2)     Status: None   Collection Time: 11/14/20  9:19 AM   Specimen: BLOOD RIGHT FOREARM  Result Value Ref Range Status   Specimen Description BLOOD RIGHT FOREARM  Final   Special Requests   Final    BOTTLES DRAWN AEROBIC AND ANAEROBIC Blood Culture results may not be optimal due to an excessive volume of blood received in culture bottles   Culture   Final    NO GROWTH 5 DAYS Performed at [facility], [place] [address], [place],  [address]    Report Status 11/19/2020 FINAL  Final  Culture, blood (Routine X 2) w Reflex to ID Panel     Status: None (Preliminary result)   Collection Time: 11/14/20 11:20 PM   Specimen: BLOOD LEFT FOREARM  Result Value Ref Range Status  Specimen Description BLOOD LEFT FOREARM  Final   Special Requests   Final    BOTTLES DRAWN AEROBIC ONLY Blood Culture adequate volume   Culture   Final    NO GROWTH 4 DAYS Performed at [facility], [address] [place], [place] [address]    Report Status PENDING  Incomplete    Radiology Studies: MR STERNUM WO CONTRAST  Result Date: 11/18/2020 CLINICAL DATA:  Abnormal right sternoclavicular joint on CT. EXAM: MR CHEST WITHOUT CONTRAST TECHNIQUE:  Multiplanar, multisequence MR imaging of the sternum was performed. No intravenous contrast was administered. COMPARISON:  CT chest dated November 13, 2020. FINDINGS: Bones/Joint/Cartilage Fairly symmetric mild marrow edema in both clavicular heads with mild surrounding soft tissue swelling. No fracture or dislocation. Small bilateral sternoclavicular joint effusions. Ligaments Sternoclavicular ligaments are intact. Muscles and Tendons Intact.  Mild muscle edema around the right sternoclavicular joint. Soft tissue No fluid collection or hematoma.  No soft tissue mass. IMPRESSION: 1. Fairly symmetric marrow edema in both clavicular heads with mild surrounding soft tissue swelling. While nonspecific, the relatively symmetric appearance argues against infection and would more favor an inflammatory or crystal arthropathy. However, osteomyelitis of the right clavicular head remains in the differential given unilateral erosive appearance on CT. No abscess. Electronically Signed   By: Titus Dubin M.D.   On: 11/18/2020 09:56   IR Fluoro Guide CV Line Right  Result Date: 11/18/2020 INDICATION: End-stage renal disease EXAM: Ultrasound fluoroscopy guided tunneled IJ hemodialysis catheter placement MEDICATIONS: Ancef 2 gm IV; The antibiotic was administered within an appropriate time interval prior to skin puncture. ANESTHESIA/SEDATION: Moderate (conscious) sedation was employed during this procedure. A total of Versed 2 mg and Fentanyl 100 mcg was administered intravenously. Moderate Sedation Time: 9 minutes. The patient's level of consciousness and vital signs were monitored continuously by radiology nursing throughout the procedure under my direct supervision. FLUOROSCOPY TIME:  Fluoroscopy Time: 0 minutes 48 seconds (3 mGy). COMPLICATIONS: None immediate. PROCEDURE: Informed written consent was obtained from the patient after a thorough discussion of the procedural risks, benefits and alternatives. All questions were  addressed. Maximal Sterile Barrier Technique was utilized including caps, mask, sterile gowns, sterile gloves, sterile drape, hand hygiene and skin antiseptic. A timeout was performed prior to the initiation of the procedure. The right internal jugular vein was evaluated with ultrasound and shown to be patent. A permanent ultrasound image was obtained and placed in the patient's medical record. Using sterile gel and a sterile probe cover, the right internal jugular vein was entered with a 21 ga needle during real time ultrasound guidance. 0.018 inch guidewire placed and 21 ga needle exchanged for transitional dilator set. Utilizing fluoroscopy, 0.035 inch guidewire advanced through the needle without difficulty. Seriel dilation was performed and peel-away sheath was placed. Attention then turned to the right anterior upper chest. Following local lidocaine administration, the hemodialysis catheter was tunneled from the chest wall to the venotomy site. The catheter was inserted through the peel-away sheath. The tip of the catheter was positioned within the right atrium using fluoroscopic guidance. All lumens of the catheter aspirated and flushed well. The dialysis lumens were locked with Heparin. The catheter was secured to the skin with suture. The insertion site was covered with a Biopatch and sterile dressing. IMPRESSION: 19 cm tunneled right IJ hemodialysis catheter ready for use. Electronically Signed   By: Miachel Roux M.D.   On: 11/18/2020 15:42   IR US Guide Vasc Access Right  Result Date: 11/18/2020  INDICATION: End-stage renal disease EXAM: Ultrasound fluoroscopy guided tunneled IJ hemodialysis catheter placement MEDICATIONS: Ancef 2 gm IV; The antibiotic was administered within an appropriate time interval prior to skin puncture. ANESTHESIA/SEDATION: Moderate (conscious) sedation was employed during this procedure. A total of Versed 2 mg and Fentanyl 100 mcg was administered intravenously. Moderate  Sedation Time: 9 minutes. The patient's level of consciousness and vital signs were monitored continuously by radiology nursing throughout the procedure under my direct supervision. FLUOROSCOPY TIME:  Fluoroscopy Time: 0 minutes 48 seconds (3 mGy). COMPLICATIONS: None immediate. PROCEDURE: Informed written consent was obtained from the patient after a thorough discussion of the procedural risks, benefits and alternatives. All questions were addressed. Maximal Sterile Barrier Technique was utilized including caps, mask, sterile gowns, sterile gloves, sterile drape, hand hygiene and skin antiseptic. A timeout was performed prior to the initiation of the procedure. The right internal jugular vein was evaluated with ultrasound and shown to be patent. A permanent ultrasound image was obtained and placed in the patient's medical record. Using sterile gel and a sterile probe cover, the right internal jugular vein was entered with a 21 ga needle during real time ultrasound guidance. 0.018 inch guidewire placed and 21 ga needle exchanged for transitional dilator set. Utilizing fluoroscopy, 0.035 inch guidewire advanced through the needle without difficulty. Seriel dilation was performed and peel-away sheath was placed. Attention then turned to the right anterior upper chest. Following local lidocaine administration, the hemodialysis catheter was tunneled from the chest wall to the venotomy site. The catheter was inserted through the peel-away sheath. The tip of the catheter was positioned within the right atrium using fluoroscopic guidance. All lumens of the catheter aspirated and flushed well. The dialysis lumens were locked with Heparin. The catheter was secured to the skin with suture. The insertion site was covered with a Biopatch and sterile dressing. IMPRESSION: 19 cm tunneled right IJ hemodialysis catheter ready for use. Electronically Signed   By: Miachel Roux M.D.   On: 11/18/2020 15:42     Scheduled Meds:   Chlorhexidine Gluconate Cloth  6 each Topical Q0600   Chlorhexidine Gluconate Cloth  6 each Topical Q0600   darbepoetin (ARANESP) injection - NON-DIALYSIS  40 mcg Subcutaneous Q Sun-1800   feeding supplement (NEPRO CARB STEADY)  237 mL Oral Q24H   lanthanum  1,000 mg Oral TID WC   multivitamin  1 tablet Oral QHS   sodium chloride flush  3 mL Intravenous Q12H   vitamin B-12  1,000 mcg Oral Daily   Continuous Infusions:  sodium chloride     sodium chloride     sodium chloride       LOS: 5 days    Time spent: 35 mins.    Shawna Clamp, MD [facility]   If 7PM-7AM, please contact night-coverage

## 2020-11-20 ENCOUNTER — Inpatient Hospital Stay (HOSPITAL_COMMUNITY): Payer: Medicaid Other

## 2020-11-20 LAB — CULTURE, BLOOD (ROUTINE X 2)
Culture: NO GROWTH
Special Requests: ADEQUATE

## 2020-11-20 LAB — RENAL FUNCTION PANEL
Albumin: 2.6 g/dL — ABNORMAL LOW (ref 3.5–5.0)
Anion gap: 14 (ref 5–15)
BUN: 73 mg/dL — ABNORMAL HIGH (ref 6–20)
CO2: 21 mmol/L — ABNORMAL LOW (ref 22–32)
Calcium: 9.2 mg/dL (ref 8.9–10.3)
Chloride: 101 mmol/L (ref 98–111)
Creatinine, Ser: 9.82 mg/dL — ABNORMAL HIGH (ref 0.61–1.24)
GFR, Estimated: 7 mL/min — ABNORMAL LOW (ref >60)
Glucose, Bld: 91 mg/dL (ref 70–99)
Phosphorus: 8.6 mg/dL — ABNORMAL HIGH (ref 2.5–4.6)
Potassium: 3.6 mmol/L (ref 3.5–5.1)
Sodium: 136 mmol/L (ref 135–145)

## 2020-11-20 LAB — CBC
HCT: 28.7 % — ABNORMAL LOW (ref 39.0–52.0)
Hemoglobin: 9.4 g/dL — ABNORMAL LOW (ref 13.0–17.0)
MCH: 29.5 pg (ref 26.0–34.0)
MCHC: 32.8 g/dL (ref 30.0–36.0)
MCV: 90 fL (ref 80.0–100.0)
Platelets: 291 10*3/uL (ref 150–400)
RBC: 3.19 MIL/uL — ABNORMAL LOW (ref 4.22–5.81)
RDW: 13.8 % (ref 11.5–15.5)
WBC: 12.2 10*3/uL — ABNORMAL HIGH (ref 4.0–10.5)
nRBC: 0 % (ref 0.0–0.2)

## 2020-11-20 LAB — RHEUMATOID FACTOR: Rheumatoid fact SerPl-aCnc: 11.8 IU/mL (ref <14.0)

## 2020-11-20 LAB — ANA W/REFLEX IF POSITIVE: Anti Nuclear Antibody (ANA): NEGATIVE

## 2020-11-20 MED ORDER — HEPARIN SODIUM (PORCINE) 1000 UNIT/ML DIALYSIS: 40.0000 [IU]/kg | INTRAMUSCULAR | Status: DC | PRN

## 2020-11-20 MED ORDER — NEPRO/CARBSTEADY PO LIQD
237.0000 mL | Freq: Two times a day (BID) | ORAL | Status: DC
  Administered 2020-11-21 – 2020-11-23 (×3): 237 mL via ORAL

## 2020-11-20 NOTE — Progress Notes (Signed)
Patient ID: Micheal Bean, male   DOB: Sep 22, 1985, 35 y.o.   MRN: [id number] [facility] Progress Note   Assessment/ Plan:   1.  Progression of chronic kidney disease stage V now end-stage renal disease: Likely progression partly from obstruction in the setting of a solitary kidney and renal function without significant recovery after Foley placement.  He was started on hemodialysis and is getting his 2nd treatment today via his right IJ [facility]; he intends to transition to peritoneal dialysis to maintain independence/mobility as an outpatient (will be followed by Dr.Foster).  Process initiated for outpatient dialysis unit placement (TCU). 2.  Anemia: Secondary to chronic kidney disease/iron deficiency.  Status post intravenous iron and started on ESA. 3.  Metabolic acidosis: Status post initial management with sodium bicarbonate drip and transition to oral sodium bicarbonate- now with dialysis. 4.  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  Awaiting possible aspiration of joint/bone by IR for diagnosis. 5.  Chronic kidney disease/metabolic bone disease: Transitioned from sevelamer to lanthanum yesterday with labs showing persistent hyperphosphatemia.  Subjective:   Complains of discomfort from Foley catheter and inquires about going home.   Objective:   BP (!) 161/89 (BP Location: Left Arm)   Pulse 82   Temp 99.2 F (37.3 C)   Resp 20   Ht '6\' 3"'$  (1.905 m)   Wt 78.6 kg   SpO2 98%   BMI 21.66 kg/m   Intake/Output Summary (Last 24 hours) at 11/20/2020 0950 Last data filed at 11/20/2020 0600 Gross per 24 hour  Intake 714 ml  Output 1575 ml  Net -861 ml   Weight change:   Physical Exam: Gen: Comfortably resting in dialysis. CVS: Pulse regular rhythm, normal rate, S1 and S2 normal.  Right IJ TDC connected to HD Resp: Clear to auscultation  bilaterally, no rales/rhonchi Abd: Soft, flat, nontender, bowel sounds normal Ext: No lower extremity edema  Imaging: IR Fluoro Guide CV Line Right  Result Date: 11/18/2020 INDICATION: End-stage renal disease EXAM: Ultrasound fluoroscopy guided tunneled IJ hemodialysis catheter placement MEDICATIONS: Ancef 2 gm IV; The antibiotic was administered within an appropriate time interval prior to skin puncture. ANESTHESIA/SEDATION: Moderate (conscious) sedation was employed during this procedure. A total of Versed 2 mg and Fentanyl 100 mcg was administered intravenously. Moderate Sedation Time: 9 minutes. The patient's level of consciousness and vital signs were monitored continuously by radiology nursing throughout the procedure under my direct supervision. FLUOROSCOPY TIME:  Fluoroscopy Time: 0 minutes 48 seconds (3 mGy). COMPLICATIONS: None immediate. PROCEDURE: Informed written consent was obtained from the patient after a thorough discussion of the procedural risks, benefits and alternatives. All questions were addressed. Maximal Sterile Barrier Technique was utilized including caps, mask, sterile gowns, sterile gloves, sterile drape, hand hygiene and skin antiseptic. A timeout was performed prior to the initiation of the procedure. The right internal jugular vein was evaluated with ultrasound and shown to be patent. A permanent ultrasound image was obtained and placed in the patient's medical record. Using sterile gel and a sterile probe cover, the right internal jugular vein was entered with a 21 ga needle during real time ultrasound guidance. 0.018 inch guidewire placed and 21 ga needle exchanged for transitional dilator set. Utilizing fluoroscopy, 0.035 inch guidewire advanced through the needle without difficulty. Seriel dilation was performed and peel-away sheath was placed. Attention then turned to the right anterior upper chest. Following local lidocaine administration, the hemodialysis catheter was  tunneled from the chest wall to the venotomy site. The catheter was inserted through the peel-away sheath. The tip of the catheter was positioned within the right atrium using fluoroscopic guidance. All lumens of the catheter aspirated and flushed well. The dialysis lumens were locked with Heparin. The catheter was secured to the skin with suture. The insertion site was covered with a Biopatch and sterile dressing. IMPRESSION: 19 cm tunneled right IJ hemodialysis catheter ready for use. Electronically Signed   By: Miachel Roux M.D.   On: 11/18/2020 15:42   IR US Guide Vasc Access Right  Result Date: 11/18/2020 INDICATION: End-stage renal disease EXAM: Ultrasound fluoroscopy guided tunneled IJ hemodialysis catheter placement MEDICATIONS: Ancef 2 gm IV; The antibiotic was administered within an appropriate time interval prior to skin puncture. ANESTHESIA/SEDATION: Moderate (conscious) sedation was employed during this procedure. A total of Versed 2 mg and Fentanyl 100 mcg was administered intravenously. Moderate Sedation Time: 9 minutes. The patient's level of consciousness and vital signs were monitored continuously by radiology nursing throughout the procedure under my direct supervision. FLUOROSCOPY TIME:  Fluoroscopy Time: 0 minutes 48 seconds (3 mGy). COMPLICATIONS: None immediate. PROCEDURE: Informed written consent was obtained from the patient after a thorough discussion of the procedural risks, benefits and alternatives. All questions were addressed. Maximal Sterile Barrier Technique was utilized including caps, mask, sterile gowns, sterile gloves, sterile drape, hand hygiene and skin antiseptic. A timeout was performed prior to the initiation of the procedure. The right internal jugular vein was evaluated with ultrasound and shown to be patent. A permanent ultrasound image was obtained and placed in the patient's medical record. Using sterile gel and a sterile probe cover, the right internal jugular vein  was entered with a 21 ga needle during real time ultrasound guidance. 0.018 inch guidewire placed and 21 ga needle exchanged for transitional dilator set. Utilizing fluoroscopy, 0.035 inch guidewire advanced through the needle without difficulty. Seriel dilation was performed and peel-away sheath was placed. Attention then turned to the right anterior upper chest. Following local lidocaine administration, the hemodialysis catheter was tunneled from the chest wall to the venotomy site. The catheter was inserted through the peel-away sheath. The tip of the catheter was positioned within the right atrium using fluoroscopic guidance. All lumens of the catheter aspirated and flushed well. The dialysis lumens were locked with Heparin. The catheter was secured to the skin with suture. The insertion site was covered with a Biopatch and sterile dressing. IMPRESSION: 19 cm tunneled right IJ hemodialysis catheter ready for use. Electronically Signed   By: Miachel Roux M.D.   On: 11/18/2020 15:42    Labs: BMET Recent Labs  Lab 11/14/20 0500 11/14/20 2326 11/15/20 0636 11/15/20 1520 11/16/20 0639 11/17/20 0341 11/18/20 0953 11/19/20 0319 11/20/20 0717  NA 134*   < > 136 135 137 136 138 138 136  K 4.0   < > 2.9* 3.4* 3.7 3.6 3.7 3.5 3.6  CL 103   < > 101 99 102 101 102 102 101  CO2 11*   < > 16* 17* 16* 18* 17* 23 21*  GLUCOSE 101*   < > 91 88 86 83 83 83 91  BUN 117*   < > 118* 119* 117* 113* 115* 70* 73*  CREATININE 12.46*   < > 11.81* 11.37* 11.90* 11.52* 11.87* 8.34* 9.82*  CALCIUM 7.8*   < > 8.0* 8.1* 9.2 9.0 9.2 9.3 9.2  PHOS 9.7*  --  11.8*  --  11.3* 10.2* 9.5* 7.4*  8.6*   < > = values in this interval not displayed.   CBC Recent Labs  Lab 11/15/20 0636 11/16/20 0639 11/17/20 0341 11/18/20 0953 11/19/20 0319 11/20/20 0717  WBC 23.7* 19.8* 16.3* 12.1* 11.2* 12.2*  NEUTROABS 20.3* 16.5* 13.7*  --  8.7*  --   HGB 8.6* 8.8* 8.7* 8.6* 9.0* 9.4*  HCT 25.1* 26.2* 26.1* 26.3* 27.7* 28.7*   MCV 86.3 85.9 86.1 88.6 88.2 90.0  PLT 257 269 275 274 286 291    Medications:     Chlorhexidine Gluconate Cloth  6 each Topical Q0600   Chlorhexidine Gluconate Cloth  6 each Topical Q0600   darbepoetin (ARANESP) injection - NON-DIALYSIS  40 mcg Subcutaneous Q Sun-1800   feeding supplement (NEPRO CARB STEADY)  237 mL Oral Q24H   lanthanum  1,000 mg Oral TID WC   multivitamin  1 tablet Oral QHS   sodium chloride flush  3 mL Intravenous Q12H   vitamin B-12  1,000 mcg Oral Daily   Elmarie Shiley, MD 11/20/2020, 9:50 AM

## 2020-11-20 NOTE — Progress Notes (Signed)
PROGRESS NOTE    KAYON DOZIER  YTK:[id number] DOB: 28-Jun-1985 DOA: 11/13/2020 PCP: Patient, No Pcp Per (Inactive)    Brief Narrative: This 35 years old male with PMH significant for hx. of right nephrectomy as an infant presented with constellation of intermittent symptoms including headache, lightheadedness and chest tightness for 3 to 4 days.  He has tried some fluids at home which seemed to help a little,  he has also tried a dose of his mother's amlodipine but did not seem to help.  He does reports losing 20 pounds in last 2 months. He was seen in urgent care and was sent to the ED for further evaluation.  Patient was admitted for AKI on CKD,  found to have left-sided hydronephrosis with possible UTI, severe anemia and leukocytosis.  Patient started on hemodialysis.  Plan is to arrange outpatient hemodialysis set up so patient can be discharged.  Assessment & Plan:   Principal Problem:   Acute renal failure (ARF) (HCC) Active Problems:   Acute lower UTI   Leukocytosis   Bone lesion   AKI (acute kidney injury) ([place])   Osteomyelitis (HCC)   AKI on CKD stage III: Progression of CKD stage III now end-stage renal disease requiring hemodialysis. Most recent creatinine was 2.0 in 2014 unclear what his recent baseline may have been. 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 He is found to have severe hydronephrosis of the remaining left kidney. Urology consulted, Foley inserted.  Patient may need cystoscopy/retrograde study if creatinine not improving Nephrology consulted, no renal recovery ,  Patient underwent tunneled hemodialysis catheter on 11/18/20 followed by hemodialysis. SPEP, UPEP, free light chains - elevated kappa/lamda light chain - may need to discuss with heme, follow SPEP - monoclonal protein not apparent. Patient underwent hemodialysis on 8/16, next dialysis 8/18 Patient is interested in outpatient peritoneal dialysis  to maintain  independence /mobility as an outpatient( He will be followed by Dr. Reeves Dam)  Anion gap metabolic acidosis: Resolved with sodium bicarbonate infusion and now with dialysis.  Soft tissue thickening around right supraclavicular joint: Leukocytosis > Improving: There was a concern for UTI, but urine culture no growth. CT chest showed  heterogeneous lucency involving head of right clavicle concerning for infection/osteomyelitis. MRI [place] joint showed  fairly symmetric marrow edema in both clavicular heads with mild surrounding soft tissue swelling (argues against infection, favors inflammatory or crystal arthropathy) Completed ceftriaxone for UTI.  Blood cultures no growth to date. ID recommended holding off on antibiotics at this point. Ctsx consulted for joint aspiration to rule out crystal induced arthropathy.  Elevated Inflammatory Markers ESR >140, CRP  Follow ANA, anti CCP, RF  Normocytic normochromic anemia: Could be multifactorial due to B12 deficiency,  anemia of chronic disease and iron deficiency. Found to have significant anemia 6.8, s/p 3 units PRBC Continue B12 and folate supplementation, iron supplementation. No history of black stools. Hb stable at 9.0 > 9.4   Elevated D Dimer Suspect related to inflammation. Negative LE [place] for DVT Consider VQ scan, but no evidence of hypoxia, denies CP - suspect dimer related to other systemic issues, but low threshold for further w/u.    Family requested transfer to [facility] earlier in hospitalization.  [facility] declined given family request.  Consider if medical need going further.  He can also follow outpatient as desired.   DVT prophylaxis: Heparin Code Status: Full code. Family Communication: Wife at bed side. Disposition Plan:  Status is: Inpatient  Remains inpatient appropriate because:Inpatient level of care appropriate due to severity of illness  Dispo: The patient is from: Home              Anticipated d/c is to: Home               Patient currently is not medically stable to d/c.   Difficult to place patient No  Consultants:  Nephrology ID Ctsx.  Procedures:  HD, TDC Antimicrobials:   Anti-infectives (From admission, onward)    Start     Dose/Rate Route Frequency Ordered Stop   11/18/20 1522  ceFAZolin (ANCEF) IVPB 2g/100 mL premix        over 30 Minutes Intravenous Continuous PRN 11/18/20 1532 11/18/20 1522   11/18/20 1512  ceFAZolin (ANCEF) 2-4 GM/100ML-% IVPB       Note to Pharmacy: [name]   : cabinet override      11/18/20 1512 11/19/20 0329   11/18/20 0000  ceFAZolin (ANCEF) IVPB 2g/100 mL premix  Status:  Discontinued        2 g 200 mL/hr over 30 Minutes Intravenous To Radiology 11/17/20 1014 11/17/20 1014   11/17/20 0000  ceFAZolin (ANCEF) IVPB 2g/100 mL premix  Status:  Discontinued        2 g 200 mL/hr over 30 Minutes Intravenous To Radiology 11/16/20 1400 11/17/20 0906   11/14/20 2100  DAPTOmycin (CUBICIN) 700 mg in sodium chloride 0.9 % IVPB  Status:  Discontinued        8 mg/kg  85.2 kg (Adjusted) 128 mL/hr over 30 Minutes Intravenous Every 48 hours 11/14/20 2006 11/17/20 1127   11/14/20 2045  cefTRIAXone (ROCEPHIN) 2 g in sodium chloride 0.9 % 100 mL IVPB  Status:  Discontinued        2 g 200 mL/hr over 30 Minutes Intravenous Every 24 hours 11/14/20 1948 11/17/20 1127   11/13/20 2230  cefTRIAXone (ROCEPHIN) 1 g in sodium chloride 0.9 % 100 mL IVPB  Status:  Discontinued        1 g 200 mL/hr over 30 Minutes Intravenous Every 24 hours 11/13/20 2223 11/14/20 1948        Subjective: Patient was seen and examined in hemodialysis suite.  Overnight events noted. He reports feeling improved.  Denies any pain.  He wants to have his Foley removed. Outpatient hemodialysis set up is in process.   Objective: Vitals:   11/20/20 0900 11/20/20 0930 11/20/20 1000 11/20/20 1030  BP: (!) 153/77 (!) 161/89 (!) 107/92 131/75  Pulse: 87 82 67   Resp: 14 20    Temp:      TempSrc:       SpO2:      Weight:      Height:        Intake/Output Summary (Last 24 hours) at 11/20/2020 1044 Last data filed at 11/20/2020 0600 Gross per 24 hour  Intake 714 ml  Output 1575 ml  Net -861 ml   Filed Weights   11/18/20 2015 11/18/20 2230 11/20/20 0840  Weight: 86.2 kg 86.2 kg 78.6 kg    Examination:  General exam: Appears comfortable, not in any acute distress.  Getting dialysis through right IJ catheter. Respiratory system: Clear to auscultation, respiratory effort normal, RR 16 Cardiovascular system: S1-S2 heard, regular rate and rhythm, no murmur  Gastrointestinal system: Abdomen is soft, nontender, nondistended.. No organomegaly or masses felt. Normal bowel sounds heard. Central nervous system: Alert and oriented X 3. No focal neurological deficits.  Extremities: No edema, no cyanosis, no clubbing. Skin: No rashes, lesions or ulcers Psychiatry: Judgement and insight appear normal. Mood & affect appropriate.     Data Reviewed: I have personally reviewed following labs and imaging studies  CBC: Recent Labs  Lab 11/13/20 1951 11/14/20 0000 11/15/20 0636 11/16/20 5537 11/17/20 0341 11/18/20 0953 11/19/20 0319 11/20/20 0717  WBC 29.9*   < > 23.7* 19.8* 16.3* 12.1* 11.2* 12.2*  NEUTROABS 28.3*  --  20.3* 16.5* 13.7*  --  8.7*  --   HGB 6.0*   < > 8.6* 8.8* 8.7* 8.6* 9.0* 9.4*  HCT 19.9*   < > 25.1* 26.2* 26.1* 26.3* 27.7* 28.7*  MCV 93.9   < > 86.3 85.9 86.1 88.6 88.2 90.0  PLT 324   < > 257 269 275 274 286 291   < > = values in this interval not displayed.   Basic Metabolic Panel: Recent Labs  Lab 11/13/20 2143 11/14/20 0000 11/15/20 0636 11/15/20 1520 11/16/20 4827 11/17/20 0341 11/18/20 0953 11/19/20 0319 11/20/20 0717  NA  --    < > 136   < > 137 136 138 138 136  K  --    < > 2.9*   < > 3.7 3.6 3.7 3.5 3.6  CL  --    < > 101   < > 102 101 102 102 101  CO2  --    < > 16*   < > 16* 18* 17* 23 21*  GLUCOSE  --    < > 91   < > 86 83 83 83 91  BUN  --     < > 118*   < > 117* 113* 115* 70* 73*  CREATININE  --    < > 11.81*   < > 11.90* 11.52* 11.87* 8.34* 9.82*  CALCIUM  --    < > 8.0*   < > 9.2 9.0 9.2 9.3 9.2  MG 1.4*  --  1.5*  --  1.9  --  1.9 1.9  --   PHOS 8.8*   < > 11.8*  --  11.3* 10.2* 9.5* 7.4* 8.6*   < > = values in this interval not displayed.   GFR: Estimated Creatinine Clearance: 12 mL/min (A) (by C-G formula based on SCr of 9.82 mg/dL (H)). Liver Function Tests: Recent Labs  Lab 11/13/20 1951 11/14/20 0500 11/15/20 0636 11/16/20 0639 11/17/20 0341 11/19/20 0319 11/20/20 0717  AST 13*  --  8* 8* 10* 17  --   ALT 9  --  _0 --   ALKPHOS 112  --  100 107 106 118  --   BILITOT 0.2*  --  0.7 0.9 0.6 0.6  --   PROT 7.2  --  6.5 6.8 6.8 7.0  --   ALBUMIN 2.7*   < > 2.4* 2.4* 2.3*  2.4* 2.6* 2.6*   < > = values in this interval not displayed.   No results for input(s): LIPASE, AMYLASE in the last 168 hours. No results for input(s): AMMONIA in the last 168 hours. Coagulation Profile: Recent Labs  Lab 11/13/20 2143  INR 1.5*   Cardiac Enzymes: Recent Labs  Lab 11/15/20 0636  CKTOTAL 38*   BNP (last 3 results) No results for input(s): PROBNP in the last 8760 hours. HbA1C: No results for input(s): HGBA1C in the last 72 hours. CBG: No results for input(s): GLUCAP in the last 168 hours. Lipid Profile: No results for input(s): CHOL,  HDL, LDLCALC, TRIG, CHOLHDL, LDLDIRECT in the last 72 hours. Thyroid Function Tests: No results for input(s): TSH, T4TOTAL, FREET4, T3FREE, THYROIDAB in the last 72 hours. Anemia Panel: No results for input(s): VITAMINB12, FOLATE, FERRITIN, TIBC, IRON, RETICCTPCT in the last 72 hours. Sepsis Labs: No results for input(s): PROCALCITON, LATICACIDVEN in the last 168 hours.  Recent Results (from the past 240 hour(s))  Resp Panel by RT-PCR (Flu A&B, Covid) Nasopharyngeal Swab     Status: None   Collection Time: 11/13/20  7:30 PM   Specimen: Nasopharyngeal Swab;  Nasopharyngeal(NP) swabs in vial transport medium  Result Value Ref Range Status   SARS Coronavirus 2 by RT PCR NEGATIVE NEGATIVE Final    Comment: (NOTE) SARS-CoV-2 target nucleic acids are NOT DETECTED.  The SARS-CoV-2 RNA is generally detectable in upper respiratory specimens during the acute phase of infection. The lowest concentration of SARS-CoV-2 viral copies this assay can detect is 138 copies/mL. A negative result does not preclude SARS-Cov-2 infection and should not be used as the sole basis for treatment or other patient management decisions. A negative result may occur with  improper specimen collection/handling, submission of specimen other than nasopharyngeal swab, presence of viral mutation(s) within the areas targeted by this assay, and inadequate number of viral copies(<138 copies/mL). A negative result must be combined with clinical observations, patient history, and epidemiological information. The expected result is Negative.  Fact Sheet for Patients:  EntrepreneurPulse.com.au  Fact Sheet for Healthcare Providers:  IncredibleEmployment.be  This test is no t yet approved or cleared by the [place] FDA and  has been authorized for detection and/or diagnosis of SARS-CoV-2 by FDA under an Emergency Use Authorization (EUA). This EUA will remain  in effect (meaning this test can be used) for the duration of the COVID-19 declaration under Section 564(b)(1) of the Act, 21 U.S.C.section 360bbb-3(b)(1), unless the authorization is terminated  or revoked sooner.       Influenza A by PCR NEGATIVE NEGATIVE Final   Influenza B by PCR NEGATIVE NEGATIVE Final    Comment: (NOTE) The Xpert Xpress SARS-CoV-2/FLU/RSV plus assay is intended as an aid in the diagnosis of influenza from Nasopharyngeal swab specimens and should not be used as a sole basis for treatment. Nasal washings and aspirates are unacceptable for Xpert Xpress  SARS-CoV-2/FLU/RSV testing.  Fact Sheet for Patients: EntrepreneurPulse.com.au  Fact Sheet for Healthcare Providers: IncredibleEmployment.be  This test is not yet approved or cleared by the [place] FDA and has been authorized for detection and/or diagnosis of SARS-CoV-2 by FDA under an Emergency Use Authorization (EUA). This EUA will remain in effect (meaning this test can be used) for the duration of the COVID-19 declaration under Section 564(b)(1) of the Act, 21 U.S.C. section 360bbb-3(b)(1), unless the authorization is terminated or revoked.  Performed at [facility], [place] [address] [place], [place] [address]   Urine Culture     Status: None   Collection Time: 11/13/20 10:23 PM   Specimen: Urine, Clean Catch  Result Value Ref Range Status   Specimen Description URINE, CLEAN CATCH  Final   Special Requests NONE  Final   Culture   Final    NO GROWTH Performed at [facility], [place] [address] [place], [place] [address]    Report Status 11/15/2020 FINAL  Final  Culture, blood (routine x 2)     Status: None   Collection Time: 11/14/20  9:19 AM   Specimen: BLOOD RIGHT FOREARM  Result Value Ref Range  Status   Specimen Description BLOOD RIGHT FOREARM  Final   Special Requests   Final    BOTTLES DRAWN AEROBIC AND ANAEROBIC Blood Culture results may not be optimal due to an excessive volume of blood received in culture bottles   Culture   Final    NO GROWTH 5 DAYS Performed at [facility], [place] [address], [place], [place] [address]    Report Status 11/19/2020 FINAL  Final  Culture, blood (Routine X 2) w Reflex to ID Panel     Status: None (Preliminary result)   Collection Time: 11/14/20 11:20 PM   Specimen: BLOOD LEFT FOREARM  Result Value Ref Range Status   Specimen Description BLOOD LEFT FOREARM  Final   Special Requests   Final    BOTTLES DRAWN AEROBIC ONLY Blood Culture adequate volume   Culture   Final     NO GROWTH 4 DAYS Performed at [facility], [place] [address] [place],  [address]    Report Status PENDING  Incomplete    Radiology Studies: IR Fluoro Guide CV Line Right  Result Date: 11/18/2020 INDICATION: End-stage renal disease EXAM: Ultrasound fluoroscopy guided tunneled IJ hemodialysis catheter placement MEDICATIONS: Ancef 2 gm IV; The antibiotic was administered within an appropriate time interval prior to skin puncture. ANESTHESIA/SEDATION: Moderate (conscious) sedation was employed during this procedure. A total of Versed 2 mg and Fentanyl 100 mcg was administered intravenously. Moderate Sedation Time: 9 minutes. The patient's level of consciousness and vital signs were monitored continuously by radiology nursing throughout the procedure under my direct supervision. FLUOROSCOPY TIME:  Fluoroscopy Time: 0 minutes 48 seconds (3 mGy). COMPLICATIONS: None immediate. PROCEDURE: Informed written consent was obtained from the patient after a thorough discussion of the procedural risks, benefits and alternatives. All questions were addressed. Maximal Sterile Barrier Technique was utilized including caps, mask, sterile gowns, sterile gloves, sterile drape, hand hygiene and skin antiseptic. A timeout was performed prior to the initiation of the procedure. The right internal jugular vein was evaluated with ultrasound and shown to be patent. A permanent ultrasound image was obtained and placed in the patient's medical record. Using sterile gel and a sterile probe cover, the right internal jugular vein was entered with a 21 ga needle during real time ultrasound guidance. 0.018 inch guidewire placed and 21 ga needle exchanged for transitional dilator set. Utilizing fluoroscopy, 0.035 inch guidewire advanced through the needle without difficulty. Seriel dilation was performed and peel-away sheath was placed. Attention then turned to the right anterior upper chest. Following local lidocaine  administration, the hemodialysis catheter was tunneled from the chest wall to the venotomy site. The catheter was inserted through the peel-away sheath. The tip of the catheter was positioned within the right atrium using fluoroscopic guidance. All lumens of the catheter aspirated and flushed well. The dialysis lumens were locked with Heparin. The catheter was secured to the skin with suture. The insertion site was covered with a Biopatch and sterile dressing. IMPRESSION: 19 cm tunneled right IJ hemodialysis catheter ready for use. Electronically Signed   By: Miachel Roux M.D.   On: 11/18/2020 15:42   IR US Guide Vasc Access Right  Result Date: 11/18/2020 INDICATION: End-stage renal disease EXAM: Ultrasound fluoroscopy guided tunneled IJ hemodialysis catheter placement MEDICATIONS: Ancef 2 gm IV; The antibiotic was administered within an appropriate time interval prior to skin puncture. ANESTHESIA/SEDATION: Moderate (conscious) sedation was employed during this procedure. A total of Versed 2 mg and Fentanyl 100 mcg was administered  intravenously. Moderate Sedation Time: 9 minutes. The patient's level of consciousness and vital signs were monitored continuously by radiology nursing throughout the procedure under my direct supervision. FLUOROSCOPY TIME:  Fluoroscopy Time: 0 minutes 48 seconds (3 mGy). COMPLICATIONS: None immediate. PROCEDURE: Informed written consent was obtained from the patient after a thorough discussion of the procedural risks, benefits and alternatives. All questions were addressed. Maximal Sterile Barrier Technique was utilized including caps, mask, sterile gowns, sterile gloves, sterile drape, hand hygiene and skin antiseptic. A timeout was performed prior to the initiation of the procedure. The right internal jugular vein was evaluated with ultrasound and shown to be patent. A permanent ultrasound image was obtained and placed in the patient's medical record. Using sterile gel and a  sterile probe cover, the right internal jugular vein was entered with a 21 ga needle during real time ultrasound guidance. 0.018 inch guidewire placed and 21 ga needle exchanged for transitional dilator set. Utilizing fluoroscopy, 0.035 inch guidewire advanced through the needle without difficulty. Seriel dilation was performed and peel-away sheath was placed. Attention then turned to the right anterior upper chest. Following local lidocaine administration, the hemodialysis catheter was tunneled from the chest wall to the venotomy site. The catheter was inserted through the peel-away sheath. The tip of the catheter was positioned within the right atrium using fluoroscopic guidance. All lumens of the catheter aspirated and flushed well. The dialysis lumens were locked with Heparin. The catheter was secured to the skin with suture. The insertion site was covered with a Biopatch and sterile dressing. IMPRESSION: 19 cm tunneled right IJ hemodialysis catheter ready for use. Electronically Signed   By: Miachel Roux M.D.   On: 11/18/2020 15:42     Scheduled Meds:  Chlorhexidine Gluconate Cloth  6 each Topical Q0600   Chlorhexidine Gluconate Cloth  6 each Topical Q0600   darbepoetin (ARANESP) injection - NON-DIALYSIS  40 mcg Subcutaneous Q Sun-1800   feeding supplement (NEPRO CARB STEADY)  237 mL Oral Q24H   lanthanum  1,000 mg Oral TID WC   multivitamin  1 tablet Oral QHS   sodium chloride flush  3 mL Intravenous Q12H   vitamin B-12  1,000 mcg Oral Daily   Continuous Infusions:  sodium chloride     sodium chloride     sodium chloride       LOS: 6 days    Time spent: 25 mins.    Shawna Clamp, MD [facility]   If 7PM-7AM, please contact night-coverage

## 2020-11-20 NOTE — Progress Notes (Signed)
     [phone]       [place] [address]             [phone]       CT scan reviewed. Soft tissue swelling and bone necrosis.  No obvious area of fluid collection requiring surgical drainage.  White count continues to trend down.  Continue antibiotic therapy.  No need for surgical intervention at this point.  Madeleine Fenn Bary Leriche

## 2020-11-20 NOTE — Progress Notes (Signed)
Nutrition Follow-up  DOCUMENTATION CODES:   Not applicable  INTERVENTION:  Recommend initiation of bowel regimen as no BM documented since 8/12  -Increase to Nepro Shake po BID, each supplement provides 425 kcal and 19 grams protein -Provided renal diet information; will follow-up as able -Continue renal MVI daily  NUTRITION DIAGNOSIS:   Inadequate oral intake related to decreased appetite as evidenced by per patient/family report.  ongoing  GOAL:   Patient will meet greater than or equal to 90% of their needs  progressing  MONITOR:   PO intake, Labs, I & O's, Weight trends  REASON FOR ASSESSMENT:   Consult Diet education (renal diet)  ASSESSMENT:   35 year old male with a past medical history of single kidney presented to ED with 3 to 4 weeks of worsening fatigue and unintentional weight loss. Pt found to have an AKI and imaging showed severe left hydronephrosis and hydroureter to the bladder.  Pt without renal recovery, so underwent tunneled HD cath placement on 8/16 followed by first HD (50m net UF). Pt unavailable at time of RD visit today as he was receiving second HD treatment. Outpatient HD setup is in process.   PO Intake: 50-100% x last 8 recorded meals (66% average meal intake)  Medications: Nepro shake daily, aranesp, fosrenol, rena-vit, vitamin B12 Labs: BUN 73 (H), Cr 9.82 (H), PO4 8.6 (H)  UOP: [id number] hours I/O: -[id number] admit  Admission weight: 79.8 kg Current weight: 78.6 kg  Recommend initiation of bowel regimen as no BM documented since 8/12  Diet Order:   Diet Order             Diet renal with fluid restriction Fluid restriction: 1200 mL Fluid; Room service appropriate? Yes; Fluid consistency: Thin  Diet effective now                   EDUCATION NEEDS:   Education needs have been addressed  Skin:  Skin Assessment: Reviewed RN Assessment  Last BM:  8/12  Height:   Ht Readings from Last 1 Encounters:  11/14/20 6'  3" (1.905 m)    Weight:   Wt Readings from Last 1 Encounters:  11/20/20 78.6 kg    Ideal Body Weight:  89.1 kg  BMI:  Body mass index is 21.66 kg/m.  Estimated Nutritional Needs:   Kcal:  2400-2600 kcal/d  Protein:  115-125g/d  Fluid:  1L+UOP    AmaLarkin InaS, RD, LDN (she/her/hers) RD pager number and weekend/on-call pager number located in [facility]

## 2020-11-20 NOTE — Procedures (Signed)
Patient seen on Hemodialysis. BP (!) 161/89 (BP Location: Left Arm)   Pulse 82   Temp 99.2 F (37.3 C)   Resp 20   Ht '6\' 3"'$  (1.905 m)   Wt 78.6 kg   SpO2 98%   BMI 21.66 kg/m   QB 300, UF goal 0 Tolerating treatment without complaints at this time.   Elmarie Shiley MD [facility]. Office # [phone] Pager # [phone] 9:50 AM

## 2020-11-20 NOTE — TOC Progression Note (Signed)
Transition of Care [facility]) - Progression Note    Patient Details  Name: MUHAMAD GAULD MRN: [id number] Date of Birth: March 08, 1986  [facility] CM/SW Contact  Tom-Johnson, Renea Ee, RN Phone Number: 11/20/2020, 11:04 AM  Clinical Narrative:    Spoke with Linus Orn [phone]) about outpatient dialysis for patient. States he is waiting for financial clearance as patient does not have insurance. MD made are. Will continue to follow for TOC needs.   Expected Discharge Plan: Home/Self Care Barriers to Discharge: Continued Medical Work up  Expected Discharge Plan and Services Expected Discharge Plan: Home/Self Care In-house Referral: Clinical Social Work Discharge Planning Services: CM Consult   Living arrangements for the past 2 months: Single Family Home                                       Social Determinants of Health (SDOH) Interventions    Readmission Risk Interventions No flowsheet data found.

## 2020-11-20 NOTE — Progress Notes (Signed)
Out Patient Arrangements:   Continue to wait on financial clearance due to pt not having insurance.   Have been requested to arrange out pt HD for pt. [facility] Please advise of d/c date.    Linus Orn [facility] [phone]

## 2020-11-21 ENCOUNTER — Inpatient Hospital Stay (HOSPITAL_COMMUNITY): Payer: Medicaid Other

## 2020-11-21 ENCOUNTER — Encounter (HOSPITAL_COMMUNITY): Payer: Self-pay | Admitting: Family Medicine

## 2020-11-21 DIAGNOSIS — N170 Acute kidney failure with tubular necrosis: Secondary | ICD-10-CM

## 2020-11-21 LAB — RENAL FUNCTION PANEL
Albumin: 2.7 g/dL — ABNORMAL LOW (ref 3.5–5.0)
Anion gap: 13 (ref 5–15)
BUN: 49 mg/dL — ABNORMAL HIGH (ref 6–20)
CO2: 24 mmol/L (ref 22–32)
Calcium: 9 mg/dL (ref 8.9–10.3)
Chloride: 100 mmol/L (ref 98–111)
Creatinine, Ser: 7.98 mg/dL — ABNORMAL HIGH (ref 0.61–1.24)
GFR, Estimated: 8 mL/min — ABNORMAL LOW (ref >60)
Glucose, Bld: 86 mg/dL (ref 70–99)
Phosphorus: 7.2 mg/dL — ABNORMAL HIGH (ref 2.5–4.6)
Potassium: 4 mmol/L (ref 3.5–5.1)
Sodium: 137 mmol/L (ref 135–145)

## 2020-11-21 LAB — CBC
HCT: 29.4 % — ABNORMAL LOW (ref 39.0–52.0)
Hemoglobin: 8.9 g/dL — ABNORMAL LOW (ref 13.0–17.0)
MCH: 28.4 pg (ref 26.0–34.0)
MCHC: 30.3 g/dL (ref 30.0–36.0)
MCV: 93.9 fL (ref 80.0–100.0)
Platelets: 318 10*3/uL (ref 150–400)
RBC: 3.13 MIL/uL — ABNORMAL LOW (ref 4.22–5.81)
RDW: 13.9 % (ref 11.5–15.5)
WBC: 12.8 10*3/uL — ABNORMAL HIGH (ref 4.0–10.5)
nRBC: 0 % (ref 0.0–0.2)

## 2020-11-21 LAB — CYCLIC CITRUL PEPTIDE ANTIBODY, IGG/IGA: CCP Antibodies IgG/IgA: 7 units (ref 0–19)

## 2020-11-21 LAB — RETICULOCYTES
Immature Retic Fract: 16.4 % — ABNORMAL HIGH (ref 2.3–15.9)
RBC.: 3.13 MIL/uL — ABNORMAL LOW (ref 4.22–5.81)
Retic Count, Absolute: 79.5 10*3/uL (ref 19.0–186.0)
Retic Ct Pct: 2.5 % (ref 0.4–3.1)

## 2020-11-21 MED ORDER — KIDNEY FAILURE BOOK: Freq: Once | Status: AC

## 2020-11-21 MED ORDER — HEPARIN SODIUM (PORCINE) 1000 UNIT/ML IJ SOLN
INTRAMUSCULAR | Status: AC
  Administered 2020-11-21: 1000 [IU]
  Filled 2020-11-21: qty 4

## 2020-11-21 MED ORDER — ONDANSETRON HCL 4 MG/2ML IJ SOLN
INTRAMUSCULAR | Status: AC
  Filled 2020-11-21: qty 2

## 2020-11-21 MED ORDER — CHLORHEXIDINE GLUCONATE CLOTH 2 % EX PADS: 6.0000 | MEDICATED_PAD | Freq: Every day | CUTANEOUS | Status: DC

## 2020-11-21 NOTE — Progress Notes (Signed)
Rounded on patient today in correlation to transition to outpatient HD. Patient found lying in the bed with permission granted to speak about HD. Patient reports that he has plans to start PD as opposed to fitula placement. Reviewed different modalities of peritoneal dialysis with patient and reviewed in booklet. Patient was given the Kidney Failure book. Patient also educated at the bedside regarding care of tunneled dialysis catheter and proper medication administration on HD days as he plans to continue HD until he can start PD.  Patient also educated on the importance of adhering to scheduled dialysis treatments, the effects of fluid overload, hyperkalemia and hyperphosphatemia. Patient capable of re-verbalizing via teach back method. Patient with no further questions at this time. Handouts and contact information provided to patient for any further assistance. Will follow as appropriate.   Dorthey Sawyer, RN  Dialysis Nurse Coordinator Phone: [phone]

## 2020-11-21 NOTE — Progress Notes (Signed)
[name]  Assessment/ Plan:  # Progression of chronic kidney disease stage V now end-stage renal disease: Likely progression partly from obstruction in the setting of a solitary kidney and renal function without significant recovery after Foley placement.  He was started on hemodialysis via his right IJ [facility]; he intends to transition to peritoneal dialysis to maintain independence/mobility as an outpatient (will be followed by Dr.Foster).  Process initiated for outpatient dialysis unit placement (TCU). He has been tolerating dialysis well, s/p HD on 8/18, plan for next HD tomorrow, will follow TTS schedule.   #  Anemia: Secondary to chronic kidney disease/iron deficiency.  Status post intravenous iron and started on ESA. Monitor Hb.   # Metabolic acidosis: Status post initial management with sodium bicarbonate drip and transitioned to oral sodium bicarbonate- now with dialysis.  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 Per primary team.  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 Renal diet.   # Acute urinary retention: pt wants to discontinue it, spoke with RN. Per RN, Dr Dwyane Dee will consult urology about. It.   Subjective:  No new event, has been tolerating dialysis well. He wants to take catheter out.  Objective Vital signs in last 24 hours: Vitals:   11/20/20 1202 11/20/20 2120 11/21/20 0640 11/21/20 0946  BP: 135/76 122/78 128/81 127/83  Pulse: 86 76 98 81  Resp: '18 18  17  '$ Temp:  98.7 F (37.1 C) 98.1 F (36.7 C) 98.7 F (37.1 C)  TempSrc:  Oral Oral Oral  SpO2: 96% 99% 98% 98%  Weight:  78.6 kg    Height:       Weight change:   Intake/Output Summary (Last 24 hours) at 11/21/2020 1233 Last data filed at 11/21/2020 0601 Gross per 24 hour   Intake 0 ml  Output 1750 ml  Net -1750 ml       Labs: Basic Metabolic Panel: Recent Labs  Lab 11/18/20 0953 11/19/20 0319 11/20/20 0717  NA 138 138 136  K 3.7 3.5 3.6  CL 102 102 101  CO2 17* 23 21*  GLUCOSE 83 83 91  BUN 115* 70* 73*  CREATININE 11.87* 8.34* 9.82*  CALCIUM 9.2 9.3 9.2  PHOS 9.5* 7.4* 8.6*   Liver Function Tests: Recent Labs  Lab 11/16/20 0639 11/17/20 0341 11/19/20 0319 11/20/20 0717  AST 8* 10* 17  --   ALT '9 8 10  '$ --   ALKPHOS 107 106 118  --   BILITOT 0.9 0.6 0.6  --   PROT 6.8 6.8 7.0  --   ALBUMIN 2.4* 2.3*  2.4* 2.6* 2.6*   No results for input(s): LIPASE, AMYLASE in the last 168 hours. No results for input(s): AMMONIA in the last 168 hours. CBC: Recent Labs  Lab 11/16/20 0639 11/17/20 0341 11/18/20 0953 11/19/20 0319 11/20/20 0717  WBC 19.8* 16.3* 12.1* 11.2* 12.2*  NEUTROABS 16.5* 13.7*  --  8.7*  --   HGB 8.8* 8.7* 8.6* 9.0* 9.4*  HCT 26.2* 26.1* 26.3* 27.7* 28.7*  MCV 85.9 86.1 88.6 88.2 90.0  PLT 269 275 274 286 291   Cardiac Enzymes: Recent Labs  Lab 11/15/20 0636  CKTOTAL 38*   CBG: No results for input(s): GLUCAP in the last 168 hours.  Iron Studies: No results for input(s): IRON, TIBC, TRANSFERRIN, FERRITIN in the last 72 hours. Studies/Results: CT chest without contrast  Result  Date: 11/20/2020 CLINICAL DATA:  Chest wall mass or lump (Ped 0-18y) EXAM: CT CHEST WITHOUT CONTRAST TECHNIQUE: Multidetector CT imaging of the chest was performed following the standard protocol without IV contrast. COMPARISON:  MRI 11/18/2020, chest CT 02/22/2021. FINDINGS: Cardiovascular: Normal cardiac size.No pericardial disease.Normal size main and branch pulmonary arteries.The thoracic aorta is unremarkable. There is a right IJ catheter with tip within the right atrium. Mediastinum/Nodes: No lymphadenopathy.The thyroid is unremarkable.Esophagus is unremarkable.The trachea is unremarkable. Lungs/Pleura: No focal airspace  consolidation.No suspicious pulmonary nodules or masses.No pleural effusion.No pneumothorax. Upper Abdomen: No acute abnormality. Partially visualized left renal hydronephrosis as described on prior CT abdomen and pelvis. Musculoskeletal: Bony erosive/destructive changes involving the medial clavicle at the sternoclavicular joint.Minimal right [place] joint distension. This is similar appearance to prior chest CT on August 11th. Unchanged appearance of the left medial clavicle. Unchanged mild lucency in the right posterior first rib which has a benign appearance. Right-sided os acromiale noted. 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 IMPRESSION: Unchanged erosive/destructive changes of the right medial clavicle with adjacent soft tissue thickening and edema at the sternoclavicular joint. Findings could reflect inflammatory/crystalline arthropathy or septic [place] joint with osteomyelitis of the right medial clavicle. Amyloid arthropathy is also possible given history of ESRD and presence of renal osteodystrophy. Unchanged CT appearance of the left medial clavicle, which demonstrated marrow edema on prior MRI. Electronically Signed   By: Maurine Simmering M.D.   On: 11/20/2020 16:04    Medications: Infusions:  sodium chloride      Scheduled Medications:  Chlorhexidine Gluconate Cloth  6 each Topical Q0600   Chlorhexidine Gluconate Cloth  6 each Topical Q0600   darbepoetin (ARANESP) injection - NON-DIALYSIS  40 mcg Subcutaneous Q Sun-1800   feeding supplement (NEPRO CARB STEADY)  237 mL Oral BID BM   lanthanum  1,000 mg Oral TID WC   multivitamin  1 tablet Oral QHS   sodium chloride flush  3 mL Intravenous Q12H   vitamin B-12  1,000 mcg Oral Daily    have reviewed scheduled and prn medications.  Physical Exam: General:NAD, comfortable Heart:RRR, s1s2 nl Lungs:clear b/l, no crackle Abdomen:soft, Non-tender, non-distended Extremities:No edema Dialysis Access: RIJ TDC   Micheal Bean Micheal Bean 11/21/2020,12:33 PM  LOS: 7 days

## 2020-11-21 NOTE — Plan of Care (Signed)
  Problem: Activity: Goal: Risk for activity intolerance will decrease Outcome: Progressing   Problem: Nutrition: Goal: Adequate nutrition will be maintained Outcome: Progressing   

## 2020-11-21 NOTE — Progress Notes (Signed)
PROGRESS NOTE    Micheal Bean  KYH:[id number] DOB: 1986-01-16 DOA: 11/13/2020 PCP: Patient, No Pcp Per (Inactive)    Brief Narrative: This 35 years old male with PMH significant for hx. of right nephrectomy as an infant presented with constellation of intermittent symptoms including headache, lightheadedness and chest tightness for 3 to 4 days.  He has tried some fluids at home which seemed to help a little,  he has also tried a dose of his mother's amlodipine but did not seem to help.  He does reports losing 20 pounds in last 2 months. He was seen in urgent care and was sent to the ED for further evaluation.  Patient was admitted for AKI on CKD,  found to have left-sided hydronephrosis with possible UTI, severe anemia and leukocytosis.  Patient started on hemodialysis.  Plan is to arrange outpatient hemodialysis set up so patient can be discharged.  Assessment & Plan:   Principal Problem:   Acute renal failure (ARF) (HCC) Active Problems:   Acute lower UTI   Leukocytosis   Bone lesion   AKI (acute kidney injury) ([place])   Osteomyelitis (HCC)   AKI on CKD stage III: Progression of CKD stage III now end-stage renal disease requiring hemodialysis. Most recent creatinine was 2.0 in 2014 unclear what his recent baseline may have been. 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 He is found to have severe hydronephrosis of the remaining left kidney. Urology consulted, Foley inserted.  Patient may need cystoscopy/retrograde study if creatinine not improving Nephrology consulted, no renal recovery ,  Patient underwent tunneled hemodialysis catheter on 11/18/20 followed by hemodialysis. SPEP, UPEP, free light chains - elevated kappa/lamda light chain - may need to discuss with heme, follow SPEP - monoclonal protein not apparent.  Hematology consulted. Patient underwent hemodialysis on 8/16, 8/18 Patient is interested in outpatient peritoneal dialysis to  maintain independence /mobility as an outpatient( He will be followed by Dr. Royce Macadamia) Plan is to arrange outpatient hemodialysis set up so patient can be discharged.  Acute urinary retention: Continue Foley catheter, follow-up with urology.  Anion gap metabolic acidosis: Resolved with sodium bicarbonate infusion and now with dialysis.  Soft tissue thickening around right supraclavicular joint: Leukocytosis > Improving: There was a concern for UTI, but urine culture no growth. CT chest showed  heterogeneous lucency involving head of right clavicle concerning for infection/osteomyelitis. MRI [place] joint showed  fairly symmetric marrow edema in both clavicular heads with mild surrounding soft tissue swelling (argues against infection, favors inflammatory or crystal arthropathy) Completed ceftriaxone for UTI.  Blood cultures no growth to date. ID recommended holding off on antibiotics at this point. CT Sx states there is no obvious area of fluid collection requiring surgical drainage,  no need for surgical intervention at this point.  Elevated Inflammatory Markers ESR >140, CRP  Follow ANA, anti CCP, RF  Normocytic normochromic anemia: Could be multifactorial due to B12 deficiency,  anemia of chronic disease and iron deficiency. Found to have significant anemia 6.8, s/p 3 units PRBC Continue B12 and folate supplementation, iron supplementation. No history of black stools. Hb stable at 9.0 > 9.4   Elevated D Dimer Suspect related to inflammation. Negative LE [place] for DVT Consider VQ scan, but no evidence of hypoxia, denies CP - suspect dimer related to other systemic issues, but low threshold for further w/u.    Family requested transfer to [place] earlier in hospitalization.  [facility] declined given family request.  Consider if medical need going further.  He can also follow outpatient as desired.   DVT prophylaxis: Heparin Code Status: Full code. Family Communication: Wife at bed  side. Disposition Plan:    Status is: Inpatient  Remains inpatient appropriate because:Inpatient level of care appropriate due to severity of illness  Dispo: The patient is from: Home              Anticipated d/c is to: Home              Patient currently is not medically stable to d/c.   Difficult to place patient No  Consultants:  Nephrology ID [email or web address].  Procedures:  HD, TDC Antimicrobials:   Anti-infectives (From admission, onward)    Start     Dose/Rate Route Frequency Ordered Stop   11/18/20 1522  ceFAZolin (ANCEF) IVPB 2g/100 mL premix        over 30 Minutes Intravenous Continuous PRN 11/18/20 1532 11/18/20 1522   11/18/20 1512  ceFAZolin (ANCEF) 2-4 GM/100ML-% IVPB       Note to Pharmacy: [name]   : cabinet override      11/18/20 1512 11/19/20 0329   11/18/20 0000  ceFAZolin (ANCEF) IVPB 2g/100 mL premix  Status:  Discontinued        2 g 200 mL/hr over 30 Minutes Intravenous To Radiology 11/17/20 1014 11/17/20 1014   11/17/20 0000  ceFAZolin (ANCEF) IVPB 2g/100 mL premix  Status:  Discontinued        2 g 200 mL/hr over 30 Minutes Intravenous To Radiology 11/16/20 1400 11/17/20 0906   11/14/20 2100  DAPTOmycin (CUBICIN) 700 mg in sodium chloride 0.9 % IVPB  Status:  Discontinued        8 mg/kg  85.2 kg (Adjusted) 128 mL/hr over 30 Minutes Intravenous Every 48 hours 11/14/20 2006 11/17/20 1127   11/14/20 2045  cefTRIAXone (ROCEPHIN) 2 g in sodium chloride 0.9 % 100 mL IVPB  Status:  Discontinued        2 g 200 mL/hr over 30 Minutes Intravenous Every 24 hours 11/14/20 1948 11/17/20 1127   11/13/20 2230  cefTRIAXone (ROCEPHIN) 1 g in sodium chloride 0.9 % 100 mL IVPB  Status:  Discontinued        1 g 200 mL/hr over 30 Minutes Intravenous Every 24 hours 11/13/20 2223 11/14/20 1948        Subjective: Patient was seen and examined at bedside.  Overnight events noted. Patient seems much improved.  He reports discomfort associated with Foley catheter,  wants this  to be removed. Outpatient hemodialysis set up is in process.  Objective: Vitals:   11/20/20 1202 11/20/20 2120 11/21/20 0640 11/21/20 0946  BP: 135/76 122/78 128/81 127/83  Pulse: 86 76 98 81  Resp: $Remo'18 18  17  'YgBht$ Temp:  98.7 F (37.1 C) 98.1 F (36.7 C) 98.7 F (37.1 C)  TempSrc:  Oral Oral Oral  SpO2: 96% 99% 98% 98%  Weight:  78.6 kg    Height:        Intake/Output Summary (Last 24 hours) at 11/21/2020 1058 Last data filed at 11/21/2020 0601 Gross per 24 hour  Intake 0 ml  Output 1750 ml  Net -1750 ml   Filed Weights   11/20/20 0840 11/20/20 1130 11/20/20 2120  Weight: 78.6 kg 78.6 kg 78.6 kg    Examination:  General exam: Appears comfortable, not in any acute distress.  Right IJ catheter noted. Respiratory system: Clear to auscultation bilaterally, no wheezing, crackles. Cardiovascular  system: S1-S2 heard, regular rate and rhythm, no murmur Gastrointestinal system: Abdomen is soft, nontender, nondistended, BS positive Central nervous system: Alert and oriented x3, no focal neurological deficits. Extremities: No edema, no cyanosis, no clubbing. Skin: No rash. Psychiatry: Mood and affect appropriate.    Data Reviewed: I have personally reviewed following labs and imaging studies  CBC: Recent Labs  Lab 11/15/20 0636 11/16/20 0639 11/17/20 0341 11/18/20 0953 11/19/20 0319 11/20/20 0717  WBC 23.7* 19.8* 16.3* 12.1* 11.2* 12.2*  NEUTROABS 20.3* 16.5* 13.7*  --  8.7*  --   HGB 8.6* 8.8* 8.7* 8.6* 9.0* 9.4*  HCT 25.1* 26.2* 26.1* 26.3* 27.7* 28.7*  MCV 86.3 85.9 86.1 88.6 88.2 90.0  PLT 257 269 275 274 286 169   Basic Metabolic Panel: Recent Labs  Lab 11/15/20 0636 11/15/20 1520 11/16/20 0639 11/17/20 0341 11/18/20 0953 11/19/20 0319 11/20/20 0717  NA 136   < > 137 136 138 138 136  K 2.9*   < > 3.7 3.6 3.7 3.5 3.6  CL 101   < > 102 101 102 102 101  CO2 16*   < > 16* 18* 17* 23 21*  GLUCOSE 91   < > 86 83 83 83 91  BUN 118*   < > 117* 113* 115* 70*  73*  CREATININE 11.81*   < > 11.90* 11.52* 11.87* 8.34* 9.82*  CALCIUM 8.0*   < > 9.2 9.0 9.2 9.3 9.2  MG 1.5*  --  1.9  --  1.9 1.9  --   PHOS 11.8*  --  11.3* 10.2* 9.5* 7.4* 8.6*   < > = values in this interval not displayed.   GFR: Estimated Creatinine Clearance: 12 mL/min (A) (by C-G formula based on SCr of 9.82 mg/dL (H)). Liver Function Tests: Recent Labs  Lab 11/15/20 0636 11/16/20 0639 11/17/20 0341 11/19/20 0319 11/20/20 0717  AST 8* 8* 10* 17  --   ALT $Re'9 9 8 10  'koP$ --   ALKPHOS 100 107 106 118  --   BILITOT 0.7 0.9 0.6 0.6  --   PROT 6.5 6.8 6.8 7.0  --   ALBUMIN 2.4* 2.4* 2.3*  2.4* 2.6* 2.6*   No results for input(s): LIPASE, AMYLASE in the last 168 hours. No results for input(s): AMMONIA in the last 168 hours. Coagulation Profile: No results for input(s): INR, PROTIME in the last 168 hours.  Cardiac Enzymes: Recent Labs  Lab 11/15/20 0636  CKTOTAL 38*   BNP (last 3 results) No results for input(s): PROBNP in the last 8760 hours. HbA1C: No results for input(s): HGBA1C in the last 72 hours. CBG: No results for input(s): GLUCAP in the last 168 hours. Lipid Profile: No results for input(s): CHOL, HDL, LDLCALC, TRIG, CHOLHDL, LDLDIRECT in the last 72 hours. Thyroid Function Tests: No results for input(s): TSH, T4TOTAL, FREET4, T3FREE, THYROIDAB in the last 72 hours. Anemia Panel: No results for input(s): VITAMINB12, FOLATE, FERRITIN, TIBC, IRON, RETICCTPCT in the last 72 hours. Sepsis Labs: No results for input(s): PROCALCITON, LATICACIDVEN in the last 168 hours.  Recent Results (from the past 240 hour(s))  Resp Panel by RT-PCR (Flu A&B, Covid) Nasopharyngeal Swab     Status: None   Collection Time: 11/13/20  7:30 PM   Specimen: Nasopharyngeal Swab; Nasopharyngeal(NP) swabs in vial transport medium  Result Value Ref Range Status   SARS Coronavirus 2 by RT PCR NEGATIVE NEGATIVE Final    Comment: (NOTE) SARS-CoV-2 target nucleic acids are NOT  DETECTED.  The  SARS-CoV-2 RNA is generally detectable in upper respiratory specimens during the acute phase of infection. The lowest concentration of SARS-CoV-2 viral copies this assay can detect is 138 copies/mL. A negative result does not preclude SARS-Cov-2 infection and should not be used as the sole basis for treatment or other patient management decisions. A negative result may occur with  improper specimen collection/handling, submission of specimen other than nasopharyngeal swab, presence of viral mutation(s) within the areas targeted by this assay, and inadequate number of viral copies(<138 copies/mL). A negative result must be combined with clinical observations, patient history, and epidemiological information. The expected result is Negative.  Fact Sheet for Patients:  BloggerCourse.com  Fact Sheet for Healthcare Providers:  SeriousBroker.it  This test is no t yet approved or cleared by the [place] FDA and  has been authorized for detection and/or diagnosis of SARS-CoV-2 by FDA under an Emergency Use Authorization (EUA). This EUA will remain  in effect (meaning this test can be used) for the duration of the COVID-19 declaration under Section 564(b)(1) of the Act, 21 U.S.C.section 360bbb-3(b)(1), unless the authorization is terminated  or revoked sooner.       Influenza A by PCR NEGATIVE NEGATIVE Final   Influenza B by PCR NEGATIVE NEGATIVE Final    Comment: (NOTE) The Xpert Xpress SARS-CoV-2/FLU/RSV plus assay is intended as an aid in the diagnosis of influenza from Nasopharyngeal swab specimens and should not be used as a sole basis for treatment. Nasal washings and aspirates are unacceptable for Xpert Xpress SARS-CoV-2/FLU/RSV testing.  Fact Sheet for Patients: BloggerCourse.com  Fact Sheet for Healthcare Providers: SeriousBroker.it  This test is not yet  approved or cleared by the [place] FDA and has been authorized for detection and/or diagnosis of SARS-CoV-2 by FDA under an Emergency Use Authorization (EUA). This EUA will remain in effect (meaning this test can be used) for the duration of the COVID-19 declaration under Section 564(b)(1) of the Act, 21 U.S.C. section 360bbb-3(b)(1), unless the authorization is terminated or revoked.  Performed at [facility], [address], [place], [place] [address]   Urine Culture     Status: None   Collection Time: 11/13/20 10:23 PM   Specimen: Urine, Clean Catch  Result Value Ref Range Status   Specimen Description URINE, CLEAN CATCH  Final   Special Requests NONE  Final   Culture   Final    NO GROWTH Performed at [facility], [address], [place], [place] [address]    Report Status 11/15/2020 FINAL  Final  Culture, blood (routine x 2)     Status: None   Collection Time: 11/14/20  9:19 AM   Specimen: BLOOD RIGHT FOREARM  Result Value Ref Range Status   Specimen Description BLOOD RIGHT FOREARM  Final   Special Requests   Final    BOTTLES DRAWN AEROBIC AND ANAEROBIC Blood Culture results may not be optimal due to an excessive volume of blood received in culture bottles   Culture   Final    NO GROWTH 5 DAYS Performed at [facility], [address] [place], [place] [address]    Report Status 11/19/2020 FINAL  Final  Culture, blood (Routine X 2) w Reflex to ID Panel     Status: None   Collection Time: 11/14/20 11:20 PM   Specimen: BLOOD LEFT FOREARM  Result Value Ref Range Status   Specimen Description BLOOD LEFT FOREARM  Final   Special Requests   Final  BOTTLES DRAWN AEROBIC ONLY Blood Culture adequate volume   Culture   Final    NO GROWTH 5 DAYS Performed at [facility], [place] [address] [place], [place] [address]    Report Status 11/20/2020 FINAL  Final    Radiology Studies: CT chest without contrast  Result Date: 11/20/2020 CLINICAL DATA:   Chest wall mass or lump (Ped 0-18y) EXAM: CT CHEST WITHOUT CONTRAST TECHNIQUE: Multidetector CT imaging of the chest was performed following the standard protocol without IV contrast. COMPARISON:  MRI 11/18/2020, chest CT 02/22/2021. FINDINGS: Cardiovascular: Normal cardiac size.No pericardial disease.Normal size main and branch pulmonary arteries.The thoracic aorta is unremarkable. There is a right IJ catheter with tip within the right atrium. Mediastinum/Nodes: No lymphadenopathy.The thyroid is unremarkable.Esophagus is unremarkable.The trachea is unremarkable. Lungs/Pleura: No focal airspace consolidation.No suspicious pulmonary nodules or masses.No pleural effusion.No pneumothorax. Upper Abdomen: No acute abnormality. Partially visualized left renal hydronephrosis as described on prior CT abdomen and pelvis. Musculoskeletal: Bony erosive/destructive changes involving the medial clavicle at the sternoclavicular joint.Minimal right [place] joint distension. This is similar appearance to prior chest CT on August 11th. Unchanged appearance of the left medial clavicle. Unchanged mild lucency in the right posterior first rib which has a benign appearance. Right-sided os acromiale noted. 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 IMPRESSION: Unchanged erosive/destructive changes of the right medial clavicle with adjacent soft tissue thickening and edema at the sternoclavicular joint. Findings could reflect inflammatory/crystalline arthropathy or septic [place] joint with osteomyelitis of the right medial clavicle. Amyloid arthropathy is also possible given history of ESRD and presence of renal osteodystrophy. Unchanged CT appearance of the left medial clavicle, which demonstrated marrow edema on prior MRI. Electronically Signed   By: Maurine Simmering M.D.   On: 11/20/2020 16:04     Scheduled Meds:  Chlorhexidine Gluconate Cloth  6 each Topical Q0600   Chlorhexidine Gluconate Cloth  6 each Topical Q0600    darbepoetin (ARANESP) injection - NON-DIALYSIS  40 mcg Subcutaneous Q Sun-1800   feeding supplement (NEPRO CARB STEADY)  237 mL Oral BID BM   lanthanum  1,000 mg Oral TID WC   multivitamin  1 tablet Oral QHS   sodium chloride flush  3 mL Intravenous Q12H   vitamin B-12  1,000 mcg Oral Daily   Continuous Infusions:  sodium chloride       LOS: 7 days    Time spent: 25 mins.    Shawna Clamp, MD [facility]   If 7PM-7AM, please contact night-coverage

## 2020-11-21 NOTE — Consult Note (Addendum)
Micheal Bean  [phone] Fax:([phone]) [place]  Referring MD: Dr. Shawna Clamp   Reason for Referral: Elevated serum free light chains  HPI: Micheal Bean is a 35 year old male with a past medical history significant for history of right nephrectomy.  He presented with a constellation of intermittent symptoms including headache, lightheadedness, and chest tightness.  He has had a 3 to 4-day history of headache, lightheadedness, chest tightness, nausea, diarrhea.  He tried taking some fluids at home which did seem to help some.  He also tried taking a dose as well as amlodipine which did not help.  He felt better laying down and worse standing up.  He reported 20 pound weight loss in the past 2 months.  He was initially seen at urgent care and sent to the emergency department for further evaluation.  In the emergency department, labs notable for a sodium of 131, BUN 111, creatinine 12.59.  Labs were normal 8 years prior.  CBC showed a hemoglobin of 6 and WBC was 29.9.  UA showed positive nitrites and large amount of leukocytes.  He also had protein in his urine.  His vitamin B12 level was 249, folate 6.8, ferritin 1033, iron 9, TIBC 153, percent saturation 6%, immature reticulocyte fraction 20.8%, LDH 143, haptoglobin 343.  CT chest/abdomen/pelvis without contrast showed edema and soft tissue thickening centered to the right sternoclavicular joint with heterogeneous lucency involving the head of the right clavicle, similar changes involving head of the left clavicle the less extensive and findings could be secondary to osteomyelitis and infection with tumor and additional consideration, status post right nephrectomy, severe left-sided hydronephrosis and hydroureter down to the level of the bladder.  The patient was seen by urology due to urinary retention and hydronephrosis who recommended Foley catheter placement.  Nephrology also consulted  secondary to acute renal failure who performed additional work-up and felt renal failure was likely progression probably from obstruction in the setting of solitary kidney.  He has been started on dialysis.  As part of his work-up, nephrology ordered an SPEP and light chains.  SPEP performed on 11/14/2020 showed no M spike and there was no evidence of monoclonal protein.  Light chains performed on the same date showed an elevated kappa free light chain at 178.7, elevated lambda free light chain at 88.5, and elevated kappa, lambda light chain ratio at 2.02. Due to concern for osteomyelitis, infectious disease was consulted and he was initially started on antibiotics which have now been stopped.  Cardiothoracic surgery also consulted due to the soft tissue swelling involving the right clavicle and no surgical intervention is recommended.  The patient reports that he is feeling much better than prior to admission.  He feels that he is back to baseline.  He currently denies fevers, chills, headaches, dizziness, chest pain, shortness of breath, abdominal pain, nausea, vomiting, constipation, diarrhea, bleeding, bony pain.  The patient is engaged.  Denies tobacco use.  Reports he drinks alcohol on occasion.  Family history significant for mother with hypertension.  Hematology was asked see the patient to make recommendations regarding his elevated serum light chains.  Past Medical History:  Diagnosis Date   Renal disorder    only has right kidney  :  Past Surgical History:  Procedure Laterality Date   IR FLUORO GUIDE CV LINE RIGHT  11/18/2020   IR US GUIDE VASC ACCESS RIGHT  11/18/2020   NEPHRECTOMY     childhhod, RT side  :  CURRENT MEDS: Current Facility-Administered Medications  Medication Dose Route Frequency Provider Last Rate Last Admin   0.9 %  sodium chloride infusion  10 mL/hr Intravenous Once Marcelyn Bruins, MD       acetaminophen (TYLENOL) tablet 650 mg  650 mg Oral Q6H PRN Marcelyn Bruins, MD       Or   acetaminophen (TYLENOL) suppository 650 mg  650 mg Rectal Q6H PRN Marcelyn Bruins, MD       Chlorhexidine Gluconate Cloth 2 % PADS 6 each  6 each Topical Q0600 Elodia Florence., MD   6 each at 11/20/20 [id number]   Chlorhexidine Gluconate Cloth 2 % PADS 6 each  6 each Topical Q0600 Claudia Desanctis, MD   6 each at 11/18/20 1023   [START ON 11/22/2020] Chlorhexidine Gluconate Cloth 2 % PADS 6 each  6 each Topical Q0600 Rosita Fire, MD       Darbepoetin Alfa (ARANESP) injection 40 mcg  40 mcg Subcutaneous Q Sun-1800 Claudia Desanctis, MD   40 mcg at 11/16/20 2049   feeding supplement (NEPRO CARB STEADY) liquid 237 mL  237 mL Oral BID BM Shawna Clamp, MD   237 mL at 11/21/20 [id number]   kidney failure book   Does not apply Once Elmarie Shiley, MD       lanthanum [facility]) chewable tablet 1,000 mg  1,000 mg Oral TID WC Elmarie Shiley, MD   1,000 mg at 11/21/20 1220   LORazepam (ATIVAN) injection 1 mg  1 mg Intravenous Q6H PRN Elodia Florence., MD       multivitamin (RENA-VIT) tablet 1 tablet  1 tablet Oral QHS Elodia Florence., MD   1 tablet at 11/20/20 2159   ondansetron (ZOFRAN-ODT) disintegrating tablet 4 mg  4 mg Oral Q6H PRN Elodia Florence., MD   4 mg at 11/19/20 1417   polyethylene glycol (MIRALAX / GLYCOLAX) packet 17 g  17 g Oral Daily PRN Marcelyn Bruins, MD       sodium chloride flush (NS) 0.9 % injection 3 mL  3 mL Intravenous Q12H Marcelyn Bruins, MD   3 mL at 11/20/20 2200   vitamin B-12 (CYANOCOBALAMIN) tablet 1,000 mcg  1,000 mcg Oral Daily Elodia Florence., MD   1,000 mcg at 11/21/20 0831    No Known Allergies:   Family History  Problem Relation Age of Onset   Hypertension Mother   :  Social History   Socioeconomic History   Marital status: Single    Spouse name: Not on file   Number of children: Not on file   Years of education: Not on file   Highest education level: Not on file  Occupational History   Not on  file  Tobacco Use   Smoking status: Never   Smokeless tobacco: Never  Substance and Sexual Activity   Alcohol use: Yes    Comment: occassional   Drug use: Yes    Types: Marijuana   Sexual activity: Not on file  Other Topics Concern   Not on file  Social History Narrative   Not on file   Social Determinants of Health   Financial Resource Strain: Not on file  Food Insecurity: Not on file  Transportation Needs: Not on file  Physical Activity: Not on file  Stress: Not on file  Social Connections: Not on file  Intimate Partner Violence: Not on file  :  REVIEW OF SYSTEMS:  A comprehensive  14 point review of systems was negative except as noted in the HPI.    Exam: Patient Vitals for the past 24 hrs:  BP Temp Temp src Pulse Resp SpO2 Weight  11/21/20 0946 127/83 98.7 F (37.1 C) Oral 81 17 98 % --  11/21/20 0640 128/81 98.1 F (36.7 C) Oral 98 -- 98 % --  11/20/20 2120 122/78 98.7 F (37.1 C) Oral 76 18 99 % 78.6 kg    General:  well-nourished in no acute distress.   Eyes:  no scleral icterus.   ENT:  There were no oropharyngeal lesions.   Lymphatics:  Negative cervical, supraclavicular or axillary adenopathy.   Respiratory: lungs were clear bilaterally without wheezing or crackles.   Cardiovascular:  Regular rate and rhythm, S1/S2, without murmur, rub or gallop.  There was no pedal edema.   GI:  abdomen was soft, flat, nontender, nondistended, without organomegaly.   Musculoskeletal: Strength symmetrical in the upper and lower extremities. Skin exam was without ecchymosis, petechiae.   Neuro exam was nonfocal.  Patient was alert and oriented.  Attention was good.   Language was appropriate.  Mood was normal without depression.  Speech was not pressured.  Thought content was not tangential.    LABS:  Lab Results  Component Value Date   WBC 12.2 (H) 11/20/2020   HGB 9.4 (L) 11/20/2020   HCT 28.7 (L) 11/20/2020   PLT 291 11/20/2020   GLUCOSE 91 11/20/2020   ALT 10  11/19/2020   AST 17 11/19/2020   NA 136 11/20/2020   K 3.6 11/20/2020   CL 101 11/20/2020   CREATININE 9.82 (H) 11/20/2020   BUN 73 (H) 11/20/2020   CO2 21 (L) 11/20/2020   INR 1.5 (H) 11/13/2020    CT ABDOMEN PELVIS WO CONTRAST  Result Date: 11/13/2020 CLINICAL DATA:  Diarrhea swelling over proximal right clavicle EXAM: CT CHEST, ABDOMEN AND PELVIS WITHOUT CONTRAST TECHNIQUE: Multidetector CT imaging of the chest, abdomen and pelvis was performed following the standard protocol without IV contrast. COMPARISON:  CT 06/25/2012 FINDINGS: CT CHEST FINDINGS Cardiovascular: Limited evaluation without intravenous contrast. Aorta is nonaneurysmal. Normal cardiac size. No pericardial effusion. Mediastinum/Nodes: Midline trachea. No thyroid mass. No suspicious nodes. Esophagus normal Lungs/Pleura: Lungs are clear. No pleural effusion or pneumothorax. Musculoskeletal: Mild edema and soft tissue swelling/thickening centered at the right sternoclavicular joint. Irregular lucency within the head of the right clavicle. Similar but less extensive changes involving head of left clavicle. Focal heterogeneous lucency within the right first rib, series 4, image 34. Bones appear diffusely dense CT ABDOMEN PELVIS FINDINGS Hepatobiliary: No focal liver abnormality is seen. No gallstones, gallbladder wall thickening, or biliary dilatation. Pancreas: Unremarkable. No pancreatic ductal dilatation or surrounding inflammatory changes. Spleen: Normal in size without focal abnormality. Adrenals/Urinary Tract: Adrenal glands are normal. Status post right nephrectomy. Severe left-sided hydronephrosis and hydroureter with tortuous left ureter dilated down to the level of the bladder. Dilated right ureteral remnant with cystic enlargement of the distal right ureter. Bladder is unremarkable. Stomach/Bowel: Stomach nonenlarged. No dilated small bowel. Negative appendix. No acute bowel wall thickening Vascular/Lymphatic: Nonaneurysmal  aorta.  No suspicious nodes Reproductive: Prostate is unremarkable. Other: Negative for pelvic effusion or free air Musculoskeletal: Bones appear diffusely increased in density suggesting metabolic bone disease, possibly due to chronic kidney disease. 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 Interval resorptive changes at bilateral SI joints with some sclerosis. No inflammatory changes by CT. IMPRESSION: 1. Edema and soft tissue thickening centered the right sternoclavicular  joint with heterogeneous lucency involving the head of the right clavicle. Similar changes involving head of left clavicle though less extensive. Findings could be secondary to osteomyelitis and infection with bone tumor an additional consideration. Further evaluation with MRI could be considered. Additional heterogeneous focal indeterminate lucent lesion in the right first rib posteriorly. 2. Status post right nephrectomy. Severe left-sided hydronephrosis and hydroureter down to the level of the bladder. Interval cortical atrophy of left kidney. There is right ureteral remnant which is also dilated with cystic change distally. Electronically Signed   By: Donavan Foil M.D.   On: 11/13/2020 23:11   CT chest without contrast  Result Date: 11/20/2020 CLINICAL DATA:  Chest wall mass or lump (Ped 0-18y) EXAM: CT CHEST WITHOUT CONTRAST TECHNIQUE: Multidetector CT imaging of the chest was performed following the standard protocol without IV contrast. COMPARISON:  MRI 11/18/2020, chest CT 02/22/2021. FINDINGS: Cardiovascular: Normal cardiac size.No pericardial disease.Normal size main and branch pulmonary arteries.The thoracic aorta is unremarkable. There is a right IJ catheter with tip within the right atrium. Mediastinum/Nodes: No lymphadenopathy.The thyroid is unremarkable.Esophagus is unremarkable.The trachea is unremarkable. Lungs/Pleura: No focal airspace consolidation.No suspicious pulmonary nodules or masses.No pleural effusion.No  pneumothorax. Upper Abdomen: No acute abnormality. Partially visualized left renal hydronephrosis as described on prior CT abdomen and pelvis. Musculoskeletal: Bony erosive/destructive changes involving the medial clavicle at the sternoclavicular joint.Minimal right Saxton joint distension. This is similar appearance to prior chest CT on August 11th. Unchanged appearance of the left medial clavicle. Unchanged mild lucency in the right posterior first rib which has a benign appearance. Right-sided os acromiale noted. 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 IMPRESSION: Unchanged erosive/destructive changes of the right medial clavicle with adjacent soft tissue thickening and edema at the sternoclavicular joint. Findings could reflect inflammatory/crystalline arthropathy or septic [place] joint with osteomyelitis of the right medial clavicle. Amyloid arthropathy is also possible given history of ESRD and presence of renal osteodystrophy. Unchanged CT appearance of the left medial clavicle, which demonstrated marrow edema on prior MRI. Electronically Signed   By: Maurine Simmering M.D.   On: 11/20/2020 16:04   CT Chest Wo Contrast  Result Date: 11/13/2020 CLINICAL DATA:  Diarrhea swelling over proximal right clavicle EXAM: CT CHEST, ABDOMEN AND PELVIS WITHOUT CONTRAST TECHNIQUE: Multidetector CT imaging of the chest, abdomen and pelvis was performed following the standard protocol without IV contrast. COMPARISON:  CT 06/25/2012 FINDINGS: CT CHEST FINDINGS Cardiovascular: Limited evaluation without intravenous contrast. Aorta is nonaneurysmal. Normal cardiac size. No pericardial effusion. Mediastinum/Nodes: Midline trachea. No thyroid mass. No suspicious nodes. Esophagus normal Lungs/Pleura: Lungs are clear. No pleural effusion or pneumothorax. Musculoskeletal: Mild edema and soft tissue swelling/thickening centered at the right sternoclavicular joint. Irregular lucency within the head of the right clavicle.  Similar but less extensive changes involving head of left clavicle. Focal heterogeneous lucency within the right first rib, series 4, image 34. Bones appear diffusely dense CT ABDOMEN PELVIS FINDINGS Hepatobiliary: No focal liver abnormality is seen. No gallstones, gallbladder wall thickening, or biliary dilatation. Pancreas: Unremarkable. No pancreatic ductal dilatation or surrounding inflammatory changes. Spleen: Normal in size without focal abnormality. Adrenals/Urinary Tract: Adrenal glands are normal. Status post right nephrectomy. Severe left-sided hydronephrosis and hydroureter with tortuous left ureter dilated down to the level of the bladder. Dilated right ureteral remnant with cystic enlargement of the distal right ureter. Bladder is unremarkable. Stomach/Bowel: Stomach nonenlarged. No dilated small bowel. Negative appendix. No acute bowel wall thickening Vascular/Lymphatic: Nonaneurysmal aorta.  No suspicious nodes Reproductive:  Prostate is unremarkable. Other: Negative for pelvic effusion or free air Musculoskeletal: Bones appear diffusely increased in density suggesting metabolic bone disease, possibly due to chronic kidney disease. 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 Interval resorptive changes at bilateral SI joints with some sclerosis. No inflammatory changes by CT. IMPRESSION: 1. Edema and soft tissue thickening centered the right sternoclavicular joint with heterogeneous lucency involving the head of the right clavicle. Similar changes involving head of left clavicle though less extensive. Findings could be secondary to osteomyelitis and infection with bone tumor an additional consideration. Further evaluation with MRI could be considered. Additional heterogeneous focal indeterminate lucent lesion in the right first rib posteriorly. 2. Status post right nephrectomy. Severe left-sided hydronephrosis and hydroureter down to the level of the bladder. Interval cortical atrophy of left  kidney. There is right ureteral remnant which is also dilated with cystic change distally. Electronically Signed   By: Donavan Foil M.D.   On: 11/13/2020 23:11   MR STERNUM WO CONTRAST  Result Date: 11/18/2020 CLINICAL DATA:  Abnormal right sternoclavicular joint on CT. EXAM: MR CHEST WITHOUT CONTRAST TECHNIQUE: Multiplanar, multisequence MR imaging of the sternum was performed. No intravenous contrast was administered. COMPARISON:  CT chest dated November 13, 2020. FINDINGS: Bones/Joint/Cartilage Fairly symmetric mild marrow edema in both clavicular heads with mild surrounding soft tissue swelling. No fracture or dislocation. Small bilateral sternoclavicular joint effusions. Ligaments Sternoclavicular ligaments are intact. Muscles and Tendons Intact.  Mild muscle edema around the right sternoclavicular joint. Soft tissue No fluid collection or hematoma.  No soft tissue mass. IMPRESSION: 1. Fairly symmetric marrow edema in both clavicular heads with mild surrounding soft tissue swelling. While nonspecific, the relatively symmetric appearance argues against infection and would more favor an inflammatory or crystal arthropathy. However, osteomyelitis of the right clavicular head remains in the differential given unilateral erosive appearance on CT. No abscess. Electronically Signed   By: Titus Dubin M.D.   On: 11/18/2020 09:56   IR Fluoro Guide CV Line Right  Result Date: 11/18/2020 INDICATION: End-stage renal disease EXAM: Ultrasound fluoroscopy guided tunneled IJ hemodialysis catheter placement MEDICATIONS: Ancef 2 gm IV; The antibiotic was administered within an appropriate time interval prior to skin puncture. ANESTHESIA/SEDATION: Moderate (conscious) sedation was employed during this procedure. A total of Versed 2 mg and Fentanyl 100 mcg was administered intravenously. Moderate Sedation Time: 9 minutes. The patient's level of consciousness and vital signs were monitored continuously by radiology  nursing throughout the procedure under my direct supervision. FLUOROSCOPY TIME:  Fluoroscopy Time: 0 minutes 48 seconds (3 mGy). COMPLICATIONS: None immediate. PROCEDURE: Informed written consent was obtained from the patient after a thorough discussion of the procedural risks, benefits and alternatives. All questions were addressed. Maximal Sterile Barrier Technique was utilized including caps, mask, sterile gowns, sterile gloves, sterile drape, hand hygiene and skin antiseptic. A timeout was performed prior to the initiation of the procedure. The right internal jugular vein was evaluated with ultrasound and shown to be patent. A permanent ultrasound image was obtained and placed in the patient's medical record. Using sterile gel and a sterile probe cover, the right internal jugular vein was entered with a 21 ga needle during real time ultrasound guidance. 0.018 inch guidewire placed and 21 ga needle exchanged for transitional dilator set. Utilizing fluoroscopy, 0.035 inch guidewire advanced through the needle without difficulty. Seriel dilation was performed and peel-away sheath was placed. Attention then turned to the right anterior upper chest. Following local lidocaine administration, the hemodialysis catheter was tunneled  from the chest wall to the venotomy site. The catheter was inserted through the peel-away sheath. The tip of the catheter was positioned within the right atrium using fluoroscopic guidance. All lumens of the catheter aspirated and flushed well. The dialysis lumens were locked with Heparin. The catheter was secured to the skin with suture. The insertion site was covered with a Biopatch and sterile dressing. IMPRESSION: 19 cm tunneled right IJ hemodialysis catheter ready for use. Electronically Signed   By: Miachel Roux M.D.   On: 11/18/2020 15:42   IR US Guide Vasc Access Right  Result Date: 11/18/2020 INDICATION: End-stage renal disease EXAM: Ultrasound fluoroscopy guided tunneled IJ  hemodialysis catheter placement MEDICATIONS: Ancef 2 gm IV; The antibiotic was administered within an appropriate time interval prior to skin puncture. ANESTHESIA/SEDATION: Moderate (conscious) sedation was employed during this procedure. A total of Versed 2 mg and Fentanyl 100 mcg was administered intravenously. Moderate Sedation Time: 9 minutes. The patient's level of consciousness and vital signs were monitored continuously by radiology nursing throughout the procedure under my direct supervision. FLUOROSCOPY TIME:  Fluoroscopy Time: 0 minutes 48 seconds (3 mGy). COMPLICATIONS: None immediate. PROCEDURE: Informed written consent was obtained from the patient after a thorough discussion of the procedural risks, benefits and alternatives. All questions were addressed. Maximal Sterile Barrier Technique was utilized including caps, mask, sterile gowns, sterile gloves, sterile drape, hand hygiene and skin antiseptic. A timeout was performed prior to the initiation of the procedure. The right internal jugular vein was evaluated with ultrasound and shown to be patent. A permanent ultrasound image was obtained and placed in the patient's medical record. Using sterile gel and a sterile probe cover, the right internal jugular vein was entered with a 21 ga needle during real time ultrasound guidance. 0.018 inch guidewire placed and 21 ga needle exchanged for transitional dilator set. Utilizing fluoroscopy, 0.035 inch guidewire advanced through the needle without difficulty. Seriel dilation was performed and peel-away sheath was placed. Attention then turned to the right anterior upper chest. Following local lidocaine administration, the hemodialysis catheter was tunneled from the chest wall to the venotomy site. The catheter was inserted through the peel-away sheath. The tip of the catheter was positioned within the right atrium using fluoroscopic guidance. All lumens of the catheter aspirated and flushed well. The dialysis  lumens were locked with Heparin. The catheter was secured to the skin with suture. The insertion site was covered with a Biopatch and sterile dressing. IMPRESSION: 19 cm tunneled right IJ hemodialysis catheter ready for use. Electronically Signed   By: Miachel Roux M.D.   On: 11/18/2020 15:42   DG Chest Portable 1 View  Result Date: 11/13/2020 CLINICAL DATA:  Evaluate proximal right clavicular head. EXAM: PORTABLE CHEST 1 VIEW COMPARISON:  None. FINDINGS: No gross abnormality of the proximal right clavicular head is evident within the limitations of a single portable frontal radiograph of the chest. Low lung volumes with streaky opacities favoring atelectatic change. No consolidative process. No pneumothorax or layering effusion. The cardiomediastinal contours are unremarkable. No acute osseous or soft tissue abnormality. Telemetry leads overlie the chest. IMPRESSION: No gross abnormality of the proximal right clavicular head though evaluation is limited on this single view portable chest radiograph. Could consider dedicated clavicular radiographs or cross-sectional imaging if there is persisting clinical concern. Electronically Signed   By: Lovena Le M.D.   On: 11/13/2020 22:36   VAS [place] LOWER EXTREMITY VENOUS (DVT)  Result Date: 11/15/2020  Lower Venous DVT Study Patient Name:  Micheal Bean  Date of Exam:   11/14/2020 Medical Rec #: [id number]      Accession #:    [id number] Date of Birth: 11-20-1985      Patient Gender: M Patient Age:   9 years Exam Location:  [facility] Procedure:      VAS [place] LOWER EXTREMITY VENOUS (DVT) Referring Phys: Sheppard Coil MELVIN --------------------------------------------------------------------------------  Indications: Elevated D-dimer.  Comparison Study: No previous exams Performing Technologist: Darlin Coco  Examination Guidelines: A complete evaluation includes B-mode imaging, spectral Doppler, color Doppler, and power Doppler as needed of all accessible portions  of each vessel. Bilateral testing is considered an integral part of a complete examination. Limited examinations for reoccurring indications may be performed as noted. The reflux portion of the exam is performed with the patient in reverse Trendelenburg.  +---------+---------------+---------+-----------+----------+--------------+ RIGHT    CompressibilityPhasicitySpontaneityPropertiesThrombus Aging +---------+---------------+---------+-----------+----------+--------------+ CFV      Full           Yes      Yes                                 +---------+---------------+---------+-----------+----------+--------------+ SFJ      Full                                                        +---------+---------------+---------+-----------+----------+--------------+ FV Prox  Full                                                        +---------+---------------+---------+-----------+----------+--------------+ FV Mid   Full                                                        +---------+---------------+---------+-----------+----------+--------------+ FV DistalFull                                                        +---------+---------------+---------+-----------+----------+--------------+ PFV      Full                                                        +---------+---------------+---------+-----------+----------+--------------+ POP      Full           Yes      Yes                                 +---------+---------------+---------+-----------+----------+--------------+ PTV      Full                                                        +---------+---------------+---------+-----------+----------+--------------+  PERO     Full                                                        +---------+---------------+---------+-----------+----------+--------------+ Gastroc  Full                                                         +---------+---------------+---------+-----------+----------+--------------+   +---------+---------------+---------+-----------+----------+--------------+ LEFT     CompressibilityPhasicitySpontaneityPropertiesThrombus Aging +---------+---------------+---------+-----------+----------+--------------+ CFV      Full           Yes      Yes                                 +---------+---------------+---------+-----------+----------+--------------+ SFJ      Full                                                        +---------+---------------+---------+-----------+----------+--------------+ FV Prox  Full                                                        +---------+---------------+---------+-----------+----------+--------------+ FV Mid   Full                                                        +---------+---------------+---------+-----------+----------+--------------+ FV DistalFull                                                        +---------+---------------+---------+-----------+----------+--------------+ PFV      Full                                                        +---------+---------------+---------+-----------+----------+--------------+ POP      Full           Yes      Yes                                 +---------+---------------+---------+-----------+----------+--------------+ PTV      Full                                                        +---------+---------------+---------+-----------+----------+--------------+  PERO     Full                                                        +---------+---------------+---------+-----------+----------+--------------+ Gastroc  Full                                                        +---------+---------------+---------+-----------+----------+--------------+     Summary: BILATERAL: - No evidence of deep vein thrombosis seen in the lower extremities, bilaterally. - No evidence of  superficial venous thrombosis in the lower extremities, bilaterally. -No evidence of popliteal cyst, bilaterally.   *See table(s) above for measurements and observations. Electronically signed by Harold Barban MD on 11/15/2020 at 1:27:53 PM.    Final      ASSESSMENT AND PLAN:  1.  Elevated serum light chains in the setting of acute renal failure 2.  Anemia secondary to CKD and iron deficiency 3.  Acute renal failure secondary to obstruction in the setting of solitary kidney 4.  Acute urinary retention 5.  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  We further discussed that this finding is difficult to interpret in the setting of renal insufficiency/renal failure as renal clearance of light chains is reduced in patients with renal insufficiency/renal failure.  Plasma cell disorder is thought to be unlikely in this patient, however, would recommend additional work-up to be sure were not overlooking anything.  Recommend obtaining UPEP/UIFE/light needs and would also recommend proceeding with a renal biopsy. -The patient's anemia is multifactorial and due to chronic kidney disease and iron deficiency.  His hemoglobin has improved after receiving 3 units PRBCs, IV ferric gluconate, and Aranesp per nephrology.  Monitor. -Renal function has improved compared to admission.  Nephrology following and is currently receiving hemodialysis with plans to transition to peritoneal dialysis as an outpatient. -Foley catheter currently in place per urology for acute urinary retention/hydronephrosis.  Management per urology. -Completed ceftriaxone and blood cultures were negative.  Now off antibiotics.  Thank you for this referral.  Mikey Bussing, DNP, AGPCNP-BC, AOCNP  ADDENDUM: I saw and examined Micheal Bean in the dialysis unit.  I agree with the above assessment.  I am more interested in the fact that he had  profound iron deficiency when he came in.  I do not  know if his stool has been checked.  There is no monoclonal spike on his SPEP.  I would think that if he had myeloma, he would have a light chain myeloma.  If that were the case, he would have 1 light chain elevated at the L1 markedly depressed.  He does not have this.  Rarely, rarely, really we see a biclonal light chain myeloma.  I would obtain a 24-hour urine on him.  I would have to see if there is any light chain monoclonal population of cells.  If not, then there is no way that he has any plasma cell dyscrasia.  Is very interesting as to why he would have renal failure.  I realize he only has 1 kidney.  Certainly, a kidney biopsy would be the  most aggressive form of intervention to find out what is going on.  I would think that this would be a last resort type of test.  I noted that his sed rate was incredibly elevated.  Again this would go along with some type of inflammatory condition.  He did have a bone survey done.  This showed lesions that were felt to be consistent with his renal osteodystrophy.  He did have a CT scan done of the body.  Again this was relatively unrevealing for anything that looks like a plasma cell dyscrasia.  He had an MRI done of the sternum.  This did not show any lesions that were suggestive of myeloma.  I agree that the anemia is clearly multifactorial and mostly related to his renal failure.  This is a very interesting scenario.  We will follow along.  I do appreciate the opportunity to of seeing Micheal Bean.  Lattie Haw, MD  Jeneen Rinks 1:5

## 2020-11-22 LAB — CBC
HCT: 29 % — ABNORMAL LOW (ref 39.0–52.0)
Hemoglobin: 9.2 g/dL — ABNORMAL LOW (ref 13.0–17.0)
MCH: 28.9 pg (ref 26.0–34.0)
MCHC: 31.7 g/dL (ref 30.0–36.0)
MCV: 91.2 fL (ref 80.0–100.0)
Platelets: 287 10*3/uL (ref 150–400)
RBC: 3.18 MIL/uL — ABNORMAL LOW (ref 4.22–5.81)
RDW: 13.8 % (ref 11.5–15.5)
WBC: 10.2 10*3/uL (ref 4.0–10.5)
nRBC: 0 % (ref 0.0–0.2)

## 2020-11-22 LAB — BASIC METABOLIC PANEL
Anion gap: 8 (ref 5–15)
BUN: 22 mg/dL — ABNORMAL HIGH (ref 6–20)
CO2: 26 mmol/L (ref 22–32)
Calcium: 9.1 mg/dL (ref 8.9–10.3)
Chloride: 100 mmol/L (ref 98–111)
Creatinine, Ser: 5.1 mg/dL — ABNORMAL HIGH (ref 0.61–1.24)
GFR, Estimated: 15 mL/min — ABNORMAL LOW (ref >60)
Glucose, Bld: 85 mg/dL (ref 70–99)
Potassium: 3.8 mmol/L (ref 3.5–5.1)
Sodium: 134 mmol/L — ABNORMAL LOW (ref 135–145)

## 2020-11-22 LAB — PHOSPHORUS: Phosphorus: 6.4 mg/dL — ABNORMAL HIGH (ref 2.5–4.6)

## 2020-11-22 LAB — MAGNESIUM: Magnesium: 2 mg/dL (ref 1.7–2.4)

## 2020-11-22 MED ORDER — DARBEPOETIN ALFA 60 MCG/0.3ML IJ SOSY
60.0000 ug | PREFILLED_SYRINGE | INTRAMUSCULAR | Status: DC
  Filled 2020-11-22: qty 0.3

## 2020-11-22 NOTE — Progress Notes (Signed)
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  He was started on hemodialysis via his right IJ [facility]; he intends to transition to peritoneal dialysis to maintain independence/mobility as an outpatient (will be followed by Dr.Foster).  Process initiated for outpatient dialysis unit placement (TCU). He has been tolerating dialysis well, s/p HD x 2, last HD on 8/19 and tolerated well. Plan for next HD on 8/22.    #  Anemia: Secondary to chronic kidney disease/iron deficiency.  Status post intravenous iron and started on ESA. Monitor Hb.   # Metabolic acidosis: Status post initial management with sodium bicarbonate drip and transitioned to oral sodium bicarbonate- now with dialysis.  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 Per primary team.  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 Renal diet.   # Acute urinary retention: Urology is following for trial of void and possible urodynamics study as outpatient.  Subjective:  No new event, status post second dialysis yesterday and tolerated well.  Denies nausea vomiting chest pain shortness of breath.  Objective Vital signs in last 24 hours: Vitals:   11/21/20 1857 11/21/20 2051 11/22/20 0514 11/22/20 0922  BP: 132/83 130/78 125/80 134/69  Pulse: 82 81 72 82  Resp: _0 Temp: 98.5 F (36.9 C) 98.6 F (37 C) 98.2 F (36.8 C) 98.3 F (36.8 C)  TempSrc: Oral Oral Oral Oral  SpO2: 98% 98% 99% 95%  Weight: 78.5 kg     Height:       Weight change: 0 kg  Intake/Output Summary (Last 24 hours) at  11/22/2020 1101 Last data filed at 11/22/2020 0800 Gross per 24 hour  Intake 1120 ml  Output 875 ml  Net 245 ml        Labs: Basic Metabolic Panel: Recent Labs  Lab 11/20/20 0717 11/21/20 1606 11/22/20 0320  NA 136 137 134*  K 3.6 4.0 3.8  CL 101 100 100  CO2 21* 24 26  GLUCOSE 91 86 85  BUN 73* 49* 22*  CREATININE 9.82* 7.98* 5.10*  CALCIUM 9.2 9.0 9.1  PHOS 8.6* 7.2* 6.4*    Liver Function Tests: Recent Labs  Lab 11/16/20 0639 11/17/20 0341 11/19/20 0319 11/20/20 0717 11/21/20 1606  AST 8* 10* 17  --   --   ALT _1 --   --   ALKPHOS 107 106 118  --   --   BILITOT 0.9 0.6 0.6  --   --   PROT 6.8 6.8 7.0  --   --   ALBUMIN 2.4* 2.3*  2.4* 2.6* 2.6* 2.7*    No results for input(s): LIPASE, AMYLASE in the last 168 hours. No results for input(s): AMMONIA in the last 168 hours. CBC: Recent Labs  Lab 11/16/20 0639 11/17/20 0341 11/18/20 0953 11/19/20 0319 11/20/20 0717 11/21/20 1606 11/22/20 0320  WBC 19.8* 16.3* 12.1* 11.2* 12.2* 12.8* 10.2  NEUTROABS 16.5* 13.7*  --  8.7*  --   --   --   HGB 8.8* 8.7* 8.6* 9.0* 9.4* 8.9* 9.2*  HCT 26.2* 26.1* 26.3* 27.7* 28.7* 29.4* 29.0*  MCV 85.9 86.1 88.6 88.2 90.0 93.9 91.2  PLT 269 275 274 286 291  318 287    Cardiac Enzymes: No results for input(s): CKTOTAL, CKMB, CKMBINDEX, TROPONINI in the last 168 hours.  CBG: No results for input(s): GLUCAP in the last 168 hours.  Iron Studies: No results for input(s): IRON, TIBC, TRANSFERRIN, FERRITIN in the last 72 hours. Studies/Results: CT chest without contrast  Result Date: 11/20/2020 CLINICAL DATA:  Chest wall mass or lump (Ped 0-18y) EXAM: CT CHEST WITHOUT CONTRAST TECHNIQUE: Multidetector CT imaging of the chest was performed following the standard protocol without IV contrast. COMPARISON:  MRI 11/18/2020, chest CT 02/22/2021. FINDINGS: Cardiovascular: Normal cardiac size.No pericardial disease.Normal size main and branch pulmonary arteries.The thoracic  aorta is unremarkable. There is a right IJ catheter with tip within the right atrium. Mediastinum/Nodes: No lymphadenopathy.The thyroid is unremarkable.Esophagus is unremarkable.The trachea is unremarkable. Lungs/Pleura: No focal airspace consolidation.No suspicious pulmonary nodules or masses.No pleural effusion.No pneumothorax. Upper Abdomen: No acute abnormality. Partially visualized left renal hydronephrosis as described on prior CT abdomen and pelvis. Musculoskeletal: Bony erosive/destructive changes involving the medial clavicle at the sternoclavicular joint.Minimal right [place] joint distension. This is similar appearance to prior chest CT on August 11th. Unchanged appearance of the left medial clavicle. Unchanged mild lucency in the right posterior first rib which has a benign appearance. Right-sided os acromiale noted. 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 IMPRESSION: Unchanged erosive/destructive changes of the right medial clavicle with adjacent soft tissue thickening and edema at the sternoclavicular joint. Findings could reflect inflammatory/crystalline arthropathy or septic [place] joint with osteomyelitis of the right medial clavicle. Amyloid arthropathy is also possible given history of ESRD and presence of renal osteodystrophy. Unchanged CT appearance of the left medial clavicle, which demonstrated marrow edema on prior MRI. Electronically Signed   By: Maurine Simmering M.D.   On: 11/20/2020 16:04   DG Bone Survey Met  Result Date: 11/21/2020 CLINICAL DATA:  Right chest wall mass, destructive right clavicular lesion on recent CT and MRI EXAM: METASTATIC BONE SURVEY COMPARISON:  11/13/2020, 11/18/2020, 11/20/2020 FINDINGS: Standard skeletal survey was performed. Lateral skull: Innumerable punctate lucencies are seen throughout the calvarium in a configuration consistent with salt and pepper appearance. No other destructive lesions. No acute bony abnormality. Upper extremities: Frontal  views of the bilateral upper extremities from the shoulders through the wrists are obtained. There is symmetrical resorption of the distal clavicles abutting the acromioclavicular joints, with normal appearance of the acromion process. No other acute or destructive bony abnormalities. Spine: Frontal and lateral views of the cervical, thoracic, and lumbar spine was performed. There are no acute or destructive bony abnormalities. Disc spaces are well preserved. Paraspinal soft tissues are unremarkable. High attenuation material overlying the central upper abdomen consistent with gastric contents. Chest: Frontal view of the chest demonstrates stable right internal jugular dialysis catheter. Cardiac silhouette is unremarkable. No airspace disease, effusion, or pneumothorax. No acute bony abnormalities. The lytic process involving the medial right clavicle seen on CT is less well appreciated by radiograph. Pelvis: Single frontal view of the pelvis demonstrates no acute bony abnormality. Symmetrical erosive changes are seen involving the bilateral sacroiliac joints. Joint spaces are otherwise well preserved. Lower extremities: Frontal views of the bilateral lower extremities from the hips through the ankles are obtained. There are small areas of subperiosteal bone resorption along the medial aspect of the proximal tibial metadiaphyseal junctions bilaterally. No other acute or destructive bony abnormalities. Joint spaces are well preserved. IMPRESSION: 1. 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 2. No acute fractures. Electronically Signed   By: Randa Ngo M.D.   On: 11/21/2020 17:27    Medications: Infusions:  sodium chloride      Scheduled Medications:  Chlorhexidine Gluconate Cloth  6 each Topical Q0600   darbepoetin  (ARANESP) injection - NON-DIALYSIS  40 mcg Subcutaneous Q Sun-1800   feeding supplement (NEPRO CARB STEADY)  237 mL Oral BID BM   lanthanum  1,000 mg Oral TID WC   multivitamin  1 tablet Oral QHS   sodium chloride flush  3 mL Intravenous Q12H   vitamin B-12  1,000 mcg Oral Daily    have reviewed scheduled and prn medications.  Physical Exam: General:NAD, able to lie flat comfortable Heart:RRR, s1s2 nl Lungs:clear b/l, no crackle Abdomen:soft, Non-tender, non-distended Extremities:No edema Dialysis Access: RIJ TDC   Brennley Curtice Tanna Furry 11/22/2020,11:01 AM  LOS: 8 days

## 2020-11-22 NOTE — Progress Notes (Signed)
Subjective/Chief Complaint:  1 - Urinary Retention - pt found to be in retention at presentation likely 2/2 partial stricture and intrinsic bladder dysfunction. Foley 11/14/20 placed but little meaningful renal recovery.  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 CT 11/2020 with severely / chronically hydronephrotic solitary left kidney. 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   Today "Micheal Bean" is stable. Remains on dialysis with plan to continue.    Objective: Vital signs in last 24 hours: Temp:  [98.2 F (36.8 C)-98.8 F (37.1 C)] 98.2 F (36.8 C) (08/20 0514) Pulse Rate:  [72-86] 72 (08/20 0514) Resp:  [17-20] 18 (08/20 0514) BP: (108-138)/(72-86) 125/80 (08/20 0514) SpO2:  [98 %-99 %] 99 % (08/20 0514) Weight:  [78.5 kg-78.6 kg] 78.5 kg (08/19 1857) Last BM Date: 11/21/20 (per patient)  Intake/Output from previous day: 08/19 0701 - 08/20 0700 In: 78 [P.O.:760] Out: 875 [Urine:875] Intake/Output this shift: Total I/O In: -  Out: 875 [Urine:875]  EXAM: NAD, AOx3, very pleasant HD catheter in Rt [place] location Non-labored breathign on RA SNTND Foley in place with medium yellow urine No c/c/e  Lab Results:  Recent Labs    11/21/20 1606 11/22/20 0320  WBC 12.8* 10.2  HGB 8.9* 9.2*  HCT 29.4* 29.0*  PLT 318 287   BMET Recent Labs    11/21/20 1606 11/22/20 0320  NA 137 134*  K 4.0 3.8  CL 100 100  CO2 24 26  GLUCOSE 86 85  BUN 49* 22*  CREATININE 7.98* 5.10*  CALCIUM 9.0 9.1   PT/INR No results for input(s): LABPROT, INR in the last 72 hours. ABG No results for input(s): PHART, HCO3 in the last 72 hours.  Invalid input(s): PCO2, PO2  Studies/Results: CT chest without contrast  Result Date: 11/20/2020 CLINICAL DATA:  Chest wall mass or lump (Ped 0-18y) EXAM: CT CHEST WITHOUT CONTRAST TECHNIQUE: Multidetector CT imaging of the chest was performed following the  standard protocol without IV contrast. COMPARISON:  MRI 11/18/2020, chest CT 02/22/2021. FINDINGS: Cardiovascular: Normal cardiac size.No pericardial disease.Normal size main and branch pulmonary arteries.The thoracic aorta is unremarkable. There is a right IJ catheter with tip within the right atrium. Mediastinum/Nodes: No lymphadenopathy.The thyroid is unremarkable.Esophagus is unremarkable.The trachea is unremarkable. Lungs/Pleura: No focal airspace consolidation.No suspicious pulmonary nodules or masses.No pleural effusion.No pneumothorax. Upper Abdomen: No acute abnormality. Partially visualized left renal hydronephrosis as described on prior CT abdomen and pelvis. Musculoskeletal: Bony erosive/destructive changes involving the medial clavicle at the sternoclavicular joint.Minimal right [place] joint distension. This is similar appearance to prior chest CT on August 11th. Unchanged appearance of the left medial clavicle. Unchanged mild lucency in the right posterior first rib which has a benign appearance. Right-sided os acromiale noted. 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 IMPRESSION: Unchanged erosive/destructive changes of the right medial clavicle with adjacent soft tissue thickening and edema at the sternoclavicular joint. Findings could reflect inflammatory/crystalline arthropathy or septic [place] joint with osteomyelitis of the right medial clavicle. Amyloid arthropathy is also possible given history of ESRD and presence of renal osteodystrophy. Unchanged CT appearance of the left medial clavicle, which demonstrated marrow edema on prior MRI. Electronically Signed   By: Maurine Simmering M.D.   On: 11/20/2020 16:04   DG Bone Survey Met  Result Date: 11/21/2020 CLINICAL DATA:  Right chest wall mass, destructive right clavicular lesion on recent CT and MRI EXAM: METASTATIC BONE SURVEY COMPARISON:  11/13/2020, 11/18/2020, 11/20/2020 FINDINGS: Standard skeletal survey was performed. Lateral  skull: Innumerable punctate lucencies are seen throughout the calvarium in a configuration consistent with salt and pepper appearance. No other destructive lesions. No acute bony abnormality. Upper extremities: Frontal views of the bilateral upper extremities from the shoulders through the wrists are obtained. There is symmetrical resorption of the distal clavicles abutting the acromioclavicular joints, with normal appearance of the acromion process. No other acute or destructive bony abnormalities. Spine: Frontal and lateral views of the cervical, thoracic, and lumbar spine was performed. There are no acute or destructive bony abnormalities. Disc spaces are well preserved. Paraspinal soft tissues are unremarkable. High attenuation material overlying the central upper abdomen consistent with gastric contents. Chest: Frontal view of the chest demonstrates stable right internal jugular dialysis catheter. Cardiac silhouette is unremarkable. No airspace disease, effusion, or pneumothorax. No acute bony abnormalities. The lytic process involving the medial right clavicle seen on CT is less well appreciated by radiograph. Pelvis: Single frontal view of the pelvis demonstrates no acute bony abnormality. Symmetrical erosive changes are seen involving the bilateral sacroiliac joints. Joint spaces are otherwise well preserved. Lower extremities: Frontal views of the bilateral lower extremities from the hips through the ankles are obtained. There are small areas of subperiosteal bone resorption along the medial aspect of the proximal tibial metadiaphyseal junctions bilaterally. No other acute or destructive bony abnormalities. Joint spaces are well preserved. IMPRESSION: 1. 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 2. No acute fractures.  Electronically Signed   By: Randa Ngo M.D.   On: 11/21/2020 17:27    Anti-infectives: Anti-infectives (From admission, onward)    Start     Dose/Rate Route Frequency Ordered Stop   11/18/20 1522  ceFAZolin (ANCEF) IVPB 2g/100 mL premix        over 30 Minutes Intravenous Continuous PRN 11/18/20 1532 11/18/20 1522   11/18/20 1512  ceFAZolin (ANCEF) 2-4 GM/100ML-% IVPB       Note to Pharmacy: [name]   : cabinet override      11/18/20 1512 11/19/20 0329   11/18/20 0000  ceFAZolin (ANCEF) IVPB 2g/100 mL premix  Status:  Discontinued        2 g 200 mL/hr over 30 Minutes Intravenous To Radiology 11/17/20 1014 11/17/20 1014   11/17/20 0000  ceFAZolin (ANCEF) IVPB 2g/100 mL premix  Status:  Discontinued        2 g 200 mL/hr over 30 Minutes Intravenous To Radiology 11/16/20 1400 11/17/20 0906   11/14/20 2100  DAPTOmycin (CUBICIN) 700 mg in sodium chloride 0.9 % IVPB  Status:  Discontinued        8 mg/kg  85.2 kg (Adjusted) 128 mL/hr over 30 Minutes Intravenous Every 48 hours 11/14/20 2006 11/17/20 1127   11/14/20 2045  cefTRIAXone (ROCEPHIN) 2 g in sodium chloride 0.9 % 100 mL IVPB  Status:  Discontinued        2 g 200 mL/hr over 30 Minutes Intravenous Every 24 hours 11/14/20 1948 11/17/20 1127   11/13/20 2230  cefTRIAXone (ROCEPHIN) 1 g in sodium chloride 0.9 % 100 mL IVPB  Status:  Discontinued        1 g 200 mL/hr over 30 Minutes Intravenous Every 24 hours 11/13/20 2223 11/14/20 1948       Assessment/Plan:  1 - Urinary Retention - likely due to partial stricture + intrinsic  dysfunciton. As he is now on hemodialysis, I feel foley can come out. Rec outpatinet FU with consideration of urodynamics to verify bladder function, this will help in transplant risk stratification.   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 He would likely be very  good transplant candidate and favored in match algorithms given his minimal comorbidity and young age.   Will follow PRN at this point. Please call anytime.   Alexis Frock 11/22/2020

## 2020-11-22 NOTE — Progress Notes (Signed)
PROGRESS NOTE    STPEHEN PETITJEAN  LTJ:[id number] DOB: Nov 13, 1985 DOA: 11/13/2020 PCP: Patient, No Pcp Per (Inactive)    Brief Narrative: This 35 years old male with PMH significant for hx. of right nephrectomy as an infant presented with constellation of intermittent symptoms including headache, lightheadedness and chest tightness for 3 to 4 days.  He has tried some fluids at home which seemed to help a little,  he has also tried a dose of his mother's amlodipine but did not seem to help.  He does reports losing 20 pounds in last 2 months. He was seen in urgent care and was sent to the ED for further evaluation.  Patient was admitted for AKI on CKD,  found to have left-sided hydronephrosis with possible UTI, severe anemia and leukocytosis.  Patient started on hemodialysis.  Plan is to arrange outpatient hemodialysis set up so patient can be discharged.  Assessment & Plan:   Principal Problem:   Acute renal failure (ARF) (HCC) Active Problems:   Acute lower UTI   Leukocytosis   Bone lesion   AKI (acute kidney injury) ([place])   Osteomyelitis (HCC)   AKI on CKD stage III: Progression of CKD stage III now End-stage renal disease requiring hemodialysis. Most recent creatinine was 2.0 in 2014 unclear what his recent baseline may have been. 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 He is found to have severe hydronephrosis of the remaining left kidney. Urology consulted, Foley inserted.  Patient may need cystoscopy/retrograde study if creatinine not improving Nephrology consulted, no renal recovery ,  Patient underwent tunneled hemodialysis catheter on 11/18/20 followed by hemodialysis. SPEP, UPEP, free light chains - elevated kappa/lamda light chain - may need to discuss with heme, follow SPEP - monoclonal protein not apparent.  Hematology consulted.  Recommended 24-hour urine collection for plasma cell dyscrasias. Patient underwent hemodialysis on 8/16,  8/18, next dialysis 8/20 Patient is interested in outpatient peritoneal dialysis to maintain independence /mobility as an outpatient( He will be followed by Dr. Royce Macadamia) Plan is to arrange outpatient hemodialysis set up so patient can be discharged. He will be a very good transplant candidate given his minimal comorbidity and young age.   Acute urinary retention: Patient was continued on Foley catheter, urology was reconsulted.  Voiding trial started. Recommended outpatient follow-up for consideration of urodynamics to verify bladder function.  Anion gap metabolic acidosis: Resolved with sodium bicarbonate infusion and now with dialysis.  Soft tissue thickening around right supraclavicular joint: Leukocytosis > Improving: There was a concern for UTI, but urine culture no growth. CT chest showed  heterogeneous lucency involving head of right clavicle concerning for infection/osteomyelitis. MRI [place] joint showed  fairly symmetric marrow edema in both clavicular heads with mild surrounding soft tissue swelling (argues against infection, favors inflammatory or crystal arthropathy) Completed ceftriaxone for UTI.  Blood cultures no growth to date. ID recommended holding off on antibiotics at this point. CT Sx states there is no obvious area of fluid collection requiring surgical drainage,  no need for surgical intervention at this point.  Elevated Inflammatory Markers ESR >140, CRP  Follow ANA, anti CCP, RF  Normocytic normochromic anemia: Could be multifactorial due to B12 deficiency,  anemia of chronic disease and iron deficiency. Found to have significant anemia 6.8, s/p 3 units PRBC Continue B12 and folate supplementation, iron supplementation. No history of black stools. Hb stable at 9.0 > 9.4   Elevated D Dimer Suspect related to inflammation.  Negative LE [place] for DVT Consider VQ scan, but no evidence of hypoxia, denies CP - suspect dimer related to other systemic issues, but low  threshold for further w/u.    Family requested transfer to [facility] earlier in hospitalization.  [facility] declined given family request.  Consider if medical need going further.  He can also follow outpatient as desired.   DVT prophylaxis: Heparin Code Status: Full code. Family Communication: Wife at bed side. Disposition Plan:    Status is: Inpatient  Remains inpatient appropriate because:Inpatient level of care appropriate due to severity of illness  Dispo: The patient is from: Home              Anticipated d/c is to: Home              Patient currently is not medically stable to d/c.   Difficult to place patient No  Consultants:  Nephrology ID [email or web address].  Procedures:  HD, TDC Antimicrobials:   Anti-infectives (From admission, onward)    Start     Dose/Rate Route Frequency Ordered Stop   11/18/20 1522  ceFAZolin (ANCEF) IVPB 2g/100 mL premix        over 30 Minutes Intravenous Continuous PRN 11/18/20 1532 11/18/20 1522   11/18/20 1512  ceFAZolin (ANCEF) 2-4 GM/100ML-% IVPB       Note to Pharmacy: [name]   : cabinet override      11/18/20 1512 11/19/20 0329   11/18/20 0000  ceFAZolin (ANCEF) IVPB 2g/100 mL premix  Status:  Discontinued        2 g 200 mL/hr over 30 Minutes Intravenous To Radiology 11/17/20 1014 11/17/20 1014   11/17/20 0000  ceFAZolin (ANCEF) IVPB 2g/100 mL premix  Status:  Discontinued        2 g 200 mL/hr over 30 Minutes Intravenous To Radiology 11/16/20 1400 11/17/20 0906   11/14/20 2100  DAPTOmycin (CUBICIN) 700 mg in sodium chloride 0.9 % IVPB  Status:  Discontinued        8 mg/kg  85.2 kg (Adjusted) 128 mL/hr over 30 Minutes Intravenous Every 48 hours 11/14/20 2006 11/17/20 1127   11/14/20 2045  cefTRIAXone (ROCEPHIN) 2 g in sodium chloride 0.9 % 100 mL IVPB  Status:  Discontinued        2 g 200 mL/hr over 30 Minutes Intravenous Every 24 hours 11/14/20 1948 11/17/20 1127   11/13/20 2230  cefTRIAXone (ROCEPHIN) 1 g in sodium chloride 0.9 % 100 mL IVPB   Status:  Discontinued        1 g 200 mL/hr over 30 Minutes Intravenous Every 24 hours 11/13/20 2223 11/14/20 1948        Subjective: Patient was seen and examined at bedside.  No overnight events. Patient seems much improved.  He reports discomfort with Foley catheter which was removed. Outpatient hemodialysis set up is in process.  Objective: Vitals:   11/21/20 1857 11/21/20 2051 11/22/20 0514 11/22/20 0922  BP: 132/83 130/78 125/80 134/69  Pulse: 82 81 72 82  Resp: $Remo'18 20 18 17  'KdDvA$ Temp: 98.5 F (36.9 C) 98.6 F (37 C) 98.2 F (36.8 C) 98.3 F (36.8 C)  TempSrc: Oral Oral Oral Oral  SpO2: 98% 98% 99% 95%  Weight: 78.5 kg     Height:        Intake/Output Summary (Last 24 hours) at 11/22/2020 1047 Last data filed at 11/22/2020 0800 Gross per 24 hour  Intake 1120 ml  Output 875 ml  Net 245 ml   Filed  Weights   11/20/20 2120 11/21/20 1545 11/21/20 1857  Weight: 78.6 kg 78.6 kg 78.5 kg    Examination:  General exam: Appears comfortable, not in any acute distress.  Right IJ catheter noted. Respiratory system: Clear to auscultation bilaterally, no wheezing, no crackles. Cardiovascular system: S1-S2 heard, regular rate and rhythm, no murmur Gastrointestinal system: Abdomen is soft, nontender, nondistended, BS positive Central nervous system: Alert and oriented x 3, no focal neurological deficits. Extremities: No edema, no cyanosis, no clubbing. Skin: No rash. Psychiatry: Mood and affect appropriate.    Data Reviewed: I have personally reviewed following labs and imaging studies  CBC: Recent Labs  Lab 11/16/20 0639 11/17/20 0341 11/18/20 0953 11/19/20 0319 11/20/20 0717 11/21/20 1606 11/22/20 0320  WBC 19.8* 16.3* 12.1* 11.2* 12.2* 12.8* 10.2  NEUTROABS 16.5* 13.7*  --  8.7*  --   --   --   HGB 8.8* 8.7* 8.6* 9.0* 9.4* 8.9* 9.2*  HCT 26.2* 26.1* 26.3* 27.7* 28.7* 29.4* 29.0*  MCV 85.9 86.1 88.6 88.2 90.0 93.9 91.2  PLT 269 275 274 286 291 318 370   Basic  Metabolic Panel: Recent Labs  Lab 11/16/20 0639 11/17/20 0341 11/18/20 0953 11/19/20 0319 11/20/20 0717 11/21/20 1606 11/22/20 0320  NA 137   < > 138 138 136 137 134*  K 3.7   < > 3.7 3.5 3.6 4.0 3.8  CL 102   < > 102 102 101 100 100  CO2 16*   < > 17* 23 21* 24 26  GLUCOSE 86   < > 83 83 91 86 85  BUN 117*   < > 115* 70* 73* 49* 22*  CREATININE 11.90*   < > 11.87* 8.34* 9.82* 7.98* 5.10*  CALCIUM 9.2   < > 9.2 9.3 9.2 9.0 9.1  MG 1.9  --  1.9 1.9  --   --  2.0  PHOS 11.3*   < > 9.5* 7.4* 8.6* 7.2* 6.4*   < > = values in this interval not displayed.   GFR: Estimated Creatinine Clearance: 23.1 mL/min (A) (by C-G formula based on SCr of 5.1 mg/dL (H)). Liver Function Tests: Recent Labs  Lab 11/16/20 0639 11/17/20 0341 11/19/20 0319 11/20/20 0717 11/21/20 1606  AST 8* 10* 17  --   --   ALT $Re'9 8 10  'Svi$ --   --   ALKPHOS 107 106 118  --   --   BILITOT 0.9 0.6 0.6  --   --   PROT 6.8 6.8 7.0  --   --   ALBUMIN 2.4* 2.3*  2.4* 2.6* 2.6* 2.7*   No results for input(s): LIPASE, AMYLASE in the last 168 hours. No results for input(s): AMMONIA in the last 168 hours. Coagulation Profile: No results for input(s): INR, PROTIME in the last 168 hours.  Cardiac Enzymes: No results for input(s): CKTOTAL, CKMB, CKMBINDEX, TROPONINI in the last 168 hours.  BNP (last 3 results) No results for input(s): PROBNP in the last 8760 hours. HbA1C: No results for input(s): HGBA1C in the last 72 hours. CBG: No results for input(s): GLUCAP in the last 168 hours. Lipid Profile: No results for input(s): CHOL, HDL, LDLCALC, TRIG, CHOLHDL, LDLDIRECT in the last 72 hours. Thyroid Function Tests: No results for input(s): TSH, T4TOTAL, FREET4, T3FREE, THYROIDAB in the last 72 hours. Anemia Panel: Recent Labs    11/21/20 1606  RETICCTPCT 2.5   Sepsis Labs: No results for input(s): PROCALCITON, LATICACIDVEN in the last 168 hours.  Recent Results (from  the past 240 hour(s))  Resp Panel by RT-PCR  (Flu A&B, Covid) Nasopharyngeal Swab     Status: None   Collection Time: 11/13/20  7:30 PM   Specimen: Nasopharyngeal Swab; Nasopharyngeal(NP) swabs in vial transport medium  Result Value Ref Range Status   SARS Coronavirus 2 by RT PCR NEGATIVE NEGATIVE Final    Comment: (NOTE) SARS-CoV-2 target nucleic acids are NOT DETECTED.  The SARS-CoV-2 RNA is generally detectable in upper respiratory specimens during the acute phase of infection. The lowest concentration of SARS-CoV-2 viral copies this assay can detect is 138 copies/mL. A negative result does not preclude SARS-Cov-2 infection and should not be used as the sole basis for treatment or other patient management decisions. A negative result may occur with  improper specimen collection/handling, submission of specimen other than nasopharyngeal swab, presence of viral mutation(s) within the areas targeted by this assay, and inadequate number of viral copies(<138 copies/mL). A negative result must be combined with clinical observations, patient history, and epidemiological information. The expected result is Negative.  Fact Sheet for Patients:  EntrepreneurPulse.com.au  Fact Sheet for Healthcare Providers:  IncredibleEmployment.be  This test is no t yet approved or cleared by the [place] FDA and  has been authorized for detection and/or diagnosis of SARS-CoV-2 by FDA under an Emergency Use Authorization (EUA). This EUA will remain  in effect (meaning this test can be used) for the duration of the COVID-19 declaration under Section 564(b)(1) of the Act, 21 U.S.C.section 360bbb-3(b)(1), unless the authorization is terminated  or revoked sooner.       Influenza A by PCR NEGATIVE NEGATIVE Final   Influenza B by PCR NEGATIVE NEGATIVE Final    Comment: (NOTE) The Xpert Xpress SARS-CoV-2/FLU/RSV plus assay is intended as an aid in the diagnosis of influenza from Nasopharyngeal swab specimens  and should not be used as a sole basis for treatment. Nasal washings and aspirates are unacceptable for Xpert Xpress SARS-CoV-2/FLU/RSV testing.  Fact Sheet for Patients: EntrepreneurPulse.com.au  Fact Sheet for Healthcare Providers: IncredibleEmployment.be  This test is not yet approved or cleared by the [place] FDA and has been authorized for detection and/or diagnosis of SARS-CoV-2 by FDA under an Emergency Use Authorization (EUA). This EUA will remain in effect (meaning this test can be used) for the duration of the COVID-19 declaration under Section 564(b)(1) of the Act, 21 U.S.C. section 360bbb-3(b)(1), unless the authorization is terminated or revoked.  Performed at [facility], [place] [address] [place], [place] [address]   Urine Culture     Status: None   Collection Time: 11/13/20 10:23 PM   Specimen: Urine, Clean Catch  Result Value Ref Range Status   Specimen Description URINE, CLEAN CATCH  Final   Special Requests NONE  Final   Culture   Final    NO GROWTH Performed at [facility], [place] [address] [place], [place] [address]    Report Status 11/15/2020 FINAL  Final  Culture, blood (routine x 2)     Status: None   Collection Time: 11/14/20  9:19 AM   Specimen: BLOOD RIGHT FOREARM  Result Value Ref Range Status   Specimen Description BLOOD RIGHT FOREARM  Final   Special Requests   Final    BOTTLES DRAWN AEROBIC AND ANAEROBIC Blood Culture results may not be optimal due to an excessive volume of blood received in culture bottles   Culture   Final    NO GROWTH 5 DAYS Performed at [facility]  Lab, [address] [place],  [address]    Report Status 11/19/2020 FINAL  Final  Culture, blood (Routine X 2) w Reflex to ID Panel     Status: None   Collection Time: 11/14/20 11:20 PM   Specimen: BLOOD LEFT FOREARM  Result Value Ref Range Status   Specimen Description BLOOD LEFT FOREARM  Final   Special  Requests   Final    BOTTLES DRAWN AEROBIC ONLY Blood Culture adequate volume   Culture   Final    NO GROWTH 5 DAYS Performed at [facility], [place] [address] [place],  [address]    Report Status 11/20/2020 FINAL  Final    Radiology Studies: CT chest without contrast  Result Date: 11/20/2020 CLINICAL DATA:  Chest wall mass or lump (Ped 0-18y) EXAM: CT CHEST WITHOUT CONTRAST TECHNIQUE: Multidetector CT imaging of the chest was performed following the standard protocol without IV contrast. COMPARISON:  MRI 11/18/2020, chest CT 02/22/2021. FINDINGS: Cardiovascular: Normal cardiac size.No pericardial disease.Normal size main and branch pulmonary arteries.The thoracic aorta is unremarkable. There is a right IJ catheter with tip within the right atrium. Mediastinum/Nodes: No lymphadenopathy.The thyroid is unremarkable.Esophagus is unremarkable.The trachea is unremarkable. Lungs/Pleura: No focal airspace consolidation.No suspicious pulmonary nodules or masses.No pleural effusion.No pneumothorax. Upper Abdomen: No acute abnormality. Partially visualized left renal hydronephrosis as described on prior CT abdomen and pelvis. Musculoskeletal: Bony erosive/destructive changes involving the medial clavicle at the sternoclavicular joint.Minimal right [place] joint distension. This is similar appearance to prior chest CT on August 11th. Unchanged appearance of the left medial clavicle. Unchanged mild lucency in the right posterior first rib which has a benign appearance. Right-sided os acromiale noted. 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 IMPRESSION: Unchanged erosive/destructive changes of the right medial clavicle with adjacent soft tissue thickening and edema at the sternoclavicular joint. Findings could reflect inflammatory/crystalline arthropathy or septic [place] joint with osteomyelitis of the right medial clavicle. Amyloid arthropathy is also possible given history of ESRD and  presence of renal osteodystrophy. Unchanged CT appearance of the left medial clavicle, which demonstrated marrow edema on prior MRI. Electronically Signed   By: Maurine Simmering M.D.   On: 11/20/2020 16:04   DG Bone Survey Met  Result Date: 11/21/2020 CLINICAL DATA:  Right chest wall mass, destructive right clavicular lesion on recent CT and MRI EXAM: METASTATIC BONE SURVEY COMPARISON:  11/13/2020, 11/18/2020, 11/20/2020 FINDINGS: Standard skeletal survey was performed. Lateral skull: Innumerable punctate lucencies are seen throughout the calvarium in a configuration consistent with salt and pepper appearance. No other destructive lesions. No acute bony abnormality. Upper extremities: Frontal views of the bilateral upper extremities from the shoulders through the wrists are obtained. There is symmetrical resorption of the distal clavicles abutting the acromioclavicular joints, with normal appearance of the acromion process. No other acute or destructive bony abnormalities. Spine: Frontal and lateral views of the cervical, thoracic, and lumbar spine was performed. There are no acute or destructive bony abnormalities. Disc spaces are well preserved. Paraspinal soft tissues are unremarkable. High attenuation material overlying the central upper abdomen consistent with gastric contents. Chest: Frontal view of the chest demonstrates stable right internal jugular dialysis catheter. Cardiac silhouette is unremarkable. No airspace disease, effusion, or pneumothorax. No acute bony abnormalities. The lytic process involving the medial right clavicle seen on CT is less well appreciated by radiograph. Pelvis: Single frontal view of the pelvis demonstrates no acute bony abnormality. Symmetrical erosive changes are seen involving the bilateral sacroiliac joints. Joint spaces  are otherwise well preserved. Lower extremities: Frontal views of the bilateral lower extremities from the hips through the ankles are obtained. There are  small areas of subperiosteal bone resorption along the medial aspect of the proximal tibial metadiaphyseal junctions bilaterally. No other acute or destructive bony abnormalities. Joint spaces are well preserved. IMPRESSION: 1. 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 2. No acute fractures. Electronically Signed   By: Randa Ngo M.D.   On: 11/21/2020 17:27     Scheduled Meds:  Chlorhexidine Gluconate Cloth  6 each Topical Q0600   darbepoetin (ARANESP) injection - NON-DIALYSIS  40 mcg Subcutaneous Q Sun-1800   feeding supplement (NEPRO CARB STEADY)  237 mL Oral BID BM   lanthanum  1,000 mg Oral TID WC   multivitamin  1 tablet Oral QHS   sodium chloride flush  3 mL Intravenous Q12H   vitamin B-12  1,000 mcg Oral Daily   Continuous Infusions:  sodium chloride       LOS: 8 days    Time spent: 25 mins.    Shawna Clamp, MD [facility]   If 7PM-7AM, please contact night-coverage

## 2020-11-23 MED ORDER — CHLORHEXIDINE GLUCONATE CLOTH 2 % EX PADS: 6.0000 | MEDICATED_PAD | Freq: Every day | CUTANEOUS | Status: DC

## 2020-11-23 NOTE — Progress Notes (Signed)
[facility] NEPHROLOGY PROGRESS NOTE  Assessment/ Plan:  # Progression of chronic kidney disease stage V now end-stage renal disease: Likely progression partly from obstruction in the setting of a solitary kidney and renal function without significant recovery after Foley placement.  He was started on hemodialysis via his right IJ [facility]; he intends to transition to peritoneal dialysis to maintain independence/mobility as an outpatient (will be followed by Dr.Foster).  Process initiated for outpatient dialysis unit placement (TCU). He has been tolerating dialysis well, s/p HD x 2, last HD on 8/19 and tolerated well. Plan for next HD on 8/22.    #  Anemia: Secondary to chronic kidney disease/iron deficiency.  Status post intravenous iron and started on ESA. Monitor Hb.   # Metabolic acidosis: Status post initial management with sodium bicarbonate drip and transitioned to oral sodium bicarbonate- now with dialysis.  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 Per primary team.  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 Renal diet.   # Acute urinary retention: dc foley cath by urologist, urodynamics study as outpatient.  Subjective:  No new event, denies nausea, vomiting, chest pain, shortness of breath.  Objective Vital signs in last 24 hours: Vitals:   11/22/20 0922 11/22/20 1700 11/22/20 2057 11/23/20 0920  BP: 134/69 126/82 131/82 (!) 156/87  Pulse: 82 80 82 96  Resp: _0 Temp: 98.3 F (36.8 C) 98.2 F (36.8 C) 98 F (36.7 C) 98.6 F (37 C)  TempSrc: Oral Oral Oral Oral  SpO2: 95% 99% 100% 99%  Weight:      Height:       Weight change:   Intake/Output Summary (Last 24 hours) at 11/23/2020 1247 Last data filed at 11/23/2020 0122 Gross per 24 hour  Intake 560 ml   Output 450 ml  Net 110 ml        Labs: Basic Metabolic Panel: Recent Labs  Lab 11/20/20 0717 11/21/20 1606 11/22/20 0320  NA 136 137 134*  K 3.6 4.0 3.8  CL 101 100 100  CO2 21* 24 26  GLUCOSE 91 86 85  BUN 73* 49* 22*  CREATININE 9.82* 7.98* 5.10*  CALCIUM 9.2 9.0 9.1  PHOS 8.6* 7.2* 6.4*    Liver Function Tests: Recent Labs  Lab 11/17/20 0341 11/19/20 0319 11/20/20 0717 11/21/20 1606  AST 10* 17  --   --   ALT 8 10  --   --   ALKPHOS 106 118  --   --   BILITOT 0.6 0.6  --   --   PROT 6.8 7.0  --   --   ALBUMIN 2.3*  2.4* 2.6* 2.6* 2.7*    No results for input(s): LIPASE, AMYLASE in the last 168 hours. No results for input(s): AMMONIA in the last 168 hours. CBC: Recent Labs  Lab 11/17/20 0341 11/18/20 0953 11/19/20 0319 11/20/20 0717 11/21/20 1606 11/22/20 0320  WBC 16.3* 12.1* 11.2* 12.2* 12.8* 10.2  NEUTROABS 13.7*  --  8.7*  --   --   --   HGB 8.7* 8.6* 9.0* 9.4* 8.9* 9.2*  HCT 26.1* 26.3* 27.7* 28.7* 29.4* 29.0*  MCV 86.1 88.6 88.2 90.0 93.9 91.2  PLT 275 274 286 291 318 287    Cardiac Enzymes: No results for input(s): CKTOTAL, CKMB, CKMBINDEX, TROPONINI in the last 168 hours.  CBG: No results for input(s): GLUCAP in the last  168 hours.  Iron Studies: No results for input(s): IRON, TIBC, TRANSFERRIN, FERRITIN in the last 72 hours. Studies/Results: DG Bone Survey Met  Result Date: 11/21/2020 CLINICAL DATA:  Right chest wall mass, destructive right clavicular lesion on recent CT and MRI EXAM: METASTATIC BONE SURVEY COMPARISON:  11/13/2020, 11/18/2020, 11/20/2020 FINDINGS: Standard skeletal survey was performed. Lateral skull: Innumerable punctate lucencies are seen throughout the calvarium in a configuration consistent with salt and pepper appearance. No other destructive lesions. No acute bony abnormality. Upper extremities: Frontal views of the bilateral upper extremities from the shoulders through the wrists are obtained. There is  symmetrical resorption of the distal clavicles abutting the acromioclavicular joints, with normal appearance of the acromion process. No other acute or destructive bony abnormalities. Spine: Frontal and lateral views of the cervical, thoracic, and lumbar spine was performed. There are no acute or destructive bony abnormalities. Disc spaces are well preserved. Paraspinal soft tissues are unremarkable. High attenuation material overlying the central upper abdomen consistent with gastric contents. Chest: Frontal view of the chest demonstrates stable right internal jugular dialysis catheter. Cardiac silhouette is unremarkable. No airspace disease, effusion, or pneumothorax. No acute bony abnormalities. The lytic process involving the medial right clavicle seen on CT is less well appreciated by radiograph. Pelvis: Single frontal view of the pelvis demonstrates no acute bony abnormality. Symmetrical erosive changes are seen involving the bilateral sacroiliac joints. Joint spaces are otherwise well preserved. Lower extremities: Frontal views of the bilateral lower extremities from the hips through the ankles are obtained. There are small areas of subperiosteal bone resorption along the medial aspect of the proximal tibial metadiaphyseal junctions bilaterally. No other acute or destructive bony abnormalities. Joint spaces are well preserved. IMPRESSION: 1. 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 2. No acute fractures. Electronically Signed   By: Michael  Brown M.D.   On: 11/21/2020 17:27    Medications: Infusions:  sodium chloride      Scheduled Medications:  Chlorhexidine Gluconate Cloth  6 each Topical Q0600   [START ON 11/24/2020] darbepoetin (ARANESP) injection - DIALYSIS  60 mcg Intravenous Q Mon-HD   feeding supplement (NEPRO  CARB STEADY)  237 mL Oral BID BM   lanthanum  1,000 mg Oral TID WC   multivitamin  1 tablet Oral QHS   sodium chloride flush  3 mL Intravenous Q12H   vitamin B-12  1,000 mcg Oral Daily    have reviewed scheduled and prn medications.  Physical Exam: General:NAD, looks comfortable and lying on bed. Heart:RRR, s1s2 nl Lungs:clear b/l, no crackle Abdomen:soft, Non-tender, non-distended Extremities:No edema Dialysis Access: RIJ TDC   Dron Prasad Bhandari 11/23/2020,12:47 PM  LOS: 9 days   

## 2020-11-23 NOTE — Progress Notes (Signed)
PROGRESS NOTE    Micheal Bean  ZYY:[id number] DOB: [birth year] DOA: 11/13/2020 PCP: Patient, No Pcp Per (Inactive)    Brief Narrative: This 35 years old male with PMH significant for hx. of right nephrectomy as an infant presented with constellation of intermittent symptoms including headache, lightheadedness and chest tightness for 3 to 4 days.  He has tried some fluids at home which seemed to help a little,  he has also tried a dose of his mother's amlodipine but did not seem to help.  He does reports losing 20 pounds in last 2 months. He was seen in urgent care and was sent to the ED for further evaluation.  Patient was admitted for AKI on CKD,  found to have left-sided hydronephrosis with possible UTI, severe anemia and leukocytosis.  Patient started on hemodialysis.  Plan is to arrange outpatient hemodialysis set up so patient can be discharged.  Assessment & Plan:   Principal Problem:   Acute renal failure (ARF) (HCC) Active Problems:   Acute lower UTI   Leukocytosis   Bone lesion   AKI (acute kidney injury) ([place])   Osteomyelitis (HCC)   AKI on CKD stage III: Progression of CKD stage III now End-stage renal disease requiring hemodialysis. Most recent creatinine was 2.0 in 2014 unclear what his recent baseline may have been. 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 He is found to have severe hydronephrosis of the remaining left kidney. Urology consulted, Foley inserted.  Patient may need cystoscopy/retrograde study if creatinine not improving Nephrology consulted, no renal recovery ,  Patient underwent tunneled hemodialysis catheter on 11/18/20 followed by hemodialysis. SPEP, UPEP, free light chains - elevated kappa/lamda light chain - may need to discuss with heme, follow SPEP - monoclonal protein not apparent.  Hematology consulted.  Recommended 24-hour urine collection for plasma cell dyscrasias. Patient underwent hemodialysis on 8/16,  8/19, next dialysis 8/22 Patient is interested in outpatient peritoneal dialysis to maintain independence /mobility as an outpatient( He will be followed by Dr. Royce Macadamia) Plan is to arrange outpatient hemodialysis set up so patient can be discharged. He will be a very good transplant candidate given his minimal comorbidity and young age.   Acute urinary retention: > Resolved. Patient was continued on Foley catheter, urology was reconsulted.  Passed voiding trial Recommended outpatient follow-up for consideration of urodynamics to verify bladder function.  Anion gap metabolic acidosis: Resolved with sodium bicarbonate infusion and now with dialysis.  Soft tissue thickening around right supraclavicular joint: Leukocytosis > Improving: There was a concern for UTI, but urine culture no growth. CT chest showed  heterogeneous lucency involving head of right clavicle concerning for infection/osteomyelitis. MRI  joint showed  fairly symmetric marrow edema in both clavicular heads with mild surrounding soft tissue swelling (argues against infection, favors inflammatory or crystal arthropathy) Completed ceftriaxone for UTI.  Blood cultures no growth to date. ID recommended holding off on antibiotics at this point. CT Sx states there is no obvious area of fluid collection requiring surgical drainage, No need for surgical intervention at this point.  Elevated Inflammatory Markers ESR >140, CRP  Follow ANA, anti CCP, RF  Normocytic normochromic anemia: Could be multifactorial due to B12 deficiency,  anemia of chronic disease and iron deficiency. Found to have significant anemia 6.8, s/p 3 units PRBC Continue B12 and folate supplementation, iron supplementation. No history of black stools. Hb stable at 9.0 > 9.4> 9.2   Elevated D Dimer Suspect related  to inflammation. Negative LE [place] for DVT Consider VQ scan, but no evidence of hypoxia, denies CP - suspect dimer related to other systemic issues,  but low threshold for further w/u.    Family requested transfer to [place] earlier in hospitalization.  [facility] declined given family request.  Consider if medical need going further.  He can also follow outpatient as desired.   DVT prophylaxis: Heparin Code Status: Full code. Family Communication: Wife at bed side. Disposition Plan:    Status is: Inpatient  Remains inpatient appropriate because:Inpatient level of care appropriate due to severity of illness  Dispo: The patient is from: Home              Anticipated d/c is to: Home              Patient currently is not medically stable to d/c.   Difficult to place patient No  Consultants:  Nephrology ID [email or web address].  Procedures:  HD, TDC Antimicrobials:   Anti-infectives (From admission, onward)    Start     Dose/Rate Route Frequency Ordered Stop   11/18/20 1522  ceFAZolin (ANCEF) IVPB 2g/100 mL premix        over 30 Minutes Intravenous Continuous PRN 11/18/20 1532 11/18/20 1522   11/18/20 1512  ceFAZolin (ANCEF) 2-4 GM/100ML-% IVPB       Note to Pharmacy: [name]   : cabinet override      11/18/20 1512 11/19/20 0329   11/18/20 0000  ceFAZolin (ANCEF) IVPB 2g/100 mL premix  Status:  Discontinued        2 g 200 mL/hr over 30 Minutes Intravenous To Radiology 11/17/20 1014 11/17/20 1014   11/17/20 0000  ceFAZolin (ANCEF) IVPB 2g/100 mL premix  Status:  Discontinued        2 g 200 mL/hr over 30 Minutes Intravenous To Radiology 11/16/20 1400 11/17/20 0906   11/14/20 2100  DAPTOmycin (CUBICIN) 700 mg in sodium chloride 0.9 % IVPB  Status:  Discontinued        8 mg/kg  85.2 kg (Adjusted) 128 mL/hr over 30 Minutes Intravenous Every 48 hours 11/14/20 2006 11/17/20 1127   11/14/20 2045  cefTRIAXone (ROCEPHIN) 2 g in sodium chloride 0.9 % 100 mL IVPB  Status:  Discontinued        2 g 200 mL/hr over 30 Minutes Intravenous Every 24 hours 11/14/20 1948 11/17/20 1127   11/13/20 2230  cefTRIAXone (ROCEPHIN) 1 g in sodium chloride 0.9 % 100  mL IVPB  Status:  Discontinued        1 g 200 mL/hr over 30 Minutes Intravenous Every 24 hours 11/13/20 2223 11/14/20 1948        Subjective: Patient was seen and examined at bedside.  No overnight events. Patient seems much improved.  He reports that he was able to urinate without catheter. Outpatient hemodialysis set up is in process.  Objective: Vitals:   11/22/20 0922 11/22/20 1700 11/22/20 2057 11/23/20 0920  BP: 134/69 126/82 131/82 (!) 156/87  Pulse: 82 80 82 96  Resp: _0 Temp: 98.3 F (36.8 C) 98.2 F (36.8 C) 98 F (36.7 C) 98.6 F (37 C)  TempSrc: Oral Oral Oral Oral  SpO2: 95% 99% 100% 99%  Weight:      Height:        Intake/Output Summary (Last 24 hours) at 11/23/2020 1023 Last data filed at 11/23/2020 0122 Gross per 24 hour  Intake 710 ml  Output 750 ml  Net -40 ml  Filed Weights   11/20/20 2120 11/21/20 1545 11/21/20 1857  Weight: 78.6 kg 78.6 kg 78.5 kg    Examination:  General exam: Appears comfortable, not in any acute distress.  Right IJ catheter noted. Respiratory system: Clear to auscultation bilaterally, no wheezing, no crackles. Cardiovascular system: S1-S2 heard, regular rate and rhythm, no murmur Gastrointestinal system: Abdomen is soft, nontender, nondistended, BS positive Central nervous system: Alert and oriented x 3, no focal neurological deficits. Extremities: No edema, no cyanosis, no clubbing. Skin: No rash. Psychiatry: Mood and affect appropriate.    Data Reviewed: I have personally reviewed following labs and imaging studies  CBC: Recent Labs  Lab 11/17/20 0341 11/18/20 0953 11/19/20 0319 11/20/20 0717 11/21/20 1606 11/22/20 0320  WBC 16.3* 12.1* 11.2* 12.2* 12.8* 10.2  NEUTROABS 13.7*  --  8.7*  --   --   --   HGB 8.7* 8.6* 9.0* 9.4* 8.9* 9.2*  HCT 26.1* 26.3* 27.7* 28.7* 29.4* 29.0*  MCV 86.1 88.6 88.2 90.0 93.9 91.2  PLT 275 274 286 291 318 562    Basic Metabolic Panel: Recent Labs  Lab  11/18/20 0953 11/19/20 0319 11/20/20 0717 11/21/20 1606 11/22/20 0320  NA 138 138 136 137 134*  K 3.7 3.5 3.6 4.0 3.8  CL 102 102 101 100 100  CO2 17* 23 21* 24 26  GLUCOSE 83 83 91 86 85  BUN 115* 70* 73* 49* 22*  CREATININE 11.87* 8.34* 9.82* 7.98* 5.10*  CALCIUM 9.2 9.3 9.2 9.0 9.1  MG 1.9 1.9  --   --  2.0  PHOS 9.5* 7.4* 8.6* 7.2* 6.4*    GFR: Estimated Creatinine Clearance: 23.1 mL/min (A) (by C-G formula based on SCr of 5.1 mg/dL (H)). Liver Function Tests: Recent Labs  Lab 11/17/20 0341 11/19/20 0319 11/20/20 0717 11/21/20 1606  AST 10* 17  --   --   ALT 8 10  --   --   ALKPHOS 106 118  --   --   BILITOT 0.6 0.6  --   --   PROT 6.8 7.0  --   --   ALBUMIN 2.3*  2.4* 2.6* 2.6* 2.7*    No results for input(s): LIPASE, AMYLASE in the last 168 hours. No results for input(s): AMMONIA in the last 168 hours. Coagulation Profile: No results for input(s): INR, PROTIME in the last 168 hours.  Cardiac Enzymes: No results for input(s): CKTOTAL, CKMB, CKMBINDEX, TROPONINI in the last 168 hours.  BNP (last 3 results) No results for input(s): PROBNP in the last 8760 hours. HbA1C: No results for input(s): HGBA1C in the last 72 hours. CBG: No results for input(s): GLUCAP in the last 168 hours. Lipid Profile: No results for input(s): CHOL, HDL, LDLCALC, TRIG, CHOLHDL, LDLDIRECT in the last 72 hours. Thyroid Function Tests: No results for input(s): TSH, T4TOTAL, FREET4, T3FREE, THYROIDAB in the last 72 hours. Anemia Panel: Recent Labs    11/21/20 1606  RETICCTPCT 2.5    Sepsis Labs: No results for input(s): PROCALCITON, LATICACIDVEN in the last 168 hours.  Recent Results (from the past 240 hour(s))  Resp Panel by RT-PCR (Flu A&B, Covid) Nasopharyngeal Swab     Status: None   Collection Time: 11/13/20  7:30 PM   Specimen: Nasopharyngeal Swab; Nasopharyngeal(NP) swabs in vial transport medium  Result Value Ref Range Status   SARS Coronavirus 2 by RT PCR NEGATIVE  NEGATIVE Final    Comment: (NOTE) SARS-CoV-2 target nucleic acids are NOT DETECTED.  The SARS-CoV-2 RNA is generally  detectable in upper respiratory specimens during the acute phase of infection. The lowest concentration of SARS-CoV-2 viral copies this assay can detect is 138 copies/mL. A negative result does not preclude SARS-Cov-2 infection and should not be used as the sole basis for treatment or other patient management decisions. A negative result may occur with  improper specimen collection/handling, submission of specimen other than nasopharyngeal swab, presence of viral mutation(s) within the areas targeted by this assay, and inadequate number of viral copies(<138 copies/mL). A negative result must be combined with clinical observations, patient history, and epidemiological information. The expected result is Negative.  Fact Sheet for Patients:  EntrepreneurPulse.com.au  Fact Sheet for Healthcare Providers:  IncredibleEmployment.be  This test is no t yet approved or cleared by the [place] FDA and  has been authorized for detection and/or diagnosis of SARS-CoV-2 by FDA under an Emergency Use Authorization (EUA). This EUA will remain  in effect (meaning this test can be used) for the duration of the COVID-19 declaration under Section 564(b)(1) of the Act, 21 U.S.C.section 360bbb-3(b)(1), unless the authorization is terminated  or revoked sooner.       Influenza A by PCR NEGATIVE NEGATIVE Final   Influenza B by PCR NEGATIVE NEGATIVE Final    Comment: (NOTE) The Xpert Xpress SARS-CoV-2/FLU/RSV plus assay is intended as an aid in the diagnosis of influenza from Nasopharyngeal swab specimens and should not be used as a sole basis for treatment. Nasal washings and aspirates are unacceptable for Xpert Xpress SARS-CoV-2/FLU/RSV testing.  Fact Sheet for Patients: EntrepreneurPulse.com.au  Fact Sheet for Healthcare  Providers: IncredibleEmployment.be  This test is not yet approved or cleared by the [place] FDA and has been authorized for detection and/or diagnosis of SARS-CoV-2 by FDA under an Emergency Use Authorization (EUA). This EUA will remain in effect (meaning this test can be used) for the duration of the COVID-19 declaration under Section 564(b)(1) of the Act, 21 U.S.C. section 360bbb-3(b)(1), unless the authorization is terminated or revoked.  Performed at [facility], [place] [address], [place], [place] [address]   Urine Culture     Status: None   Collection Time: 11/13/20 10:23 PM   Specimen: Urine, Clean Catch  Result Value Ref Range Status   Specimen Description URINE, CLEAN CATCH  Final   Special Requests NONE  Final   Culture   Final    NO GROWTH Performed at [facility], [place] [address], [place], [place] [address]    Report Status 11/15/2020 FINAL  Final  Culture, blood (routine x 2)     Status: None   Collection Time: 11/14/20  9:19 AM   Specimen: BLOOD RIGHT FOREARM  Result Value Ref Range Status   Specimen Description BLOOD RIGHT FOREARM  Final   Special Requests   Final    BOTTLES DRAWN AEROBIC AND ANAEROBIC Blood Culture results may not be optimal due to an excessive volume of blood received in culture bottles   Culture   Final    NO GROWTH 5 DAYS Performed at [facility], [place] [address], [place], [place] [address]    Report Status 11/19/2020 FINAL  Final  Culture, blood (Routine X 2) w Reflex to ID Panel     Status: None   Collection Time: 11/14/20 11:20 PM   Specimen: BLOOD LEFT FOREARM  Result Value Ref Range Status   Specimen Description BLOOD LEFT FOREARM  Final   Special Requests   Final    BOTTLES DRAWN AEROBIC  ONLY Blood Culture adequate volume   Culture   Final    NO GROWTH 5 DAYS Performed at [facility], [place] [address], [place], [place] [address]    Report Status 11/20/2020 FINAL  Final      Radiology Studies: DG Bone Survey Met  Result Date: 11/21/2020 CLINICAL DATA:  Right chest wall mass, destructive right clavicular lesion on recent CT and MRI EXAM: METASTATIC BONE SURVEY COMPARISON:  11/13/2020, 11/18/2020, 11/20/2020 FINDINGS: Standard skeletal survey was performed. Lateral skull: Innumerable punctate lucencies are seen throughout the calvarium in a configuration consistent with salt and pepper appearance. No other destructive lesions. No acute bony abnormality. Upper extremities: Frontal views of the bilateral upper extremities from the shoulders through the wrists are obtained. There is symmetrical resorption of the distal clavicles abutting the acromioclavicular joints, with normal appearance of the acromion process. No other acute or destructive bony abnormalities. Spine: Frontal and lateral views of the cervical, thoracic, and lumbar spine was performed. There are no acute or destructive bony abnormalities. Disc spaces are well preserved. Paraspinal soft tissues are unremarkable. High attenuation material overlying the central upper abdomen consistent with gastric contents. Chest: Frontal view of the chest demonstrates stable right internal jugular dialysis catheter. Cardiac silhouette is unremarkable. No airspace disease, effusion, or pneumothorax. No acute bony abnormalities. The lytic process involving the medial right clavicle seen on CT is less well appreciated by radiograph. Pelvis: Single frontal view of the pelvis demonstrates no acute bony abnormality. Symmetrical erosive changes are seen involving the bilateral sacroiliac joints. Joint spaces are otherwise well preserved. Lower extremities: Frontal views of the bilateral lower extremities from the hips through the ankles are obtained. There are small areas of subperiosteal bone resorption along the medial aspect of the proximal tibial metadiaphyseal junctions bilaterally. No other acute or destructive bony abnormalities. Joint  spaces are well preserved. IMPRESSION: 1. 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 2. No acute fractures. Electronically Signed   By: Randa Ngo M.D.   On: 11/21/2020 17:27     Scheduled Meds:  Chlorhexidine Gluconate Cloth  6 each Topical Q0600   [START ON 11/24/2020] darbepoetin (ARANESP) injection - DIALYSIS  60 mcg Intravenous Q Mon-HD   feeding supplement (NEPRO CARB STEADY)  237 mL Oral BID BM   lanthanum  1,000 mg Oral TID WC   multivitamin  1 tablet Oral QHS   sodium chloride flush  3 mL Intravenous Q12H   vitamin B-12  1,000 mcg Oral Daily   Continuous Infusions:  sodium chloride       LOS: 9 days    Time spent: 25 mins.    Shawna Clamp, MD [facility]   If 7PM-7AM, please contact night-coverage

## 2020-11-24 DIAGNOSIS — N189 Chronic kidney disease, unspecified: Secondary | ICD-10-CM | POA: Insufficient documentation

## 2020-11-24 DIAGNOSIS — D509 Iron deficiency anemia, unspecified: Secondary | ICD-10-CM | POA: Insufficient documentation

## 2020-11-24 DIAGNOSIS — Z992 Dependence on renal dialysis: Secondary | ICD-10-CM | POA: Insufficient documentation

## 2020-11-24 DIAGNOSIS — E876 Hypokalemia: Secondary | ICD-10-CM | POA: Insufficient documentation

## 2020-11-24 DIAGNOSIS — N186 End stage renal disease: Secondary | ICD-10-CM | POA: Insufficient documentation

## 2020-11-24 DIAGNOSIS — E440 Moderate protein-calorie malnutrition: Secondary | ICD-10-CM | POA: Insufficient documentation

## 2020-11-24 DIAGNOSIS — T782XXA Anaphylactic shock, unspecified, initial encounter: Secondary | ICD-10-CM | POA: Insufficient documentation

## 2020-11-24 DIAGNOSIS — T829XXA Unspecified complication of cardiac and vascular prosthetic device, implant and graft, initial encounter: Secondary | ICD-10-CM | POA: Insufficient documentation

## 2020-11-24 LAB — RENAL FUNCTION PANEL
Albumin: 2.6 g/dL — ABNORMAL LOW (ref 3.5–5.0)
Anion gap: 10 (ref 5–15)
BUN: 38 mg/dL — ABNORMAL HIGH (ref 6–20)
CO2: 26 mmol/L (ref 22–32)
Calcium: 8.9 mg/dL (ref 8.9–10.3)
Chloride: 101 mmol/L (ref 98–111)
Creatinine, Ser: 7.82 mg/dL — ABNORMAL HIGH (ref 0.61–1.24)
GFR, Estimated: 9 mL/min — ABNORMAL LOW (ref >60)
Glucose, Bld: 145 mg/dL — ABNORMAL HIGH (ref 70–99)
Phosphorus: 5.5 mg/dL — ABNORMAL HIGH (ref 2.5–4.6)
Potassium: 3.7 mmol/L (ref 3.5–5.1)
Sodium: 137 mmol/L (ref 135–145)

## 2020-11-24 LAB — CBC
HCT: 28.5 % — ABNORMAL LOW (ref 39.0–52.0)
Hemoglobin: 8.7 g/dL — ABNORMAL LOW (ref 13.0–17.0)
MCH: 28.6 pg (ref 26.0–34.0)
MCHC: 30.5 g/dL (ref 30.0–36.0)
MCV: 93.8 fL (ref 80.0–100.0)
Platelets: 276 10*3/uL (ref 150–400)
RBC: 3.04 MIL/uL — ABNORMAL LOW (ref 4.22–5.81)
RDW: 13.4 % (ref 11.5–15.5)
WBC: 11.6 10*3/uL — ABNORMAL HIGH (ref 4.0–10.5)
nRBC: 0 % (ref 0.0–0.2)

## 2020-11-24 LAB — ERYTHROPOIETIN: Erythropoietin: 21.9 m[IU]/mL — ABNORMAL HIGH (ref 2.6–18.5)

## 2020-11-24 MED ORDER — HEPARIN SODIUM (PORCINE) 1000 UNIT/ML DIALYSIS: 1000.0000 [IU] | INTRAMUSCULAR | Status: DC | PRN

## 2020-11-24 MED ORDER — LANTHANUM CARBONATE 1000 MG PO CHEW: 1000.0000 mg | CHEWABLE_TABLET | Freq: Three times a day (TID) | ORAL | 0 refills | Status: DC

## 2020-11-24 MED ORDER — RENA-VITE PO TABS: 1.0000 | ORAL_TABLET | Freq: Every day | ORAL | 1 refills | Status: DC

## 2020-11-24 MED ORDER — SODIUM CHLORIDE 0.9 % IV SOLN: 100.0000 mL | INTRAVENOUS | Status: DC | PRN

## 2020-11-24 MED ORDER — PENTAFLUOROPROP-TETRAFLUOROETH EX AERO: 1.0000 "application " | INHALATION_SPRAY | CUTANEOUS | Status: DC | PRN

## 2020-11-24 MED ORDER — ALTEPLASE 2 MG IJ SOLR: 2.0000 mg | Freq: Once | INTRAMUSCULAR | Status: DC | PRN

## 2020-11-24 MED ORDER — HEPARIN SODIUM (PORCINE) 1000 UNIT/ML DIALYSIS: 20.0000 [IU]/kg | INTRAMUSCULAR | Status: DC | PRN

## 2020-11-24 MED ORDER — LIDOCAINE HCL (PF) 1 % IJ SOLN: 5.0000 mL | INTRAMUSCULAR | Status: DC | PRN

## 2020-11-24 MED ORDER — DARBEPOETIN ALFA 60 MCG/0.3ML IJ SOSY
PREFILLED_SYRINGE | INTRAMUSCULAR | Status: AC
  Administered 2020-11-24: 60 ug via INTRAVENOUS
  Filled 2020-11-24: qty 0.3

## 2020-11-24 MED ORDER — CYANOCOBALAMIN 1000 MCG PO TABS: 1000.0000 ug | ORAL_TABLET | Freq: Every day | ORAL | 1 refills | Status: DC

## 2020-11-24 MED ORDER — LIDOCAINE-PRILOCAINE 2.5-2.5 % EX CREA: 1.0000 "application " | TOPICAL_CREAM | CUTANEOUS | Status: DC | PRN

## 2020-11-24 NOTE — Progress Notes (Signed)
Out Patient Arrangements:  Pt will need to go to the clinic the day before to sign paperwork.  Pt has been accepted @ [facility] on their TTS shift w/ a time of 5:20 am.   The address is  [place], [place] [address]  Phone: [phone]   Please advise of d/c date.  Linus Orn HPSS [phone]

## 2020-11-24 NOTE — Progress Notes (Signed)
PROGRESS NOTE    [name]  MTN:[id number] DOB: 1985/08/25 DOA: 11/13/2020 PCP: Patient, No Pcp Per (Inactive)    Brief Narrative: This 35 years old male with PMH significant for hx. of right nephrectomy as an infant presented with constellation of intermittent symptoms including headache, lightheadedness and chest tightness for 3 to 4 days.  He has tried some fluids at home which seemed to help a little,  he has also tried a dose of his mother's amlodipine but did not seem to help.  He does reports losing 20 pounds in last 2 months. He was seen in urgent care and was sent to the ED for further evaluation.  Patient was admitted for AKI on CKD,  found to have left-sided hydronephrosis with possible UTI, severe anemia and leukocytosis.  Patient started on hemodialysis.  Plan is to arrange outpatient hemodialysis set up so patient can be discharged.  Assessment & Plan:   Principal Problem:   Acute renal failure (ARF) (HCC) Active Problems:   Acute lower UTI   Leukocytosis   Bone lesion   AKI (acute kidney injury) (HCC)   Osteomyelitis (HCC)   AKI on CKD stage III: Progression of CKD stage III now End-stage renal disease requiring hemodialysis. Most recent creatinine was 2.0 in 2014 unclear what his recent baseline may have been. 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 He is found to have severe hydronephrosis of the remaining left kidney. Urology consulted, Foley inserted.  Patient may need cystoscopy/retrograde study if creatinine not improving Nephrology consulted, no renal recovery ,  Patient underwent tunneled hemodialysis catheter on 11/18/20 followed by hemodialysis. SPEP, UPEP, free light chains - elevated kappa/lamda light chain - may need to discuss with heme, follow SPEP - monoclonal protein not apparent.  Hematology consulted.  Recommended 24-hour urine collection for plasma cell dyscrasias. Patient underwent hemodialysis on 8/16,  8/19, 8/22.  Next dialysis Monday Wednesday and Friday schedule. Patient is interested in outpatient peritoneal dialysis to maintain independence /mobility as an outpatient( He will be followed by Dr. Malen Gauze) Plan is to arrange outpatient hemodialysis set up so patient can be discharged. He will be a very good transplant candidate given his minimal comorbidity and young age.  Acute urinary retention: > Resolved. Patient was continued on Foley catheter, urology was reconsulted.  Passed voiding trial Recommended outpatient follow-up for consideration of urodynamics to verify bladder function.  Anion gap metabolic acidosis: Resolved with sodium bicarbonate infusion and now with dialysis.  Soft tissue thickening around right supraclavicular joint: Leukocytosis > Improving: There was a concern for UTI, but urine culture no growth. CT chest showed  heterogeneous lucency involving head of right clavicle concerning for infection/osteomyelitis. MRI [place] joint showed  fairly symmetric marrow edema in both clavicular heads with mild surrounding soft tissue swelling (argues against infection, favors inflammatory or crystal arthropathy) Completed ceftriaxone for UTI.  Blood cultures no growth to date. ID recommended holding off on antibiotics at this point. CT Sx states there is no obvious area of fluid collection requiring surgical drainage, No need for surgical intervention at this point.  Elevated Inflammatory Markers ESR >140, CRP 12.4 Follow ANA, anti CCP, RF  Normocytic normochromic anemia: Could be multifactorial due to B12 deficiency,  anemia of chronic disease and iron deficiency. Found to have significant anemia 6.8, s/p 3 units PRBC Continue B12 and folate supplementation, iron supplementation. No history of black stools. Hb stable at 9.0 > 9.4> 9.2 >8.8  Elevated D Dimer Suspect related to inflammation. Negative LE [place] for DVT Consider VQ scan, but no evidence of hypoxia, denies CP -  suspect dimer related to other systemic issues, but low threshold for further w/u.    Family requested transfer to [place] earlier in hospitalization.  [facility] declined given family request.   DVT prophylaxis: Heparin Code Status: Full code. Family Communication: Wife at bed side. Disposition Plan:    Status is: Inpatient  Remains inpatient appropriate because:Inpatient level of care appropriate due to severity of illness  Dispo: The patient is from: Home              Anticipated d/c is to: Home              Patient currently is not medically stable to d/c.   Difficult to place patient No  Consultants:  Nephrology ID [email or web address].  Procedures:  HD, TDC Antimicrobials:   Anti-infectives (From admission, onward)    Start     Dose/Rate Route Frequency Ordered Stop   11/18/20 1522  ceFAZolin (ANCEF) IVPB 2g/100 mL premix        over 30 Minutes Intravenous Continuous PRN 11/18/20 1532 11/18/20 1522   11/18/20 1512  ceFAZolin (ANCEF) 2-4 GM/100ML-% IVPB       Note to Pharmacy: [name]   : cabinet override      11/18/20 1512 11/19/20 0329   11/18/20 0000  ceFAZolin (ANCEF) IVPB 2g/100 mL premix  Status:  Discontinued        2 g 200 mL/hr over 30 Minutes Intravenous To Radiology 11/17/20 1014 11/17/20 1014   11/17/20 0000  ceFAZolin (ANCEF) IVPB 2g/100 mL premix  Status:  Discontinued        2 g 200 mL/hr over 30 Minutes Intravenous To Radiology 11/16/20 1400 11/17/20 0906   11/14/20 2100  DAPTOmycin (CUBICIN) 700 mg in sodium chloride 0.9 % IVPB  Status:  Discontinued        8 mg/kg  85.2 kg (Adjusted) 128 mL/hr over 30 Minutes Intravenous Every 48 hours 11/14/20 2006 11/17/20 1127   11/14/20 2045  cefTRIAXone (ROCEPHIN) 2 g in sodium chloride 0.9 % 100 mL IVPB  Status:  Discontinued        2 g 200 mL/hr over 30 Minutes Intravenous Every 24 hours 11/14/20 1948 11/17/20 1127   11/13/20 2230  cefTRIAXone (ROCEPHIN) 1 g in sodium chloride 0.9 % 100 mL IVPB  Status:  Discontinued         1 g 200 mL/hr over 30 Minutes Intravenous Every 24 hours 11/13/20 2223 11/14/20 1948        Subjective: Patient was seen and examined at hemodialysis.  No overnight events.. Patient reports he is improved.  He is frustrated with the prolonged process of dialysis set up outside. He is able to urinate.  Denies any further urinary symptoms.  Objective: Vitals:   11/24/20 0900 11/24/20 0930 11/24/20 1000 11/24/20 1030  BP: 130/84 (!) 110/92 (!) 117/98 102/80  Pulse: 92 86 91 (!) 105  Resp:      Temp:      TempSrc:      SpO2:      Weight:      Height:        Intake/Output Summary (Last 24 hours) at 11/24/2020 1055 Last data filed at 11/23/2020 2200 Gross per 24 hour  Intake 660 ml  Output 0 ml  Net 660 ml   Filed Weights   11/21/20 1857 11/24/20 8675 11/24/20 0749  Weight: 78.5 kg 75.6 kg 79.2 kg    Examination:  General exam: Appears comfortable, not in any distress.  Right IJ catheter noted,  no signs of erythema. Respiratory system: Clear to auscultation bilaterally, no wheezing, no crackles. Cardiovascular system: S1-S2 heard, regular rate and rhythm, no murmur Gastrointestinal system: Abdomen is soft, nontender, nondistended, BS positive Central nervous system: Alert and oriented x3, no focal neurological deficits. Extremities: No edema, no cyanosis, no clubbing. Skin: No rash. Psychiatry: Mood and affect appropriate.    Data Reviewed: I have personally reviewed following labs and imaging studies  CBC: Recent Labs  Lab 11/19/20 0319 11/20/20 0717 11/21/20 1606 11/22/20 0320 11/24/20 0745  WBC 11.2* 12.2* 12.8* 10.2 11.6*  NEUTROABS 8.7*  --   --   --   --   HGB 9.0* 9.4* 8.9* 9.2* 8.7*  HCT 27.7* 28.7* 29.4* 29.0* 28.5*  MCV 88.2 90.0 93.9 91.2 93.8  PLT 286 291 318 287 938   Basic Metabolic Panel: Recent Labs  Lab 11/18/20 0953 11/19/20 0319 11/20/20 0717 11/21/20 1606 11/22/20 0320 11/24/20 0745  NA 138 138 136 137 134* 137  K 3.7 3.5 3.6  4.0 3.8 3.7  CL 102 102 101 100 100 101  CO2 17* 23 21* $Remo'24 26 26  'LCHKh$ GLUCOSE 83 83 91 86 85 145*  BUN 115* 70* 73* 49* 22* 38*  CREATININE 11.87* 8.34* 9.82* 7.98* 5.10* 7.82*  CALCIUM 9.2 9.3 9.2 9.0 9.1 8.9  MG 1.9 1.9  --   --  2.0  --   PHOS 9.5* 7.4* 8.6* 7.2* 6.4* 5.5*   GFR: Estimated Creatinine Clearance: 15.2 mL/min (A) (by C-G formula based on SCr of 7.82 mg/dL (H)). Liver Function Tests: Recent Labs  Lab 11/19/20 0319 11/20/20 0717 11/21/20 1606 11/24/20 0745  AST 17  --   --   --   ALT 10  --   --   --   ALKPHOS 118  --   --   --   BILITOT 0.6  --   --   --   PROT 7.0  --   --   --   ALBUMIN 2.6* 2.6* 2.7* 2.6*   No results for input(s): LIPASE, AMYLASE in the last 168 hours. No results for input(s): AMMONIA in the last 168 hours. Coagulation Profile: No results for input(s): INR, PROTIME in the last 168 hours.  Cardiac Enzymes: No results for input(s): CKTOTAL, CKMB, CKMBINDEX, TROPONINI in the last 168 hours.  BNP (last 3 results) No results for input(s): PROBNP in the last 8760 hours. HbA1C: No results for input(s): HGBA1C in the last 72 hours. CBG: No results for input(s): GLUCAP in the last 168 hours. Lipid Profile: No results for input(s): CHOL, HDL, LDLCALC, TRIG, CHOLHDL, LDLDIRECT in the last 72 hours. Thyroid Function Tests: No results for input(s): TSH, T4TOTAL, FREET4, T3FREE, THYROIDAB in the last 72 hours. Anemia Panel: Recent Labs    11/21/20 1606  RETICCTPCT 2.5   Sepsis Labs: No results for input(s): PROCALCITON, LATICACIDVEN in the last 168 hours.  Recent Results (from the past 240 hour(s))  Culture, blood (Routine X 2) w Reflex to ID Panel     Status: None   Collection Time: 11/14/20 11:20 PM   Specimen: BLOOD LEFT FOREARM  Result Value Ref Range Status   Specimen Description BLOOD LEFT FOREARM  Final   Special Requests   Final    BOTTLES DRAWN AEROBIC ONLY Blood Culture adequate volume   Culture   Final  NO GROWTH 5  DAYS Performed at [facility], [place] [address], [place], [place] [address]    Report Status 11/20/2020 FINAL  Final     Radiology Studies: No results found.   Scheduled Meds:  Chlorhexidine Gluconate Cloth  6 each Topical Q0600   Chlorhexidine Gluconate Cloth  6 each Topical Q0600   darbepoetin (ARANESP) injection - DIALYSIS  60 mcg Intravenous Q Mon-HD   feeding supplement (NEPRO CARB STEADY)  237 mL Oral BID BM   lanthanum  1,000 mg Oral TID WC   multivitamin  1 tablet Oral QHS   sodium chloride flush  3 mL Intravenous Q12H   vitamin B-12  1,000 mcg Oral Daily   Continuous Infusions:  sodium chloride     sodium chloride     sodium chloride       LOS: 10 days    Time spent: 25 mins.    Shawna Clamp, MD [facility]   If 7PM-7AM, please contact night-coverage

## 2020-11-24 NOTE — Discharge Summary (Addendum)
Physician Discharge Summary  Micheal Bean:[id number] DOB: [birth year] DOA: 11/13/2020  PCP: Patient, No Pcp Per (Inactive)  Admit date: 11/13/2020  Discharge date: 11/24/2020  Admitted From: Home.  Disposition:  Home  Recommendations for Outpatient Follow-up:  Follow up with PCP in 1-2 weeks. Please obtain BMP/CBC in one week. Outpatient hemodialysis set up has been arranged. Patient will follow up with Dr. Malen Gauze.  Patient will continue dialysis on TTS schedule.  Home Health:None Equipment/Devices:None  Discharge Condition: Good CODE STATUS:Full code Diet recommendation: Renal diet  Brief [facility] Pc course: This 35 years old male with PMH significant for hx. of right nephrectomy as an infant presented with constellation of intermittent symptoms including headache, lightheadedness and chest tightness for 3 to 4 days.  He has tried some fluids at home which seemed to help a little,  he has also tried a dose of his mother's amlodipine but did not seem to help.  He does reports losing 20 pounds in last 2 months. He was seen in urgent care and was sent to the ED for further evaluation.   Patient was admitted for AKI on CKD III b,  found to have left-sided hydronephrosis with possible UTI, Severe anemia and leukocytosis.  Renal functions did not improve with IV hydration and Foley insertion.  Nephrologist recommended hemodialysis. Patient started on hemodialysis after having tunneled dialysis catheter. Patient has received 3 sessions of dialysis so far and has tolerated well.  Voiding trial completed,  Patient is able to urinate self. Patient has received 2 units of PRBCs during hospitalization,  hemoglobin remained stable afterwards.  He does have soft tissue thickening around his right supraclavicular joint.  CT and MRI showed soft tissue edema.  Initially patient was started on antibiotic with impression of osteomyelitis, then ID has recommended discontinue antibiotics. No evidence  of osteomyelitis. Cardiothoracic surgery was consulted to check with her fluid can be aspirated , there was no fluid.  Patient feels better and wants to be discharged.  Outpatient hemodialysis set up is being arranged on TTS schedule.  Patient will follow up with Dr. Malen Gauze.  Patient is being discharged home.  He was managed for below problems.  Discharge Diagnoses:  Principal Problem:   Acute renal failure (ARF) (HCC) Active Problems:   Acute lower UTI   Leukocytosis   Bone lesion   AKI (acute kidney injury) (HCC)   Osteomyelitis (HCC)  AKI on CKD stage III: Progression of CKD stage III now End-stage renal disease requiring hemodialysis. Most recent creatinine was 2.0 in 2014 unclear what his recent baseline may have been. 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 He is found to have severe hydronephrosis of the remaining left kidney. Urology consulted, Foley inserted.  Patient may need cystoscopy/retrograde study if creatinine not improving Nephrology consulted, no renal recovery ,  Patient underwent tunneled hemodialysis catheter on 11/18/20 followed by hemodialysis. SPEP, UPEP, free light chains - elevated kappa/lamda light chain - may need to discuss with heme, follow SPEP - monoclonal protein not apparent.  Hematology consulted.  Recommended 24-hour urine collection for plasma cell dyscrasias. No myeloma. Patient underwent hemodialysis on 8/16, 8/19, 8/22.  Next dialysis Monday Wednesday and Friday schedule. Patient is interested in outpatient peritoneal dialysis to maintain independence /mobility as an outpatient( He will be followed by Dr. Malen Gauze) He will be a very good transplant candidate given his minimal comorbidity and young age. Outpatient hemodialysis set up has been arranged.  Acute urinary retention: > Resolved. Patient was continued on Foley catheter, urology was reconsulted.  Passed voiding trial Recommended outpatient  follow-up for consideration of urodynamics to verify bladder function.   Anion gap metabolic acidosis: Resolved with sodium bicarbonate infusion and now with dialysis.  Soft tissue thickening around right supraclavicular joint: Leukocytosis > Improving: There was a concern for UTI, but urine culture no growth. CT chest showed  heterogeneous lucency involving head of right clavicle concerning for infection/osteomyelitis. MRI [place] joint showed  fairly symmetric marrow edema in both clavicular heads with mild surrounding soft tissue swelling (argues against infection, favors inflammatory or crystal arthropathy) Completed ceftriaxone for UTI.  Blood cultures no growth to date. ID recommended holding off on antibiotics at this point. CT Sx states there is no obvious area of fluid collection requiring surgical drainage, No need for surgical intervention at this point.   Elevated Inflammatory Markers ESR >140, CRP 12.4 Follow ANA, anti CCP, RF   Normocytic normochromic anemia: Could be multifactorial due to B12 deficiency,  anemia of chronic disease and iron deficiency. Found to have significant anemia 6.8, s/p 3 units PRBC Continue B12 and folate supplementation, iron supplementation. No history of black stools. Hb stable at 9.0 > 9.4> 9.2 >8.8   Elevated D Dimer Suspect related to inflammation. Negative LE [place] for DVT Consider VQ scan, but no evidence of hypoxia, denies CP - suspect dimer related to other systemic issues, but low threshold for further w/u.   Discharge Instructions  Discharge Instructions     Call MD for:  difficulty breathing, headache or visual disturbances   Complete by: As directed    Call MD for:  persistant dizziness or light-headedness   Complete by: As directed    Call MD for:  persistant nausea and vomiting   Complete by: As directed    Diet - low sodium heart healthy   Complete by: As directed    Diet Carb Modified   Complete by: As directed    Discharge  instructions   Complete by: As directed    Advised to follow-up with primary care physician in 1 week. Outpatient hemodialysis set up has been arranged.  Patient will follow up with Dr. Royce Macadamia.  Patient will continue dialysis on TTS schedule.   Discharge wound care:   Complete by: As directed    Follow-up nephrologist.   Increase activity slowly   Complete by: As directed       Allergies as of 11/24/2020   No Known Allergies      Medication List     STOP taking these medications    naproxen sodium 220 MG tablet Commonly known as: ALEVE       TAKE these medications    cyanocobalamin 1000 MCG tablet Take 1 tablet (1,000 mcg total) by mouth daily. Start taking on: November 25, 2020   lanthanum 1000 MG chewable tablet Commonly known as: FOSRENOL Chew 1 tablet (1,000 mg total) by mouth 3 (three) times daily with meals.   multivitamin Tabs tablet Take 1 tablet by mouth at bedtime.               Discharge Care Instructions  (From admission, onward)           Start     Ordered   11/24/20 0000  Discharge wound care:       Comments: Follow-up nephrologist.   11/24/20 1247            Follow-up Information  [facility]. Go on 12/16/2020.   Why: You appointment is at 10:20 am. Please arrive early and bring photo ID and current medication. Contact information: [place] [place] [address]        [facility] Follow up.   Why: Use this location for Pharmacy needs. Contact information: [place] [address] [phone]        Claudia Desanctis, MD Follow up in 1 week(s).   Specialty: Nephrology Contact information: [place] [address] [phone]                No Known Allergies  Consultations: Nephrology, IR, urology   Procedures/Studies: CT ABDOMEN PELVIS WO CONTRAST  Result Date:  11/13/2020 CLINICAL DATA:  Diarrhea swelling over proximal right clavicle EXAM: CT CHEST, ABDOMEN AND PELVIS WITHOUT CONTRAST TECHNIQUE: Multidetector CT imaging of the chest, abdomen and pelvis was performed following the standard protocol without IV contrast. COMPARISON:  CT 06/25/2012 FINDINGS: CT CHEST FINDINGS Cardiovascular: Limited evaluation without intravenous contrast. Aorta is nonaneurysmal. Normal cardiac size. No pericardial effusion. Mediastinum/Nodes: Midline trachea. No thyroid mass. No suspicious nodes. Esophagus normal Lungs/Pleura: Lungs are clear. No pleural effusion or pneumothorax. Musculoskeletal: Mild edema and soft tissue swelling/thickening centered at the right sternoclavicular joint. Irregular lucency within the head of the right clavicle. Similar but less extensive changes involving head of left clavicle. Focal heterogeneous lucency within the right first rib, series 4, image 34. Bones appear diffusely dense CT ABDOMEN PELVIS FINDINGS Hepatobiliary: No focal liver abnormality is seen. No gallstones, gallbladder wall thickening, or biliary dilatation. Pancreas: Unremarkable. No pancreatic ductal dilatation or surrounding inflammatory changes. Spleen: Normal in size without focal abnormality. Adrenals/Urinary Tract: Adrenal glands are normal. Status post right nephrectomy. Severe left-sided hydronephrosis and hydroureter with tortuous left ureter dilated down to the level of the bladder. Dilated right ureteral remnant with cystic enlargement of the distal right ureter. Bladder is unremarkable. Stomach/Bowel: Stomach nonenlarged. No dilated small bowel. Negative appendix. No acute bowel wall thickening Vascular/Lymphatic: Nonaneurysmal aorta.  No suspicious nodes Reproductive: Prostate is unremarkable. Other: Negative for pelvic effusion or free air Musculoskeletal: Bones appear diffusely increased in density suggesting metabolic bone disease, possibly due to chronic kidney disease. Chronic  appearing bilateral pars defect at L5. Interval resorptive changes at bilateral SI joints with some sclerosis. No inflammatory changes by CT. IMPRESSION: 1. Edema and soft tissue thickening centered the right sternoclavicular joint with heterogeneous lucency involving the head of the right clavicle. Similar changes involving head of left clavicle though less extensive. Findings could be secondary to osteomyelitis and infection with bone tumor an additional consideration. Further evaluation with MRI could be considered. Additional heterogeneous focal indeterminate lucent lesion in the right first rib posteriorly. 2. Status post right nephrectomy. Severe left-sided hydronephrosis and hydroureter down to the level of the bladder. Interval cortical atrophy of left kidney. There is right ureteral remnant which is also dilated with cystic change distally. Electronically Signed   By: Donavan Foil M.D.   On: 11/13/2020 23:11   CT chest without contrast  Result Date: 11/20/2020 CLINICAL DATA:  Chest wall mass or lump (Ped 0-18y) EXAM: CT CHEST WITHOUT CONTRAST TECHNIQUE: Multidetector CT imaging of the chest was performed following the standard protocol without IV contrast. COMPARISON:  MRI 11/18/2020, chest CT 02/22/2021. FINDINGS: Cardiovascular: Normal cardiac size.No pericardial disease.Normal size main and branch pulmonary arteries.The thoracic aorta is unremarkable. There is a right IJ  catheter with tip within the right atrium. Mediastinum/Nodes: No lymphadenopathy.The thyroid is unremarkable.Esophagus is unremarkable.The trachea is unremarkable. Lungs/Pleura: No focal airspace consolidation.No suspicious pulmonary nodules or masses.No pleural effusion.No pneumothorax. Upper Abdomen: No acute abnormality. Partially visualized left renal hydronephrosis as described on prior CT abdomen and pelvis. Musculoskeletal: Bony erosive/destructive changes involving the medial clavicle at the sternoclavicular joint.Minimal  right [place] joint distension. This is similar appearance to prior chest CT on August 11th. Unchanged appearance of the left medial clavicle. Unchanged mild lucency in the right posterior first rib which has a benign appearance. Right-sided os acromiale noted. 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 IMPRESSION: Unchanged erosive/destructive changes of the right medial clavicle with adjacent soft tissue thickening and edema at the sternoclavicular joint. Findings could reflect inflammatory/crystalline arthropathy or septic [place] joint with osteomyelitis of the right medial clavicle. Amyloid arthropathy is also possible given history of ESRD and presence of renal osteodystrophy. Unchanged CT appearance of the left medial clavicle, which demonstrated marrow edema on prior MRI. Electronically Signed   By: Maurine Simmering M.D.   On: 11/20/2020 16:04   CT Chest Wo Contrast  Result Date: 11/13/2020 CLINICAL DATA:  Diarrhea swelling over proximal right clavicle EXAM: CT CHEST, ABDOMEN AND PELVIS WITHOUT CONTRAST TECHNIQUE: Multidetector CT imaging of the chest, abdomen and pelvis was performed following the standard protocol without IV contrast. COMPARISON:  CT 06/25/2012 FINDINGS: CT CHEST FINDINGS Cardiovascular: Limited evaluation without intravenous contrast. Aorta is nonaneurysmal. Normal cardiac size. No pericardial effusion. Mediastinum/Nodes: Midline trachea. No thyroid mass. No suspicious nodes. Esophagus normal Lungs/Pleura: Lungs are clear. No pleural effusion or pneumothorax. Musculoskeletal: Mild edema and soft tissue swelling/thickening centered at the right sternoclavicular joint. Irregular lucency within the head of the right clavicle. Similar but less extensive changes involving head of left clavicle. Focal heterogeneous lucency within the right first rib, series 4, image 34. Bones appear diffusely dense CT ABDOMEN PELVIS FINDINGS Hepatobiliary: No focal liver abnormality is seen. No  gallstones, gallbladder wall thickening, or biliary dilatation. Pancreas: Unremarkable. No pancreatic ductal dilatation or surrounding inflammatory changes. Spleen: Normal in size without focal abnormality. Adrenals/Urinary Tract: Adrenal glands are normal. Status post right nephrectomy. Severe left-sided hydronephrosis and hydroureter with tortuous left ureter dilated down to the level of the bladder. Dilated right ureteral remnant with cystic enlargement of the distal right ureter. Bladder is unremarkable. Stomach/Bowel: Stomach nonenlarged. No dilated small bowel. Negative appendix. No acute bowel wall thickening Vascular/Lymphatic: Nonaneurysmal aorta.  No suspicious nodes Reproductive: Prostate is unremarkable. Other: Negative for pelvic effusion or free air Musculoskeletal: Bones appear diffusely increased in density suggesting metabolic bone disease, possibly due to chronic kidney disease. 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 Interval resorptive changes at bilateral SI joints with some sclerosis. No inflammatory changes by CT. IMPRESSION: 1. Edema and soft tissue thickening centered the right sternoclavicular joint with heterogeneous lucency involving the head of the right clavicle. Similar changes involving head of left clavicle though less extensive. Findings could be secondary to osteomyelitis and infection with bone tumor an additional consideration. Further evaluation with MRI could be considered. Additional heterogeneous focal indeterminate lucent lesion in the right first rib posteriorly. 2. Status post right nephrectomy. Severe left-sided hydronephrosis and hydroureter down to the level of the bladder. Interval cortical atrophy of left kidney. There is right ureteral remnant which is also dilated with cystic change distally. Electronically Signed   By: Donavan Foil M.D.   On: 11/13/2020 23:11   MR STERNUM WO CONTRAST  Result Date:  11/18/2020 CLINICAL DATA:  Abnormal right  sternoclavicular joint on CT. EXAM: MR CHEST WITHOUT CONTRAST TECHNIQUE: Multiplanar, multisequence MR imaging of the sternum was performed. No intravenous contrast was administered. COMPARISON:  CT chest dated November 13, 2020. FINDINGS: Bones/Joint/Cartilage Fairly symmetric mild marrow edema in both clavicular heads with mild surrounding soft tissue swelling. No fracture or dislocation. Small bilateral sternoclavicular joint effusions. Ligaments Sternoclavicular ligaments are intact. Muscles and Tendons Intact.  Mild muscle edema around the right sternoclavicular joint. Soft tissue No fluid collection or hematoma.  No soft tissue mass. IMPRESSION: 1. Fairly symmetric marrow edema in both clavicular heads with mild surrounding soft tissue swelling. While nonspecific, the relatively symmetric appearance argues against infection and would more favor an inflammatory or crystal arthropathy. However, osteomyelitis of the right clavicular head remains in the differential given unilateral erosive appearance on CT. No abscess. Electronically Signed   By: Titus Dubin M.D.   On: 11/18/2020 09:56   IR Fluoro Guide CV Line Right  Result Date: 11/18/2020 INDICATION: End-stage renal disease EXAM: Ultrasound fluoroscopy guided tunneled IJ hemodialysis catheter placement MEDICATIONS: Ancef 2 gm IV; The antibiotic was administered within an appropriate time interval prior to skin puncture. ANESTHESIA/SEDATION: Moderate (conscious) sedation was employed during this procedure. A total of Versed 2 mg and Fentanyl 100 mcg was administered intravenously. Moderate Sedation Time: 9 minutes. The patient's level of consciousness and vital signs were monitored continuously by radiology nursing throughout the procedure under my direct supervision. FLUOROSCOPY TIME:  Fluoroscopy Time: 0 minutes 48 seconds (3 mGy). COMPLICATIONS: None immediate. PROCEDURE: Informed written consent was obtained from the patient after a thorough  discussion of the procedural risks, benefits and alternatives. All questions were addressed. Maximal Sterile Barrier Technique was utilized including caps, mask, sterile gowns, sterile gloves, sterile drape, hand hygiene and skin antiseptic. A timeout was performed prior to the initiation of the procedure. The right internal jugular vein was evaluated with ultrasound and shown to be patent. A permanent ultrasound image was obtained and placed in the patient's medical record. Using sterile gel and a sterile probe cover, the right internal jugular vein was entered with a 21 ga needle during real time ultrasound guidance. 0.018 inch guidewire placed and 21 ga needle exchanged for transitional dilator set. Utilizing fluoroscopy, 0.035 inch guidewire advanced through the needle without difficulty. Seriel dilation was performed and peel-away sheath was placed. Attention then turned to the right anterior upper chest. Following local lidocaine administration, the hemodialysis catheter was tunneled from the chest wall to the venotomy site. The catheter was inserted through the peel-away sheath. The tip of the catheter was positioned within the right atrium using fluoroscopic guidance. All lumens of the catheter aspirated and flushed well. The dialysis lumens were locked with Heparin. The catheter was secured to the skin with suture. The insertion site was covered with a Biopatch and sterile dressing. IMPRESSION: 19 cm tunneled right IJ hemodialysis catheter ready for use. Electronically Signed   By: Miachel Roux M.D.   On: 11/18/2020 15:42   IR US Guide Vasc Access Right  Result Date: 11/18/2020 INDICATION: End-stage renal disease EXAM: Ultrasound fluoroscopy guided tunneled IJ hemodialysis catheter placement MEDICATIONS: Ancef 2 gm IV; The antibiotic was administered within an appropriate time interval prior to skin puncture. ANESTHESIA/SEDATION: Moderate (conscious) sedation was employed during this procedure. A total  of Versed 2 mg and Fentanyl 100 mcg was administered intravenously. Moderate Sedation Time: 9 minutes. The patient's level of consciousness and vital signs were monitored continuously by  radiology nursing throughout the procedure under my direct supervision. FLUOROSCOPY TIME:  Fluoroscopy Time: 0 minutes 48 seconds (3 mGy). COMPLICATIONS: None immediate. PROCEDURE: Informed written consent was obtained from the patient after a thorough discussion of the procedural risks, benefits and alternatives. All questions were addressed. Maximal Sterile Barrier Technique was utilized including caps, mask, sterile gowns, sterile gloves, sterile drape, hand hygiene and skin antiseptic. A timeout was performed prior to the initiation of the procedure. The right internal jugular vein was evaluated with ultrasound and shown to be patent. A permanent ultrasound image was obtained and placed in the patient's medical record. Using sterile gel and a sterile probe cover, the right internal jugular vein was entered with a 21 ga needle during real time ultrasound guidance. 0.018 inch guidewire placed and 21 ga needle exchanged for transitional dilator set. Utilizing fluoroscopy, 0.035 inch guidewire advanced through the needle without difficulty. Seriel dilation was performed and peel-away sheath was placed. Attention then turned to the right anterior upper chest. Following local lidocaine administration, the hemodialysis catheter was tunneled from the chest wall to the venotomy site. The catheter was inserted through the peel-away sheath. The tip of the catheter was positioned within the right atrium using fluoroscopic guidance. All lumens of the catheter aspirated and flushed well. The dialysis lumens were locked with Heparin. The catheter was secured to the skin with suture. The insertion site was covered with a Biopatch and sterile dressing. IMPRESSION: 19 cm tunneled right IJ hemodialysis catheter ready for use. Electronically Signed    By: Miachel Roux M.D.   On: 11/18/2020 15:42   DG Chest Portable 1 View  Result Date: 11/13/2020 CLINICAL DATA:  Evaluate proximal right clavicular head. EXAM: PORTABLE CHEST 1 VIEW COMPARISON:  None. FINDINGS: No gross abnormality of the proximal right clavicular head is evident within the limitations of a single portable frontal radiograph of the chest. Low lung volumes with streaky opacities favoring atelectatic change. No consolidative process. No pneumothorax or layering effusion. The cardiomediastinal contours are unremarkable. No acute osseous or soft tissue abnormality. Telemetry leads overlie the chest. IMPRESSION: No gross abnormality of the proximal right clavicular head though evaluation is limited on this single view portable chest radiograph. Could consider dedicated clavicular radiographs or cross-sectional imaging if there is persisting clinical concern. Electronically Signed   By: Lovena Le M.D.   On: 11/13/2020 22:36   DG Bone Survey Met  Result Date: 11/21/2020 CLINICAL DATA:  Right chest wall mass, destructive right clavicular lesion on recent CT and MRI EXAM: METASTATIC BONE SURVEY COMPARISON:  11/13/2020, 11/18/2020, 11/20/2020 FINDINGS: Standard skeletal survey was performed. Lateral skull: Innumerable punctate lucencies are seen throughout the calvarium in a configuration consistent with salt and pepper appearance. No other destructive lesions. No acute bony abnormality. Upper extremities: Frontal views of the bilateral upper extremities from the shoulders through the wrists are obtained. There is symmetrical resorption of the distal clavicles abutting the acromioclavicular joints, with normal appearance of the acromion process. No other acute or destructive bony abnormalities. Spine: Frontal and lateral views of the cervical, thoracic, and lumbar spine was performed. There are no acute or destructive bony abnormalities. Disc spaces are well preserved. Paraspinal soft tissues are  unremarkable. High attenuation material overlying the central upper abdomen consistent with gastric contents. Chest: Frontal view of the chest demonstrates stable right internal jugular dialysis catheter. Cardiac silhouette is unremarkable. No airspace disease, effusion, or pneumothorax. No acute bony abnormalities. The lytic process involving the medial right clavicle seen on  CT is less well appreciated by radiograph. Pelvis: Single frontal view of the pelvis demonstrates no acute bony abnormality. Symmetrical erosive changes are seen involving the bilateral sacroiliac joints. Joint spaces are otherwise well preserved. Lower extremities: Frontal views of the bilateral lower extremities from the hips through the ankles are obtained. There are small areas of subperiosteal bone resorption along the medial aspect of the proximal tibial metadiaphyseal junctions bilaterally. No other acute or destructive bony abnormalities. Joint spaces are well preserved. IMPRESSION: 1. 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 2. No acute fractures. Electronically Signed   By: Randa Ngo M.D.   On: 11/21/2020 17:27   VAS [place] LOWER EXTREMITY VENOUS (DVT)  Result Date: 11/15/2020  Lower Venous DVT Study Patient Name:  Micheal Bean  Date of Exam:   11/14/2020 Medical Rec #: [id number]      Accession #:    [id number] Date of Birth: [birth year]      Patient Gender: M Patient Age:   69 years Exam Location:  [facility] Procedure:      VAS [place] LOWER EXTREMITY VENOUS (DVT) Referring Phys: Sheppard Coil MELVIN --------------------------------------------------------------------------------  Indications: Elevated D-dimer.  Comparison Study: No previous exams Performing Technologist: Darlin Coco  Examination Guidelines: A complete evaluation includes B-mode  imaging, spectral Doppler, color Doppler, and power Doppler as needed of all accessible portions of each vessel. Bilateral testing is considered an integral part of a complete examination. Limited examinations for reoccurring indications may be performed as noted. The reflux portion of the exam is performed with the patient in reverse Trendelenburg.  +---------+---------------+---------+-----------+----------+--------------+ RIGHT    CompressibilityPhasicitySpontaneityPropertiesThrombus Aging +---------+---------------+---------+-----------+----------+--------------+ CFV      Full           Yes      Yes                                 +---------+---------------+---------+-----------+----------+--------------+ SFJ      Full                                                        +---------+---------------+---------+-----------+----------+--------------+ FV Prox  Full                                                        +---------+---------------+---------+-----------+----------+--------------+ FV Mid   Full                                                        +---------+---------------+---------+-----------+----------+--------------+ FV DistalFull                                                        +---------+---------------+---------+-----------+----------+--------------+  PFV      Full                                                        +---------+---------------+---------+-----------+----------+--------------+ POP      Full           Yes      Yes                                 +---------+---------------+---------+-----------+----------+--------------+ PTV      Full                                                        +---------+---------------+---------+-----------+----------+--------------+ PERO     Full                                                        +---------+---------------+---------+-----------+----------+--------------+  Gastroc  Full                                                        +---------+---------------+---------+-----------+----------+--------------+   +---------+---------------+---------+-----------+----------+--------------+ LEFT     CompressibilityPhasicitySpontaneityPropertiesThrombus Aging +---------+---------------+---------+-----------+----------+--------------+ CFV      Full           Yes      Yes                                 +---------+---------------+---------+-----------+----------+--------------+ SFJ      Full                                                        +---------+---------------+---------+-----------+----------+--------------+ FV Prox  Full                                                        +---------+---------------+---------+-----------+----------+--------------+ FV Mid   Full                                                        +---------+---------------+---------+-----------+----------+--------------+ FV DistalFull                                                        +---------+---------------+---------+-----------+----------+--------------+  PFV      Full                                                        +---------+---------------+---------+-----------+----------+--------------+ POP      Full           Yes      Yes                                 +---------+---------------+---------+-----------+----------+--------------+ PTV      Full                                                        +---------+---------------+---------+-----------+----------+--------------+ PERO     Full                                                        +---------+---------------+---------+-----------+----------+--------------+ Gastroc  Full                                                        +---------+---------------+---------+-----------+----------+--------------+     Summary: BILATERAL: - No evidence of deep  vein thrombosis seen in the lower extremities, bilaterally. - No evidence of superficial venous thrombosis in the lower extremities, bilaterally. -No evidence of popliteal cyst, bilaterally.   *See table(s) above for measurements and observations. Electronically signed by Harold Barban MD on 11/15/2020 at 1:27:53 PM.    Final       Subjective: Patient was seen and examined at hemodialysis unit.  He reports feeling much improved.   No overnight events.  He is able to urinate by self.  He feels better and want to be discharged. outpatient hemodialysis set up been arranged.  Discharge Exam: Vitals:   11/24/20 1125 11/24/20 1131  BP: 109/71 118/85  Pulse: 80 78  Resp: (!) 21 16  Temp: 98.1 F (36.7 C) 98.3 F (36.8 C)  SpO2: 99% 100%   Vitals:   11/24/20 1000 11/24/20 1030 11/24/20 1125 11/24/20 1131  BP: (!) 117/98 102/80 109/71 118/85  Pulse: 91 (!) 105 80 78  Resp:   (!) 21 16  Temp:   98.1 F (36.7 C) 98.3 F (36.8 C)  TempSrc:   Oral Oral  SpO2:   99% 100%  Weight:   77.2 kg   Height:        General: Pt is alert, awake, not in acute distress, right chest TDC noted Cardiovascular: RRR, S1/S2 +, no rubs, no gallops Respiratory: CTA bilaterally, no wheezing, no rhonchi Abdominal: Soft, NT, ND, bowel sounds + Extremities: no edema, no cyanosis    The results of significant diagnostics from this hospitalization (including imaging, microbiology, ancillary and laboratory) are listed below for reference.     Microbiology: Recent Results (from the past 240 hour(s))  Culture, blood (Routine X 2) w Reflex to ID Panel     Status: None   Collection Time: 11/14/20 11:20 PM   Specimen: BLOOD LEFT FOREARM  Result Value Ref Range Status   Specimen Description BLOOD LEFT FOREARM  Final   Special Requests   Final    BOTTLES DRAWN AEROBIC ONLY Blood Culture adequate volume   Culture   Final    NO GROWTH 5 DAYS Performed at [facility], [address], [place], [place]  [address]    Report Status 11/20/2020 FINAL  Final     Labs: BNP (last 3 results) No results for input(s): BNP in the last 8760 hours. Basic Metabolic Panel: Recent Labs  Lab 11/18/20 0953 11/19/20 0319 11/20/20 0717 11/21/20 1606 11/22/20 0320 11/24/20 0745  NA 138 138 136 137 134* 137  K 3.7 3.5 3.6 4.0 3.8 3.7  CL 102 102 101 100 100 101  CO2 17* 23 21* $Remo'24 26 26  'WUrwy$ GLUCOSE 83 83 91 86 85 145*  BUN 115* 70* 73* 49* 22* 38*  CREATININE 11.87* 8.34* 9.82* 7.98* 5.10* 7.82*  CALCIUM 9.2 9.3 9.2 9.0 9.1 8.9  MG 1.9 1.9  --   --  2.0  --   PHOS 9.5* 7.4* 8.6* 7.2* 6.4* 5.5*   Liver Function Tests: Recent Labs  Lab 11/19/20 0319 11/20/20 0717 11/21/20 1606 11/24/20 0745  AST 17  --   --   --   ALT 10  --   --   --   ALKPHOS 118  --   --   --   BILITOT 0.6  --   --   --   PROT 7.0  --   --   --   ALBUMIN 2.6* 2.6* 2.7* 2.6*   No results for input(s): LIPASE, AMYLASE in the last 168 hours. No results for input(s): AMMONIA in the last 168 hours. CBC: Recent Labs  Lab 11/19/20 0319 11/20/20 0717 11/21/20 1606 11/22/20 0320 11/24/20 0745  WBC 11.2* 12.2* 12.8* 10.2 11.6*  NEUTROABS 8.7*  --   --   --   --   HGB 9.0* 9.4* 8.9* 9.2* 8.7*  HCT 27.7* 28.7* 29.4* 29.0* 28.5*  MCV 88.2 90.0 93.9 91.2 93.8  PLT 286 291 318 287 276   Cardiac Enzymes: No results for input(s): CKTOTAL, CKMB, CKMBINDEX, TROPONINI in the last 168 hours. BNP: Invalid input(s): POCBNP CBG: No results for input(s): GLUCAP in the last 168 hours. D-Dimer No results for input(s): DDIMER in the last 72 hours. Hgb A1c No results for input(s): HGBA1C in the last 72 hours. Lipid Profile No results for input(s): CHOL, HDL, LDLCALC, TRIG, CHOLHDL, LDLDIRECT in the last 72 hours. Thyroid function studies No results for input(s): TSH, T4TOTAL, T3FREE, THYROIDAB in the last 72 hours.  Invalid input(s): FREET3 Anemia work up Recent Labs    11/21/20 1606  RETICCTPCT 2.5   Urinalysis     Component Value Date/Time   COLORURINE YELLOW 11/13/2020 1931   APPEARANCEUR CLOUDY (A) 11/13/2020 1931   LABSPEC 1.009 11/13/2020 1931   PHURINE 5.0 11/13/2020 1931   GLUCOSEU NEGATIVE 11/13/2020 1931   HGBUR SMALL (A) 11/13/2020 1931   BILIRUBINUR NEGATIVE 11/13/2020 1931   KETONESUR NEGATIVE 11/13/2020 1931   PROTEINUR 100 (A) 11/13/2020 1931   UROBILINOGEN 0.2 06/25/2012 1826   NITRITE POSITIVE (A) 11/13/2020 1931   LEUKOCYTESUR LARGE (A) 11/13/2020 1931   Sepsis Labs Invalid input(s): PROCALCITONIN,  WBC,  LACTICIDVEN Microbiology Recent Results (from  the past 240 hour(s))  Culture, blood (Routine X 2) w Reflex to ID Panel     Status: None   Collection Time: 11/14/20 11:20 PM   Specimen: BLOOD LEFT FOREARM  Result Value Ref Range Status   Specimen Description BLOOD LEFT FOREARM  Final   Special Requests   Final    BOTTLES DRAWN AEROBIC ONLY Blood Culture adequate volume   Culture   Final    NO GROWTH 5 DAYS Performed at [facility], [address] [place],  [address]    Report Status 11/20/2020 FINAL  Final     Time coordinating discharge: Over 30 minutes  SIGNED:   Shawna Clamp, MD  [facility] 11/24/2020, 1:48 PM Pager   If 7PM-7AM, please contact night-coverage

## 2020-11-24 NOTE — TOC Transition Note (Signed)
Transition of Care [facility]) - CM/SW Discharge Note   Patient Details  Name: Micheal Bean MRN: [id number] Date of Birth: October 19, 1985  Transition of Care [facility]) CM/SW Contact:  Tom-Johnson, Renea Ee, RN Phone Number: 11/24/2020, 12:51 PM   Clinical Narrative:    Patient is accepted @ [facility] on their TTS shift at 5:20 am. Spoke with patient and mother at bedside. Mother will be transporting patient to an from dialysis. Medicaid approval still pending at this time. Patient and mother denies any other needs. No further CM needs at this time.    Final next level of care: Home/Self Care (Outpatient dialysis arranged.) Barriers to Discharge: No Barriers Identified   Patient Goals and CMS Choice Patient states their goals for this hospitalization and ongoing recovery are:: To go home   Choice offered to / list presented to : NA  Discharge Placement                       Discharge Plan and Services In-house Referral: Clinical Social Work Discharge Planning Services: CM Consult            DME Arranged: N/A DME Agency: NA       HH Arranged: NA [place] Agency: NA        Social Determinants of Health (SDOH) Interventions     Readmission Risk Interventions No flowsheet data found.

## 2020-11-24 NOTE — Progress Notes (Signed)
[facility] PROGRESS NOTE  Assessment/ Plan:  # New ESRD: Likely progression partly from obstruction in the setting of a solitary kidney and renal function without significant recovery after Foley placement.  He was started on hemodialysis via his right IJ [facility]; he hopes to transition to peritoneal dialysis to maintain independence/mobility as an outpatient (will be followed by Dr.Foster).  Process initiated for outpatient dialysis unit placement (TCU). COnt HD on MWF schedule while inpatient.   #  Anemia: Secondary to chronic kidney disease/iron deficiency.  Status post intravenous iron and started on Darbe 60 qMon. Monitor Hb.   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 Per primary team.  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 Renal diet. Improving, CTM  # Acute urinary retention: dc foley cath by urologist, urodynamics study as outpatient.  Subjective:   No c/o or needs, seen on HD: 4K, TDC.  2L UF goal CLIP Pending, ? To TCU  Objective Vital signs in last 24 hours: Vitals:   11/23/20 1743 11/23/20 2046 11/24/20 0534 11/24/20 0652  BP: 135/87 128/81 (!) 142/81   Pulse: 89 80 72   Resp: '18 17 17   '$ Temp: 98.3 F (36.8 C) 98.4 F (36.9 C) 98.4 F (36.9 C)   TempSrc: Oral Oral Oral   SpO2: 100% 97% 99%   Weight:    75.6 kg  Height:       Weight change:   Intake/Output Summary (Last 24 hours) at 11/24/2020 0824 Last data filed at 11/23/2020 2200 Gross per 24 hour  Intake 660 ml  Output 0 ml  Net 660 ml        Labs: Basic Metabolic Panel: Recent Labs  Lab 11/20/20 0717 11/21/20 1606 11/22/20 0320  NA 136 137 134*  K 3.6 4.0 3.8  CL 101 100 100  CO2 21* 24 26  GLUCOSE 91 86 85  BUN 73* 49* 22*   CREATININE 9.82* 7.98* 5.10*  CALCIUM 9.2 9.0 9.1  PHOS 8.6* 7.2* 6.4*    Liver Function Tests: Recent Labs  Lab 11/19/20 0319 11/20/20 0717 11/21/20 1606  AST 17  --   --   ALT 10  --   --   ALKPHOS 118  --   --   BILITOT 0.6  --   --   PROT 7.0  --   --   ALBUMIN 2.6* 2.6* 2.7*    No results for input(s): LIPASE, AMYLASE in the last 168 hours. No results for input(s): AMMONIA in the last 168 hours. CBC: Recent Labs  Lab 11/19/20 0319 11/20/20 0717 11/21/20 1606 11/22/20 0320 11/24/20 0745  WBC 11.2* 12.2* 12.8* 10.2 11.6*  NEUTROABS 8.7*  --   --   --   --   HGB 9.0* 9.4* 8.9* 9.2* 8.7*  HCT 27.7* 28.7* 29.4* 29.0* 28.5*  MCV 88.2 90.0 93.9 91.2 93.8  PLT 286 291 318 287 276    Cardiac Enzymes: No results for input(s): CKTOTAL, CKMB, CKMBINDEX, TROPONINI in the last 168 hours.  CBG: No results for input(s): GLUCAP in the last 168 hours.  Iron Studies: No results for input(s): IRON, TIBC, TRANSFERRIN, FERRITIN in the last 72 hours. Studies/Results: No results found.  Medications: Infusions:  sodium chloride     sodium chloride     sodium chloride      Scheduled Medications:  Chlorhexidine  Gluconate Cloth  6 each Topical Q0600   Chlorhexidine Gluconate Cloth  6 each Topical Q0600   darbepoetin (ARANESP) injection - DIALYSIS  60 mcg Intravenous Q Mon-HD   feeding supplement (NEPRO CARB STEADY)  237 mL Oral BID BM   lanthanum  1,000 mg Oral TID WC   multivitamin  1 tablet Oral QHS   sodium chloride flush  3 mL Intravenous Q12H   vitamin B-12  1,000 mcg Oral Daily    have reviewed scheduled and prn medications.  Physical Exam: General:NAD, looks comfortable in bed at HD unit Heart:RRR, s1s2 nl Lungs:clear b/l, no crackle Abdomen:soft, Non-tender, non-distended Extremities:No edema Dialysis Access: RIJ TDC   Micheal Bean B Micheal Bean 11/24/2020,8:24 AM  LOS: 10 days

## 2020-11-24 NOTE — Progress Notes (Signed)
DISCHARGE NOTE HOME Eusebio L Felland to be discharged Home per MD order. Discussed prescriptions and follow up appointments with the patient. Prescriptions given to patient; medication list explained in detail. Patient verbalized understanding.  Skin clean, dry and intact without evidence of skin break down, no evidence of skin tears noted. IV catheter discontinued intact. Site without signs and symptoms of complications. Dressing and pressure applied. Pt denies pain at the site currently. No complaints noted.  Patient free of lines, drains, and wounds.   An After Visit Summary (AVS) was printed and given to the patient. Patient escorted via wheelchair, and discharged home via private auto.  Berneta Levins, RN

## 2020-11-26 ENCOUNTER — Telehealth: Payer: Self-pay | Admitting: Nephrology

## 2020-11-26 LAB — UPEP/UIFE/LIGHT CHAINS/TP, 24-HR UR
Free Kappa Lt Chains,Ur: 229.25 mg/L — ABNORMAL HIGH (ref 1.17–86.46)
Free Kappa/Lambda Ratio: 3.53 (ref 1.83–14.26)
Free Lambda Lt Chains,Ur: 64.97 mg/L — ABNORMAL HIGH (ref 0.27–15.21)
Total Protein, Urine-Ur/day: 1574 mg/24 hr — ABNORMAL HIGH (ref 30–150)
Total Protein, Urine: 224.9 mg/dL
Total Volume: 700

## 2020-11-26 NOTE — Telephone Encounter (Signed)
Transition of care contact from inpatient facility  Date of discharge: 11/24/20 Date of contact: 11/26/20 Method: Phone Spoke to: Patient  Patient contacted to discuss transition of care from recent inpatient hospitalization. Patient was admitted to [facility] from .8/11-8/22/22.Marland Kitchen with discharge diagnosis of progressive ESRD  start HD   Medication changes were reviewed.  Patient will follow up with his/her outpatient HD unit on: 11/27/20

## 2020-12-16 ENCOUNTER — Telehealth: Payer: Self-pay | Admitting: Nurse Practitioner

## 2020-12-16 ENCOUNTER — Inpatient Hospital Stay: Payer: Self-pay | Admitting: Family Medicine

## 2020-12-16 ENCOUNTER — Other Ambulatory Visit: Payer: Self-pay

## 2020-12-18 ENCOUNTER — Inpatient Hospital Stay: Payer: Self-pay | Admitting: Nurse Practitioner

## 2021-01-01 ENCOUNTER — Other Ambulatory Visit: Payer: Self-pay

## 2021-01-01 ENCOUNTER — Ambulatory Visit: Payer: Self-pay | Admitting: Family Medicine

## 2021-01-01 ENCOUNTER — Encounter: Payer: Self-pay | Admitting: Family Medicine

## 2021-01-01 ENCOUNTER — Ambulatory Visit (INDEPENDENT_AMBULATORY_CARE_PROVIDER_SITE_OTHER): Payer: Self-pay | Admitting: Family Medicine

## 2021-01-01 VITALS — BP 107/74 | HR 109 | Temp 98.4°F | Resp 16 | Ht 73.0 in | Wt 165.2 lb

## 2021-01-01 DIAGNOSIS — N529 Male erectile dysfunction, unspecified: Secondary | ICD-10-CM

## 2021-01-01 DIAGNOSIS — Z7689 Persons encountering health services in other specified circumstances: Secondary | ICD-10-CM

## 2021-01-01 DIAGNOSIS — N19 Unspecified kidney failure: Secondary | ICD-10-CM

## 2021-01-01 MED ORDER — SILDENAFIL CITRATE 100 MG PO TABS: 50.0000 mg | ORAL_TABLET | Freq: Every day | ORAL | 5 refills | Status: DC | PRN

## 2021-01-01 NOTE — Progress Notes (Signed)
Patient is here to [facility] care Patient would like to discuss ED Patient would like to discuss fluid intake and yellowish eyes

## 2021-01-01 NOTE — Progress Notes (Signed)
New Patient Office Visit  Subjective:  Patient ID: Micheal Bean, male    DOB: 1985-12-12  Age: 35 y.o. MRN: [id number]  CC:  Chief Complaint  Patient presents with   Erectile Dysfunction   Chronic Kidney Disease   Establish Care    HPI Micheal Bean presents for to establish care. 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 He also complains of ED. He reports that it really became a concern after he was hospitalized and had a foley that he tried to pull out.   Past Medical History:  Diagnosis Date   Renal disorder    only has right kidney    Past Surgical History:  Procedure Laterality Date   IR FLUORO GUIDE CV LINE RIGHT  11/18/2020   IR US GUIDE VASC ACCESS RIGHT  11/18/2020   NEPHRECTOMY     childhhod, RT side    Family History  Problem Relation Age of Onset   Hypertension Mother    Kidney disease Maternal Grandmother     Social History   Socioeconomic History   Marital status: Single    Spouse name: Not on file   Number of children: Not on file   Years of education: Not on file   Highest education level: Not on file  Occupational History   Not on file  Tobacco Use   Smoking status: Never   Smokeless tobacco: Never  Substance and Sexual Activity   Alcohol use: Not Currently    Comment: occassional   Drug use: Yes    Types: Marijuana   Sexual activity: Not on file  Other Topics Concern   Not on file  Social History Narrative   Not on file   Social Determinants of Health   Financial Resource Strain: Not on file  Food Insecurity: Not on file  Transportation Needs: Not on file  Physical Activity: Not on file  Stress: Not on file  Social Connections: Not on file  Intimate Partner Violence: Not on file    ROS Review of Systems  All other systems reviewed and are negative.  Objective:   Today's Vitals: BP 107/74 (BP Location: Right Arm, Patient Position: Sitting, Cuff Size: Large)   Pulse (!) 109   Temp 98.4 F (36.9 C)  (Oral)   Resp 16   Ht '6\' 1"'$  (1.854 m)   Wt 165 lb 3.2 oz (74.9 kg)   BMI 21.80 kg/m   Physical Exam Vitals and nursing note reviewed.  Constitutional:      General: He is not in acute distress. Cardiovascular:     Rate and Rhythm: Normal rate and regular rhythm.  Pulmonary:     Effort: Pulmonary effort is normal.     Breath sounds: Normal breath sounds.  Abdominal:     Palpations: Abdomen is soft.     Tenderness: There is no abdominal tenderness.  Neurological:     General: No focal deficit present.     Mental Status: He is alert and oriented to person, place, and time.    Assessment & Plan:   1. Erectile dysfunction, unspecified erectile dysfunction type Viagra prescribed  2. Renal failure, unspecified chronicity Management as per consultant  3. Encounter to establish care    Outpatient Encounter Medications as of 01/01/2021  Medication Sig   lanthanum (FOSRENOL) 1000 MG chewable tablet Chew 1 tablet (1,000 mg total) by mouth 3 (three) times daily with meals.   multivitamin (RENA-VIT) TABS tablet Take 1 tablet by mouth at  bedtime.   SENSIPAR 30 MG tablet Take 60 mg by mouth daily.   sevelamer carbonate (RENVELA) 800 MG tablet Take 1,600 mg by mouth 3 (three) times daily.   vitamin B-12 1000 MCG tablet Take 1 tablet (1,000 mcg total) by mouth daily.   No facility-administered encounter medications on file as of 01/01/2021.    Follow-up: Return in about 4 months (around 05/03/2021) for follow up.   Becky Sax, MD

## 2021-01-05 ENCOUNTER — Telehealth: Payer: Self-pay | Admitting: Family Medicine

## 2021-01-05 NOTE — Telephone Encounter (Signed)
Seeking referral for Nephrology- seeking to get Kidney transplant.

## 2021-01-06 NOTE — Telephone Encounter (Signed)
Last OV 01/01/2021. Pls advise if applicable.

## 2021-01-08 NOTE — Telephone Encounter (Signed)
Unable to reach, voicemail not set up.

## 2021-01-12 ENCOUNTER — Other Ambulatory Visit: Payer: Self-pay

## 2021-01-12 ENCOUNTER — Ambulatory Visit (INDEPENDENT_AMBULATORY_CARE_PROVIDER_SITE_OTHER): Payer: Medicaid Other | Admitting: Family Medicine

## 2021-01-12 ENCOUNTER — Encounter: Payer: Self-pay | Admitting: Family Medicine

## 2021-01-12 VITALS — BP 115/80 | HR 76 | Temp 98.0°F | Resp 16 | Wt 178.4 lb

## 2021-01-12 DIAGNOSIS — N19 Unspecified kidney failure: Secondary | ICD-10-CM | POA: Diagnosis not present

## 2021-01-12 DIAGNOSIS — N529 Male erectile dysfunction, unspecified: Secondary | ICD-10-CM

## 2021-01-12 MED ORDER — RENA-VITE PO TABS: 1.0000 | ORAL_TABLET | Freq: Every day | ORAL | 1 refills | Status: DC

## 2021-01-12 MED ORDER — CYANOCOBALAMIN 1000 MCG PO TABS: 1000.0000 ug | ORAL_TABLET | Freq: Every day | ORAL | 1 refills | Status: DC

## 2021-01-12 NOTE — Progress Notes (Signed)
Patient support person is [place] (sister)  Patient is looking for a referral to [facility] and [facility] Nephrology

## 2021-01-12 NOTE — Progress Notes (Signed)
Established  Patient Office Visit  Subjective:  Patient ID: Micheal Bean, male    DOB: 1985-11-25  Age: 35 y.o. MRN: [id number]  CC:  Chief Complaint  Patient presents with   Follow-up    CKD    HPI Micheal Bean presents for complaint of wanting referral for transplant consultation. Patient also reports that viagra was effective for his ED sx. Denies acute complaints.   Past Medical History:  Diagnosis Date   Renal disorder    only has right kidney    Past Surgical History:  Procedure Laterality Date   IR FLUORO GUIDE CV LINE RIGHT  11/18/2020   IR US GUIDE VASC ACCESS RIGHT  11/18/2020   NEPHRECTOMY     childhhod, RT side    Family History  Problem Relation Age of Onset   Hypertension Mother    Kidney disease Maternal Grandmother     Social History   Socioeconomic History   Marital status: Single    Spouse name: Not on file   Number of children: Not on file   Years of education: Not on file   Highest education level: Not on file  Occupational History   Not on file  Tobacco Use   Smoking status: Never   Smokeless tobacco: Never  Substance and Sexual Activity   Alcohol use: Not Currently    Comment: occassional   Drug use: Yes    Types: Marijuana   Sexual activity: Not on file  Other Topics Concern   Not on file  Social History Narrative   Not on file   Social Determinants of Health   Financial Resource Strain: Not on file  Food Insecurity: Not on file  Transportation Needs: Not on file  Physical Activity: Not on file  Stress: Not on file  Social Connections: Not on file  Intimate Partner Violence: Not on file    ROS Review of Systems  Psychiatric/Behavioral:  Negative for self-injury, sleep disturbance and suicidal ideas. The patient is not nervous/anxious.   All other systems reviewed and are negative.  Objective:   Today's Vitals: BP 115/80   Pulse 76   Temp 98 F (36.7 C) (Oral)   Resp 16   Wt 178 lb 6.4 oz (80.9 kg)   SpO2 98%    BMI 23.54 kg/m   Physical Exam Vitals and nursing note reviewed.  Constitutional:      General: He is not in acute distress. Cardiovascular:     Rate and Rhythm: Normal rate and regular rhythm.  Pulmonary:     Effort: Pulmonary effort is normal.     Breath sounds: Normal breath sounds.  Abdominal:     Palpations: Abdomen is soft.     Tenderness: There is no abdominal tenderness.  Neurological:     General: No focal deficit present.     Mental Status: He is alert and oriented to person, place, and time.    Assessment & Plan:   1. Renal failure, unspecified chronicity Referral to nephrology for transplant consultation  2. Erectile dysfunction, unspecified erectile dysfunction type Improved. Continue present management  Outpatient Encounter Medications as of 01/12/2021  Medication Sig   lanthanum (FOSRENOL) 1000 MG chewable tablet Chew 1 tablet (1,000 mg total) by mouth 3 (three) times daily with meals.   multivitamin (RENA-VIT) TABS tablet Take 1 tablet by mouth at bedtime.   SENSIPAR 30 MG tablet Take 60 mg by mouth daily.   sevelamer carbonate (RENVELA) 800 MG tablet Take 1,600 mg by  mouth 3 (three) times daily.   sildenafil (VIAGRA) 100 MG tablet Take 0.5-1 tablets (50-100 mg total) by mouth daily as needed for erectile dysfunction.   vitamin B-12 1000 MCG tablet Take 1 tablet (1,000 mcg total) by mouth daily.   No facility-administered encounter medications on file as of 01/12/2021.    Follow-up: No follow-ups on file.   Becky Sax, MD

## 2021-01-15 ENCOUNTER — Other Ambulatory Visit: Payer: Self-pay | Admitting: *Deleted

## 2021-01-15 ENCOUNTER — Other Ambulatory Visit: Payer: Self-pay

## 2021-01-15 DIAGNOSIS — N170 Acute kidney failure with tubular necrosis: Secondary | ICD-10-CM

## 2021-01-18 NOTE — Progress Notes (Deleted)
Office Note     CC:  ESRD Requesting Provider:  [name], MD  HPI: Micheal Bean is a {Handed:22697} handed 35 y.o. ([birth year]) male with kidney disease who presents at the request of [name], MD for permanent HD access.  He has a history of right nephrectomy as an infant.  In August of this year, the patient presented to the hospital with new onset acute renal failure, hydronephrosis, anemia, leukocytosis.  His renal function did not improve with IV hydration and Foley insertion, and was unfortunately started on dialysis.  He has had *** prior access procedures.Per pt, previous tunneled lines have been placed in ***. Current access is ***. Dialysis days are TTS.  On exam, ***  The pt is not on a statin for cholesterol management.  The pt is not on a daily aspirin.   Other AC:   The pt is not on medications for hypertension.   The pt is not diabetic. Tobacco hx:  ***  Past Medical History:  Diagnosis Date   Renal disorder    only has right kidney    Past Surgical History:  Procedure Laterality Date   IR FLUORO GUIDE CV LINE RIGHT  11/18/2020   IR US GUIDE VASC ACCESS RIGHT  11/18/2020   NEPHRECTOMY     childhhod, RT side    Social History   Socioeconomic History   Marital status: Single    Spouse name: Not on file   Number of children: Not on file   Years of education: Not on file   Highest education level: Not on file  Occupational History   Not on file  Tobacco Use   Smoking status: Never   Smokeless tobacco: Never  Substance and Sexual Activity   Alcohol use: Not Currently    Comment: occassional   Drug use: Yes    Types: Marijuana   Sexual activity: Not on file  Other Topics Concern   Not on file  Social History Narrative   Not on file   Social Determinants of Health   Financial Resource Strain: Not on file  Food Insecurity: Not on file  Transportation Needs: Not on file  Physical Activity: Not on file  Stress: Not on file  Social  Connections: Not on file  Intimate Partner Violence: Not on file   *** Family History  Problem Relation Age of Onset   Hypertension Mother    Kidney disease Maternal Grandmother     Current Outpatient Medications  Medication Sig Dispense Refill   cyanocobalamin 1000 MCG tablet Take 1 tablet (1,000 mcg total) by mouth daily. 30 tablet 1   lanthanum (FOSRENOL) 1000 MG chewable tablet Chew 1 tablet (1,000 mg total) by mouth 3 (three) times daily with meals. 90 tablet 0   multivitamin (RENA-VIT) TABS tablet Take 1 tablet by mouth at bedtime. 30 tablet 1   SENSIPAR 30 MG tablet Take 60 mg by mouth daily.     sevelamer carbonate (RENVELA) 800 MG tablet Take 1,600 mg by mouth 3 (three) times daily.     sildenafil (VIAGRA) 100 MG tablet Take 0.5-1 tablets (50-100 mg total) by mouth daily as needed for erectile dysfunction. 10 tablet 5   No current facility-administered medications for this visit.    No Known Allergies   REVIEW OF SYSTEMS:  *** '[X]'$  denotes positive finding, '[ ]'$  denotes negative finding Cardiac  Comments:  Chest pain or chest pressure:    Shortness of breath upon exertion:    Short of breath  when lying flat:    Irregular heart rhythm:        Vascular    Pain in calf, thigh, or hip brought on by ambulation:    Pain in feet at night that wakes you up from your sleep:     Blood clot in your veins:    Leg swelling:         Pulmonary    Oxygen at home:    Productive cough:     Wheezing:         Neurologic    Sudden weakness in arms or legs:     Sudden numbness in arms or legs:     Sudden onset of difficulty speaking or slurred speech:    Temporary loss of vision in one eye:     Problems with dizziness:         Gastrointestinal    Blood in stool:     Vomited blood:         Genitourinary    Burning when urinating:     Blood in urine:        Psychiatric    Major depression:         Hematologic    Bleeding problems:    Problems with blood clotting too  easily:        Skin    Rashes or ulcers:        Constitutional    Fever or chills:      PHYSICAL EXAMINATION:  There were no vitals filed for this visit.  General:  WDWN in NAD; vital signs documented above Gait: Not observed HENT: WNL, normocephalic Pulmonary: normal non-labored breathing , without Rales, rhonchi,  wheezing Cardiac: {Desc; regular/irreg:14544} HR, without  Murmurs {With/Without:20273} carotid bruit*** Abdomen: soft, NT, no masses Skin: {With/Without:20273} rashes Vascular Exam/Pulses:  Right Left  Radial {Exam; arterial pulse strength 0-4:30167} {Exam; arterial pulse strength 0-4:30167}  Ulnar {Exam; arterial pulse strength 0-4:30167} {Exam; arterial pulse strength 0-4:30167}  Femoral {Exam; arterial pulse strength 0-4:30167} {Exam; arterial pulse strength 0-4:30167}  Popliteal {Exam; arterial pulse strength 0-4:30167} {Exam; arterial pulse strength 0-4:30167}  DP {Exam; arterial pulse strength 0-4:30167} {Exam; arterial pulse strength 0-4:30167}  PT {Exam; arterial pulse strength 0-4:30167} {Exam; arterial pulse strength 0-4:30167}   Extremities: {With/Without:20273} ischemic changes, {With/Without:20273} Gangrene , {With/Without:20273} cellulitis; {With/Without:20273} open wounds;  Musculoskeletal: no muscle wasting or atrophy  Neurologic: A&O X 3;  No focal weakness or paresthesias are detected Psychiatric:  The pt has {Desc; normal/abnormal:11317::"Normal"} affect.   Non-Invasive Vascular Imaging:   ***    ASSESSMENT/PLAN:  Micheal Bean is a 34 y.o. male who presents with end stage renal disease  Based on vein mapping and examination ***. I had an extensive discussion with this patient in regards to the nature of access surgery, including risk, benefits, and alternatives.   The patient is aware that the risks of access surgery include but are not limited to: bleeding, infection, steal syndrome, nerve damage, ischemic monomelic neuropathy, failure  of access to mature, complications related to venous hypertension, and possible need for additional access procedures in the future. *** I discussed with the patient the nature of the staged access procedure, specifically the need for a second operation to transpose the first stage fistula if it matures adequately.   The patient has *** agreed to proceed with the above procedure which will be scheduled ***.  [name], MD [facility] [phone]

## 2021-01-19 ENCOUNTER — Encounter: Payer: Medicaid Other | Admitting: Vascular Surgery

## 2021-01-19 ENCOUNTER — Other Ambulatory Visit (HOSPITAL_COMMUNITY): Payer: Medicaid Other

## 2021-01-19 ENCOUNTER — Encounter (HOSPITAL_COMMUNITY): Payer: Medicaid Other

## 2021-02-17 NOTE — Progress Notes (Signed)
Office Note     CC:  ESRD Requesting Provider:  [name], MD  HPI: Micheal Bean is a Right handed 35 y.o. (1985/05/22) male with kidney disease who presents at the request of [name], MD for permanent HD access.   The patient's kidney disease is likely progression partly from obstruction in the setting of a solitary kidney and renal function without significant recovery after Foley placement.  The patient has had no prior access procedures. Per pt, previous tunneled lines have been placed in right internal jugular vein. Current access is right IJ. Dialysis days are T,TH,S.   On exam, Bradey was doing well with no complaints.  He is currently unemployed due to the time requirement associated with dialysis.  He is most frustrated by his recent weight loss due to the chronic disease state.  The pt is not on a statin for cholesterol management.  The pt is not on a daily aspirin.   Other AC:  - The pt is not on medications for hypertension.   The pt is not diabetic. Tobacco hx:  -  Past Medical History:  Diagnosis Date   Renal disorder    only has right kidney    Past Surgical History:  Procedure Laterality Date   IR FLUORO GUIDE CV LINE RIGHT  11/18/2020   IR US GUIDE VASC ACCESS RIGHT  11/18/2020   NEPHRECTOMY     childhhod, RT side    Social History   Socioeconomic History   Marital status: Single    Spouse name: Not on file   Number of children: Not on file   Years of education: Not on file   Highest education level: Not on file  Occupational History   Not on file  Tobacco Use   Smoking status: Never   Smokeless tobacco: Never  Substance and Sexual Activity   Alcohol use: Not Currently    Comment: occassional   Drug use: Yes    Types: Marijuana   Sexual activity: Not on file  Other Topics Concern   Not on file  Social History Narrative   Not on file   Social Determinants of Health   Financial Resource Strain: Not on file  Food Insecurity: Not  on file  Transportation Needs: Not on file  Physical Activity: Not on file  Stress: Not on file  Social Connections: Not on file  Intimate Partner Violence: Not on file    Family History  Problem Relation Age of Onset   Hypertension Mother    Kidney disease Maternal Grandmother     Current Outpatient Medications  Medication Sig Dispense Refill   cyanocobalamin 1000 MCG tablet Take 1 tablet (1,000 mcg total) by mouth daily. 30 tablet 1   lanthanum (FOSRENOL) 1000 MG chewable tablet Chew 1 tablet (1,000 mg total) by mouth 3 (three) times daily with meals. 90 tablet 0   multivitamin (RENA-VIT) TABS tablet Take 1 tablet by mouth at bedtime. 30 tablet 1   SENSIPAR 30 MG tablet Take 60 mg by mouth daily.     sevelamer carbonate (RENVELA) 800 MG tablet Take 1,600 mg by mouth 3 (three) times daily.     sildenafil (VIAGRA) 100 MG tablet Take 0.5-1 tablets (50-100 mg total) by mouth daily as needed for erectile dysfunction. 10 tablet 5   No current facility-administered medications for this visit.    No Known Allergies   REVIEW OF SYSTEMS:   [X]  denotes positive finding, [ ]  denotes negative finding Cardiac  Comments:  Chest pain or chest pressure:    Shortness of breath upon exertion:    Short of breath when lying flat:    Irregular heart rhythm:        Vascular    Pain in calf, thigh, or hip brought on by ambulation:    Pain in feet at night that wakes you up from your sleep:     Blood clot in your veins:    Leg swelling:         Pulmonary    Oxygen at home:    Productive cough:     Wheezing:         Neurologic    Sudden weakness in arms or legs:     Sudden numbness in arms or legs:     Sudden onset of difficulty speaking or slurred speech:    Temporary loss of vision in one eye:     Problems with dizziness:         Gastrointestinal    Blood in stool:     Vomited blood:         Genitourinary    Burning when urinating:     Blood in urine:        Psychiatric     Major depression:         Hematologic    Bleeding problems:    Problems with blood clotting too easily:        Skin    Rashes or ulcers:        Constitutional    Fever or chills:      PHYSICAL EXAMINATION:  There were no vitals filed for this visit.  General:  WDWN in NAD; vital signs documented above Gait: Not observed HENT: WNL, normocephalic Pulmonary: normal non-labored breathing , without Rales, rhonchi,  wheezing Cardiac: regular HR, Abdomen: soft, NT, no masses Skin: without rashes Vascular Exam/Pulses:  Right Left  Radial 2+ (normal) 2+ (normal)  Ulnar 2+ (normal) 2+ (normal)                   Extremities: without ischemic changes, without Gangrene , without cellulitis; without open wounds;  Musculoskeletal: no muscle wasting or atrophy  Neurologic: A&O X 3;  No focal weakness or paresthesias are detected Psychiatric:  The pt has Normal affect.   Non-Invasive Vascular Imaging:    +-----------------+-------------+----------+--------+  Right Cephalic   Diameter (cm)Depth (cm)Findings  +-----------------+-------------+----------+--------+  Shoulder             0.56                         +-----------------+-------------+----------+--------+  Prox upper arm       0.53                         +-----------------+-------------+----------+--------+  Mid upper arm        0.41                         +-----------------+-------------+----------+--------+  Dist upper arm       0.51                         +-----------------+-------------+----------+--------+  Antecubital fossa    0.60                         +-----------------+-------------+----------+--------+  Prox forearm  0.48                         +-----------------+-------------+----------+--------+  Mid forearm          0.35                         +-----------------+-------------+----------+--------+  Dist forearm         0.32                          +-----------------+-------------+----------+--------+   +-----------------+-------------+----------+--------+  Right Basilic    Diameter (cm)Depth (cm)Findings  +-----------------+-------------+----------+--------+  Prox upper arm       0.60                         +-----------------+-------------+----------+--------+  Mid upper arm        0.54                         +-----------------+-------------+----------+--------+  Dist upper arm       0.52                         +-----------------+-------------+----------+--------+  Antecubital fossa    0.69                         +-----------------+-------------+----------+--------+  Prox forearm         0.62                         +-----------------+-------------+----------+--------+   +-----------------+-------------+----------+--------+  Left Cephalic    Diameter (cm)Depth (cm)Findings  +-----------------+-------------+----------+--------+  Shoulder             0.25                         +-----------------+-------------+----------+--------+  Prox upper arm       0.25                         +-----------------+-------------+----------+--------+  Mid upper arm        0.22                         +-----------------+-------------+----------+--------+  Dist upper arm       0.23                         +-----------------+-------------+----------+--------+  Antecubital fossa    0.28                         +-----------------+-------------+----------+--------+  Prox forearm         0.32                         +-----------------+-------------+----------+--------+  Mid forearm          0.32                         +-----------------+-------------+----------+--------+  Dist forearm         0.30                         +-----------------+-------------+----------+--------+   +-----------------+-------------+----------+--------+  Left Basilic     Diameter  (cm)Depth (cm)Findings  +-----------------+-------------+----------+--------+  Prox upper arm       0.48                         +-----------------+-------------+----------+--------+  Mid upper arm        0.60                         +-----------------+-------------+----------+--------+  Dist upper arm       0.56                         +-----------------+-------------+----------+--------+  Antecubital fossa    0.50                         +-----------------+-------------+----------+--------+  Prox forearm         0.42                         +-----------------+-------------+----------+--------+     ASSESSMENT/PLAN:  JAHSIAH CARPENTER is a 35 y.o. male who presents with end stage renal disease  Based on vein mapping and examination, he has sufficiently sized veins in his right and left arms.  Notably, the distal cephalic vein is greater than 3 mm in size in the right arm.  I placed an ultrasound on the vein while in clinic, and I do have concerns because it appears smaller at a branch point. I discussed with Leonce, that on the day of surgery I would insonate the distal cephalic vein for hopeful radiocephalic fistula, however if this appeared small I would move forward with right brachiocephalic fistula. The left cephalic vein is smaller, and would have a higher chance of not maturing. I had an extensive discussion with this patient in regards to the nature of access surgery, including risk, benefits, and alternatives.   The patient is aware that the risks of access surgery include but are not limited to: bleeding, infection, steal syndrome, nerve damage, ischemic monomelic neuropathy, failure of access to mature, complications related to venous hypertension, and possible need for additional access procedures in the future. The patient has agreed to proceed with Right arm AV fistula creation which will be scheduled in the coming weeks.  [name], MD [facility] [phone]

## 2021-02-20 ENCOUNTER — Encounter: Payer: Self-pay | Admitting: Vascular Surgery

## 2021-02-20 ENCOUNTER — Ambulatory Visit (INDEPENDENT_AMBULATORY_CARE_PROVIDER_SITE_OTHER): Payer: Medicare Other | Admitting: Vascular Surgery

## 2021-02-20 ENCOUNTER — Ambulatory Visit (HOSPITAL_COMMUNITY)
Admission: RE | Admit: 2021-02-20 | Discharge: 2021-02-20 | Disposition: A | Discharge status: Home / Self Care | Payer: Medicare Other | Source: Ambulatory Visit | Attending: Vascular Surgery | Admitting: Vascular Surgery

## 2021-02-20 ENCOUNTER — Other Ambulatory Visit: Payer: Self-pay

## 2021-02-20 ENCOUNTER — Ambulatory Visit (INDEPENDENT_AMBULATORY_CARE_PROVIDER_SITE_OTHER)
Admission: RE | Admit: 2021-02-20 | Discharge: 2021-02-20 | Disposition: A | Discharge status: Home / Self Care | Payer: Medicare Other | Source: Ambulatory Visit | Attending: Vascular Surgery | Admitting: Vascular Surgery

## 2021-02-20 VITALS — BP 132/83 | HR 90 | Temp 97.9°F | Resp 20 | Ht 73.0 in | Wt 181.0 lb

## 2021-02-20 DIAGNOSIS — N186 End stage renal disease: Secondary | ICD-10-CM | POA: Diagnosis not present

## 2021-02-20 DIAGNOSIS — Z992 Dependence on renal dialysis: Secondary | ICD-10-CM | POA: Diagnosis not present

## 2021-02-20 DIAGNOSIS — N170 Acute kidney failure with tubular necrosis: Secondary | ICD-10-CM

## 2021-02-24 ENCOUNTER — Encounter (HOSPITAL_COMMUNITY): Payer: Self-pay | Admitting: Vascular Surgery

## 2021-02-24 NOTE — Progress Notes (Addendum)
Micheal Bean denies chest pain or shortness of breath.  Patient denies having any s/s of Covid in his household.  Patient denies any known exposure to Covid.   Micheal Bean reports that he had a seizure on dialysis on 02/19/21, patient did not know anything about it.  Micheal Bean reports that he had never had a seizure before and has not had a seizure since 02/19/21.  I will call the dialysis on [facility] and ask to have the record of seizure to be faxed to [facility].  I instructed Micheal Bean  to wash up well  with antibiotic soap, if it is available, Monday AM.  Dry off with a clean towel. Do not put lotion, powder, cologne or deodorant or makeup.No jewelry or piercings. Men may shave their face and neck. Woman should not shave. No nail polish, artificial or acrylic nails. Wear clean clothes, brush your teeth. Glasses, contact lens,dentures or partials may not be worn in the OR. If you need to wear them, please bring a case for glasses, do not wear contacts or bring a case, the hospital does not have contact cases, dentures or partials will have to be removed , make sure they are clean, we will provide a denture cup to put them in. You will need some one to drive you home and a responsible person over the age of 37 to stay with you for the first 24 hours after surgery.   I spoke to [place] at [facility] dialysis, Juliann Pulse was not aware of Micheal Bean having a seizure, she is investigating and will call me back. Juliann Pulse spoke with the nurse that had Micheal Bean as a patient on the day of the "seizure", the nurse said Micheal Bean passed out, he did not have a seizure.  Juliann Pulse said that someone probably said it looked like a seizure.  Juliann Pulse reported that Micheal Bean missing lots of treatments and when he comes in lots of fluid has to be removed, when that happens the blood pressure drops and patient can pass out.

## 2021-03-02 ENCOUNTER — Other Ambulatory Visit: Payer: Self-pay

## 2021-03-02 ENCOUNTER — Encounter (HOSPITAL_COMMUNITY)
Admission: RE | Disposition: A | Discharge status: Home / Self Care | Payer: Self-pay | Source: Home / Self Care | Attending: Vascular Surgery

## 2021-03-02 ENCOUNTER — Ambulatory Visit (HOSPITAL_COMMUNITY)
Admission: RE | Admit: 2021-03-02 | Discharge: 2021-03-02 | Disposition: A | Discharge status: Home / Self Care | Payer: Medicare Other | Attending: Vascular Surgery | Admitting: Vascular Surgery

## 2021-03-02 ENCOUNTER — Encounter (HOSPITAL_COMMUNITY): Payer: Self-pay | Admitting: Vascular Surgery

## 2021-03-02 ENCOUNTER — Ambulatory Visit (HOSPITAL_COMMUNITY): Payer: Medicare Other | Admitting: Anesthesiology

## 2021-03-02 DIAGNOSIS — I739 Peripheral vascular disease, unspecified: Secondary | ICD-10-CM | POA: Insufficient documentation

## 2021-03-02 DIAGNOSIS — N186 End stage renal disease: Secondary | ICD-10-CM | POA: Diagnosis present

## 2021-03-02 DIAGNOSIS — Z905 Acquired absence of kidney: Secondary | ICD-10-CM | POA: Diagnosis not present

## 2021-03-02 HISTORY — PX: AV FISTULA PLACEMENT: SHX1204

## 2021-03-02 HISTORY — DX: End stage renal disease: N18.6

## 2021-03-02 LAB — POCT I-STAT, CHEM 8
BUN: 75 mg/dL — ABNORMAL HIGH (ref 6–20)
Calcium, Ion: 0.99 mmol/L — ABNORMAL LOW (ref 1.15–1.40)
Chloride: 107 mmol/L (ref 98–111)
Creatinine, Ser: 15.8 mg/dL — ABNORMAL HIGH (ref 0.61–1.24)
Glucose, Bld: 81 mg/dL (ref 70–99)
HCT: 29 % — ABNORMAL LOW (ref 39.0–52.0)
Hemoglobin: 9.9 g/dL — ABNORMAL LOW (ref 13.0–17.0)
Potassium: 4.6 mmol/L (ref 3.5–5.1)
Sodium: 140 mmol/L (ref 135–145)
TCO2: 21 mmol/L — ABNORMAL LOW (ref 22–32)

## 2021-03-02 SURGERY — ARTERIOVENOUS (AV) FISTULA CREATION
Anesthesia: Regional | Site: Arm Lower | Laterality: Right

## 2021-03-02 MED ORDER — HYDROCODONE-ACETAMINOPHEN 5-325 MG PO TABS: 1.0000 | ORAL_TABLET | Freq: Four times a day (QID) | ORAL | 0 refills | Status: DC | PRN

## 2021-03-02 MED ORDER — PROPOFOL 1000 MG/100ML IV EMUL
INTRAVENOUS | Status: AC
  Filled 2021-03-02: qty 100

## 2021-03-02 MED ORDER — PROMETHAZINE HCL 25 MG/ML IJ SOLN: 6.2500 mg | INTRAMUSCULAR | Status: DC | PRN

## 2021-03-02 MED ORDER — KETAMINE HCL 10 MG/ML IJ SOLN
INTRAMUSCULAR | Status: DC | PRN
  Administered 2021-03-02: 20 mg via INTRAVENOUS

## 2021-03-02 MED ORDER — OXYCODONE HCL 5 MG PO TABS: 5.0000 mg | ORAL_TABLET | Freq: Once | ORAL | Status: DC | PRN

## 2021-03-02 MED ORDER — PAPAVERINE HCL 30 MG/ML IJ SOLN
INTRAMUSCULAR | Status: DC | PRN
  Administered 2021-03-02: 60 mg via INTRAVENOUS

## 2021-03-02 MED ORDER — HEPARIN SODIUM (PORCINE) 1000 UNIT/ML IJ SOLN
INTRAMUSCULAR | Status: DC | PRN
  Administered 2021-03-02: 3000 [IU] via INTRAVENOUS
  Administered 2021-03-02: 2000 [IU] via INTRAVENOUS

## 2021-03-02 MED ORDER — FENTANYL CITRATE (PF) 250 MCG/5ML IJ SOLN
INTRAMUSCULAR | Status: AC
  Filled 2021-03-02: qty 5

## 2021-03-02 MED ORDER — KETAMINE HCL 50 MG/5ML IJ SOSY
PREFILLED_SYRINGE | INTRAMUSCULAR | Status: AC
  Filled 2021-03-02: qty 5

## 2021-03-02 MED ORDER — 0.9 % SODIUM CHLORIDE (POUR BTL) OPTIME
TOPICAL | Status: DC | PRN
  Administered 2021-03-02: 09:00:00 1000 mL

## 2021-03-02 MED ORDER — HEMOSTATIC AGENTS (NO CHARGE) OPTIME
TOPICAL | Status: DC | PRN
  Administered 2021-03-02: 1 via TOPICAL

## 2021-03-02 MED ORDER — MIDAZOLAM HCL 2 MG/2ML IJ SOLN
INTRAMUSCULAR | Status: AC
  Filled 2021-03-02: qty 4

## 2021-03-02 MED ORDER — FENTANYL CITRATE (PF) 100 MCG/2ML IJ SOLN: 25.0000 ug | INTRAMUSCULAR | Status: DC | PRN

## 2021-03-02 MED ORDER — CEFAZOLIN SODIUM-DEXTROSE 2-4 GM/100ML-% IV SOLN
INTRAVENOUS | Status: AC
  Filled 2021-03-02: qty 100

## 2021-03-02 MED ORDER — OXYCODONE HCL 5 MG/5ML PO SOLN: 5.0000 mg | Freq: Once | ORAL | Status: DC | PRN

## 2021-03-02 MED ORDER — LIDOCAINE HCL (PF) 2 % IJ SOLN
INTRAMUSCULAR | Status: DC | PRN
  Administered 2021-03-02: 60 mg via INTRADERMAL

## 2021-03-02 MED ORDER — SODIUM CHLORIDE 0.9 % IV SOLN: INTRAVENOUS | Status: DC | PRN

## 2021-03-02 MED ORDER — CEFAZOLIN SODIUM-DEXTROSE 2-4 GM/100ML-% IV SOLN: 2.0000 g | INTRAVENOUS | Status: DC

## 2021-03-02 MED ORDER — CHLORHEXIDINE GLUCONATE 0.12 % MT SOLN
OROMUCOSAL | Status: AC
  Administered 2021-03-02: 08:00:00 15 mL via OROMUCOSAL
  Filled 2021-03-02: qty 15

## 2021-03-02 MED ORDER — MIDAZOLAM HCL 5 MG/5ML IJ SOLN
INTRAMUSCULAR | Status: DC | PRN
  Administered 2021-03-02 (×4): 1 mg via INTRAVENOUS

## 2021-03-02 MED ORDER — CHLORHEXIDINE GLUCONATE 4 % EX LIQD: 60.0000 mL | Freq: Once | CUTANEOUS | Status: DC

## 2021-03-02 MED ORDER — ORAL CARE MOUTH RINSE: 15.0000 mL | Freq: Once | OROMUCOSAL | Status: AC

## 2021-03-02 MED ORDER — HEPARIN 6000 UNIT IRRIGATION SOLUTION
Status: AC
  Filled 2021-03-02: qty 500

## 2021-03-02 MED ORDER — SODIUM CHLORIDE 0.9 % IV SOLN: INTRAVENOUS | Status: DC

## 2021-03-02 MED ORDER — CHLORHEXIDINE GLUCONATE 0.12 % MT SOLN: 15.0000 mL | Freq: Once | OROMUCOSAL | Status: AC

## 2021-03-02 MED ORDER — MEPIVACAINE HCL (PF) 2 % IJ SOLN
INTRAMUSCULAR | Status: DC | PRN
  Administered 2021-03-02: 16 mL

## 2021-03-02 MED ORDER — PROPOFOL 10 MG/ML IV BOLUS
INTRAVENOUS | Status: DC | PRN
  Administered 2021-03-02: 75 ug/kg/min via INTRAVENOUS

## 2021-03-02 MED ORDER — PAPAVERINE HCL 30 MG/ML IJ SOLN
INTRAMUSCULAR | Status: AC
  Filled 2021-03-02: qty 2

## 2021-03-02 MED ORDER — LIDOCAINE HCL (PF) 1 % IJ SOLN
INTRAMUSCULAR | Status: AC
  Filled 2021-03-02: qty 30

## 2021-03-02 MED ORDER — FENTANYL CITRATE (PF) 100 MCG/2ML IJ SOLN
INTRAMUSCULAR | Status: DC | PRN
  Administered 2021-03-02: 25 ug via INTRAVENOUS

## 2021-03-02 MED ORDER — CEFAZOLIN SODIUM-DEXTROSE 2-3 GM-%(50ML) IV SOLR
INTRAVENOUS | Status: DC | PRN
  Administered 2021-03-02: 2 g via INTRAVENOUS

## 2021-03-02 MED ORDER — HEPARIN SODIUM (PORCINE) 1000 UNIT/ML IJ SOLN
INTRAMUSCULAR | Status: AC
  Filled 2021-03-02: qty 10

## 2021-03-02 MED ORDER — HEPARIN 6000 UNIT IRRIGATION SOLUTION
Status: DC | PRN
  Administered 2021-03-02: 1

## 2021-03-02 MED ORDER — LIDOCAINE 2% (20 MG/ML) 5 ML SYRINGE
INTRAMUSCULAR | Status: AC
  Filled 2021-03-02: qty 5

## 2021-03-02 SURGICAL SUPPLY — 46 items
ADH SKN CLS APL DERMABOND .7 (GAUZE/BANDAGES/DRESSINGS) ×1
AGENT HMST SPONGE THK3/8 (HEMOSTASIS)
ARMBAND PINK RESTRICT EXTREMIT (MISCELLANEOUS) ×2 IMPLANT
BAG COUNTER SPONGE SURGICOUNT (BAG) ×2 IMPLANT
BAG SPNG CNTER NS LX DISP (BAG) ×1
BLADE CLIPPER SURG (BLADE) ×2 IMPLANT
BNDG ELASTIC 6X5.8 VLCR STR LF (GAUZE/BANDAGES/DRESSINGS) ×1 IMPLANT
CANISTER SUCT 3000ML PPV (MISCELLANEOUS) ×2 IMPLANT
CLIP TI WIDE RED SMALL 6 (CLIP) ×1 IMPLANT
CLIP VESOCCLUDE MED 6/CT (CLIP) ×2 IMPLANT
CLIP VESOCCLUDE SM WIDE 6/CT (CLIP) ×2 IMPLANT
COVER PROBE W GEL 5X96 (DRAPES) ×2 IMPLANT
DECANTER SPIKE VIAL GLASS SM (MISCELLANEOUS) ×2 IMPLANT
DERMABOND ADVANCED (GAUZE/BANDAGES/DRESSINGS) ×1
DERMABOND ADVANCED .7 DNX12 (GAUZE/BANDAGES/DRESSINGS) ×1 IMPLANT
ELECT REM PT RETURN 9FT ADLT (ELECTROSURGICAL) ×2
ELECTRODE REM PT RTRN 9FT ADLT (ELECTROSURGICAL) ×1 IMPLANT
GAUZE 4X4 16PLY ~~LOC~~+RFID DBL (SPONGE) ×1 IMPLANT
GAUZE SPONGE 4X4 12PLY STRL (GAUZE/BANDAGES/DRESSINGS) ×1 IMPLANT
GLOVE SRG 8 PF TXTR STRL LF DI (GLOVE) ×2 IMPLANT
GLOVE SURG POLYISO LF SZ8 (GLOVE) IMPLANT
GLOVE SURG UNDER POLY LF SZ8 (GLOVE) ×4
GOWN STRL REUS W/ TWL LRG LVL3 (GOWN DISPOSABLE) ×2 IMPLANT
GOWN STRL REUS W/TWL 2XL LVL3 (GOWN DISPOSABLE) ×2 IMPLANT
GOWN STRL REUS W/TWL LRG LVL3 (GOWN DISPOSABLE) ×4
HEMOSTAT SNOW SURGICEL 2X4 (HEMOSTASIS) ×1 IMPLANT
HEMOSTAT SPONGE AVITENE ULTRA (HEMOSTASIS) IMPLANT
KIT BASIN OR (CUSTOM PROCEDURE TRAY) ×2 IMPLANT
KIT TURNOVER KIT B (KITS) ×2 IMPLANT
MARKER SKIN DUAL TIP RULER LAB (MISCELLANEOUS) ×1 IMPLANT
NDL 18GX1X1/2 (RX/OR ONLY) (NEEDLE) IMPLANT
NEEDLE 18GX1X1/2 (RX/OR ONLY) (NEEDLE) ×2 IMPLANT
NS IRRIG 1000ML POUR BTL (IV SOLUTION) ×2 IMPLANT
PACK CV ACCESS (CUSTOM PROCEDURE TRAY) ×2 IMPLANT
PAD ARMBOARD 7.5X6 YLW CONV (MISCELLANEOUS) ×4 IMPLANT
SPONGE T-LAP 18X18 ~~LOC~~+RFID (SPONGE) ×1 IMPLANT
SUT MNCRL AB 4-0 PS2 18 (SUTURE) ×2 IMPLANT
SUT PROLENE 6 0 BV (SUTURE) ×3 IMPLANT
SUT PROLENE 7 0 BV 1 (SUTURE) IMPLANT
SUT SILK 2 0 SH (SUTURE) ×2 IMPLANT
SUT SILK 3 0 SH CR/8 (SUTURE) ×2 IMPLANT
SUT VIC AB 3-0 SH 27 (SUTURE) ×4
SUT VIC AB 3-0 SH 27X BRD (SUTURE) ×1 IMPLANT
TOWEL GREEN STERILE (TOWEL DISPOSABLE) ×2 IMPLANT
UNDERPAD 30X36 HEAVY ABSORB (UNDERPADS AND DIAPERS) ×2 IMPLANT
WATER STERILE IRR 1000ML POUR (IV SOLUTION) ×2 IMPLANT

## 2021-03-02 NOTE — Anesthesia Postprocedure Evaluation (Signed)
Anesthesia Post Note  Patient: Micheal Bean  Procedure(s) Performed: RIGHT ARM Radio-Cephalic  ARTERIOVENOUS (AV) FISTULA CREATION (Right: Arm Lower)     Patient location during evaluation: PACU Anesthesia Type: Regional Level of consciousness: awake and alert Pain management: pain level controlled Vital Signs Assessment: post-procedure vital signs reviewed and stable Respiratory status: spontaneous breathing, nonlabored ventilation and respiratory function stable Cardiovascular status: stable and blood pressure returned to baseline Anesthetic complications: no   No notable events documented.  Last Vitals:  Vitals:   03/02/21 1000 03/02/21 1007  BP: (!) 143/100 (!) 144/99  Pulse: 71 70  Resp: 11 13  Temp:  36.4 C  SpO2: 100% 100%    Last Pain:  Vitals:   03/02/21 1007  TempSrc:   PainSc: 0-No pain                 Audry Pili

## 2021-03-02 NOTE — Discharge Instructions (Addendum)
° °  [facility] ° °Discharge Instructions ° °AV Fistula or Graft Surgery for Dialysis Access ° °Please refer to the following instructions for your post-procedure care. Your surgeon or physician assistant will discuss any changes with you. ° °Activity ° °You may drive the day following your surgery, if you are comfortable and no longer taking prescription pain medication. Resume full activity as the soreness in your incision resolves. ° °Bathing/Showering ° °You may shower after you go home. Keep your incision dry for 48 hours. Do not soak in a bathtub, hot tub, or swim until the incision heals completely. You may not shower if you have a hemodialysis catheter. ° °Incision Care ° °Clean your incision with mild soap and water after 48 hours. Pat the area dry with a clean towel. You do not need a bandage unless otherwise instructed. Do not apply any ointments or creams to your incision. You may have skin glue on your incision. Do not peel it off. It will come off on its own in about one week. Your arm may swell a bit after surgery. To reduce swelling use pillows to elevate your arm so it is above your heart. Your doctor will tell you if you need to lightly wrap your arm with an ACE bandage. ° °Diet ° °Resume your normal diet. There are not special food restrictions following this procedure. In order to heal from your surgery, it is CRITICAL to get adequate nutrition. Your body requires vitamins, minerals, and protein. Vegetables are the best source of vitamins and minerals. Vegetables also provide the perfect balance of protein. Processed food has little nutritional value, so try to avoid this. ° °Medications ° °Resume taking all of your medications. If your incision is causing pain, you may take over-the counter pain relievers such as acetaminophen (Tylenol). If you were prescribed a stronger pain medication, please be aware these medications can cause nausea and constipation. Prevent  nausea by taking the medication with a snack or meal. Avoid constipation by drinking plenty of fluids and eating foods with high amount of fiber, such as fruits, vegetables, and grains. Do not take Tylenol if you are taking prescription pain medications. ° ° ° ° °Follow up °Your surgeon may want to see you in the office following your access surgery. If so, this will be arranged at the time of your surgery. ° °Please call us immediately for any of the following conditions: ° °Increased pain, redness, drainage (pus) from your incision site °Fever of 101 degrees or higher °Severe or worsening pain at your incision site °Hand pain or numbness. ° °Reduce your risk of vascular disease: ° °Stop smoking. If you would like help, call [facility] at 1-800-QUIT-NOW (1-800-784-8669) or [facility] at 336-586-4000 ° °Manage your cholesterol °Maintain a desired weight °Control your diabetes °Keep your blood pressure down ° °Dialysis ° °It will take several weeks to several months for your new dialysis access to be ready for use. Your surgeon will determine when it is OK to use it. Your nephrologist will continue to direct your dialysis. You can continue to use your Permcath until your new access is ready for use. ° °If you have any questions, please call the office at [phone]. ° °

## 2021-03-02 NOTE — Transfer of Care (Signed)
Immediate Anesthesia Transfer of Care Note  Patient: Micheal Bean  Procedure(s) Performed: RIGHT ARM Radio-Cephalic  ARTERIOVENOUS (AV) FISTULA CREATION (Right: Arm Lower)  Patient Location: PACU  Anesthesia Type:MAC  Level of Consciousness: awake, alert  and oriented  Airway & Oxygen Therapy: Patient Spontanous Breathing  Post-op Assessment: Report given to RN and Post -op Vital signs reviewed and stable  Post vital signs: stable  Last Vitals:  Vitals Value Taken Time  BP 149/119 03/02/21 0945  Temp    Pulse 89 03/02/21 0945  Resp 17 03/02/21 0945  SpO2 98 % 03/02/21 0945  Vitals shown include unvalidated device data.  Last Pain:  Vitals:   03/02/21 0735  TempSrc:   PainSc: 0-No pain         Complications: No notable events documented.

## 2021-03-02 NOTE — Op Note (Addendum)
    NAME: Micheal Bean    MRN: [id number] DOB: [birth year]    DATE OF OPERATION: 03/02/2021  PREOP DIAGNOSIS:    End-stage renal disease  POSTOP DIAGNOSIS:    Same  PROCEDURE:    Right radiocephalic fistula  SURGEON: [name]  ASSIST: None  ANESTHESIA: Moderate, block  EBL: 10 mL  INDICATIONS:   Micheal Bean is a 35 y.o. male with history of end-stage renal disease in need of long-term HD access.  After discussing the risks and benefits of right radiocephalic fistula, Micheal Bean elected to proceed.  FINDINGS:   20mm cephalic vein  0.5XG radial artery  TECHNIQUE:   A right upper extremity block was performed by the anesthesia team. The patient was brought to the operating room and placed in supine position. The right arm was prepped and draped in standard fashion. IV antibiotics were administered. A timeout was performed.   The case began with ultrasound insonation of the radial artery and cephalic vein, which demonstrated sufficient size at the distal forearm for arteriovenous fistula.   A longitudinal incision was made between the radial artery and cephalic vein. The  cephalic vein was isolated for 3 cm in length. Next the forearm aponeurosis was partially released and the radial artery secured with a vessel loop. The patient was heparinized. The cephalic vein was transected and ligated distally with a 2-0 silk stick-tie. The vein was dilated with coronary dilators and flushed with heparin saline. The radial artery was small due to spasm, necessitating papaverine to dilate the vessel for fistula creation. Vascular clamps were placed proximally and distally on the radial artery and a 5 mm arteriotomy was created on the radial artery. This was flushed with heparin saline.  An anastomosis was created in end to side fashion on the radial artery using running 6-0 Prolene suture.  Prior to completing the anastomosis, the vessels were flushed and the suture line was tied down.  There was an excellent thrill in the cephalic vein from the anastomosis to the mid forearm. The patient had a multiphasic radial and ulnar signal. He had an excellent doppler signal in the fistula. The incision was irrigated and hemostasis achieved with cautery and suture. The deeper tissue was closed with 3-0 Vicryl and the skin closed with 4-0 Monocryl.  Dermabond was applied to the incisions. The patient was transferred to PACU in stable condition.   Given the complexity of the case a first assistant was necessary in order to expedient the procedure and safely perform the technical aspects of the operation.  Macie Burows, MD [facility]  DATE OF DICTATION:   03/02/2021

## 2021-03-02 NOTE — Anesthesia Preprocedure Evaluation (Addendum)
Anesthesia Evaluation  Patient identified by MRN, date of birth, ID band Patient awake    Reviewed: Allergy & Precautions, NPO status , Patient's Chart, lab work & pertinent test results  History of Anesthesia Complications Negative for: history of anesthetic complications  Airway Mallampati: II  TM Distance: >3 FB Neck ROM: Full    Dental  (+) Dental Advisory Given, Chipped   Pulmonary neg pulmonary ROS,    Pulmonary exam normal        Cardiovascular negative cardio ROS Normal cardiovascular exam     Neuro/Psych Seizures -,  negative psych ROS   GI/Hepatic negative GI ROS, (+)     substance abuse  marijuana use,   Endo/Other  negative endocrine ROS  Renal/GU ESRFRenal disease     Musculoskeletal negative musculoskeletal ROS (+)   Abdominal   Peds  Hematology negative hematology ROS (+)   Anesthesia Other Findings   Reproductive/Obstetrics                            Anesthesia Physical Anesthesia Plan  ASA: 4  Anesthesia Plan: Regional   Post-op Pain Management: Regional block   Induction:   PONV Risk Score and Plan: 1 and Propofol infusion and Treatment may vary due to age or medical condition  Airway Management Planned: Natural Airway and Simple Face Mask  Additional Equipment: None  Intra-op Plan:   Post-operative Plan:   Informed Consent:   Plan Discussed with: CRNA and Anesthesiologist  Anesthesia Plan Comments:         Anesthesia Quick Evaluation

## 2021-03-02 NOTE — Anesthesia Procedure Notes (Signed)
Anesthesia Regional Block: Supraclavicular block   Pre-Anesthetic Checklist: , timeout performed,  Correct Patient, Correct Site, Correct Laterality,  Correct Procedure, Correct Position, site marked,  Risks and benefits discussed,  Surgical consent,  Pre-op evaluation,  At surgeon's request and post-op pain management  Laterality: Right  Prep: chloraprep       Needles:  Injection technique: Single-shot  Needle Type: Echogenic Needle     Needle Length: 5cm  Needle Gauge: 21     Additional Needles:   Narrative:  Start time: 03/02/2021 7:45 AM End time: 03/02/2021 7:48 AM Injection made incrementally with aspirations every 5 mL.  Performed by: Personally  Anesthesiologist: Audry Pili, MD  Additional Notes: No pain on injection. No increased resistance to injection. Injection made in 5cc increments. Good needle visualization. Patient tolerated the procedure well.

## 2021-03-02 NOTE — H&P (Signed)
Office Note   Patient seen and examined in preop holding.  No complaints. No changes to medication history or physical exam since last seen in clinic. After discussing the risks and benefits of right arm AV fistula , Micheal Bean elected to proceed.   Micheal John MD   CC:  ESRD Requesting Provider:  No ref. provider found  HPI: Micheal Bean is a Right handed 35 y.o. (1985/07/04) male with kidney disease who presents at the request of No ref. provider found for permanent HD access.   The patient's kidney disease is likely progression partly from obstruction in the setting of a solitary kidney and renal function without significant recovery after Foley placement.  The patient has had no prior access procedures. Per pt, previous tunneled lines have been placed in right internal jugular vein. Current access is right IJ. Dialysis days are T,TH,S.   On exam, Micheal Bean was doing well with no complaints.  Micheal Bean is currently unemployed due to the time requirement associated with dialysis.  Micheal Bean is most frustrated by his recent weight loss due to the chronic disease state.  The pt is not on a statin for cholesterol management.  The pt is not on a daily aspirin.   Other AC:  - The pt is not on medications for hypertension.   The pt is not diabetic. Tobacco hx:  -  Past Medical History:  Diagnosis Date   ESRD (end stage renal disease) ([place] [place])    [place]   History of blood transfusion 11/2020   4 units    Past Surgical History:  Procedure Laterality Date   IR FLUORO GUIDE CV LINE RIGHT  11/18/2020   IR US GUIDE VASC ACCESS RIGHT  11/18/2020   NEPHRECTOMY     childhhod, RT side    Social History   Socioeconomic History   Marital status: Single    Spouse name: Not on file   Number of children: Not on file   Years of education: Not on file   Highest education level: Not on file  Occupational History   Not on file  Tobacco Use   Smoking status: Never   Smokeless tobacco:  Never  Vaping Use   Vaping Use: Never used  Substance and Sexual Activity   Alcohol use: Not Currently   Drug use: Yes    Types: Marijuana    Comment: last time 02/24/21   Sexual activity: Not on file  Other Topics Concern   Not on file  Social History Narrative   Not on file   Social Determinants of Health   Financial Resource Strain: Not on file  Food Insecurity: Not on file  Transportation Needs: Not on file  Physical Activity: Not on file  Stress: Not on file  Social Connections: Not on file  Intimate Partner Violence: Not on file    Family History  Problem Relation Age of Onset   Hypertension Mother    Kidney disease Maternal Grandmother     Current Facility-Administered Medications  Medication Dose Route Frequency Provider Last Rate Last Admin   heparin 6000 units / NS 500 mL irrigation    PRN Micheal John, MD   1 application at 63/87/56 [phone]    No Known Allergies   REVIEW OF SYSTEMS:   [X]  denotes positive finding, [ ]  denotes negative finding Cardiac  Comments:  Chest pain or chest pressure:    Shortness of breath upon exertion:    Short of breath when lying  flat:    Irregular heart rhythm:        Vascular    Pain in calf, thigh, or hip brought on by ambulation:    Pain in feet at night that wakes you up from your sleep:     Blood clot in your veins:    Leg swelling:         Pulmonary    Oxygen at home:    Productive cough:     Wheezing:         Neurologic    Sudden weakness in arms or legs:     Sudden numbness in arms or legs:     Sudden onset of difficulty speaking or slurred speech:    Temporary loss of vision in one eye:     Problems with dizziness:         Gastrointestinal    Blood in stool:     Vomited blood:         Genitourinary    Burning when urinating:     Blood in urine:        Psychiatric    Major depression:         Hematologic    Bleeding problems:    Problems with blood clotting too easily:        Skin     Rashes or ulcers:        Constitutional    Fever or chills:      PHYSICAL EXAMINATION:  Vitals:   03/02/21 0707  BP: (!) 150/86  Pulse: 62  Resp: 18  Temp: 97.6 F (36.4 C)  TempSrc: Oral  SpO2: 100%  Weight: 81.6 kg  Height: 6\' 4"  (1.93 m)    General:  WDWN in NAD; vital signs documented above Gait: Not observed HENT: WNL, normocephalic Pulmonary: normal non-labored breathing , without Rales, rhonchi,  wheezing Cardiac: regular HR, Abdomen: soft, NT, no masses Skin: without rashes Vascular Exam/Pulses:  Right Left  Radial 2+ (normal) 2+ (normal)  Ulnar 2+ (normal) 2+ (normal)                   Extremities: without ischemic changes, without Gangrene , without cellulitis; without open wounds;  Musculoskeletal: no muscle wasting or atrophy  Neurologic: A&O X 3;  No focal weakness or paresthesias are detected Psychiatric:  The pt has Normal affect.   Non-Invasive Vascular Imaging:    +-----------------+-------------+----------+--------+  Right Cephalic   Diameter (cm)Depth (cm)Findings  +-----------------+-------------+----------+--------+  Shoulder             0.56                         +-----------------+-------------+----------+--------+  Prox upper arm       0.53                         +-----------------+-------------+----------+--------+  Mid upper arm        0.41                         +-----------------+-------------+----------+--------+  Dist upper arm       0.51                         +-----------------+-------------+----------+--------+  Antecubital fossa    0.60                         +-----------------+-------------+----------+--------+  Prox forearm         0.48                         +-----------------+-------------+----------+--------+  Mid forearm          0.35                         +-----------------+-------------+----------+--------+  Dist forearm         0.32                          +-----------------+-------------+----------+--------+   +-----------------+-------------+----------+--------+  Right Basilic    Diameter (cm)Depth (cm)Findings  +-----------------+-------------+----------+--------+  Prox upper arm       0.60                         +-----------------+-------------+----------+--------+  Mid upper arm        0.54                         +-----------------+-------------+----------+--------+  Dist upper arm       0.52                         +-----------------+-------------+----------+--------+  Antecubital fossa    0.69                         +-----------------+-------------+----------+--------+  Prox forearm         0.62                         +-----------------+-------------+----------+--------+   +-----------------+-------------+----------+--------+  Left Cephalic    Diameter (cm)Depth (cm)Findings  +-----------------+-------------+----------+--------+  Shoulder             0.25                         +-----------------+-------------+----------+--------+  Prox upper arm       0.25                         +-----------------+-------------+----------+--------+  Mid upper arm        0.22                         +-----------------+-------------+----------+--------+  Dist upper arm       0.23                         +-----------------+-------------+----------+--------+  Antecubital fossa    0.28                         +-----------------+-------------+----------+--------+  Prox forearm         0.32                         +-----------------+-------------+----------+--------+  Mid forearm          0.32                         +-----------------+-------------+----------+--------+  Dist forearm         0.30                         +-----------------+-------------+----------+--------+   +-----------------+-------------+----------+--------+  Left Basilic     Diameter  (cm)Depth (cm)Findings  +-----------------+-------------+----------+--------+  Prox upper arm       0.48                         +-----------------+-------------+----------+--------+  Mid upper arm        0.60                         +-----------------+-------------+----------+--------+  Dist upper arm       0.56                         +-----------------+-------------+----------+--------+  Antecubital fossa    0.50                         +-----------------+-------------+----------+--------+  Prox forearm         0.42                         +-----------------+-------------+----------+--------+     ASSESSMENT/PLAN:  Micheal Bean is a 35 y.o. male who presents with end stage renal disease  Based on vein mapping and examination, Micheal Bean has sufficiently sized veins in his right and left arms.  Notably, the distal cephalic vein is greater than 3 mm in size in the right arm.  I placed an ultrasound on the vein while in clinic, and I do have concerns because it appears smaller at a branch point. I discussed with Jamel, that on the day of surgery I would insonate the distal cephalic vein for hopeful radiocephalic fistula, however if this appeared small I would move forward with right brachiocephalic fistula. The left cephalic vein is smaller, and would have a higher chance of not maturing. I had an extensive discussion with this patient in regards to the nature of access surgery, including risk, benefits, and alternatives.   The patient is aware that the risks of access surgery include but are not limited to: bleeding, infection, steal syndrome, nerve damage, ischemic monomelic neuropathy, failure of access to mature, complications related to venous hypertension, and possible need for additional access procedures in the future. The patient has agreed to proceed with Right arm AV fistula creation which will be scheduled in the coming weeks.  Micheal John, MD [facility] [phone]

## 2021-03-03 ENCOUNTER — Encounter (HOSPITAL_COMMUNITY): Payer: Self-pay | Admitting: Vascular Surgery

## 2021-04-02 ENCOUNTER — Other Ambulatory Visit: Payer: Self-pay | Admitting: *Deleted

## 2021-04-02 DIAGNOSIS — N186 End stage renal disease: Secondary | ICD-10-CM

## 2021-04-17 ENCOUNTER — Ambulatory Visit (HOSPITAL_COMMUNITY)
Admission: RE | Admit: 2021-04-17 | Discharge: 2021-04-17 | Disposition: A | Discharge status: Home / Self Care | Payer: Medicare Other | Source: Ambulatory Visit | Attending: Vascular Surgery | Admitting: Vascular Surgery

## 2021-04-17 ENCOUNTER — Other Ambulatory Visit: Payer: Self-pay

## 2021-04-17 ENCOUNTER — Ambulatory Visit (INDEPENDENT_AMBULATORY_CARE_PROVIDER_SITE_OTHER): Payer: Medicare Other | Admitting: Physician Assistant

## 2021-04-17 VITALS — BP 122/79 | HR 82 | Temp 98.2°F | Resp 20 | Ht 76.0 in | Wt 180.4 lb

## 2021-04-17 DIAGNOSIS — N186 End stage renal disease: Secondary | ICD-10-CM | POA: Diagnosis present

## 2021-04-17 DIAGNOSIS — Z992 Dependence on renal dialysis: Secondary | ICD-10-CM | POA: Insufficient documentation

## 2021-04-17 NOTE — Progress Notes (Signed)
Postoperative Access Visit   History of Present Illness   Micheal Bean is a 36 y.o. year old male who presents for postoperative follow-up for: creation of right radiocephalic AV fistula 99/37/16 by Dr. Virl Cagey. The patient's wounds are well healed.  The patient notes no steal symptoms.  He is currently dialyzing via a right IJ TDC.  Physical Examination   Vitals:   04/17/21 1039  BP: 122/79  Pulse: 82  Resp: 20  Temp: 98.2 F (36.8 C)  TempSrc: Temporal  SpO2: 99%  Weight: 180 lb 6.4 oz (81.8 kg)  Height: 6\' 4"  (1.93 m)   Body mass index is 21.96 kg/m.  right arm Incision is well healed, 2+ radial pulse, hand grip is 5/5, sensation in digits is intact, no palpable thrill, bruit cannot be auscultated    Non- invasive Vascular lab: 04/17/21 Findings:  +--------------------+----------+-----------------+---------+   AVF                  PSV (cm/s) Flow Vol (mL/min) Comments    +--------------------+----------+-----------------+---------+   Native artery inflow     77                       pulsatile   +--------------------+----------+-----------------+---------+   AVF Anastomosis          21                                   +--------------------+----------+-----------------+---------+     +------------+----------+-------------+----------+----------------------+   OUTFLOW VEIN PSV (cm/s) Diameter (cm) Depth (cm)        Describe          +------------+----------+-------------+----------+----------------------+   Prox UA                                          compressible, low flow   +------------+----------+-------------+----------+----------------------+   Mid UA                                           compressible, low flow   +------------+----------+-------------+----------+----------------------+   Dist UA                                                 occluded          +------------+----------+-------------+----------+----------------------+  Summary:   Pulsatile inflow. Minimal flow in the anastomosis. Short segment of outflow vein occlusion just past the anastomosis. Outflow vein is patent in the mid and proximal forearm with low flow.      Medical Decision Making   Micheal Bean is a 36 y.o. year old male who presents s/p creation of right radiocephalic AV fistula 96/78/93 by Dr. Virl Cagey. His incision is well healed. Patent is without signs or symptoms of steal syndrome. His duplex shows poorly functioning AV fistula with minimal flow. I was unable to feel thrill or hear audible bruit on exam. I have reviewed patients pre operative vein mapping and he has options for upper arm fistula in both arms. He is right handed but would prefer to keep his access in  his right arm. I discussed with him option for conversion to a right upper arm fistula. Risk, benefits, and alternatives to access surgery were discussed.  The patient is aware the risks include but are not limited to: bleeding, infection, steal syndrome, nerve damage, thrombosis, failure to mature, and need for additional procedures. He expresses interest in considering other options such as PD. I have encouraged him to discuss these options with his Nephrologist, Dr. Joelyn Oms. Patient is also on Renal transplant list and is undergoing educational classes at [facility]. He would like some time to think about his surgery and weather or not he wants to proceed in left or right arm. I have recommended follow up in a couple weeks with Dr. Virl Cagey to further discuss new access.  Karoline Caldwell, PA-C [facility] [place] Office: [phone]  Clinic MD: Virl Cagey

## 2021-04-21 ENCOUNTER — Ambulatory Visit: Payer: Medicaid Other | Admitting: Family Medicine

## 2021-05-01 ENCOUNTER — Encounter: Payer: Self-pay | Admitting: Vascular Surgery

## 2021-05-01 ENCOUNTER — Other Ambulatory Visit: Payer: Self-pay

## 2021-05-01 ENCOUNTER — Ambulatory Visit (INDEPENDENT_AMBULATORY_CARE_PROVIDER_SITE_OTHER): Payer: Medicare Other | Admitting: Vascular Surgery

## 2021-05-01 VITALS — BP 115/76 | HR 95 | Temp 98.7°F | Resp 20 | Ht 76.0 in | Wt 175.0 lb

## 2021-05-01 DIAGNOSIS — N186 End stage renal disease: Secondary | ICD-10-CM

## 2021-05-01 DIAGNOSIS — Z992 Dependence on renal dialysis: Secondary | ICD-10-CM

## 2021-05-02 NOTE — Progress Notes (Signed)
Postoperative Access Visit   History of Present Illness   Micheal Bean is a 36 y.o. year old male who presents for postoperative follow-up for creation of right radiocephalic AV fistula 16/96/78.  Unfortunately, Micheal Bean's right radiocephalic fistula did not mature.  He presents today to discuss options including hemodialysis versus peritoneal dialysis.  He had no complaints, only questions regarding both modalities of dialysis.  Micheal Bean is currently being dialyzed through a right-sided IJ tunneled HD line.  He is struggling with the tired feeling that he has postdialysis 3 times a week.  Physical Examination   Vitals:   05/01/21 1426  BP: 115/76  Pulse: 95  Resp: 20  Temp: 98.7 F (37.1 C)  SpO2: 96%  Weight: 175 lb (79.4 kg)  Height: 6\' 4"  (1.93 m)   Body mass index is 21.3 kg/m.  right arm Incision is well healed, 2+ radial pulse, hand grip is 5/5, sensation in digits is intact, no palpable thrill, bruit cannot be auscultated    Non- invasive Vascular lab: 04/17/21 Findings:  +--------------------+----------+-----------------+---------+   AVF                  PSV (cm/s) Flow Vol (mL/min) Comments    +--------------------+----------+-----------------+---------+   Native artery inflow     77                       pulsatile   +--------------------+----------+-----------------+---------+   AVF Anastomosis          21                                   +--------------------+----------+-----------------+---------+     +------------+----------+-------------+----------+----------------------+   OUTFLOW VEIN PSV (cm/s) Diameter (cm) Depth (cm)        Describe          +------------+----------+-------------+----------+----------------------+   Prox UA                                          compressible, low flow   +------------+----------+-------------+----------+----------------------+   Mid UA                                           compressible, low flow    +------------+----------+-------------+----------+----------------------+   Dist UA                                                 occluded          +------------+----------+-------------+----------+----------------------+  Summary:  Pulsatile inflow. Minimal flow in the anastomosis. Short segment of outflow vein occlusion just past the anastomosis. Outflow vein is patent in the mid and proximal forearm with low flow.      Medical Decision Making   Micheal Bean is a 36 y.o. year old male who presents s/p creation of right radiocephalic AV fistula 93/81/01. His incision is well healed, however the fistula failed to mature.  He was aware, the radiocephalic fistula was the least likely to mature, but excepted the risk due to his young age.  I had a long discussion  with him today regarding hemodialysis as well as peritoneal dialysis.  He feels as though he has the family support for peritoneal dialysis.   While I do not perform that operation, I told you I would be happy to set him up with my partner, Dr. Marjean Donna for PD catheter placement.  He has no history of abdominal surgery.  After discussing the risk and benefits of peritoneal dialysis catheter placement, with the understanding that peritoneal dialysis has a higher infection rate than arteriovenous fistula creation, Micheal Bean elected to proceed.  He was comfortable with me scheduling him with my partner Dr. Marjean Donna, and continuing his surgical discussion the day of surgery.   Micheal Bean [facility] [place] Office: [phone]  Clinic MD: Virl Cagey

## 2021-05-02 NOTE — H&P (View-Only) (Signed)
Postoperative Access Visit   History of Present Illness   Micheal Bean is a 36 y.o. year old male who presents for postoperative follow-up for creation of right radiocephalic AV fistula 16/94/50.  Unfortunately, Dysen's right radiocephalic fistula did not mature.  He presents today to discuss options including hemodialysis versus peritoneal dialysis.  He had no complaints, only questions regarding both modalities of dialysis.  Bruce is currently being dialyzed through a right-sided IJ tunneled HD line.  He is struggling with the tired feeling that he has postdialysis 3 times a week.  Physical Examination   Vitals:   05/01/21 1426  BP: 115/76  Pulse: 95  Resp: 20  Temp: 98.7 F (37.1 C)  SpO2: 96%  Weight: 175 lb (79.4 kg)  Height: 6\' 4"  (1.93 m)   Body mass index is 21.3 kg/m.  right arm Incision is well healed, 2+ radial pulse, hand grip is 5/5, sensation in digits is intact, no palpable thrill, bruit cannot be auscultated    Non- invasive Vascular lab: 04/17/21 Findings:  +--------------------+----------+-----------------+---------+   AVF                  PSV (cm/s) Flow Vol (mL/min) Comments    +--------------------+----------+-----------------+---------+   Native artery inflow     77                       pulsatile   +--------------------+----------+-----------------+---------+   AVF Anastomosis          21                                   +--------------------+----------+-----------------+---------+     +------------+----------+-------------+----------+----------------------+   OUTFLOW VEIN PSV (cm/s) Diameter (cm) Depth (cm)        Describe          +------------+----------+-------------+----------+----------------------+   Prox UA                                          compressible, low flow   +------------+----------+-------------+----------+----------------------+   Mid UA                                           compressible, low flow    +------------+----------+-------------+----------+----------------------+   Dist UA                                                 occluded          +------------+----------+-------------+----------+----------------------+  Summary:  Pulsatile inflow. Minimal flow in the anastomosis. Short segment of outflow vein occlusion just past the anastomosis. Outflow vein is patent in the mid and proximal forearm with low flow.      Medical Decision Making   Micheal Bean is a 36 y.o. year old male who presents s/p creation of right radiocephalic AV fistula 38/88/28. His incision is well healed, however the fistula failed to mature.  He was aware, the radiocephalic fistula was the least likely to mature, but excepted the risk due to his young age.  I had a long discussion  with him today regarding hemodialysis as well as peritoneal dialysis.  He feels as though he has the family support for peritoneal dialysis.   While I do not perform that operation, I told you I would be happy to set him up with my partner, Dr. Marjean Donna for PD catheter placement.  He has no history of abdominal surgery.  After discussing the risk and benefits of peritoneal dialysis catheter placement, with the understanding that peritoneal dialysis has a higher infection rate than arteriovenous fistula creation, Micheal Bean elected to proceed.  He was comfortable with me scheduling him with my partner Dr. Marjean Donna, and continuing his surgical discussion the day of surgery.   Micheal Bean [facility] Office: [phone]  Clinic MD: Virl Cagey

## 2021-05-15 ENCOUNTER — Other Ambulatory Visit: Payer: Self-pay

## 2021-05-18 ENCOUNTER — Telehealth: Payer: Self-pay

## 2021-05-18 NOTE — Telephone Encounter (Signed)
Unable to reach patient. Spoke with patient's mother, Keigen Caddell and informed of arrival time change for surgery appointment on 05/20/21 to 1130 AM at [facility]. She verbalized understanding.

## 2021-05-19 ENCOUNTER — Other Ambulatory Visit: Payer: Self-pay

## 2021-05-19 ENCOUNTER — Encounter (HOSPITAL_COMMUNITY): Payer: Self-pay | Admitting: Vascular Surgery

## 2021-05-19 NOTE — Progress Notes (Signed)
Micheal Bean denies chest pain or shortness of breath. Patient denies having any s/s of Covid in his household.  Patient denies any known exposure to Covid.  I instructed patient Micheal Bean to wash up well with antibiotic soap. Dry off with a clean towel. Do not put lotion, powder, cologne or deodorant or makeup.No jewelry or piercings. Men may shave their face and neck. Woman should not shave. No nail polish, artificial or acrylic nails. Wear clean clothes, brush your teeth. Glasses, contact lens,dentures or partials may not be worn in the OR. If you need to wear them, please bring a case for glasses, do not wear contacts or bring a case, the hospital does not have contact cases, dentures or partials will have to be removed , make sure they are clean, we will provide a denture cup to put them in. You will need some one to drive you home and a responsible person over the age of 58 to stay with you for the first 24 hours after surgery.

## 2021-05-20 ENCOUNTER — Ambulatory Visit (HOSPITAL_COMMUNITY): Payer: Medicare Other | Admitting: Anesthesiology

## 2021-05-20 ENCOUNTER — Other Ambulatory Visit: Payer: Self-pay

## 2021-05-20 ENCOUNTER — Encounter (HOSPITAL_COMMUNITY): Payer: Self-pay | Admitting: Vascular Surgery

## 2021-05-20 ENCOUNTER — Ambulatory Visit (HOSPITAL_BASED_OUTPATIENT_CLINIC_OR_DEPARTMENT_OTHER): Payer: Medicare Other | Admitting: Anesthesiology

## 2021-05-20 ENCOUNTER — Ambulatory Visit (HOSPITAL_COMMUNITY)
Admission: RE | Admit: 2021-05-20 | Discharge: 2021-05-20 | Disposition: A | Discharge status: Home / Self Care | Payer: Medicare Other | Source: Ambulatory Visit | Attending: Vascular Surgery | Admitting: Vascular Surgery

## 2021-05-20 ENCOUNTER — Encounter (HOSPITAL_COMMUNITY)
Admission: RE | Disposition: A | Discharge status: Home / Self Care | Payer: Self-pay | Source: Ambulatory Visit | Attending: Vascular Surgery

## 2021-05-20 DIAGNOSIS — N186 End stage renal disease: Secondary | ICD-10-CM | POA: Insufficient documentation

## 2021-05-20 DIAGNOSIS — Z992 Dependence on renal dialysis: Secondary | ICD-10-CM

## 2021-05-20 HISTORY — PX: CAPD INSERTION: SHX5233

## 2021-05-20 HISTORY — DX: Other complications of anesthesia, initial encounter: T88.59XA

## 2021-05-20 HISTORY — DX: Anemia, unspecified: D64.9

## 2021-05-20 HISTORY — DX: Unspecified convulsions: R56.9

## 2021-05-20 LAB — POCT I-STAT, CHEM 8
BUN: 47 mg/dL — ABNORMAL HIGH (ref 6–20)
Calcium, Ion: 1.16 mmol/L (ref 1.15–1.40)
Chloride: 105 mmol/L (ref 98–111)
Creatinine, Ser: 13 mg/dL — ABNORMAL HIGH (ref 0.61–1.24)
Glucose, Bld: 88 mg/dL (ref 70–99)
HCT: 39 % (ref 39.0–52.0)
Hemoglobin: 13.3 g/dL (ref 13.0–17.0)
Potassium: 4.6 mmol/L (ref 3.5–5.1)
Sodium: 139 mmol/L (ref 135–145)
TCO2: 25 mmol/L (ref 22–32)

## 2021-05-20 SURGERY — LAPAROSCOPIC INSERTION CONTINUOUS AMBULATORY PERITONEAL DIALYSIS  (CAPD) CATHETER
Anesthesia: General | Site: Abdomen

## 2021-05-20 MED ORDER — FENTANYL CITRATE (PF) 250 MCG/5ML IJ SOLN
INTRAMUSCULAR | Status: DC | PRN
  Administered 2021-05-20 (×3): 50 ug via INTRAVENOUS

## 2021-05-20 MED ORDER — FENTANYL CITRATE (PF) 100 MCG/2ML IJ SOLN
25.0000 ug | INTRAMUSCULAR | Status: DC | PRN
  Administered 2021-05-20 (×3): 50 ug via INTRAVENOUS

## 2021-05-20 MED ORDER — FENTANYL CITRATE (PF) 100 MCG/2ML IJ SOLN
INTRAMUSCULAR | Status: AC
  Filled 2021-05-20: qty 2

## 2021-05-20 MED ORDER — LABETALOL HCL 5 MG/ML IV SOLN
INTRAVENOUS | Status: AC
  Filled 2021-05-20: qty 4

## 2021-05-20 MED ORDER — CEFAZOLIN SODIUM-DEXTROSE 2-4 GM/100ML-% IV SOLN
2.0000 g | INTRAVENOUS | Status: AC
  Administered 2021-05-20: 2 g via INTRAVENOUS
  Filled 2021-05-20: qty 100

## 2021-05-20 MED ORDER — CHLORHEXIDINE GLUCONATE 4 % EX LIQD: 60.0000 mL | Freq: Once | CUTANEOUS | Status: DC

## 2021-05-20 MED ORDER — PROPOFOL 10 MG/ML IV BOLUS
INTRAVENOUS | Status: DC | PRN
  Administered 2021-05-20: 120 mg via INTRAVENOUS

## 2021-05-20 MED ORDER — STERILE WATER FOR IRRIGATION IR SOLN
Status: DC | PRN
  Administered 2021-05-20: 1000 mL

## 2021-05-20 MED ORDER — SUGAMMADEX SODIUM 200 MG/2ML IV SOLN
INTRAVENOUS | Status: DC | PRN
Start: 2021-05-20 — End: 2021-05-20
  Administered 2021-05-20: 200 mg via INTRAVENOUS

## 2021-05-20 MED ORDER — LACTATED RINGERS IV SOLN: INTRAVENOUS | Status: DC

## 2021-05-20 MED ORDER — MIDAZOLAM HCL 2 MG/2ML IJ SOLN
INTRAMUSCULAR | Status: AC
  Filled 2021-05-20: qty 2

## 2021-05-20 MED ORDER — ONDANSETRON HCL 4 MG/2ML IJ SOLN
INTRAMUSCULAR | Status: DC | PRN
  Administered 2021-05-20: 4 mg via INTRAVENOUS

## 2021-05-20 MED ORDER — FENTANYL CITRATE (PF) 250 MCG/5ML IJ SOLN
INTRAMUSCULAR | Status: AC
  Filled 2021-05-20: qty 5

## 2021-05-20 MED ORDER — PROPOFOL 10 MG/ML IV BOLUS
INTRAVENOUS | Status: AC
  Filled 2021-05-20: qty 20

## 2021-05-20 MED ORDER — ROCURONIUM BROMIDE 10 MG/ML (PF) SYRINGE
PREFILLED_SYRINGE | INTRAVENOUS | Status: DC | PRN
  Administered 2021-05-20: 40 mg via INTRAVENOUS

## 2021-05-20 MED ORDER — MIDAZOLAM HCL 5 MG/5ML IJ SOLN
INTRAMUSCULAR | Status: DC | PRN
Start: 2021-05-20 — End: 2021-05-20
  Administered 2021-05-20: 2 mg via INTRAVENOUS

## 2021-05-20 MED ORDER — OXYCODONE-ACETAMINOPHEN 5-325 MG PO TABS
ORAL_TABLET | ORAL | Status: AC
  Administered 2021-05-20: 1 via ORAL
  Filled 2021-05-20: qty 1

## 2021-05-20 MED ORDER — LABETALOL HCL 5 MG/ML IV SOLN
10.0000 mg | Freq: Once | INTRAVENOUS | Status: AC
  Administered 2021-05-20: 10 mg via INTRAVENOUS

## 2021-05-20 MED ORDER — CHLORHEXIDINE GLUCONATE 0.12 % MT SOLN
OROMUCOSAL | Status: AC
  Administered 2021-05-20: 15 mL via OROMUCOSAL
  Filled 2021-05-20: qty 15

## 2021-05-20 MED ORDER — SODIUM CHLORIDE 0.9 % IV SOLN: INTRAVENOUS | Status: DC

## 2021-05-20 MED ORDER — ACETAMINOPHEN 500 MG PO TABS
1000.0000 mg | ORAL_TABLET | Freq: Once | ORAL | Status: AC
Start: 2021-05-20 — End: 2021-05-20
  Administered 2021-05-20: 1000 mg via ORAL
  Filled 2021-05-20: qty 2

## 2021-05-20 MED ORDER — PHENYLEPHRINE 40 MCG/ML (10ML) SYRINGE FOR IV PUSH (FOR BLOOD PRESSURE SUPPORT)
PREFILLED_SYRINGE | INTRAVENOUS | Status: AC
  Filled 2021-05-20: qty 10

## 2021-05-20 MED ORDER — OXYCODONE-ACETAMINOPHEN 5-325 MG PO TABS: 1.0000 | ORAL_TABLET | Freq: Once | ORAL | Status: AC

## 2021-05-20 MED ORDER — LIDOCAINE 2% (20 MG/ML) 5 ML SYRINGE
INTRAMUSCULAR | Status: AC
  Filled 2021-05-20: qty 5

## 2021-05-20 MED ORDER — SODIUM CHLORIDE 0.9 % IR SOLN
Status: DC | PRN
  Administered 2021-05-20: 1

## 2021-05-20 MED ORDER — OXYCODONE-ACETAMINOPHEN 5-325 MG PO TABS: 1.0000 | ORAL_TABLET | Freq: Four times a day (QID) | ORAL | 0 refills | Status: DC | PRN

## 2021-05-20 MED ORDER — DEXAMETHASONE SODIUM PHOSPHATE 10 MG/ML IJ SOLN
INTRAMUSCULAR | Status: DC | PRN
Start: 2021-05-20 — End: 2021-05-20
  Administered 2021-05-20: 8 mg via INTRAVENOUS

## 2021-05-20 MED ORDER — LIDOCAINE 2% (20 MG/ML) 5 ML SYRINGE
INTRAMUSCULAR | Status: DC | PRN
  Administered 2021-05-20: 100 mg via INTRAVENOUS

## 2021-05-20 MED ORDER — ORAL CARE MOUTH RINSE: 15.0000 mL | Freq: Once | OROMUCOSAL | Status: AC

## 2021-05-20 MED ORDER — PROMETHAZINE HCL 25 MG/ML IJ SOLN: 6.2500 mg | INTRAMUSCULAR | Status: DC | PRN

## 2021-05-20 MED ORDER — ROCURONIUM BROMIDE 10 MG/ML (PF) SYRINGE
PREFILLED_SYRINGE | INTRAVENOUS | Status: AC
  Filled 2021-05-20: qty 10

## 2021-05-20 MED ORDER — HEPARIN 6000 UNIT IRRIGATION SOLUTION
Status: DC | PRN
  Administered 2021-05-20: 1

## 2021-05-20 MED ORDER — CHLORHEXIDINE GLUCONATE 0.12 % MT SOLN: 15.0000 mL | Freq: Once | OROMUCOSAL | Status: AC

## 2021-05-20 SURGICAL SUPPLY — 51 items
ADAPTER TITANIUM MEDIONICS (MISCELLANEOUS) ×3 IMPLANT
ADH SKN CLS APL DERMABOND .7 (GAUZE/BANDAGES/DRESSINGS) ×1
ADPR DLYS CATH STRL LF DISP (MISCELLANEOUS) ×1
APL PRP STRL LF DISP 70% ISPRP (MISCELLANEOUS) ×1
BAG DECANTER FOR FLEXI CONT (MISCELLANEOUS) ×3 IMPLANT
BIOPATCH RED 1 DISK 7.0 (GAUZE/BANDAGES/DRESSINGS) ×3 IMPLANT
BLADE 11 SAFETY STRL DISP (BLADE) ×3 IMPLANT
BLADE CLIPPER SURG (BLADE) IMPLANT
CATH EXTENDED DIALYSIS (CATHETERS) ×1 IMPLANT
CHLORAPREP W/TINT 26 (MISCELLANEOUS) ×3 IMPLANT
COVER SURGICAL LIGHT HANDLE (MISCELLANEOUS) ×3 IMPLANT
DECANTER SPIKE VIAL GLASS SM (MISCELLANEOUS) ×3 IMPLANT
DERMABOND ADVANCED (GAUZE/BANDAGES/DRESSINGS) ×1
DERMABOND ADVANCED .7 DNX12 (GAUZE/BANDAGES/DRESSINGS) ×2 IMPLANT
DRSG PAD ABDOMINAL 8X10 ST (GAUZE/BANDAGES/DRESSINGS) ×1 IMPLANT
ELECT REM PT RETURN 9FT ADLT (ELECTROSURGICAL) ×2
ELECTRODE REM PT RTRN 9FT ADLT (ELECTROSURGICAL) ×2 IMPLANT
GAUZE 4X4 16PLY ~~LOC~~+RFID DBL (SPONGE) ×1 IMPLANT
GAUZE SPONGE 4X4 12PLY STRL (GAUZE/BANDAGES/DRESSINGS) ×3 IMPLANT
GLOVE SURG POLYISO LF SZ8 (GLOVE) ×3 IMPLANT
GOWN STRL REUS W/ TWL LRG LVL3 (GOWN DISPOSABLE) ×6 IMPLANT
GOWN STRL REUS W/ TWL XL LVL3 (GOWN DISPOSABLE) ×2 IMPLANT
GOWN STRL REUS W/TWL LRG LVL3 (GOWN DISPOSABLE) ×6
GOWN STRL REUS W/TWL XL LVL3 (GOWN DISPOSABLE) ×2
GRASPER SUT TROCAR 14GX15 (MISCELLANEOUS) ×3 IMPLANT
IV NS 1000ML (IV SOLUTION) ×2
IV NS 1000ML BAXH (IV SOLUTION) ×2 IMPLANT
KIT BASIN OR (CUSTOM PROCEDURE TRAY) ×3 IMPLANT
KIT TURNOVER KIT B (KITS) ×3 IMPLANT
NDL INSUFFLATION 14GA 120MM (NEEDLE) ×2 IMPLANT
NEEDLE INSUFFLATION 14GA 120MM (NEEDLE) ×2 IMPLANT
NS IRRIG 1000ML POUR BTL (IV SOLUTION) ×3 IMPLANT
PAD ARMBOARD 7.5X6 YLW CONV (MISCELLANEOUS) ×6 IMPLANT
SCISSORS LAP 5X35 DISP (ENDOMECHANICALS) IMPLANT
SET CYSTO W/LG BORE CLAMP LF (SET/KITS/TRAYS/PACK) ×4 IMPLANT
SET TUBE SMOKE EVAC HIGH FLOW (TUBING) ×3 IMPLANT
SLEEVE ENDOPATH XCEL 5M (ENDOMECHANICALS) ×3 IMPLANT
SPONGE T-LAP 18X18 ~~LOC~~+RFID (SPONGE) ×1 IMPLANT
STYLET FALLER (MISCELLANEOUS) ×3 IMPLANT
SUT MNCRL AB 4-0 PS2 18 (SUTURE) ×6 IMPLANT
SUT PROLENE 0 SH 30 (SUTURE) ×6 IMPLANT
SUT VIC AB 3-0 SH 27 (SUTURE)
SUT VIC AB 3-0 SH 27X BRD (SUTURE) IMPLANT
SYR 20CC LL (SYRINGE) ×3 IMPLANT
TAPE CLOTH SURG 4X10 WHT LF (GAUZE/BANDAGES/DRESSINGS) ×3 IMPLANT
TOWEL GREEN STERILE (TOWEL DISPOSABLE) ×3 IMPLANT
TOWEL GREEN STERILE FF (TOWEL DISPOSABLE) ×3 IMPLANT
TRAY LAPAROSCOPIC MC (CUSTOM PROCEDURE TRAY) ×3 IMPLANT
TROCAR 5MMX150MM (TROCAR) ×3 IMPLANT
TROCAR XCEL NON-BLD 5MMX100MML (ENDOMECHANICALS) ×3 IMPLANT
WATER STERILE IRR 1000ML POUR (IV SOLUTION) ×3 IMPLANT

## 2021-05-20 NOTE — Anesthesia Procedure Notes (Signed)
Procedure Name: Intubation Date/Time: 05/20/2021 4:01 PM Performed by: Colin Benton, CRNA Pre-anesthesia Checklist: Patient identified, Emergency Drugs available, Suction available and Patient being monitored Patient Re-evaluated:Patient Re-evaluated prior to induction Oxygen Delivery Method: Circle system utilized Preoxygenation: Pre-oxygenation with 100% oxygen Induction Type: IV induction Ventilation: Mask ventilation without difficulty Laryngoscope Size: Miller and 2 Grade View: Grade I Tube type: Oral Tube size: 7.5 mm Number of attempts: 1 Airway Equipment and Method: Stylet Placement Confirmation: ETT inserted through vocal cords under direct vision, positive ETCO2 and breath sounds checked- equal and bilateral Secured at: 24 cm Tube secured with: Tape Dental Injury: Teeth and Oropharynx as per pre-operative assessment

## 2021-05-20 NOTE — Transfer of Care (Signed)
Immediate Anesthesia Transfer of Care Note  Patient: Micheal Bean  Procedure(s) Performed: LAPAROSCOPIC INSERTION CONTINUOUS AMBULATORY PERITONEAL DIALYSIS  CATHETER WITH POSSIBLE OMENTOPEXY (Abdomen)  Patient Location: PACU  Anesthesia Type:General  Level of Consciousness: awake and oriented  Airway & Oxygen Therapy: Patient Spontanous Breathing  Post-op Assessment: Report given to RN  Post vital signs: Reviewed and stable  Last Vitals:  Vitals Value Taken Time  BP 159/113   Temp    Pulse 96 05/20/21 1653  Resp 16 05/20/21 1653  SpO2 96 % 05/20/21 1653  Vitals shown include unvalidated device data.  Last Pain:  Vitals:   05/20/21 1324  TempSrc:   PainSc: 0-No pain         Complications: No notable events documented.

## 2021-05-20 NOTE — Anesthesia Postprocedure Evaluation (Signed)
Anesthesia Post Note  Patient: Micheal Bean  Procedure(s) Performed: LAPAROSCOPIC INSERTION CONTINUOUS AMBULATORY PERITONEAL DIALYSIS  CATHETER WITH POSSIBLE OMENTOPEXY (Abdomen)     Patient location during evaluation: PACU Anesthesia Type: General Level of consciousness: awake and alert Pain management: pain level controlled Vital Signs Assessment: post-procedure vital signs reviewed and stable Respiratory status: spontaneous breathing, nonlabored ventilation, respiratory function stable and patient connected to nasal cannula oxygen Cardiovascular status: blood pressure returned to baseline and stable Postop Assessment: no apparent nausea or vomiting Anesthetic complications: no   No notable events documented.  Last Vitals:  Vitals:   05/20/21 1655 05/20/21 1710  BP: (!) 159/113 (!) 154/106  Pulse: 97 87  Resp: 17 13  Temp: 36.7 C   SpO2: 98% 97%    Last Pain:  Vitals:   05/20/21 1655  TempSrc:   PainSc: [place]

## 2021-05-20 NOTE — Discharge Instructions (Signed)
You may use 05/21/21 follow instruction per dialysis center instructions.

## 2021-05-20 NOTE — Op Note (Signed)
DATE OF SERVICE: 05/20/2021  PATIENT:  Micheal Bean  36 y.o. male  PRE-OPERATIVE DIAGNOSIS:  ESRD  POST-OPERATIVE DIAGNOSIS:  Same  PROCEDURE:   1) laparoscopic omentopexy 2) laparoscopic peritoneal dialysis catheter placement  SURGEON:  Surgeon(s) and Role:    * Cherre Robins, MD - Primary  ASSISTANT: Gerri Lins, PA-C  An assistant was required to facilitate exposure and expedite the case.  ANESTHESIA:   general  EBL: minimal  BLOOD ADMINISTERED:none  DRAINS: none   LOCAL MEDICATIONS USED:  NONE  SPECIMEN:  none  COUNTS: confirmed correct.  TOURNIQUET:  none  PATIENT DISPOSITION:  PACU - hemodynamically stable.   Delay start of Pharmacological VTE agent (>24hrs) due to surgical blood loss or risk of bleeding: no  INDICATION FOR PROCEDURE: Micheal Bean is a 36 y.o. male with ESRD in need of permanent dialysis access. After careful discussion of risks, benefits, and alternatives the patient was offered peritoneal dialysis catheter placement. The patient understood and wished to proceed.  OPERATIVE FINDINGS: unremarkable omentopexy; unremarkable PD catheter placement.  DESCRIPTION OF PROCEDURE: After identification of the patient in the pre-operative holding area, the patient was transferred to the operating room. The patient was positioned supine on the operating room table. Anesthesia was induced. The abdomen was prepped and draped in standard fashion. A surgical pause was performed confirming correct patient, procedure, and operative location.  A Veress needle was introduced into the abdomen at Palmer's point, immediately below the left costal margin.  A saline drop test was used to confirm intra-abdominal position.  The Veress needle was connected to insufflation tubing and insufflation initiated.  A low opening pressure and good flow rate were noted.  A 5 mm trocar was introduced into the right upper quadrant using Visi-View technique.  The obturator was  removed and the abdomen inspected with a 30 degree angled laparoscope.  No evidence of Veress needle injury was noted.  An additional 5 mm trocar was inserted a handsbreadth away to facilitate the case.  The omentum was grasped with an atraumatic grasper and elevated to the upper abdomen.  A laparoscopic suture passer was used to deliver a 0 Prolene suture through the omentum.  The suture was secured down to the anterior abdominal wall with a knot.  The omentum was pexied in place.  The abdomen was desufflated.  The peritoneal catheter was measured across the abdominal wall surface.  A mark was made by the cuff in the periumbilical abdomen.  The abdomen was reinsufflated.  An advantage 5 mm trocar was then used to create a skiving path through the anterior rectus fascia, rectus muscle, and peritoneum.  The laparoscopic trocar entered in the suprapubic abdomen directing towards the pelvis.  The working end of the catheter was delivered through the trocar.  The cuff was brought into the abdomen and then placed back into the rectus muscle.  The abdomen was desufflated again.  A "swan-neck" extension tubing for the dialysis catheter was brought onto the field.  The catheter was laid across the desufflated abdomen to lay across the upper quadrant and exit the left abdomen.  The catheter was cut.  The 2 pieces of catheter were then [facility] using a Christmas tree adapter.  This was secured in place with 2 interrupted sutures of 0 Prolene.  The connected catheter was then tunneled to a counterincision in the upper quadrant and then out the lateral abdomen in a downward deflection.  Great care was taken to avoid twisting or kinking  the catheter.  The abdomen was reinsufflated.  The peritoneum was inspected to ensure the catheter did not enter the cavity.  Satisfied we desufflated the abdomen.  The catheter was connected to cystoscopy tubing.  The catheter flushed and drained without any difficulty.  The trochars were  removed.  All incisions were closed with interrupted 4-0 Monocryl sutures.  Dermabond was applied.  A sterile bandage was applied to the peritoneal dialysis catheter.   Upon completion of the case instrument and sharps counts were confirmed correct. The patient was transferred to the PACU in good condition. I was present for all portions of the procedure.  Yevonne Aline. Stanford Breed, MD [facility] Phone Number: [phone] 05/20/2021 4:40 PM

## 2021-05-20 NOTE — Interval H&P Note (Signed)
History and Physical Interval Note:  05/20/2021 3:08 PM  Micheal Bean Born  has presented today for surgery, with the diagnosis of ESRD.  The various methods of treatment have been discussed with the patient and family. After consideration of risks, benefits and other options for treatment, the patient has consented to  Procedure(s): [place]  (CAPD) CATHETER WITH POSSIBLE OMENTOPEXY (N/A) as a surgical intervention.  The patient's history has been reviewed, patient examined, no change in status, stable for surgery.  I have reviewed the patient's chart and labs.  Questions were answered to the patient's satisfaction.     Cherre Robins
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
## 2021-05-21 ENCOUNTER — Encounter (HOSPITAL_COMMUNITY): Payer: Self-pay | Admitting: Vascular Surgery

## 2021-06-06 DIAGNOSIS — E789 Disorder of lipoprotein metabolism, unspecified: Secondary | ICD-10-CM | POA: Insufficient documentation

## 2021-06-06 DIAGNOSIS — K769 Liver disease, unspecified: Secondary | ICD-10-CM | POA: Insufficient documentation

## 2021-06-07 ENCOUNTER — Other Ambulatory Visit: Payer: Self-pay | Admitting: Family Medicine

## 2021-06-21 DIAGNOSIS — N186 End stage renal disease: Secondary | ICD-10-CM | POA: Insufficient documentation

## 2021-06-23 ENCOUNTER — Other Ambulatory Visit (HOSPITAL_COMMUNITY): Payer: Self-pay | Admitting: Nephrology

## 2021-06-23 DIAGNOSIS — Z949 Transplanted organ and tissue status, unspecified: Secondary | ICD-10-CM

## 2021-06-25 ENCOUNTER — Other Ambulatory Visit (HOSPITAL_COMMUNITY): Payer: Self-pay | Admitting: Nephrology

## 2021-06-25 DIAGNOSIS — Z01810 Encounter for preprocedural cardiovascular examination: Secondary | ICD-10-CM

## 2021-07-15 ENCOUNTER — Telehealth (HOSPITAL_COMMUNITY): Payer: Self-pay | Admitting: *Deleted

## 2021-07-15 NOTE — Telephone Encounter (Signed)
Patient given detailed instructions per Stress Test Requisition Sheet for test on 07/23/2021 at 2:00.Patient Notified to arrive 30 minutes early, and that it is imperative to arrive on time for appointment to keep from having the test rescheduled.  Patient verbalized understanding. Micheal Bean ? ?  ? ?

## 2021-07-23 ENCOUNTER — Ambulatory Visit (HOSPITAL_COMMUNITY): Payer: Medicare Other | Attending: Cardiology

## 2021-07-23 ENCOUNTER — Ambulatory Visit (HOSPITAL_BASED_OUTPATIENT_CLINIC_OR_DEPARTMENT_OTHER): Payer: Medicare Other

## 2021-07-23 DIAGNOSIS — Z01810 Encounter for preprocedural cardiovascular examination: Secondary | ICD-10-CM | POA: Insufficient documentation

## 2021-07-23 LAB — ECHOCARDIOGRAM STRESS TEST
Area-P 1/2: 4.44 cm2
S' Lateral: 2.7 cm

## 2021-07-23 MED ORDER — PERFLUTREN LIPID MICROSPHERE
1.0000 mL | INTRAVENOUS | Status: AC | PRN
  Administered 2021-07-23 (×3): 2 mL via INTRAVENOUS

## 2021-10-07 ENCOUNTER — Encounter (HOSPITAL_COMMUNITY): Payer: Self-pay

## 2021-10-07 ENCOUNTER — Other Ambulatory Visit: Payer: Self-pay

## 2021-10-07 ENCOUNTER — Emergency Department (HOSPITAL_COMMUNITY)
Admission: EM | Admit: 2021-10-07 | Discharge: 2021-10-07 | Disposition: A | Discharge status: Home / Self Care | Payer: Medicare Other | Attending: Emergency Medicine | Admitting: Emergency Medicine

## 2021-10-07 ENCOUNTER — Emergency Department (HOSPITAL_COMMUNITY): Payer: Medicare Other

## 2021-10-07 DIAGNOSIS — S76111A Strain of right quadriceps muscle, fascia and tendon, initial encounter: Secondary | ICD-10-CM | POA: Insufficient documentation

## 2021-10-07 DIAGNOSIS — Y9302 Activity, running: Secondary | ICD-10-CM | POA: Diagnosis not present

## 2021-10-07 DIAGNOSIS — S8991XA Unspecified injury of right lower leg, initial encounter: Secondary | ICD-10-CM | POA: Diagnosis present

## 2021-10-07 DIAGNOSIS — S82001A Unspecified fracture of right patella, initial encounter for closed fracture: Secondary | ICD-10-CM | POA: Insufficient documentation

## 2021-10-07 DIAGNOSIS — W010XXA Fall on same level from slipping, tripping and stumbling without subsequent striking against object, initial encounter: Secondary | ICD-10-CM | POA: Insufficient documentation

## 2021-10-07 MED ORDER — OXYCODONE-ACETAMINOPHEN 5-325 MG PO TABS
2.0000 | ORAL_TABLET | Freq: Once | ORAL | Status: AC
  Administered 2021-10-07: 2 via ORAL
  Filled 2021-10-07: qty 2

## 2021-10-07 MED ORDER — OXYCODONE-ACETAMINOPHEN 5-325 MG PO TABS: 1.0000 | ORAL_TABLET | Freq: Four times a day (QID) | ORAL | 0 refills | Status: DC | PRN
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
## 2021-10-07 NOTE — Discharge Instructions (Signed)
Your x-ray and exam are concerning for quadriceps tendon rupture.  Please use the brace and crutches.  You will need to call the orthopedic doctor this morning.  Please schedule an appointment to be seen as soon as possible.  Keep your foot up and elevated and apply ice to help keep the swelling down.  You should not drive due to this affecting your right leg.  Take pain medication as prescribed.

## 2021-10-07 NOTE — ED Notes (Signed)
Ice applied to right knee.  

## 2021-10-07 NOTE — ED Triage Notes (Signed)
Patient said he was running, tripped and fell but does not remember what he fell on. Said he hurt his right knee, felt a pop. No loss of consciousness.

## 2021-10-09 ENCOUNTER — Other Ambulatory Visit: Payer: Self-pay | Admitting: Orthopedic Surgery

## 2021-10-21 NOTE — Progress Notes (Signed)
Surgical Instructions    Your procedure is scheduled on Thursday July 27.  Report to [facility] Main Entrance "A" at 5:30 A.M., then check in with the Admitting office.  Call this number if you have problems the morning of surgery:  [phone]   If you have any questions prior to your surgery date call [phone]: Open Monday-Friday 8am-4pm    Remember:  Do not eat after midnight the night before your surgery  You may drink clear liquids until 4:30am the morning of your surgery.   Clear liquids allowed are: Water, Non-Citrus Juices (without pulp), Carbonated Beverages, Clear Tea, Black Coffee ONLY (NO MILK, CREAM OR POWDERED CREAMER of any kind), and Gatorade  The day of surgery (if you do NOT have diabetes):  Drink ONE (1) Pre-Surgery Clear Ensure by 4:30 am the morning of surgery. Drink in one sitting. Do not sip.  This drink was given to you during your hospital  pre-op appointment visit.  Nothing else to drink after completing the  Pre-Surgery Clear Ensure         If you have questions, please contact your surgeon's office.      Take these medicines the morning of surgery with A SIP OF WATER:  calcitRIOL (ROCALTROL)  sevelamer carbonate (RENVELA)  oxyCODONE-acetaminophen (PERCOCET/ROXICET) if needed  As of today, STOP taking any Aspirin (unless otherwise instructed by your surgeon) Aleve, Naproxen, Ibuprofen, Motrin, Advil, Goody's, BC's, all herbal medications, fish oil, and all vitamins.           Do not wear jewelry or makeup Do not wear lotions, powders, perfumes/colognes, or deodorant. Do not shave 48 hours prior to surgery.  Men may shave face and neck. Do not bring valuables to the hospital. Do not wear nail polish, gel polish, artificial nails, or any other type of covering on natural nails (fingers and toes) If you have artificial nails or gel coating that need to be removed by a nail salon, please have this removed prior to surgery. Artificial nails or gel  coating may interfere with anesthesia's ability to adequately monitor your vital signs.  [facility] is not responsible for any belongings or valuables. .   Do NOT Smoke (Tobacco/Vaping)  24 hours prior to your procedure  If you use a CPAP at night, you may bring your mask for your overnight stay.   Contacts, glasses, hearing aids, dentures or partials may not be worn into surgery, please bring cases for these belongings   For patients admitted to the hospital, discharge time will be determined by your treatment team.   Patients discharged the day of surgery will not be allowed to drive home, and someone needs to stay with them for 24 hours.   SURGICAL WAITING ROOM VISITATION Patients having surgery or a procedure may have no more than 2 support people in the waiting area - these visitors may rotate.   Children under the age of 65 must have an adult with them who is not the patient. If the patient needs to stay at the hospital during part of their recovery, the visitor guidelines for inpatient rooms apply. Pre-op nurse will coordinate an appropriate time for 1 support person to accompany patient in pre-op.  This support person may not rotate.   Please refer to the [facility] website for the visitor guidelines for Inpatients (after your surgery is over and you are in a regular room).    Special instructions:    Oral Hygiene is also important to reduce your risk  of infection.  Remember - BRUSH YOUR TEETH THE MORNING OF SURGERY WITH YOUR REGULAR TOOTHPASTE   [facility]- Preparing For Surgery  Before surgery, you can play an important role. Because skin is not sterile, your skin needs to be as free of germs as possible. You can reduce the number of germs on your skin by washing with CHG (chlorahexidine gluconate) Soap before surgery.  CHG is an antiseptic cleaner which kills germs and bonds with the skin to continue killing germs even after washing.     Please do not use if you have an  allergy to CHG or antibacterial soaps. If your skin becomes reddened/irritated stop using the CHG.  Do not shave (including legs and underarms) for at least 48 hours prior to first CHG shower. It is OK to shave your face.  Please follow these instructions carefully.     Shower the NIGHT BEFORE SURGERY and the MORNING OF SURGERY with CHG Soap.   If you chose to wash your hair, wash your hair first as usual with your normal shampoo. After you shampoo, rinse your hair and body thoroughly to remove the shampoo.  Then [employer] and genitals (private parts) with your normal soap and rinse thoroughly to remove soap.  After that Use CHG Soap as you would any other liquid soap. You can apply CHG directly to the skin and wash gently with a scrungie or a clean washcloth.   Apply the CHG Soap to your body ONLY FROM THE NECK DOWN.  Do not use on open wounds or open sores. Avoid contact with your eyes, ears, mouth and genitals (private parts). Wash Face and genitals (private parts)  with your normal soap.   Wash thoroughly, paying special attention to the area where your surgery will be performed.  Thoroughly rinse your body with warm water from the neck down.  DO NOT shower/wash with your normal soap after using and rinsing off the CHG Soap.  Pat yourself dry with a CLEAN TOWEL.  Wear CLEAN PAJAMAS to bed the night before surgery  Place CLEAN SHEETS on your bed the night before your surgery  DO NOT SLEEP WITH PETS.   Day of Surgery:  Take a shower with CHG soap. Wear Clean/Comfortable clothing the morning of surgery Do not apply any deodorants/lotions.   Remember to brush your teeth WITH YOUR REGULAR TOOTHPASTE.    If you received a COVID test during your pre-op visit, it is requested that you wear a mask when out in public, stay away from anyone that may not be feeling well, and notify your surgeon if you develop symptoms. If you have been in contact with anyone that has tested positive in  the last 10 days, please notify your surgeon.    Please read over the following fact sheets that you were given.

## 2021-10-22 ENCOUNTER — Ambulatory Visit: Payer: Medicare Other | Admitting: Family Medicine

## 2021-10-22 ENCOUNTER — Encounter (HOSPITAL_COMMUNITY)
Admission: RE | Admit: 2021-10-22 | Discharge: 2021-10-22 | Disposition: A | Discharge status: Home / Self Care | Payer: Medicare Other | Source: Ambulatory Visit | Attending: Orthopedic Surgery | Admitting: Orthopedic Surgery

## 2021-10-22 ENCOUNTER — Other Ambulatory Visit: Payer: Self-pay

## 2021-10-22 ENCOUNTER — Encounter (HOSPITAL_COMMUNITY): Payer: Self-pay

## 2021-10-22 DIAGNOSIS — Z01818 Encounter for other preprocedural examination: Secondary | ICD-10-CM | POA: Insufficient documentation

## 2021-10-22 DIAGNOSIS — Z992 Dependence on renal dialysis: Secondary | ICD-10-CM | POA: Insufficient documentation

## 2021-10-22 DIAGNOSIS — N186 End stage renal disease: Secondary | ICD-10-CM | POA: Insufficient documentation

## 2021-10-22 NOTE — Progress Notes (Signed)
Pt does Peritoneal dialysis nightly around 2030-2130. He injects a "5 mL syringe" of heparin into a 5 L bag most nights. (Dosage changes based on how many liters of solution). Chart sent to APP for review.

## 2021-10-22 NOTE — Progress Notes (Signed)
PCP - Dr. [name] Cardiologist - denies  PPM/ICD - denies   Chest x-ray - 11/13/20 EKG - 11/13/20 Stress Test - 07/23/21 ECHO - 07/23/21 Cardiac Cath - denies  Sleep Study - denies  DM- denies  Blood Thinner Instructions: n/a Aspirin Instructions: n/a  ERAS Protcol - yes PRE-SURGERY Ensure given at PAT  COVID TEST- n/a   Anesthesia review: yes, pt injects heparin into peritoneal dialysis solution nightly around 2030 to 2100  Patient denies shortness of breath, fever, cough and chest pain at PAT appointment   All instructions explained to the patient, with a verbal understanding of the material. Patient agrees to go over the instructions while at home for a better understanding. The opportunity to ask questions was provided.

## 2021-10-23 NOTE — Anesthesia Preprocedure Evaluation (Signed)
Anesthesia Evaluation Anesthesia Physical Anesthesia Plan  ASA:   Anesthesia Plan:    Post-op Pain Management:    Induction:   PONV Risk Score and Plan:   Airway Management Planned:   Additional Equipment:   Intra-op Plan:   Post-operative Plan:   Informed Consent:   Plan Discussed with:   Anesthesia Plan Comments: (PAT note by Karoline Caldwell, PA-C: ESRD on home peritoneal dialysis. He injects 73mL of heparin into 5L bag of dialysate nightly.   Recent stress echo as part of transplant workup 07/23/21 was negative for ischemia, low risk.  Pt to have istat 8 on DOS.  EKG 11/13/20: Sinus tach. Rate 114.  Stress echo 07/23/21: 1. Functional capacity is average.  2. Hypertensive response to exercise.  3. This is a negative stress echocardiogram for ischemia.  4. This is a low risk study.   )        Anesthesia Quick Evaluation

## 2021-10-23 NOTE — Progress Notes (Signed)
Anesthesia Chart Review:  ESRD on home peritoneal dialysis. He injects 41mL of heparin into 5L bag of dialysate nightly.   Recent stress echo as part of transplant workup 07/23/21 was negative for ischemia, low risk.  Pt to have istat 8 on DOS.  EKG 11/13/20: Sinus tach. Rate 114.  Stress echo 07/23/21:  1. Functional capacity is average.   2. Hypertensive response to exercise.   3. This is a negative stress echocardiogram for ischemia.   4. This is a low risk study.     Wynonia Musty [facility]/Anesthesiology Phone [phone] 10/23/2021 12:49 PM

## 2021-10-29 ENCOUNTER — Encounter (HOSPITAL_COMMUNITY): Payer: Self-pay | Admitting: Orthopedic Surgery

## 2021-10-29 ENCOUNTER — Ambulatory Visit (HOSPITAL_BASED_OUTPATIENT_CLINIC_OR_DEPARTMENT_OTHER): Payer: Medicare Other | Admitting: Anesthesiology

## 2021-10-29 ENCOUNTER — Encounter (HOSPITAL_COMMUNITY)
Admission: RE | Disposition: A | Discharge status: Home / Self Care | Payer: Self-pay | Source: Home / Self Care | Attending: Orthopedic Surgery

## 2021-10-29 ENCOUNTER — Ambulatory Visit (HOSPITAL_COMMUNITY): Payer: Medicare Other | Admitting: Physician Assistant

## 2021-10-29 ENCOUNTER — Other Ambulatory Visit (HOSPITAL_COMMUNITY): Payer: Self-pay

## 2021-10-29 ENCOUNTER — Other Ambulatory Visit: Payer: Self-pay

## 2021-10-29 ENCOUNTER — Ambulatory Visit (HOSPITAL_COMMUNITY)
Admission: RE | Admit: 2021-10-29 | Discharge: 2021-10-29 | Disposition: A | Discharge status: Home / Self Care | Payer: Medicare Other | Attending: Orthopedic Surgery | Admitting: Orthopedic Surgery

## 2021-10-29 DIAGNOSIS — X58XXXA Exposure to other specified factors, initial encounter: Secondary | ICD-10-CM | POA: Diagnosis not present

## 2021-10-29 DIAGNOSIS — N186 End stage renal disease: Secondary | ICD-10-CM | POA: Insufficient documentation

## 2021-10-29 DIAGNOSIS — S76111A Strain of right quadriceps muscle, fascia and tendon, initial encounter: Secondary | ICD-10-CM

## 2021-10-29 DIAGNOSIS — Z992 Dependence on renal dialysis: Secondary | ICD-10-CM | POA: Insufficient documentation

## 2021-10-29 DIAGNOSIS — S8331XA Tear of articular cartilage of right knee, current, initial encounter: Secondary | ICD-10-CM

## 2021-10-29 DIAGNOSIS — S83411A Sprain of medial collateral ligament of right knee, initial encounter: Secondary | ICD-10-CM | POA: Insufficient documentation

## 2021-10-29 HISTORY — PX: QUADRICEPS TENDON REPAIR: SHX756

## 2021-10-29 LAB — POCT I-STAT, CHEM 8
BUN: 74 mg/dL — ABNORMAL HIGH (ref 6–20)
Calcium, Ion: 1.12 mmol/L — ABNORMAL LOW (ref 1.15–1.40)
Chloride: 96 mmol/L — ABNORMAL LOW (ref 98–111)
Creatinine, Ser: 16.7 mg/dL — ABNORMAL HIGH (ref 0.61–1.24)
Glucose, Bld: 167 mg/dL — ABNORMAL HIGH (ref 70–99)
HCT: 24 % — ABNORMAL LOW (ref 39.0–52.0)
Hemoglobin: 8.2 g/dL — ABNORMAL LOW (ref 13.0–17.0)
Potassium: 3.2 mmol/L — ABNORMAL LOW (ref 3.5–5.1)
Sodium: 136 mmol/L (ref 135–145)
TCO2: 24 mmol/L (ref 22–32)

## 2021-10-29 SURGERY — REPAIR, TENDON, QUADRICEPS
Anesthesia: General | Laterality: Right

## 2021-10-29 MED ORDER — HYDROMORPHONE HCL 1 MG/ML IJ SOLN
0.2500 mg | INTRAMUSCULAR | Status: DC | PRN
  Administered 2021-10-29 (×2): 0.5 mg via INTRAVENOUS

## 2021-10-29 MED ORDER — OXYCODONE HCL 5 MG/5ML PO SOLN: 5.0000 mg | Freq: Once | ORAL | Status: DC | PRN

## 2021-10-29 MED ORDER — PHENYLEPHRINE 80 MCG/ML (10ML) SYRINGE FOR IV PUSH (FOR BLOOD PRESSURE SUPPORT)
PREFILLED_SYRINGE | INTRAVENOUS | Status: DC | PRN
  Administered 2021-10-29 (×4): 160 ug via INTRAVENOUS

## 2021-10-29 MED ORDER — PROMETHAZINE HCL 12.5 MG PO TABS
12.5000 mg | ORAL_TABLET | Freq: Four times a day (QID) | ORAL | 0 refills | Status: DC | PRN
  Filled 2021-10-29: qty 30, 8d supply, fill #0

## 2021-10-29 MED ORDER — HYDROMORPHONE HCL 1 MG/ML IJ SOLN
INTRAMUSCULAR | Status: AC
  Filled 2021-10-29: qty 1

## 2021-10-29 MED ORDER — CHLORHEXIDINE GLUCONATE 0.12 % MT SOLN
OROMUCOSAL | Status: DC
Start: 2021-10-29 — End: 2021-10-29
  Filled 2021-10-29: qty 15

## 2021-10-29 MED ORDER — ROCURONIUM BROMIDE 10 MG/ML (PF) SYRINGE
PREFILLED_SYRINGE | INTRAVENOUS | Status: AC
  Filled 2021-10-29: qty 10

## 2021-10-29 MED ORDER — MIDAZOLAM HCL 2 MG/2ML IJ SOLN
INTRAMUSCULAR | Status: AC
  Filled 2021-10-29: qty 2

## 2021-10-29 MED ORDER — LIDOCAINE-EPINEPHRINE 2 %-1:100000 IJ SOLN
INTRAMUSCULAR | Status: AC
  Filled 2021-10-29: qty 1

## 2021-10-29 MED ORDER — DEXAMETHASONE SODIUM PHOSPHATE 10 MG/ML IJ SOLN
INTRAMUSCULAR | Status: AC
  Filled 2021-10-29: qty 1

## 2021-10-29 MED ORDER — ROPIVACAINE HCL 7.5 MG/ML IJ SOLN
INTRAMUSCULAR | Status: DC | PRN
  Administered 2021-10-29: 20 mL via PERINEURAL

## 2021-10-29 MED ORDER — SUGAMMADEX SODIUM 200 MG/2ML IV SOLN
INTRAVENOUS | Status: DC | PRN
  Administered 2021-10-29: 200 mg via INTRAVENOUS

## 2021-10-29 MED ORDER — LIDOCAINE 2% (20 MG/ML) 5 ML SYRINGE
INTRAMUSCULAR | Status: DC | PRN
  Administered 2021-10-29: 20 mg via INTRAVENOUS

## 2021-10-29 MED ORDER — ACETAMINOPHEN 500 MG PO TABS
1000.0000 mg | ORAL_TABLET | Freq: Once | ORAL | Status: AC
  Administered 2021-10-29: 1000 mg via ORAL
  Filled 2021-10-29: qty 2

## 2021-10-29 MED ORDER — LIDOCAINE 2% (20 MG/ML) 5 ML SYRINGE
INTRAMUSCULAR | Status: AC
  Filled 2021-10-29: qty 5

## 2021-10-29 MED ORDER — MEPERIDINE HCL 25 MG/ML IJ SOLN: 6.2500 mg | INTRAMUSCULAR | Status: DC | PRN

## 2021-10-29 MED ORDER — MIDAZOLAM HCL 2 MG/2ML IJ SOLN
INTRAMUSCULAR | Status: DC | PRN
  Administered 2021-10-29: 2 mg via INTRAVENOUS

## 2021-10-29 MED ORDER — ONDANSETRON HCL 4 MG/2ML IJ SOLN
INTRAMUSCULAR | Status: DC | PRN
  Administered 2021-10-29: 4 mg via INTRAVENOUS

## 2021-10-29 MED ORDER — ALBUMIN HUMAN 5 % IV SOLN: INTRAVENOUS | Status: DC | PRN

## 2021-10-29 MED ORDER — PROPOFOL 10 MG/ML IV BOLUS
INTRAVENOUS | Status: AC
  Filled 2021-10-29: qty 20

## 2021-10-29 MED ORDER — PROPOFOL 10 MG/ML IV BOLUS
INTRAVENOUS | Status: DC | PRN
  Administered 2021-10-29: 100 mg via INTRAVENOUS
  Administered 2021-10-29: 20 mg via INTRAVENOUS
  Administered 2021-10-29 (×3): 30 mg via INTRAVENOUS
  Administered 2021-10-29: 20 mg via INTRAVENOUS
  Administered 2021-10-29: 30 mg via INTRAVENOUS

## 2021-10-29 MED ORDER — FENTANYL CITRATE (PF) 250 MCG/5ML IJ SOLN
INTRAMUSCULAR | Status: AC
  Filled 2021-10-29: qty 5

## 2021-10-29 MED ORDER — OXYCODONE HCL 5 MG PO TABS: 5.0000 mg | ORAL_TABLET | Freq: Once | ORAL | Status: DC | PRN

## 2021-10-29 MED ORDER — DEXAMETHASONE SODIUM PHOSPHATE 10 MG/ML IJ SOLN
INTRAMUSCULAR | Status: DC | PRN
  Administered 2021-10-29: 5 mg via INTRAVENOUS

## 2021-10-29 MED ORDER — HYDROMORPHONE HCL 1 MG/ML IJ SOLN
INTRAMUSCULAR | Status: AC
  Filled 2021-10-29: qty 0.5

## 2021-10-29 MED ORDER — CHLORHEXIDINE GLUCONATE 0.12 % MT SOLN
15.0000 mL | Freq: Once | OROMUCOSAL | Status: AC
  Administered 2021-10-29: 15 mL via OROMUCOSAL

## 2021-10-29 MED ORDER — HYDROMORPHONE HCL 1 MG/ML IJ SOLN
INTRAMUSCULAR | Status: DC | PRN
  Administered 2021-10-29: .5 mg via INTRAVENOUS

## 2021-10-29 MED ORDER — ROCURONIUM BROMIDE 10 MG/ML (PF) SYRINGE
PREFILLED_SYRINGE | INTRAVENOUS | Status: DC | PRN
  Administered 2021-10-29: 20 mg via INTRAVENOUS
  Administered 2021-10-29: 40 mg via INTRAVENOUS

## 2021-10-29 MED ORDER — 0.9 % SODIUM CHLORIDE (POUR BTL) OPTIME
TOPICAL | Status: DC | PRN
  Administered 2021-10-29: 1000 mL

## 2021-10-29 MED ORDER — ORAL CARE MOUTH RINSE: 15.0000 mL | Freq: Once | OROMUCOSAL | Status: AC

## 2021-10-29 MED ORDER — PROMETHAZINE HCL 25 MG/ML IJ SOLN: 6.2500 mg | INTRAMUSCULAR | Status: DC | PRN

## 2021-10-29 MED ORDER — LACTATED RINGERS IV SOLN: INTRAVENOUS | Status: DC

## 2021-10-29 MED ORDER — PHENYLEPHRINE 80 MCG/ML (10ML) SYRINGE FOR IV PUSH (FOR BLOOD PRESSURE SUPPORT)
PREFILLED_SYRINGE | INTRAVENOUS | Status: AC
  Filled 2021-10-29: qty 10

## 2021-10-29 MED ORDER — MIDAZOLAM HCL 2 MG/2ML IJ SOLN: 0.5000 mg | Freq: Once | INTRAMUSCULAR | Status: DC | PRN

## 2021-10-29 MED ORDER — OXYCODONE HCL 5 MG PO TABS
5.0000 mg | ORAL_TABLET | Freq: Four times a day (QID) | ORAL | 0 refills | Status: DC | PRN
  Filled 2021-10-29: qty 30, 8d supply, fill #0

## 2021-10-29 MED ORDER — FENTANYL CITRATE (PF) 250 MCG/5ML IJ SOLN
INTRAMUSCULAR | Status: DC | PRN
  Administered 2021-10-29: 100 ug via INTRAVENOUS
  Administered 2021-10-29 (×2): 25 ug via INTRAVENOUS
  Administered 2021-10-29: 100 ug via INTRAVENOUS

## 2021-10-29 MED ORDER — CEFAZOLIN SODIUM-DEXTROSE 2-4 GM/100ML-% IV SOLN
2.0000 g | INTRAVENOUS | Status: AC
  Administered 2021-10-29: 2 g via INTRAVENOUS
  Filled 2021-10-29: qty 100

## 2021-10-29 MED ORDER — SODIUM CHLORIDE 0.9 % IV SOLN: INTRAVENOUS | Status: DC | PRN

## 2021-10-29 MED ORDER — ONDANSETRON HCL 4 MG/2ML IJ SOLN
INTRAMUSCULAR | Status: AC
  Filled 2021-10-29: qty 2

## 2021-10-29 SURGICAL SUPPLY — 50 items
ADH SKN CLS APL DERMABOND .7 (GAUZE/BANDAGES/DRESSINGS) ×1
APL PRP STRL LF DISP 70% ISPRP (MISCELLANEOUS) ×2
BLADE SURG 15 STRL LF DISP TIS (BLADE) ×2 IMPLANT
BLADE SURG 15 STRL SS (BLADE) ×2
BNDG CMPR MED 10X6 ELC LF (GAUZE/BANDAGES/DRESSINGS) ×1
BNDG ELASTIC 6X10 VLCR STRL LF (GAUZE/BANDAGES/DRESSINGS) ×3 IMPLANT
CHLORAPREP W/TINT 26 (MISCELLANEOUS) ×4 IMPLANT
CUFF TOURN SGL QUICK 34 (TOURNIQUET CUFF) ×2
CUFF TRNQT CYL 34X4.125X (TOURNIQUET CUFF) ×2 IMPLANT
DERMABOND ADVANCED (GAUZE/BANDAGES/DRESSINGS) ×1
DERMABOND ADVANCED .7 DNX12 (GAUZE/BANDAGES/DRESSINGS) IMPLANT
DRAPE BILATERAL LIMB T (DRAPES) IMPLANT
DRAPE U-SHAPE 47X51 STRL (DRAPES) ×3 IMPLANT
DRSG AQUACEL AG ADV 3.5X10 (GAUZE/BANDAGES/DRESSINGS) ×1 IMPLANT
ELECT REM PT RETURN 9FT ADLT (ELECTROSURGICAL) ×2
ELECTRODE REM PT RTRN 9FT ADLT (ELECTROSURGICAL) ×2 IMPLANT
GLOVE SRG 8 PF TXTR STRL LF DI (GLOVE) ×2 IMPLANT
GLOVE SURG ORTHO LTX SZ7.5 (GLOVE) ×6 IMPLANT
GLOVE SURG UNDER POLY LF SZ8 (GLOVE) ×2
GOWN STRL REIN XL XLG (GOWN DISPOSABLE) ×3 IMPLANT
GOWN STRL REUS W/ TWL LRG LVL3 (GOWN DISPOSABLE) ×2 IMPLANT
GOWN STRL REUS W/ TWL XL LVL3 (GOWN DISPOSABLE) ×2 IMPLANT
GOWN STRL REUS W/TWL LRG LVL3 (GOWN DISPOSABLE) ×2
GOWN STRL REUS W/TWL XL LVL3 (GOWN DISPOSABLE) ×2
KIT BASIN OR (CUSTOM PROCEDURE TRAY) ×3 IMPLANT
NDL KEITH (NEEDLE) ×2 IMPLANT
NEEDLE HYPO 22GX1.5 SAFETY (NEEDLE) ×3 IMPLANT
NEEDLE KEITH (NEEDLE) ×2 IMPLANT
NS IRRIG 1000ML POUR BTL (IV SOLUTION) ×3 IMPLANT
PACK ORTHO EXTREMITY (CUSTOM PROCEDURE TRAY) ×3 IMPLANT
PENCIL SMOKE EVACUATOR (MISCELLANEOUS) ×3 IMPLANT
SLEEVE SCD COMPRESS KNEE MED (STOCKING) ×3 IMPLANT
SPONGE T-LAP 18X18 ~~LOC~~+RFID (SPONGE) ×3 IMPLANT
STRIP CLOSURE SKIN 1/2X4 (GAUZE/BANDAGES/DRESSINGS) ×1 IMPLANT
SUT FIBERWIRE #2 38 REV NDL BL (SUTURE) ×4
SUT FIBERWIRE #2 38 T-5 BLUE (SUTURE) ×2
SUT MNCRL AB 3-0 PS2 18 (SUTURE) ×3 IMPLANT
SUT MON AB 2-0 CT1 27 (SUTURE) ×3 IMPLANT
SUT MON AB 2-0 CT1 36 (SUTURE) ×1 IMPLANT
SUT VIC AB 0 CT1 27 (SUTURE) ×12
SUT VIC AB 0 CT1 27XBRD ANBCTR (SUTURE) IMPLANT
SUT VIC AB 1 CT1 27 (SUTURE) ×2
SUT VIC AB 1 CT1 27XBRD ANBCTR (SUTURE) ×4 IMPLANT
SUT VICRYL 0 TIES 12 18 (SUTURE) ×3 IMPLANT
SUTURE FIBERWR #2 38 T-5 BLUE (SUTURE) IMPLANT
SUTURE FIBERWR#2 38 REV NDL BL (SUTURE) ×4 IMPLANT
SYR CONTROL 10ML LL (SYRINGE) IMPLANT
TOWEL GREEN STERILE FF (TOWEL DISPOSABLE) ×6 IMPLANT
TUBE CONNECTING 20X1/4 (TUBING) ×3 IMPLANT
UNDERPAD 30X36 HEAVY ABSORB (UNDERPADS AND DIAPERS) ×3 IMPLANT

## 2021-10-29 NOTE — Anesthesia Postprocedure Evaluation (Signed)
Anesthesia Post Note  Patient: Micheal Bean  Procedure(s) Performed: RIGHT QUADRICEP REPAIR AND PRIMARY REPAIR ANTERIOR CAPSULE (Right)     Patient location during evaluation: PACU Anesthesia Type: General Level of consciousness: awake and alert, patient cooperative and oriented Pain management: pain level controlled (pain very much improved) Vital Signs Assessment: post-procedure vital signs reviewed and stable Respiratory status: spontaneous breathing, nonlabored ventilation and respiratory function stable Cardiovascular status: stable and blood pressure returned to baseline Postop Assessment: no apparent nausea or vomiting Anesthetic complications: no   No notable events documented.  Last Vitals:  Vitals:   10/29/21 0950 10/29/21 1005  BP: 136/83 (!) 141/96  Pulse: 93 97  Resp: 12 15  Temp:    SpO2: 98% 100%    Last Pain:  Vitals:   10/29/21 1005  TempSrc:   PainSc: 10-Worst pain ever                 Edon Hoadley,E. Venita Seng

## 2021-10-29 NOTE — Progress Notes (Signed)
Patient discharged at this time patient ate cracker and drank soda reports feeling more awake was able to receive instructions regarding usage of crutches was able to perform task to staff patient made aware not to use crutches till tomorrow per MD

## 2021-10-29 NOTE — Discharge Instructions (Signed)
Discharge instructions for Dr. Georgeanna Harrison, M.D.: Please refer to the two-sided discharge instructions paper that Dr. Mable Fill placed in the patient's paper chart. Please give this to the patient to take home after reviewing with the patient!!   General discharge instructions:  PLEASE REFER TO TWO-SIDED PAPER INSTRUCTIONS IN [place]!!!  Diet: As you were doing prior to hospitalization. Shower:  Unless otherwise specified (i.e. on two-sided paper instructions with paper chart) may shower but keep the wounds dry, use an occlusive plastic wrap, NO SOAKING IN TUB.  If the bandage gets wet, change with a clean dry gauze. Dressing:  Unless otherwise specified (i.e. on two-sided paper instructions with paper chart), may change your dressing 3-5 days after surgery.  Then change the dressing daily with sterile gauze dressing.  If there are sticky tapes (steri-strips) on your wounds and all the stitches are absorbable.  Leave the steri-strips in place when changing your dressings, they will peel off with time, usually 2-3 weeks. Activity:  Increase activity slowly as tolerated, but follow the restrictions on the two-sided paper discharge instructions sheet that Dr. Mable Fill placed in the paper chart.  No lifting or driving for 6 weeks. Weight Bearing: TOE-TOUCH WEIGHTBEARING (TTWB) on the RIGHT LOWER EXTREMITY. To prevent constipation: You may use over-the-counter stool softener(s) such as Colace (over the counter) 100 mg by mouth twice a day and/or Miralax (over the counter) for constipation as needed.  Drink plenty of fluids (prune juice may be helpful) and high fiber foods.  Itching:  If you experience itching with your medications, try taking only a single pain pill, or even half a pain pill at a time.  You can also use benadryl over the counter for itching or also to help with sleep.  Precautions:  If you experience chest pain or shortness of breath - call 911 immediately for  transfer to the hospital emergency department!!  PLEASE REFER TO TWO-SIDED PAPER INSTRUCTIONS IN PAPER CHART FOR SPECIFIC INSTRUCTIONS!!!  If you develop a fever greater that 101.1 deg F, purulent drainage from wound, increased redness or drainage from wound, or calf pain -- Call the office at [phone].

## 2021-10-29 NOTE — Transfer of Care (Signed)
Immediate Anesthesia Transfer of Care Note  Patient: Micheal Bean  Procedure(s) Performed: RIGHT QUADRICEP REPAIR AND PRIMARY REPAIR ANTERIOR CAPSULE (Right)  Patient Location: PACU  Anesthesia Type:General  Level of Consciousness: awake  Airway & Oxygen Therapy: Patient Spontanous Breathing  Post-op Assessment: Report given to RN and Post -op Vital signs reviewed and stable  Post vital signs: Reviewed and stable  Last Vitals:  Vitals Value Taken Time  BP 137/78 10/29/21 0935  Temp    Pulse 90 10/29/21 0940  Resp 19 10/29/21 0940  SpO2 98 % 10/29/21 0940  Vitals shown include unvalidated device data.  Last Pain:  Vitals:   10/29/21 9191  TempSrc:   PainSc: 3       Patients Stated Pain Goal: 0 (66/06/00 4599)  Complications: No notable events documented.

## 2021-10-29 NOTE — Op Note (Signed)
OPERATIVE NOTE  Micheal Bean male 36 y.o. 10/29/2021  PREOPERATIVE DIAGNOSIS: Right quadriceps tear Right knee anterior capsule tear  POSTOPERATIVE DIAGNOSIS: Right knee quadriceps avulsion (J62.831D) Right knee medial and lateral anterior capsule retinacular tears (V76.1Y0V) Right superior patella pole avulsion fracture (P71.062I)  PROCEDURE(S): Right knee primary pair medial and lateral capsular tears (27405) Right knee quadriceps repair (94854) Right knee partial patellectomy with excision of avulsed fragment (62703)  SURGEON: Georgeanna Harrison, M.D.  ASSISTANT(S): Wandra Arthurs, RNFA  ANESTHESIA: Choice  FINDINGS: Preoperative Examination: RLE: Examination of the right knee demonstrates skin intact and benign.  There is a persistent effusion with a palpable defect proximal to the superior pole of the patella extending medially and laterally into the anterior capsular retinacular area.  He is unable to maintain a straight leg raise against gravity.  Intact dorsiflexion, plantarflexion, and EHL strength and function.  Sensation intact light touch distally in the superficial peroneal, deep peroneal, and tibial distributions.  Normal distal pulses.  Warm and well-perfused distally.  Operative Findings: Complete extensor disruption with quadriceps avulsion off the superior pole of patella, including transverse ossific fragment of avulsed superior patellar pole.  Extension of injury medially and laterally into the anterior capsular retinaculum.  Solid repair of quadriceps to patella, with primary pair of capsular rents.  IMPLANTS: * No implants in log *  INDICATIONS:  The patient is a 36 y.o. male who sustained an extensor mechanism disruption.  MRI confirmed quadriceps tear with extension into the medial and lateral anterior capsule.  In order to restore his previous level of function he was recommended to undergo primary repair of the extensor mechanism.  He understood the risks,  benefits and alternatives to surgery which include but are not limited to bleeding, wound healing complications, infection, damage to surrounding structures, persistent pain, stiffness, lack of improvement, potential for subsequent arthritis or worsening of pre-existing arthritis, and need for further surgery, as well as complications related to anesthesia, cardiovascular complications, and death.  He also understood the potential for continued pain, and that there were no guarantees of acceptable outcome.  After weighing these risks the patient opted to proceed with surgery.  TECHNIQUE: Patient was identified in the preoperative holding area.  The right knee and thigh was confirmed as the appropriate operative site and marked by me.  Consent was signed by myself and the patient and witnessed by the preoperative nurse.  Femoral block was performed by anesthesia in the preoperative holding area.  Patient was taken to the operative suite and placed supine on the operative table.  Anesthesia was induced by the anesthesia team.  The patient was positioned appropriately for the procedure and all bony prominences were well padded.  A non-sterile thigh tourniquet was placed on the operative extremity.  Preoperative antibiotics were given. The extremity was prepped and draped in the usual sterile fashion and surgical timeout was performed.  Surface anatomy was marked out anteriorly.  A longitudinal incision was marked out over the knee.  Extremity was exsanguinated and Esmarch bandage and tourniquet inflated to 300 mmHg.  Skin was incised sharply.  Underlying subcutaneous tissues were dissected with Bovie electrocautery down to the knee retinacular layer.  Flaps were raised medially and laterally, and the injury was exposed and evaluated.  The quadriceps tendon had avulsed off of the superior pole of patella, and there was also a displaced transverse fragment of the superior pole of the patella which had been avulsed off  of the rest of the patella.  The injury propagated both medially and laterally with retinacular rents and tears through the knee joint capsule medially and laterally.  The knee was thoroughly irrigated.  Suboptimal devitalized tissue was debrided off of the end of the quadriceps tendon as well as off the superior patellar pole.  The small proximal fragment of displaced transverse superior patellar pole fracture was excised using combination of sharp dissection and Bovie electrocautery.  This fragment had minimal articular cartilage, and was not felt to be suitable for primary ossific repair.  Running locking Krakw sutures with #5 FiberWire will run up and down the quadriceps tendon medially and laterally.  3 parallel drill holes were drilled through the patella, and pairs of sutures were retrieved distally through slits in the patellar tendon which were made in line with the fibers.  With the knee in full extension, each pair of sutures was retrieved through the medial and lateral slit and tied with multiple square knots, securing the tendon back onto the superior patellar pole.  These knots were then buried underneath the patellar tendon by closing the slits in the tendon with figure-of-eight stitch #0 Vicryl sutures.  The medial and lateral anterior capsular tears were primarily repaired with interrupted figure-of-eight stitches using #2 FiberWire.  The knee was once again copiously irrigated and hemostasis was obtained.  Layered closure was performed with #0 Vicryl simple inverted interrupted deep, followed by simple inverted interrupted #2-0 Monocryl deep dermal, followed by running #3-0 Monocryl subcuticular.  Skin was sealed with Dermabond.  Suture tails secured with Steri-Strips.  Aquacel dressing was placed over the wound, followed by large Ace wrap.  Hinged knee brace locked in extension was fitted the patient placed on the operative extremity.  Patient was awakened from anesthesia and transferred to PACU  in stable condition.  He tolerated the procedure well.  There were no complications.  POST OPERATIVE INSTRUCTIONS: Mobility: Use crutches for ambulation Pain control: Continue to wean/titrate to appropriate oral regimen DVT Prophylaxis: 81 mg aspirin twice daily for 2 weeks RLE: Toe-touch weightbearing Dressing care: Keep AQUACEL on and dry for up to 14 days.  Do not allow surgical area to get wet before that.  Remove AQUACEL dressing after 14 days and allow area to get wet in shower but DO NOT SUBMERGE until wound is evaluated in clinic.  In most cases skin glue is used and no additional dressing is necessary.  Disposition: Home Follow-up: Please call [facility] [phone]) to schedule follow-op appointment for 2 weeks after surgery.  TOURNIQUET TIME:  Total Tourniquet Time Documented: Thigh (Right) - 77 minutes Total: Thigh (Right) - 77 minutes   BLOOD LOSS: 20 mL         DRAINS: none         SPECIMEN: none       COMPLICATIONS:  * No complications entered in OR log *         DISPOSITION: PACU - hemodynamically stable.         CONDITION: stable   Georgeanna Harrison M.D. Orthopaedic Surgery [facility]   Portions of the record have been created with voice recognition software.  Grammatical and punctuation errors, random word insertions, wrong-word or "sound-a-like" substitutions, pronoun errors (inaccuracies and/or substitutions), and/or incomplete sentences may have occurred due to the inherent limitations of voice recognition software.  Not all errors are caught or corrected.  Although every attempt is made to root out erroneous and incomplete transcription, the note may still not fully  represent the intent or opinion of the author.  Read the chart carefully and recognize, using context, where errors/substitutions have occurred.  Any questions or concerns about the content of this note or information contained within the  body of this dictation should be addressed directly with the author for clarification.

## 2021-10-29 NOTE — Anesthesia Procedure Notes (Signed)
Procedure Name: Intubation Date/Time: 10/29/2021 7:36 AM  Performed by: Erick Colace, CRNAPre-anesthesia Checklist: Patient identified, Emergency Drugs available, Suction available and Patient being monitored Patient Re-evaluated:Patient Re-evaluated prior to induction Oxygen Delivery Method: Circle system utilized Preoxygenation: Pre-oxygenation with 100% oxygen Induction Type: IV induction Ventilation: Mask ventilation without difficulty Laryngoscope Size: Mac and 4 Grade View: Grade I Tube type: Oral Number of attempts: 1 Airway Equipment and Method: Stylet and Oral airway Placement Confirmation: ETT inserted through vocal cords under direct vision, positive ETCO2 and breath sounds checked- equal and bilateral Secured at: 23 cm Tube secured with: Tape Dental Injury: Teeth and Oropharynx as per pre-operative assessment

## 2021-10-29 NOTE — H&P (Signed)
PREOPERATIVE H&P  HPI: Micheal Bean is a 36 y.o. male with a history of chronic kidney disease on dialysis who has presented today for surgery, with the diagnosis of right knee extensor mechanism disruption with quadriceps tear.  The various methods of treatment have been discussed with the patient and family.  After consideration of risks, benefits, and other options for treatment, the patient has consented to RIGHT QUADRICEP [place] as a surgical intervention.  The patient's history has been reviewed, patient examined, no change in status, stable for surgery.  I have reviewed the patient's chart and labs.  Questions were answered to the patient's satisfaction.    PMH: Past Medical History:  Diagnosis Date   Anemia    ESRD (end stage renal disease) ([place])    PD at home   History of blood transfusion 11/2020   4 units   History of blood transfusion    Seizure ([place])    x 1 in [facility]- 02/2021    Home Medications Allergies  No current facility-administered medications on file prior to encounter.   Current Outpatient Medications on File Prior to Encounter  Medication Sig Dispense Refill   calcitRIOL (ROCALTROL) 0.25 MCG capsule Take 0.25 mcg by mouth daily.     multivitamin (RENA-VIT) TABS tablet TAKE 1 TABLET BY MOUTH EVERYDAY AT BEDTIME (Patient taking differently: Take 1 tablet by mouth daily.) 30 tablet 1   oxyCODONE-acetaminophen (PERCOCET/ROXICET) 5-325 MG tablet Take 1-2 tablets by mouth every 6 (six) hours as needed for severe pain. 15 tablet 0   SENSIPAR 30 MG tablet Take 60 mg by mouth daily.     sevelamer carbonate (RENVELA) 800 MG tablet Take 1,600 mg by mouth 3 (three) times daily.     No Known Allergies   PSH: Past Surgical History:  Procedure Laterality Date   AV FISTULA PLACEMENT Right 03/02/2021   Procedure: RIGHT ARM Radio-Cephalic  ARTERIOVENOUS (AV) FISTULA CREATION;  Surgeon: [name], MD;  Location: [facility] OR;  Service:  Vascular;  Laterality: Right;   CAPD INSERTION N/A 05/20/2021   Procedure: LAPAROSCOPIC INSERTION CONTINUOUS AMBULATORY PERITONEAL DIALYSIS  CATHETER WITH POSSIBLE OMENTOPEXY;  Surgeon: Cherre Robins, MD;  Location: [facility] OR;  Service: Vascular;  Laterality: N/A;   IR FLUORO GUIDE CV LINE RIGHT  11/18/2020   IR US GUIDE VASC ACCESS RIGHT  11/18/2020   NEPHRECTOMY     childhhod, RT side     Family History Social History  Family History  Problem Relation Age of Onset   Hypertension Mother    Kidney disease Maternal Grandmother     Social History   Socioeconomic History   Marital status: Single    Spouse name: Not on file   Number of children: 2   Years of education: Not on file   Highest education level: Not on file  Occupational History   Not on file  Tobacco Use   Smoking status: Never   Smokeless tobacco: Never  Vaping Use   Vaping Use: Never used  Substance and Sexual Activity   Alcohol use: Not Currently   Drug use: Not Currently   Sexual activity: Not on file  Other Topics Concern   Not on file  Social History Narrative   Not on file   Social Determinants of Health   Financial Resource Strain: Not on file  Food Insecurity: Not on file  Transportation Needs: Not on file  Physical Activity: Not on file  Stress: Not on file  Social Connections: Not on file     Review of Systems: MSK: As noted per HPI above GI: No current Nausea/vomiting ENT: Denies sore throat, epistaxis CV: Denies chest pain Resp: No current shortness of breath  Other than mentioned above, there are no Constitutional, Neurological, Psychiatric, ENT, Ophthalmological, Cardiovascular, Respiratory, GI, GU, Musculoskeletal, Integumentary, Lymphatic, Endocrine or Allergic issues.   Physical Examination: CV: Normal distal pulses Lungs: Unlabored respirations RLE: Examination of the right knee demonstrates skin intact and benign.  There is a persistent effusion with a palpable defect proximal to  the superior pole of the patella extending medially and laterally into the anterior capsular retinacular area.  He is unable to maintain a straight leg raise against gravity.  Intact dorsiflexion, plantarflexion, and EHL strength and function.  Sensation intact light touch distally in the superficial peroneal, deep peroneal, and tibial distributions.  Normal distal pulses.  Warm and well-perfused distally.  Assessment/Plan: RIGHT QUADRICEP REPAIR AND PRIMARY REPAIR ANTERIOR CAPSULE    Georgeanna Harrison M.D. Orthopaedic Surgery [facility]  Review of this patient's medications prescribed by other providers does not in any way constitute an endorsement by this clinician of their use, indications, dosage, route, efficacy, interactions, or other clinical parameters.  Portions of the record have been created with voice recognition software.  Grammatical and punctuation errors, random word insertions, wrong-word or "sound-a-like" substitutions, pronoun errors (inaccuracies and/or substitutions), and/or incomplete sentences may have occurred due to the inherent limitations of voice recognition software.  Not all errors are caught or corrected.  Although every attempt is made to root out erroneous and incomplete transcription, the note may still not fully represent the intent or opinion of the author.  Read the chart carefully and recognize, using context, where errors/substitutions have occurred.  Any questions or concerns about the content of this note or information contained within the body of this dictation should be addressed directly with the author for clarification.

## 2021-10-29 NOTE — Anesthesia Procedure Notes (Signed)
Anesthesia Regional Block: Femoral nerve block   Pre-Anesthetic Checklist: , timeout performed,  Correct Patient, Correct Site, Correct Laterality,  Correct Procedure, Correct Position, site marked,  Risks and benefits discussed,  Surgical consent,  Pre-op evaluation,  At surgeon's request and post-op pain management  Laterality: Right and Lower  Prep: chloraprep       Needles:  Injection technique: Single-shot  Needle Type: Echogenic Needle     Needle Length: 9cm  Needle Gauge: 21     Additional Needles:   Procedures:,,,, ultrasound used (permanent image in chart),,    Narrative:  Start time: 10/29/2021 6:50 AM End time: 10/29/2021 6:56 AM Injection made incrementally with aspirations every 5 mL.  Performed by: Personally  Anesthesiologist: Annye Asa, MD  Additional Notes: Pt identified in Holding room.  Monitors applied. Working IV access confirmed. Sterile prep R groin.  #21ga ECHOgenic Arrow block needle to fem nerve in fem canal with US guidance.  20cc 0.75% Ropivacaine injected incrementally after negative test dose.  Patient asymptomatic, VSS, no heme aspirated, tolerated well.   Jenita Seashore, MD

## 2021-10-29 NOTE — Progress Notes (Signed)
Orthopedic Tech Progress Note Patient Details:  Micheal Bean Mar 13, 1986 [id number] Patient was not able to get out of bed to use crutches due to his recent surgery and I may have to instruct him once again on how to use them. Patient repeated the steps on how to use them after being shown.  Ortho Devices Type of Ortho Device: Crutches Ortho Device/Splint Interventions: Ordered, Adjustment   Post Interventions Instructions Provided: Poper ambulation with device  [name] E Jerrett Baldinger 10/29/2021, 10:34 AM

## 2021-10-29 NOTE — Progress Notes (Signed)
Dr. Jenita Seashore made aware of today's ISTAT result- hemoglobin and hematocrit.

## 2021-10-29 NOTE — Progress Notes (Signed)
Orthopedic Tech Progress Note Patient Details:  Micheal Bean [birth year] [id number] Patient is still unable to use crutches at this time.  Patient ID: [name], male   DOB: 10/15/1985, 36 y.o.   MRN: [id number]  [name] [name] 10/29/2021, 11:14 AM

## 2021-10-30 ENCOUNTER — Encounter (HOSPITAL_COMMUNITY): Payer: Self-pay | Admitting: Orthopedic Surgery

## 2021-11-25 ENCOUNTER — Encounter (HOSPITAL_COMMUNITY): Payer: Self-pay

## 2021-11-25 ENCOUNTER — Other Ambulatory Visit: Payer: Self-pay

## 2021-11-25 ENCOUNTER — Emergency Department (HOSPITAL_COMMUNITY)
Admission: EM | Admit: 2021-11-25 | Discharge: 2021-11-25 | Disposition: A | Discharge status: Home / Self Care | Payer: Medicare Other | Attending: Emergency Medicine | Admitting: Emergency Medicine

## 2021-11-25 DIAGNOSIS — K625 Hemorrhage of anus and rectum: Secondary | ICD-10-CM | POA: Insufficient documentation

## 2021-11-25 DIAGNOSIS — R5383 Other fatigue: Secondary | ICD-10-CM | POA: Diagnosis not present

## 2021-11-25 DIAGNOSIS — R197 Diarrhea, unspecified: Secondary | ICD-10-CM | POA: Diagnosis not present

## 2021-11-25 DIAGNOSIS — R531 Weakness: Secondary | ICD-10-CM | POA: Insufficient documentation

## 2021-11-25 DIAGNOSIS — Z992 Dependence on renal dialysis: Secondary | ICD-10-CM | POA: Diagnosis not present

## 2021-11-25 DIAGNOSIS — R0602 Shortness of breath: Secondary | ICD-10-CM | POA: Insufficient documentation

## 2021-11-25 DIAGNOSIS — N185 Chronic kidney disease, stage 5: Secondary | ICD-10-CM | POA: Insufficient documentation

## 2021-11-25 LAB — COMPREHENSIVE METABOLIC PANEL
ALT: 22 U/L (ref 0–44)
AST: 18 U/L (ref 15–41)
Albumin: 3.1 g/dL — ABNORMAL LOW (ref 3.5–5.0)
Alkaline Phosphatase: 162 U/L — ABNORMAL HIGH (ref 38–126)
Anion gap: 15 (ref 5–15)
BUN: 43 mg/dL — ABNORMAL HIGH (ref 6–20)
CO2: 24 mmol/L (ref 22–32)
Calcium: 8.4 mg/dL — ABNORMAL LOW (ref 8.9–10.3)
Chloride: 101 mmol/L (ref 98–111)
Creatinine, Ser: 15.34 mg/dL — ABNORMAL HIGH (ref 0.61–1.24)
GFR, Estimated: 4 mL/min — ABNORMAL LOW (ref >60)
Glucose, Bld: 93 mg/dL (ref 70–99)
Potassium: 3.8 mmol/L (ref 3.5–5.1)
Sodium: 140 mmol/L (ref 135–145)
Total Bilirubin: 0.4 mg/dL (ref 0.3–1.2)
Total Protein: 7.6 g/dL (ref 6.5–8.1)

## 2021-11-25 LAB — TYPE AND SCREEN
ABO/RH(D): B POS
Antibody Screen: NEGATIVE

## 2021-11-25 LAB — CBC
HCT: 27.7 % — ABNORMAL LOW (ref 39.0–52.0)
Hemoglobin: 8.1 g/dL — ABNORMAL LOW (ref 13.0–17.0)
MCH: 28.5 pg (ref 26.0–34.0)
MCHC: 29.2 g/dL — ABNORMAL LOW (ref 30.0–36.0)
MCV: 97.5 fL (ref 80.0–100.0)
Platelets: 776 10*3/uL — ABNORMAL HIGH (ref 150–400)
RBC: 2.84 MIL/uL — ABNORMAL LOW (ref 4.22–5.81)
RDW: 13.4 % (ref 11.5–15.5)
WBC: 8.2 10*3/uL (ref 4.0–10.5)
nRBC: 0 % (ref 0.0–0.2)

## 2021-11-25 LAB — POC OCCULT BLOOD, ED: Fecal Occult Bld: NEGATIVE

## 2021-11-25 LAB — APTT: aPTT: 36 seconds (ref 24–36)

## 2021-11-25 LAB — PROTIME-INR
INR: 1.2 (ref 0.8–1.2)
Prothrombin Time: 15.4 seconds — ABNORMAL HIGH (ref 11.4–15.2)

## 2021-11-25 NOTE — Discharge Instructions (Addendum)
Note your work-up today was overall reassuring.  I have sent in an ambulatory referral to gastroenterology to make an appointment as soon as possible.  Consider the bland diet as attached to your discharge papers that includes easier digested foods.  Close follow-up with your primary care also recommended for reevaluation in 2 to 3 days.  Please do not hesitate to return to the emergency department if the worrisome signs and symptoms we discussed become apparent.

## 2021-11-25 NOTE — ED Provider Notes (Signed)
[facility] EMERGENCY DEPARTMENT Provider Note   CSN: [id number] Arrival date & time: 11/25/21  1133     History  Chief Complaint  Patient presents with   Rectal Bleeding    Micheal Bean is a 36 y.o. male.   Rectal Bleeding  36 year old male presents emergency department with complaints of diarrhea x5 days with rectal bleeding x1 day.  Patient is a stage V CKD who is on at home peritoneal dialysis.  He states he uses his heparin with his PD.  He notes black tarry stool this morning with streaks of bright red blood.  Bowel movements have been not painful.  He contacted his nephrologist who was concerned about potentially low hemoglobin was told to come to the emergency department.  He notes associated symptoms including fatigue and shortness of breath and generalized weakness.  He also notes taking Pepto-Bismol for the past 4 to 5 days for diarrheal type symptoms.  Denies chest pain, abdominal pain, nausea, vomiting.  Past medical history significant for anemia, blood transfusion, ESRD, seizure    Home Medications Prior to Admission medications   Medication Sig Start Date End Date Taking? Authorizing Provider  calcitRIOL (ROCALTROL) 0.25 MCG capsule Take 0.25 mcg by mouth daily. 08/26/21  Yes [provider]  multivitamin (RENA-VIT) TABS tablet TAKE 1 TABLET BY MOUTH EVERYDAY AT BEDTIME Patient taking differently: Take 1 tablet by mouth daily. 06/08/21  Yes [name], MD  SENSIPAR 30 MG tablet Take 60 mg by mouth daily. 12/25/20  Yes [provider]  sevelamer carbonate (RENVELA) 800 MG tablet Take 1,600 mg by mouth 3 (three) times daily. 12/25/20  Yes [provider]  oxyCODONE (ROXICODONE) 5 MG immediate release tablet Take 1 tablet (5 mg total) by mouth every 6 (six) hours as needed for severe pain or breakthrough pain (POSTOPERATIVE PAIN). Patient not taking: Reported on 11/25/2021 10/29/21   Georgeanna Harrison, MD  promethazine  (PHENERGAN) 12.5 MG tablet Take 1 tablet (12.5 mg total) by mouth every 6 (six) hours as needed for nausea or vomiting. Patient not taking: Reported on 11/25/2021 10/29/21   Georgeanna Harrison, MD      Allergies    Patient has no known allergies.    Review of Systems   Review of Systems  Gastrointestinal:  Positive for hematochezia.  All other systems reviewed and are negative.   Physical Exam Updated Vital Signs BP 121/79   Pulse 82   Temp 98.2 F (36.8 C) (Oral)   Resp 10   Ht 6\' 3"  (1.905 m)   Wt 81.6 kg   SpO2 100%   BMI 22.50 kg/m  Physical Exam Vitals and nursing note reviewed.  Constitutional:      General: He is not in acute distress.    Appearance: He is well-developed.  HENT:     Head: Normocephalic and atraumatic.  Eyes:     Conjunctiva/sclera: Conjunctivae normal.  Cardiovascular:     Rate and Rhythm: Normal rate and regular rhythm.     Heart sounds: No murmur heard. Pulmonary:     Effort: Pulmonary effort is normal. No respiratory distress.     Breath sounds: Normal breath sounds. No wheezing or rales.  Abdominal:     Palpations: Abdomen is soft.     Tenderness: There is no abdominal tenderness. There is no right CVA tenderness or left CVA tenderness.     Comments: Peritoneal dialysis port noted on patient's anterior abdomen.  No evidence of erythema, fluctuance or drainage noted  from site.  Genitourinary:    Comments: No evidence of external or internal hemorrhoids or fissure.  No active bleeding noted on exam.  Stool sample was obtained and sent for presence of blood via Hemoccult. Musculoskeletal:        General: No swelling.     Cervical back: Neck supple.     Right lower leg: No edema.     Left lower leg: No edema.  Skin:    General: Skin is warm and dry.     Capillary Refill: Capillary refill takes less than 2 seconds.  Neurological:     Mental Status: He is alert.  Psychiatric:        Mood and Affect: Mood normal.     ED Results / Procedures  / Treatments   Labs (all labs ordered are listed, but only abnormal results are displayed) Labs Reviewed  COMPREHENSIVE METABOLIC PANEL - Abnormal; Notable for the following components:      Result Value   BUN 43 (*)    Creatinine, Ser 15.34 (*)    Calcium 8.4 (*)    Albumin 3.1 (*)    Alkaline Phosphatase 162 (*)    GFR, Estimated 4 (*)    All other components within normal limits  CBC - Abnormal; Notable for the following components:   RBC 2.84 (*)    Hemoglobin 8.1 (*)    HCT 27.7 (*)    MCHC 29.2 (*)    Platelets 776 (*)    All other components within normal limits  PROTIME-INR - Abnormal; Notable for the following components:   Prothrombin Time 15.4 (*)    All other components within normal limits  APTT  POC OCCULT BLOOD, ED  TYPE AND SCREEN    EKG EKG Interpretation  Date/Time:  Wednesday November 25 2021 12:56:48 EDT Ventricular Rate:  102 PR Interval:  163 QRS Duration: 76 QT Interval:  339 QTC Calculation: 442 R Axis:   89 Text Interpretation: Sinus tachycardia Anteroseptal infarct, old Minimal ST elevation, inferior leads Confirmed by Margaretmary Eddy [phone]) on 11/25/2021 2:23:39 PM  Radiology No results found.  Procedures Procedures    Medications Ordered in ED Medications - No data to display  ED Course/ Medical Decision Making/ A&P                           Medical Decision Making Amount and/or Complexity of Data Reviewed Labs: ordered.   This patient presents to the ED for concern of rectal bleeding, this involves an extensive number of treatment options, and is a complaint that carries with it a high risk of complications and morbidity.  The differential diagnosis includes gastric ulcer, gastritis, malignancy, hemorrhoid, fissure, ulcerative colitis, infectious colitis, diverticulosis, diverticulitis, mesenteric ischemia,   Co morbidities that complicate the patient evaluation  See HPI   Additional history obtained:  Additional history  obtained from EMR External records from outside source obtained and reviewed including CBC from 10/29/2021 indicating hemoglobin 8.2.   Lab Tests:  I Ordered, and personally interpreted labs.  The pertinent results include: No leukocytosis.  Hemoglobin 8.1 which is near patient's baseline decreased hemoglobin.  Platelets increased of 776.  No electrolyte abnormality.  Creatinine 15.34, BUN of 43 and GFR of 4.  Patient has baseline CKD on peritoneal dialysis.  Liver function within normal range.  Prothrombin time 15.4, INR of 1.2.  Hemoccult negative.   Imaging Studies ordered:  N/a   Cardiac Monitoring: / EKG:  The patient was maintained on a cardiac monitor.  I personally viewed and interpreted the cardiac monitored which showed an underlying rhythm of: Sinus tachycardia   Consultations Obtained:  N/a   Problem List / ED Course / Critical interventions / Medication management  Diarrhea Reevaluation of the patient showed that the patient improved I have reviewed the patients home medicines and have made adjustments as needed   Social Determinants of Health:  Denies tobacco, illicit drug use   Test / Admission - Considered:  Diarrhea Vitals signs within normal range and stable throughout visit. Laboratory studies significant for: See above.  Imaging study was considered but deemed unnecessary due to lack of abdominal/rectal pain. Patient's perceived melena stools likely secondary to Pepto-Bismol ingestion given adequate stool sample obtained during Hemoccult analysis.  No bright red blood or active bleeding noticed on digital rectal exam with no obvious mass or fissure.  Given stable hemoglobin while emergency department as well as lack of other laboratory acute findings, patient deemed safe for discharge with close follow-up with GI outpatient.  Ambulatory referral made for gastroenterology for soonest appointment for patient's diarrhea symptoms.  Adequate oral hydration  recommended as well as continued peritoneal dialysis.  Treatment plan was discussed at length with patient and he acknowledged understanding was agreeable to said plan.  Close follow-up with primary care also recommended for patient's symptoms. Worrisome signs and symptoms were discussed with the patient, and the patient acknowledged understanding to return to the ED if noticed. Patient was stable upon discharge.         Final Clinical Impression(s) / ED Diagnoses Final diagnoses:  Diarrhea, unspecified type    Rx / DC Orders ED Discharge Orders          Ordered    Ambulatory referral to Gastroenterology        11/25/21 [place], [place], [place] 11/25/21 1528    Fransico Meadow, MD 11/25/21 2021

## 2021-11-25 NOTE — ED Provider Triage Note (Signed)
Emergency Medicine Provider Triage Evaluation Note  Micheal Bean , a 36 y.o. male  was evaluated in triage.  Pt complains of diarrhea x5 days with rectal bleeding x1 day.  Patient is a stage V CKD who is on at home peritoneal dialysis.  He states he uses his heparin with his PD.  He notes black tarry stool this morning with streaks of bright red blood.  Bowel moods have been not painful.  He contacted his nephrologist who said he had a low hemoglobin was told to come to the emergency department.  He notes associated symptoms including fatigue and shortness of breath and generalized weakness.  Denies chest pain, abdominal pain, nausea, vomiting.  Review of Systems  Positive: The above Negative:   Physical Exam  BP (!) 157/90 (BP Location: Right Arm)   Pulse (!) 119   Temp 98.2 F (36.8 C) (Oral)   Resp 17   Ht 6\' 3"  (1.905 m)   Wt 81.6 kg   SpO2 100%   BMI 22.50 kg/m  Gen:   Awake, no distress   Resp:  Normal effort  MSK:   Moves extremities without difficulty  Other:  No tenderness to palpation of abdomen.  Medical Decision Making  Medically screening exam initiated at 12:11 PM.  Appropriate orders placed.  JAXXSON CAVANAH was informed that the remainder of the evaluation will be completed by another provider, this initial triage assessment does not replace that evaluation, and the importance of remaining in the ED until their evaluation is complete.     Wilnette Kales, [place] 11/25/21 1216

## 2021-11-25 NOTE — ED Triage Notes (Signed)
Pt arrived to ED via POV w/ c/o diarrhea x 5 days. Pt reports today the stool has changed to dark brown tarry stool. Pt is a dialysis pt who does peritoneal dialysis. Pt reports his hbg was low. Pt called his kidney doctor and his nephrologist said for him to come to the ED d/t low hgb and dehydration and concern for "internal bleeding". Pt reports he feels fatigued, shob. Pt also just had his R quad repaired and nephrologist was concerned about that causing some bleeding per pt.

## 2021-11-26 ENCOUNTER — Encounter: Payer: Self-pay | Admitting: Gastroenterology

## 2021-12-30 ENCOUNTER — Ambulatory Visit: Payer: Medicare Other | Admitting: Gastroenterology

## 2022-05-04 ENCOUNTER — Ambulatory Visit: Payer: Self-pay | Admitting: Surgery

## 2022-05-25 NOTE — Pre-Procedure Instructions (Signed)
Surgical Instructions    Your procedure is scheduled on Monday, February 26th.  Report to [facility] Main Entrance "A" at 05:30 A.M., then check in with the Admitting office.  Call this number if you have problems the morning of surgery:  [phone]  If you have any questions prior to your surgery date call [phone]: Open Monday-Friday 8am-4pm If you experience any cold or flu symptoms such as cough, fever, chills, shortness of breath, etc. between now and your scheduled surgery, please notify us at the above number.     Remember:  Do not eat after midnight the night before your surgery  You may drink clear liquids until 04:30 AM the morning of your surgery.   Clear liquids allowed are: Water, Non-Citrus Juices (without pulp), Carbonated Beverages, Clear Tea, Black Coffee Only (NO MILK, CREAM OR POWDERED CREAMER of any kind), and Gatorade.   Patient Instructions  The night before surgery:  No food after midnight. ONLY clear liquids after midnight  The day of surgery (if you do NOT have diabetes):  Drink ONE (1) Pre-Surgery Clear Ensure by 04:30 AM the morning of surgery. Drink in one sitting. Do not sip.  This drink was given to you during your hospital  pre-op appointment visit.  Nothing else to drink after completing the  Pre-Surgery Clear Ensure.          If you have questions, please contact your surgeon's office.     Take these medicines the morning of surgery with A SIP OF WATER  sevelamer carbonate (RENVELA)    As of today, STOP taking any Aspirin (unless otherwise instructed by your surgeon) Aleve, Naproxen, Ibuprofen, Motrin, Advil, Goody's, BC's, all herbal medications, fish oil, and all vitamins.                     Do NOT Smoke (Tobacco/Vaping) for 24 hours prior to your procedure.  If you use a CPAP at night, you may bring your mask/headgear for your overnight stay.   Contacts, glasses, piercing's, hearing aid's, dentures or partials may not be worn  into surgery, please bring cases for these belongings.    For patients admitted to the hospital, discharge time will be determined by your treatment team.   Patients discharged the day of surgery will not be allowed to drive home, and someone needs to stay with them for 24 hours.  SURGICAL WAITING ROOM VISITATION Patients having surgery or a procedure may have no more than 2 support people in the waiting area - these visitors may rotate.   Children under the age of 30 must have an adult with them who is not the patient. If the patient needs to stay at the hospital during part of their recovery, the visitor guidelines for inpatient rooms apply. Pre-op nurse will coordinate an appropriate time for 1 support person to accompany patient in pre-op.  This support person may not rotate.   Please refer to the [facility] website for the visitor guidelines for Inpatients (after your surgery is over and you are in a regular room).    Special instructions:   [place]- Preparing For Surgery  Before surgery, you can play an important role. Because skin is not sterile, your skin needs to be as free of germs as possible. You can reduce the number of germs on your skin by washing with CHG (chlorahexidine gluconate) Soap before surgery.  CHG is an antiseptic cleaner which kills germs and bonds with the skin to continue killing  germs even after washing.    Oral Hygiene is also important to reduce your risk of infection.  Remember - BRUSH YOUR TEETH THE MORNING OF SURGERY WITH YOUR REGULAR TOOTHPASTE  Please do not use if you have an allergy to CHG or antibacterial soaps. If your skin becomes reddened/irritated stop using the CHG.  Do not shave (including legs and underarms) for at least 48 hours prior to first CHG shower. It is OK to shave your face.  Please follow these instructions carefully.   Shower the NIGHT BEFORE SURGERY and the MORNING OF SURGERY  If you chose to wash your hair, wash your hair  first as usual with your normal shampoo.  After you shampoo, rinse your hair and body thoroughly to remove the shampoo.  Use CHG Soap as you would any other liquid soap. You can apply CHG directly to the skin and wash gently with a scrungie or a clean washcloth.   Apply the CHG Soap to your body ONLY FROM THE NECK DOWN.  Do not use on open wounds or open sores. Avoid contact with your eyes, ears, mouth and genitals (private parts). Wash Face and genitals (private parts)  with your normal soap.   Wash thoroughly, paying special attention to the area where your surgery will be performed.  Thoroughly rinse your body with warm water from the neck down.  DO NOT shower/wash with your normal soap after using and rinsing off the CHG Soap.  Pat yourself dry with a CLEAN TOWEL.  Wear CLEAN PAJAMAS to bed the night before surgery  Place CLEAN SHEETS on your bed the night before your surgery  DO NOT SLEEP WITH PETS.   Day of Surgery: Take a shower with CHG soap. Do not wear jewelry  Do not wear lotions, powders, colognes, or deodorant. Men may shave face and neck. Do not bring valuables to the hospital. [facility] is not responsible for any belongings or valuables.  Wear Clean/Comfortable clothing the morning of surgery Remember to brush your teeth WITH YOUR REGULAR TOOTHPASTE.   Please read over the following fact sheets that you were given.    If you received a COVID test during your pre-op visit  it is requested that you wear a mask when out in public, stay away from anyone that may not be feeling well and notify your surgeon if you develop symptoms. If you have been in contact with anyone that has tested positive in the last 10 days please notify you surgeon.

## 2022-05-26 ENCOUNTER — Encounter (HOSPITAL_COMMUNITY): Payer: Self-pay

## 2022-05-26 ENCOUNTER — Encounter (HOSPITAL_COMMUNITY)
Admission: RE | Admit: 2022-05-26 | Discharge: 2022-05-26 | Disposition: A | Discharge status: Home / Self Care | Payer: Medicare Other | Source: Ambulatory Visit | Attending: Surgery | Admitting: Surgery

## 2022-05-26 ENCOUNTER — Other Ambulatory Visit: Payer: Self-pay

## 2022-05-26 VITALS — BP 153/99 | HR 79 | Temp 98.0°F | Resp 17 | Ht 74.0 in | Wt 165.0 lb

## 2022-05-26 DIAGNOSIS — Z01812 Encounter for preprocedural laboratory examination: Secondary | ICD-10-CM | POA: Diagnosis not present

## 2022-05-26 DIAGNOSIS — N170 Acute kidney failure with tubular necrosis: Secondary | ICD-10-CM

## 2022-05-26 DIAGNOSIS — Z905 Acquired absence of kidney: Secondary | ICD-10-CM | POA: Diagnosis not present

## 2022-05-26 DIAGNOSIS — N186 End stage renal disease: Secondary | ICD-10-CM | POA: Insufficient documentation

## 2022-05-26 DIAGNOSIS — Z992 Dependence on renal dialysis: Secondary | ICD-10-CM | POA: Insufficient documentation

## 2022-05-26 DIAGNOSIS — N2581 Secondary hyperparathyroidism of renal origin: Secondary | ICD-10-CM | POA: Diagnosis not present

## 2022-05-26 DIAGNOSIS — E875 Hyperkalemia: Secondary | ICD-10-CM

## 2022-05-26 LAB — CBC
HCT: 36.6 % — ABNORMAL LOW (ref 39.0–52.0)
Hemoglobin: 11.3 g/dL — ABNORMAL LOW (ref 13.0–17.0)
MCH: 29.5 pg (ref 26.0–34.0)
MCHC: 30.9 g/dL (ref 30.0–36.0)
MCV: 95.6 fL (ref 80.0–100.0)
Platelets: 302 10*3/uL (ref 150–400)
RBC: 3.83 MIL/uL — ABNORMAL LOW (ref 4.22–5.81)
RDW: 15 % (ref 11.5–15.5)
WBC: 6 10*3/uL (ref 4.0–10.5)
nRBC: 0 % (ref 0.0–0.2)

## 2022-05-26 LAB — BASIC METABOLIC PANEL
Anion gap: 18 — ABNORMAL HIGH (ref 5–15)
BUN: 95 mg/dL — ABNORMAL HIGH (ref 6–20)
CO2: 18 mmol/L — ABNORMAL LOW (ref 22–32)
Calcium: 9.3 mg/dL (ref 8.9–10.3)
Chloride: 103 mmol/L (ref 98–111)
Creatinine, Ser: 22.12 mg/dL — ABNORMAL HIGH (ref 0.61–1.24)
GFR, Estimated: 2 mL/min — ABNORMAL LOW (ref >60)
Glucose, Bld: 89 mg/dL (ref 70–99)
Potassium: 6.2 mmol/L — ABNORMAL HIGH (ref 3.5–5.1)
Sodium: 139 mmol/L (ref 135–145)

## 2022-05-26 NOTE — Progress Notes (Addendum)
PCP - Was seeing [name], she has since retired. No new PCP as of yet. Cardiologist - Denies  PPM/ICD - Denies  Chest x-ray - NI EKG - 11/25/21 Stress Test - 07/23/21 ECHO - 07/23/21 Cardiac Cath - Denies  Sleep Study - Denies  DM - Denies  Blood Thinner Instructions: N/A Aspirin Instructions:N/A  ERAS Protcol -Yes PRE-SURGERY Ensure given   COVID TEST- N/I   Anesthesia review: Yes K 6.2   Patient denies shortness of breath, fever, cough and chest pain at PAT appointment   All instructions explained to the patient, with a verbal understanding of the material. Patient agrees to go over the instructions while at home for a better understanding.  The opportunity to ask questions was provided.

## 2022-05-27 NOTE — Anesthesia Preprocedure Evaluation (Addendum)
Anesthesia Evaluation  Patient identified by MRN, date of birth, ID band Patient awake    Reviewed: Allergy & Precautions, NPO status , Patient's Chart, lab work & pertinent test results  History of Anesthesia Complications Negative for: history of anesthetic complications  Airway Mallampati: II  TM Distance: >3 FB Neck ROM: Full    Dental  (+) Poor Dentition, Dental Advisory Given   Pulmonary neg pulmonary ROS   Pulmonary exam normal        Cardiovascular Normal cardiovascular exam  07/2021 stress ECHO: EF 60% baseline, 80% stress, normal regional wall motion, throughout     Neuro/Psych    GI/Hepatic negative GI ROS, Neg liver ROS,,,  Endo/Other  negative endocrine ROS    Renal/GU ESRF and DialysisRenal disease (peritoneal dialysis at home)Home PD     Musculoskeletal   Abdominal   Peds  Hematology  (+) Blood dyscrasia (Hb 8.2), anemia   Anesthesia Other Findings   Reproductive/Obstetrics                              Anesthesia Physical Anesthesia Plan  ASA: 3  Anesthesia Plan: General   Post-op Pain Management: Tylenol PO (pre-op)*   Induction: Intravenous  PONV Risk Score and Plan: 3 and Ondansetron, Dexamethasone and Midazolam  Airway Management Planned: LMA  Additional Equipment: None  Intra-op Plan:   Post-operative Plan: Extubation in OR  Informed Consent: I have reviewed the patients History and Physical, chart, labs and discussed the procedure including the risks, benefits and alternatives for the proposed anesthesia with the patient or authorized representative who has indicated his/her understanding and acceptance.     Dental advisory given  Plan Discussed with: Anesthesiologist and CRNA  Anesthesia Plan Comments: (PAT note written 05/27/2022 by Myra Gianotti, PA-C.  )         Anesthesia Quick Evaluation

## 2022-05-27 NOTE — Progress Notes (Signed)
Anesthesia Chart Review:  Case: [id number] Date/Time: 05/31/22 0715   Procedure: TOTAL PARATHYROIDECTOMY WITH AUTOTRANSPLANT TO LEFT FOREARM (Left)   Anesthesia type: General   Pre-op diagnosis: SECONDARY HYPERPARATHYROIDISM   Location: [facility] OR ROOM 02 / [place] [facility]   Surgeons: Armandina Gemma, MD       DISCUSSION: Patient is a 37 year old male scheduled for the above procedure.  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   Preoperative labs showed K 6.2 (no mention of hemolysis). I called to speak with Micheal Bean. He thought he had issues with chronic hyperkalemia that had been treated with a medication but stopped due to elevated calcium levels; However, after speak with nurse [name] at [facility], she indicated that it was actually [name] for hyperparathyroidism that had been recently held. She indicated his last K on 05/11/22 was 5.0 and historically had not had issues with hyperkalemia. She reviewed with Dr. Joelyn Oms who recommended he be compliant with peritoneal dialysis schedule and repeat potassium on the morning of surgery. [name] will contact Micheal Bean. Of note, he told me that Dr. Joelyn Oms had instructed him not to do PD the night before surgery. He reported that he is normally on PD for ~ 14 hours.   ISTAT day of surgery ordered. Anesthesia team to evaluate on the day of surgery.   VS: BP (!) 153/99   Pulse 79   Temp 36.7 C   Resp 17   Ht 6' 2"$  (1.88 m)   Wt 74.8 kg   SpO2 100%   BMI 21.18 kg/m    PROVIDERS: [name], MD is PCP, recently retired. Pearson Grippe, MD is  is nephrologist (sees him at the [facility] location, [phone])   LABS: Labs reviewed: Repeat potassium/iSTAT on the day of surgery.  (all labs ordered are listed, but only abnormal results are displayed)  Labs Reviewed  BASIC METABOLIC PANEL - Abnormal; Notable for the following components:      Result Value   Potassium 6.2 (*)    CO2 18 (*)    BUN 95 (*)    Creatinine, Ser 22.12 (*)    GFR, Estimated 2 (*)    Anion gap 18 (*)    All other components within normal limits  CBC - Abnormal; Notable for the following components:   RBC 3.83 (*)    Hemoglobin 11.3 (*)    HCT 36.6 (*)    All other components within normal limits    EKG: 11/25/21: Sinus tachycardia at 102 bpm Anteroseptal infarct, old Minimal ST elevation, inferior leads Confirmed by Margaretmary Eddy [phone]) on 11/25/2021 2:23:39 PM  CV: Stress echo 07/23/21:  1. Functional capacity is average.   2. Hypertensive response to exercise.   3. This is a negative stress echocardiogram for ischemia.   4. This is a low risk study.      Past Medical History:  Diagnosis Date   Anemia    ESRD (end stage renal disease) ([place])    PD at home   History of blood transfusion 11/2020   4 units   History of blood transfusion    Seizure ([place])    x 1 in [place]-  02/2021    Past Surgical History:  Procedure Laterality Date   AV FISTULA PLACEMENT Right 03/02/2021   Procedure: RIGHT ARM Radio-Cephalic  ARTERIOVENOUS (AV) FISTULA CREATION;  Surgeon: [name], MD;  Location: [facility];  Service: Vascular;  Laterality: Right;   CAPD INSERTION N/A 05/20/2021   Procedure: LAPAROSCOPIC INSERTION CONTINUOUS AMBULATORY PERITONEAL DIALYSIS  CATHETER WITH POSSIBLE OMENTOPEXY;  Surgeon: Cherre Robins, MD;  Location: [facility] OR;  Service: Vascular;  Laterality: N/A;   IR FLUORO GUIDE CV LINE RIGHT  11/18/2020   IR US GUIDE VASC ACCESS RIGHT  11/18/2020   NEPHRECTOMY     childhhod, RT side   QUADRICEPS TENDON REPAIR Right 10/29/2021   Procedure: RIGHT QUADRICEP  REPAIR AND PRIMARY REPAIR ANTERIOR CAPSULE;  Surgeon: Georgeanna Harrison, MD;  Location: [facility];  Service: Orthopedics;  Laterality: Right;    MEDICATIONS:  calcium acetate (PHOSLO) 667 MG capsule   calcitRIOL (ROCALTROL) 0.25 MCG capsule   cinacalcet (SENSIPAR) 60 MG tablet   gentamicin cream (GARAMYCIN) 0.1 %   multivitamin (RENA-VIT) TABS tablet   sevelamer carbonate (RENVELA) 800 MG tablet   No current facility-administered medications for this encounter.  He is not currently taking Sensipar (per Dr. Joelyn Oms).   Myra Gianotti, PA-C Surgical Short Stay/Anesthesiology [facility] Phone [phone] [facility] Phone [phone] 05/27/2022 3:43 PM

## 2022-05-29 ENCOUNTER — Encounter (HOSPITAL_COMMUNITY): Payer: Self-pay | Admitting: Surgery

## 2022-05-29 DIAGNOSIS — N2581 Secondary hyperparathyroidism of renal origin: Secondary | ICD-10-CM | POA: Diagnosis present

## 2022-05-29 NOTE — H&P (Signed)
REFERRING PHYSICIAN: Pearson Grippe Bobbitt  PROVIDER: Bolton Canupp Charlotta Newton, MD   Chief Complaint: New Consultation (Secondary hyperparathyroidism, ESRD)  History of Present Illness:  Patient is referred by Dr. Pearson Grippe for surgical evaluation and management of secondary hyperparathyroidism of renal origin. Patient has end-stage renal disease of undetermined etiology. It may have been related to COVID. Patient was started on hemodialysis approximately 2 years ago with a fistula in the right forearm. He converted to peritoneal dialysis 1 year ago. Patient has developed biochemical evidence of secondary hyperparathyroidism. This has not responded well to Sensipar administration. His most recent levels from April 20, 2022 show a intact PTH level of 3034, a calcium level of 7.4, and a phosphorus level of 8.4. Phosphorus level has been as high as 11.3 and intact PTH level has been greater than 3200. Patient presents today accompanied by his mother to discuss surgery for management of secondary hyperparathyroidism. He has had no prior surgery on the neck. He is not on anticoagulation other than the heparin that is in his peritoneal dialysate.  Review of Systems: A complete review of systems was obtained from the patient. I have reviewed this information and discussed as appropriate with the patient. See HPI as well for other ROS.  Review of Systems Constitutional: Negative. HENT: Negative. Eyes: Negative. Respiratory: Negative. Cardiovascular: Negative. Gastrointestinal: Negative. Genitourinary: Negative. Musculoskeletal: Negative. Skin: Negative. Neurological: Negative. Endo/Heme/Allergies: Negative. Psychiatric/Behavioral: Negative.   Medical History: Past Medical History: Diagnosis Date Chronic kidney disease  Patient Active Problem List Diagnosis Acute lower UTI Acute renal failure (ARF) (CMS-HCC) AKI (acute kidney injury) (CMS-HCC) Leukocytosis Osteomyelitis  (CMS-HCC) Bone lesion Pre-transplant evaluation for kidney transplant ESRD on hemodialysis (CMS-HCC) Secondary hyperparathyroidism of renal origin (CMS-HCC)  History reviewed. No pertinent surgical history.  No Known Allergies  Current Outpatient Medications on File Prior to Visit Medication Sig Dispense Refill b complex multivitamin (RENA-VITE) 0.8 mg Take 1 tablet by mouth at bedtime cinacalcet (SENSIPAR) 30 MG tablet TAKE 2 TABLET BY MOUTH ONCE A DAY TAKE WITH LARGEST MEAL OF THE DAY cinacalcet (SENSIPAR) 30 MG tablet Take by mouth cyanocobalamin (VITAMIN B12) 1000 MCG tablet Take by mouth cyanocobalamin (VITAMIN B12) 1000 MCG tablet Take 1,000 mcg by mouth once daily lanthanum (FOSRENOL) 1000 MG chewable tablet Take by mouth (Patient not taking: Reported on 04/10/2021) RENA-VITE 0.8 mg Take 1 tablet by mouth at bedtime sevelamer carbonate (RENVELA) 800 mg tablet Take by mouth sildenafiL (VIAGRA) 100 MG tablet Take by mouth  No current facility-administered medications on file prior to visit.  History reviewed. No pertinent family history.  Social History  Tobacco Use Smoking Status Never Smokeless Tobacco Never   Social History  Socioeconomic History Marital status: Unknown Tobacco Use Smoking status: Never Smokeless tobacco: Never Substance and Sexual Activity Alcohol use: Not Currently Alcohol/week: 5.0 standard drinks of alcohol Types: 5 Shots of liquor per week Drug use: Not Currently Frequency: 7.0 times per week Types: Marijuana Sexual activity: Yes Partners: Female Birth control/protection: Condom  Social Determinants of Health  Food Insecurity: Food Insecurity Present (04/10/2021) Hunger Vital Sign Worried About Running Out of Food in the Last Year: Never true Ran Out of Food in the Last Year: Sometimes true Transportation Needs: No Transportation Needs (04/10/2021) PRAPARE - Control and instrumentation engineer (Medical): No Lack of Transportation  (Non-Medical): No  Objective:  Vitals: BP: 138/76 Pulse: 86 Temp: 36.7 C (98 F) SpO2: (!) 90% Weight: 75.1 kg (165 lb 9.6 oz) Height: 190.5 cm ('6\' 3"'$ )  Body  mass index is 20.7 kg/m.  Physical Exam  GENERAL APPEARANCE Comfortable, no acute issues Development: normal Gross deformities: none  SKIN Rash, lesions, ulcers: none Induration, erythema: none Nodules: none palpable  EYES Conjunctiva and lids: normal Pupils: equal and reactive  EARS, NOSE, MOUTH, THROAT External ears: no lesion or deformity External nose: no lesion or deformity Hearing: grossly normal  NECK Symmetric: yes Trachea: midline Thyroid: no palpable nodules in the thyroid bed  CHEST Respiratory effort: normal Retraction or accessory muscle use: no Breath sounds: normal bilaterally Rales, rhonchi, wheeze: none  CARDIOVASCULAR Auscultation: regular rhythm, normal rate Murmurs: none Pulses: radial pulse 2+ palpable Lower extremity edema: none  ABDOMEN Not assessed  GENITOURINARY/RECTAL Not assessed  MUSCULOSKELETAL Station and gait: normal Digits and nails: no clubbing or cyanosis Muscle strength: grossly normal all extremities Range of motion: grossly normal all extremities Deformity: none  LYMPHATIC Cervical: none palpable Supraclavicular: none palpable  PSYCHIATRIC Oriented to person, place, and time: yes Mood and affect: normal for situation Judgment and insight: appropriate for situation   Assessment and Plan:  Secondary hyperparathyroidism of renal origin (CMS-HCC)  Patient is referred by his nephrologist, Dr. Pearson Grippe, for surgical evaluation and management of secondary hyperparathyroidism of renal origin. Patient is accompanied by his mother. Patient is provided with written literature on parathyroid disease to review at home.  Today we reviewed his clinical history. We reviewed his laboratory studies. We discussed the indications for parathyroidectomy. We  discussed the risk and benefits of surgery including the risk of recurrent laryngeal nerve injury. We discussed the potential for recurrent secondary hyperparathyroidism requiring additional surgery. We discussed the rationale behind the procedure. I have recommended total parathyroidectomy with autotransplantation to the left forearm. We discussed the size and location of the surgical incisions. We discussed the hospital stay to be anticipated. We discussed his postoperative recovery. The patient and his mother understand and agree to proceed with surgery in the near future.  Armandina Gemma, MD [facility] [place] practice Office: [phone]

## 2022-05-31 ENCOUNTER — Other Ambulatory Visit: Payer: Self-pay

## 2022-05-31 ENCOUNTER — Inpatient Hospital Stay (HOSPITAL_COMMUNITY): Payer: Medicare Other | Admitting: Physician Assistant

## 2022-05-31 ENCOUNTER — Encounter (HOSPITAL_COMMUNITY)
Admission: RE | Disposition: A | Discharge status: Home / Self Care | Payer: Self-pay | Source: Home / Self Care | Attending: Surgery

## 2022-05-31 ENCOUNTER — Inpatient Hospital Stay (HOSPITAL_COMMUNITY): Payer: Medicare Other | Admitting: Anesthesiology

## 2022-05-31 ENCOUNTER — Inpatient Hospital Stay (HOSPITAL_COMMUNITY)
Admission: RE | Admit: 2022-05-31 | Discharge: 2022-06-04 | DRG: 675 | Disposition: A | Discharge status: Home / Self Care | Payer: Medicare Other | Attending: Surgery | Admitting: Surgery

## 2022-05-31 ENCOUNTER — Encounter (HOSPITAL_COMMUNITY): Payer: Self-pay | Admitting: Surgery

## 2022-05-31 DIAGNOSIS — Z905 Acquired absence of kidney: Secondary | ICD-10-CM | POA: Diagnosis not present

## 2022-05-31 DIAGNOSIS — D631 Anemia in chronic kidney disease: Secondary | ICD-10-CM | POA: Diagnosis present

## 2022-05-31 DIAGNOSIS — R839 Unspecified abnormal finding in cerebrospinal fluid: Secondary | ICD-10-CM | POA: Diagnosis present

## 2022-05-31 DIAGNOSIS — U099 Post covid-19 condition, unspecified: Secondary | ICD-10-CM | POA: Diagnosis present

## 2022-05-31 DIAGNOSIS — Z79899 Other long term (current) drug therapy: Secondary | ICD-10-CM | POA: Diagnosis not present

## 2022-05-31 DIAGNOSIS — Z8249 Family history of ischemic heart disease and other diseases of the circulatory system: Secondary | ICD-10-CM

## 2022-05-31 DIAGNOSIS — N186 End stage renal disease: Secondary | ICD-10-CM | POA: Diagnosis present

## 2022-05-31 DIAGNOSIS — N2581 Secondary hyperparathyroidism of renal origin: Secondary | ICD-10-CM

## 2022-05-31 DIAGNOSIS — I10 Essential (primary) hypertension: Secondary | ICD-10-CM | POA: Diagnosis present

## 2022-05-31 DIAGNOSIS — Z992 Dependence on renal dialysis: Secondary | ICD-10-CM | POA: Diagnosis present

## 2022-05-31 DIAGNOSIS — E875 Hyperkalemia: Secondary | ICD-10-CM

## 2022-05-31 HISTORY — PX: PARATHYROIDECTOMY: SHX19

## 2022-05-31 LAB — RENAL FUNCTION PANEL
Albumin: 2.4 g/dL — ABNORMAL LOW (ref 3.5–5.0)
Albumin: 2.7 g/dL — ABNORMAL LOW (ref 3.5–5.0)
Anion gap: 20 — ABNORMAL HIGH (ref 5–15)
Anion gap: 17 — ABNORMAL HIGH (ref 5–15)
BUN: 79 mg/dL — ABNORMAL HIGH (ref 6–20)
BUN: 93 mg/dL — ABNORMAL HIGH (ref 6–20)
CO2: 14 mmol/L — ABNORMAL LOW (ref 22–32)
CO2: 16 mmol/L — ABNORMAL LOW (ref 22–32)
Calcium: 7.9 mg/dL — ABNORMAL LOW (ref 8.9–10.3)
Calcium: 7.2 mg/dL — ABNORMAL LOW (ref 8.9–10.3)
Chloride: 100 mmol/L (ref 98–111)
Chloride: 98 mmol/L (ref 98–111)
Creatinine, Ser: 15.92 mg/dL — ABNORMAL HIGH (ref 0.61–1.24)
Creatinine, Ser: 18.94 mg/dL — ABNORMAL HIGH (ref 0.61–1.24)
GFR, Estimated: 4 mL/min — ABNORMAL LOW (ref >60)
GFR, Estimated: 3 mL/min — ABNORMAL LOW (ref >60)
Glucose, Bld: 134 mg/dL — ABNORMAL HIGH (ref 70–99)
Glucose, Bld: 144 mg/dL — ABNORMAL HIGH (ref 70–99)
Phosphorus: 8.9 mg/dL — ABNORMAL HIGH (ref 2.5–4.6)
Phosphorus: 9.4 mg/dL — ABNORMAL HIGH (ref 2.5–4.6)
Potassium: 4.5 mmol/L (ref 3.5–5.1)
Potassium: 5.8 mmol/L — ABNORMAL HIGH (ref 3.5–5.1)
Sodium: 134 mmol/L — ABNORMAL LOW (ref 135–145)
Sodium: 131 mmol/L — ABNORMAL LOW (ref 135–145)

## 2022-05-31 LAB — POCT I-STAT, CHEM 8
BUN: 100 mg/dL — ABNORMAL HIGH (ref 6–20)
Calcium, Ion: 1.19 mmol/L (ref 1.15–1.40)
Chloride: 104 mmol/L (ref 98–111)
Creatinine, Ser: >18 mg/dL — ABNORMAL HIGH (ref 0.61–1.24)
Glucose, Bld: 167 mg/dL — ABNORMAL HIGH (ref 70–99)
HCT: 28 % — ABNORMAL LOW (ref 39.0–52.0)
Hemoglobin: 9.5 g/dL — ABNORMAL LOW (ref 13.0–17.0)
Potassium: 4.3 mmol/L (ref 3.5–5.1)
Sodium: 137 mmol/L (ref 135–145)
TCO2: 19 mmol/L — ABNORMAL LOW (ref 22–32)

## 2022-05-31 SURGERY — PARATHYROIDECTOMY
Anesthesia: General | Site: Neck | Laterality: Left

## 2022-05-31 MED ORDER — 0.9 % SODIUM CHLORIDE (POUR BTL) OPTIME
TOPICAL | Status: DC | PRN
  Administered 2022-05-31: 1000 mL

## 2022-05-31 MED ORDER — CEFAZOLIN SODIUM-DEXTROSE 2-4 GM/100ML-% IV SOLN
2.0000 g | INTRAVENOUS | Status: AC
  Administered 2022-05-31: 2 g via INTRAVENOUS
  Filled 2022-05-31: qty 100

## 2022-05-31 MED ORDER — FENTANYL CITRATE (PF) 250 MCG/5ML IJ SOLN
INTRAMUSCULAR | Status: AC
  Filled 2022-05-31: qty 5

## 2022-05-31 MED ORDER — MIDAZOLAM HCL 2 MG/2ML IJ SOLN
INTRAMUSCULAR | Status: AC
  Filled 2022-05-31: qty 2

## 2022-05-31 MED ORDER — CHLORHEXIDINE GLUCONATE CLOTH 2 % EX PADS: 6.0000 | MEDICATED_PAD | Freq: Once | CUTANEOUS | Status: DC

## 2022-05-31 MED ORDER — CHLORHEXIDINE GLUCONATE 0.12 % MT SOLN
15.0000 mL | Freq: Once | OROMUCOSAL | Status: AC
  Administered 2022-05-31: 15 mL via OROMUCOSAL
  Filled 2022-05-31: qty 15

## 2022-05-31 MED ORDER — DELFLEX-LC/2.5% DEXTROSE 394 MOSM/L IP SOLN: INTRAPERITONEAL | Status: DC

## 2022-05-31 MED ORDER — LABETALOL HCL 5 MG/ML IV SOLN
INTRAVENOUS | Status: DC | PRN
  Administered 2022-05-31: 5 mg via INTRAVENOUS

## 2022-05-31 MED ORDER — HYDRALAZINE HCL 20 MG/ML IJ SOLN
INTRAMUSCULAR | Status: AC
  Filled 2022-05-31: qty 1

## 2022-05-31 MED ORDER — LIDOCAINE 2% (20 MG/ML) 5 ML SYRINGE
INTRAMUSCULAR | Status: DC | PRN
  Administered 2022-05-31: 100 mg via INTRAVENOUS

## 2022-05-31 MED ORDER — SUGAMMADEX SODIUM 200 MG/2ML IV SOLN
INTRAVENOUS | Status: DC | PRN
  Administered 2022-05-31: 299.2 mg via INTRAVENOUS

## 2022-05-31 MED ORDER — ROCURONIUM BROMIDE 10 MG/ML (PF) SYRINGE
PREFILLED_SYRINGE | INTRAVENOUS | Status: DC | PRN
  Administered 2022-05-31 (×2): 50 mg via INTRAVENOUS

## 2022-05-31 MED ORDER — ROCURONIUM BROMIDE 10 MG/ML (PF) SYRINGE
PREFILLED_SYRINGE | INTRAVENOUS | Status: AC
  Filled 2022-05-31: qty 10

## 2022-05-31 MED ORDER — OXYCODONE HCL 5 MG PO TABS: 5.0000 mg | ORAL_TABLET | ORAL | Status: DC | PRN

## 2022-05-31 MED ORDER — ONDANSETRON HCL 4 MG/2ML IJ SOLN
INTRAMUSCULAR | Status: AC
  Filled 2022-05-31: qty 2

## 2022-05-31 MED ORDER — FENTANYL CITRATE (PF) 250 MCG/5ML IJ SOLN
INTRAMUSCULAR | Status: DC | PRN
  Administered 2022-05-31 (×3): 50 ug via INTRAVENOUS
  Administered 2022-05-31: 100 ug via INTRAVENOUS

## 2022-05-31 MED ORDER — LACTATED RINGERS IV SOLN: INTRAVENOUS | Status: DC | PRN

## 2022-05-31 MED ORDER — FENTANYL CITRATE (PF) 100 MCG/2ML IJ SOLN
INTRAMUSCULAR | Status: AC
  Filled 2022-05-31: qty 2

## 2022-05-31 MED ORDER — ACETAMINOPHEN 500 MG PO TABS
1000.0000 mg | ORAL_TABLET | Freq: Once | ORAL | Status: AC
  Administered 2022-05-31: 1000 mg via ORAL
  Filled 2022-05-31: qty 2

## 2022-05-31 MED ORDER — DEXAMETHASONE SODIUM PHOSPHATE 10 MG/ML IJ SOLN
INTRAMUSCULAR | Status: AC
  Filled 2022-05-31: qty 1

## 2022-05-31 MED ORDER — DELFLEX-LC/1.5% DEXTROSE 344 MOSM/L IP SOLN: INTRAPERITONEAL | Status: DC

## 2022-05-31 MED ORDER — HYDRALAZINE HCL 20 MG/ML IJ SOLN
10.0000 mg | Freq: Once | INTRAMUSCULAR | Status: AC
  Administered 2022-05-31: 10 mg via INTRAVENOUS

## 2022-05-31 MED ORDER — HYDROMORPHONE HCL 1 MG/ML IJ SOLN
1.0000 mg | INTRAMUSCULAR | Status: DC | PRN
  Administered 2022-05-31: 1 mg via INTRAVENOUS
  Filled 2022-05-31: qty 1

## 2022-05-31 MED ORDER — HEMOSTATIC AGENTS (NO CHARGE) OPTIME
TOPICAL | Status: DC | PRN
  Administered 2022-05-31: 1 via TOPICAL

## 2022-05-31 MED ORDER — MIDAZOLAM HCL 2 MG/2ML IJ SOLN
INTRAMUSCULAR | Status: DC | PRN
  Administered 2022-05-31: 2 mg via INTRAVENOUS

## 2022-05-31 MED ORDER — ORAL CARE MOUTH RINSE: 15.0000 mL | Freq: Once | OROMUCOSAL | Status: AC

## 2022-05-31 MED ORDER — ONDANSETRON HCL 4 MG/2ML IJ SOLN
INTRAMUSCULAR | Status: DC | PRN
  Administered 2022-05-31: 4 mg via INTRAVENOUS

## 2022-05-31 MED ORDER — LACTATED RINGERS IV SOLN: INTRAVENOUS | Status: DC

## 2022-05-31 MED ORDER — SODIUM CHLORIDE 0.9 % IV SOLN: INTRAVENOUS | Status: DC

## 2022-05-31 MED ORDER — CALCITRIOL 0.5 MCG PO CAPS
1.0000 ug | ORAL_CAPSULE | Freq: Two times a day (BID) | ORAL | Status: DC
  Administered 2022-05-31 – 2022-06-02 (×5): 1 ug via ORAL
  Filled 2022-05-31 (×8): qty 2

## 2022-05-31 MED ORDER — ONDANSETRON HCL 4 MG/2ML IJ SOLN: 4.0000 mg | Freq: Four times a day (QID) | INTRAMUSCULAR | Status: DC | PRN

## 2022-05-31 MED ORDER — CALCITRIOL 0.5 MCG PO CAPS
0.5000 ug | ORAL_CAPSULE | Freq: Two times a day (BID) | ORAL | Status: DC
  Administered 2022-05-31: 0.5 ug via ORAL
  Filled 2022-05-31 (×2): qty 1

## 2022-05-31 MED ORDER — CALCIUM ACETATE (PHOS BINDER) 667 MG PO CAPS
1334.0000 mg | ORAL_CAPSULE | Freq: Three times a day (TID) | ORAL | Status: DC
  Administered 2022-06-01 – 2022-06-04 (×10): 1334 mg via ORAL
  Filled 2022-05-31 (×10): qty 2

## 2022-05-31 MED ORDER — ACETAMINOPHEN 325 MG PO TABS
650.0000 mg | ORAL_TABLET | Freq: Four times a day (QID) | ORAL | Status: DC | PRN
  Administered 2022-06-03 – 2022-06-04 (×2): 650 mg via ORAL
  Filled 2022-05-31 (×2): qty 2

## 2022-05-31 MED ORDER — CALCITRIOL 0.5 MCG PO CAPS
0.5000 ug | ORAL_CAPSULE | Freq: Every day | ORAL | Status: DC
  Filled 2022-05-31: qty 1

## 2022-05-31 MED ORDER — FENTANYL CITRATE (PF) 100 MCG/2ML IJ SOLN
25.0000 ug | INTRAMUSCULAR | Status: DC | PRN
  Administered 2022-05-31 (×3): 50 ug via INTRAVENOUS

## 2022-05-31 MED ORDER — LABETALOL HCL 5 MG/ML IV SOLN
INTRAVENOUS | Status: AC
  Filled 2022-05-31: qty 4

## 2022-05-31 MED ORDER — ACETAMINOPHEN 650 MG RE SUPP: 650.0000 mg | Freq: Four times a day (QID) | RECTAL | Status: DC | PRN

## 2022-05-31 MED ORDER — TRAMADOL HCL 50 MG PO TABS: 50.0000 mg | ORAL_TABLET | Freq: Four times a day (QID) | ORAL | Status: DC | PRN

## 2022-05-31 MED ORDER — ONDANSETRON 4 MG PO TBDP: 4.0000 mg | ORAL_TABLET | Freq: Four times a day (QID) | ORAL | Status: DC | PRN

## 2022-05-31 MED ORDER — BUPIVACAINE HCL (PF) 0.25 % IJ SOLN
INTRAMUSCULAR | Status: AC
  Filled 2022-05-31: qty 30

## 2022-05-31 MED ORDER — DEXAMETHASONE SODIUM PHOSPHATE 10 MG/ML IJ SOLN
INTRAMUSCULAR | Status: DC | PRN
  Administered 2022-05-31: 10 mg via INTRAVENOUS

## 2022-05-31 MED ORDER — PROPOFOL 500 MG/50ML IV EMUL
INTRAVENOUS | Status: DC | PRN
  Administered 2022-05-31: 75 ug/kg/min via INTRAVENOUS

## 2022-05-31 MED ORDER — GENTAMICIN SULFATE 0.1 % EX CREA
1.0000 | TOPICAL_CREAM | Freq: Every day | CUTANEOUS | Status: DC
  Administered 2022-05-31 – 2022-06-03 (×4): 1 via TOPICAL
  Filled 2022-05-31: qty 15

## 2022-05-31 MED ORDER — PROMETHAZINE HCL 25 MG/ML IJ SOLN: 6.2500 mg | INTRAMUSCULAR | Status: DC | PRN

## 2022-05-31 MED ORDER — LABETALOL HCL 5 MG/ML IV SOLN
10.0000 mg | INTRAVENOUS | Status: AC | PRN
  Administered 2022-05-31 (×2): 10 mg via INTRAVENOUS

## 2022-05-31 MED ORDER — AMISULPRIDE (ANTIEMETIC) 5 MG/2ML IV SOLN: 10.0000 mg | Freq: Once | INTRAVENOUS | Status: DC | PRN

## 2022-05-31 MED ORDER — PROPOFOL 10 MG/ML IV BOLUS
INTRAVENOUS | Status: DC | PRN
  Administered 2022-05-31: 200 mg via INTRAVENOUS

## 2022-05-31 MED ORDER — LIDOCAINE 2% (20 MG/ML) 5 ML SYRINGE
INTRAMUSCULAR | Status: AC
  Filled 2022-05-31: qty 5

## 2022-05-31 SURGICAL SUPPLY — 58 items
ADH SKN CLS APL DERMABOND .7 (GAUZE/BANDAGES/DRESSINGS) ×2
APL PRP STRL LF DISP 70% ISPRP (MISCELLANEOUS) ×1
ATTRACTOMAT 16X20 MAGNETIC DRP (DRAPES) ×2 IMPLANT
BAG COUNTER SPONGE SURGICOUNT (BAG) ×2 IMPLANT
BAG SPNG CNTER NS LX DISP (BAG) ×1
BLADE SURG 15 STRL LF DISP TIS (BLADE) ×2 IMPLANT
BLADE SURG 15 STRL SS (BLADE) ×2
CANISTER SUCT 3000ML PPV (MISCELLANEOUS) ×2 IMPLANT
CHLORAPREP W/TINT 26 (MISCELLANEOUS) ×2 IMPLANT
CLIP VESOCCLUDE MED 6/CT (CLIP) ×2 IMPLANT
CLIP VESOCCLUDE SM WIDE 6/CT (CLIP) ×2 IMPLANT
CNTNR URN SCR LID CUP LEK RST (MISCELLANEOUS) ×2 IMPLANT
CONT SPEC 4OZ STRL OR WHT (MISCELLANEOUS) ×4
CONTAINER PROTECT SURGISLUSH (MISCELLANEOUS) ×2 IMPLANT
COVER SURGICAL LIGHT HANDLE (MISCELLANEOUS) ×2 IMPLANT
DERMABOND ADVANCED .7 DNX12 (GAUZE/BANDAGES/DRESSINGS) IMPLANT
DRAPE LAPAROTOMY 100X72 PEDS (DRAPES) ×2 IMPLANT
DRAPE UTILITY XL STRL (DRAPES) ×2 IMPLANT
ELECT CAUTERY BLADE 6.4 (BLADE) ×2 IMPLANT
ELECT REM PT RETURN 9FT ADLT (ELECTROSURGICAL) ×1
ELECTRODE REM PT RTRN 9FT ADLT (ELECTROSURGICAL) ×2 IMPLANT
GAUZE 4X4 16PLY ~~LOC~~+RFID DBL (SPONGE) ×2 IMPLANT
GAUZE SPONGE 2X2 8PLY STRL LF (GAUZE/BANDAGES/DRESSINGS) ×2 IMPLANT
GLOVE SURG ORTHO 8.0 STRL STRW (GLOVE) ×2 IMPLANT
GOWN STRL REUS W/ TWL LRG LVL3 (GOWN DISPOSABLE) ×2 IMPLANT
GOWN STRL REUS W/ TWL XL LVL3 (GOWN DISPOSABLE) ×2 IMPLANT
GOWN STRL REUS W/TWL LRG LVL3 (GOWN DISPOSABLE) ×1
GOWN STRL REUS W/TWL XL LVL3 (GOWN DISPOSABLE) ×1
HEMOSTAT ARISTA ABSORB 3G PWDR (HEMOSTASIS) IMPLANT
HEMOSTAT SURGICEL 2X4 FIBR (HEMOSTASIS) ×2 IMPLANT
ILLUMINATOR WAVEGUIDE N/F (MISCELLANEOUS) ×2 IMPLANT
KIT BASIN OR (CUSTOM PROCEDURE TRAY) ×2 IMPLANT
KIT TURNOVER KIT B (KITS) ×2 IMPLANT
NDL HYPO 25GX1X1/2 BEV (NEEDLE) ×2 IMPLANT
NEEDLE HYPO 25GX1X1/2 BEV (NEEDLE) ×1 IMPLANT
NS IRRIG 1000ML POUR BTL (IV SOLUTION) ×2 IMPLANT
PACK BASIC III (CUSTOM PROCEDURE TRAY)
PACK GENERAL/GYN (CUSTOM PROCEDURE TRAY) IMPLANT
PACK SRG BSC III STRL LF ECLPS (CUSTOM PROCEDURE TRAY) ×2 IMPLANT
PAD ARMBOARD 7.5X6 YLW CONV (MISCELLANEOUS) ×2 IMPLANT
PENCIL BUTTON HOLSTER BLD 10FT (ELECTRODE) ×2 IMPLANT
POSITIONER HEAD DONUT 9IN (MISCELLANEOUS) ×2 IMPLANT
SHEARS HARMONIC 9CM CVD (BLADE) ×2 IMPLANT
SPONGE INTESTINAL PEANUT (DISPOSABLE) IMPLANT
STRIP CLOSURE SKIN 1/2X4 (GAUZE/BANDAGES/DRESSINGS) ×2 IMPLANT
SUT MNCRL AB 4-0 PS2 18 (SUTURE) ×2 IMPLANT
SUT PROLENE 4 0 RB 1 (SUTURE) ×1
SUT PROLENE 4-0 RB1 .5 CRCL 36 (SUTURE) IMPLANT
SUT SILK 2 0 (SUTURE)
SUT SILK 2-0 18XBRD TIE 12 (SUTURE) IMPLANT
SUT SILK 3 0 (SUTURE)
SUT SILK 3-0 18XBRD TIE 12 (SUTURE) IMPLANT
SUT VIC AB 3-0 SH 18 (SUTURE) ×2 IMPLANT
SYR BULB EAR ULCER 3OZ GRN STR (SYRINGE) ×2 IMPLANT
SYR CONTROL 10ML LL (SYRINGE) ×2 IMPLANT
TOWEL GREEN STERILE (TOWEL DISPOSABLE) ×2 IMPLANT
TOWEL GREEN STERILE FF (TOWEL DISPOSABLE) ×2 IMPLANT
TUBE CONNECTING 12X1/4 (SUCTIONS) ×2 IMPLANT

## 2022-05-31 NOTE — Transfer of Care (Signed)
Immediate Anesthesia Transfer of Care Note  Patient: Micheal Bean  Procedure(s) Performed: TOTAL PARATHYROIDECTOMY WITH AUTOTRANSPLANT TO LEFT FOREARM (Left: Neck)  Patient Location: PACU  Anesthesia Type:General  Level of Consciousness: awake, alert , and oriented  Airway & Oxygen Therapy: Patient Spontanous Breathing  Post-op Assessment: Report given to RN and Post -op Vital signs reviewed and stable  Post vital signs: Reviewed and stable  Last Vitals:  Vitals Value Taken Time  BP 167/106 05/31/22 1009  Temp 98   Pulse 59 05/31/22 1009  Resp 15 05/31/22 1009  SpO2 98 % 05/31/22 1009  Vitals shown include unvalidated device data.  Last Pain:  Vitals:   05/31/22 0605  TempSrc:   PainSc: 0-No pain         Complications: No notable events documented.

## 2022-05-31 NOTE — Anesthesia Postprocedure Evaluation (Signed)
Anesthesia Post Note  Patient: Micheal Bean  Procedure(s) Performed: TOTAL PARATHYROIDECTOMY WITH AUTOTRANSPLANT TO LEFT FOREARM (Left: Neck)     Patient location during evaluation: PACU Anesthesia Type: General Level of consciousness: sedated Pain management: pain level controlled Vital Signs Assessment: post-procedure vital signs reviewed and stable Respiratory status: spontaneous breathing and respiratory function stable Cardiovascular status: stable Postop Assessment: no apparent nausea or vomiting Anesthetic complications: no   No notable events documented.  Last Vitals:  Vitals:   05/31/22 1215 05/31/22 1230  BP: (!) 168/100 (!) 158/98  Pulse: 80 80  Resp: 16 16  Temp:    SpO2: 99% 99%                 Leyla Soliz DANIEL

## 2022-05-31 NOTE — Op Note (Addendum)
Operative Note  Pre-operative Diagnosis: Secondary hyperparathyroidism due to end-stage renal disease  Post-operative Diagnosis: Same  Surgeon:  Armandina Gemma, MD  Assistant: None   Procedure: 1. neck exploration with total parathyroidectomy  2.  Autotransplantation of parathyroid tissue to the left brachio-radialis muscle  Anesthesia: General  Estimated Blood Loss: Minimal  Drains: None         Specimen: Parathyroid glands to pathology  Indications:  Patient is referred by Dr. Pearson Grippe for surgical evaluation and management of secondary hyperparathyroidism of renal origin. Patient has end-stage renal disease of undetermined etiology. It may have been related to COVID. Patient was started on hemodialysis approximately 2 years ago with a fistula in the right forearm. He converted to peritoneal dialysis 1 year ago. Patient has developed biochemical evidence of secondary hyperparathyroidism. This has not responded well to Sensipar administration. His most recent levels from April 20, 2022 show a intact PTH level of 3034, a calcium level of 7.4, and a phosphorus level of 8.4. Phosphorus level has been as high as 11.3 and intact PTH level has been greater than 3200. Patient presents today accompanied by his mother to discuss surgery for management of secondary hyperparathyroidism.   Procedure:  The patient was seen in the pre-op holding area. The risks, benefits, complications, treatment options, and expected outcomes were previously discussed with the patient. The patient agreed with the proposed plan and has signed the informed consent form.  The patient was brought to the operating room by the surgical team, identified as Micheal Bean and the procedure verified. A "time out" was completed and the above information confirmed.  Following induction of general anesthesia, the patient is positioned and then prepped and draped in usual aseptic fashion.  After ascertaining that an adequate level  of anesthesia been achieved, a Kocher incision was made with a #15 blade.  Dissection was carried through subcutaneous tissues and platysma.  Hemostasis is achieved with the electrocautery.  Skin flaps were developed cephalad and caudad.  A self-retaining retractor was placed for exposure.  Strap muscles are incised in the midline.  Strap muscles were reflected to the left exposing the left thyroid lobe.  Left lobe was gently mobilized.  Exploration reveals an enlarged left inferior parathyroid gland just below the inferior pole of the thyroid lobe.  A second enlarged parathyroid gland is identified posterior to the superior pole of the left thyroid lobe.  The superior gland is gently dissected out.  Vascular structures are divided between small ligaclips and the gland is excised using the harmonic scalpel for hemostasis.  The gland is sectioned on the back table and a small fragment is submitted to pathology for frozen section.  This confirms hypercellular parathyroid tissue.  The remainder of the gland is placed in iced saline on the back table.  The inferior gland is dissected out using the harmonic scalpel for hemostasis.  Vascular pedicle was divided between small ligaclips.  The gland is completely excised.  A fragment of the gland is excised and submitted for frozen section confirming hypercellular parathyroid tissue.  The remainder of the gland is placed in iced saline on the back table.  Next we turned our attention to the right thyroid lobe.  It is gently mobilized.  The superior parathyroid gland is identified posterior to the superior pole of the thyroid.  It is dissected out using the harmonic scalpel for hemostasis.  Vascular pedicles divided between large ligaclips and the gland is excised.  Frozen section biopsy confirms hypercellular  parathyroid tissue.  The remainder of the gland is placed in iced saline on the back table.  Additional dissection on the right reveals an enlarged inferior  parathyroid gland located just below the level of the inferior thyroid artery.  It is situated posteriorly such that the recurrent laryngeal nerve traverses across the anterior surface of the enlarged parathyroid gland.  The nerve is gently mobilized and the gland is delivered from beneath the nerve taking care to avoid injury to the nerve.  Vascular pedicle was then divided between small ligaclips and the parathyroid gland is excised and removed.  Frozen section biopsy confirms hypercellular parathyroid tissue.  Remainder of the gland is placed in iced saline on the back table.  The neck is irrigated with warm saline.  Good hemostasis is achieved throughout the operative field.  Fibrillar was placed throughout the operative field.  Strap muscles are reapproximated in the midline of interrupted 3-0 Vicryl sutures.  Platysma was closed with interrupted 3-0 Vicryl sutures.  Skin was closed with a running 4-0 Monocryl subcuticular suture.  Wound was washed and dried and Dermabond was applied as dressing.  Next the drapes were removed.  The patient is positioned with the left arm on an armboard.  The area over the left brachial radialis muscle is then prepped and draped in the usual aseptic fashion.  A second timeout was held.  Both of the superior parathyroid glands are used to obtain small fragments for transplantation.  1 mm fragments are taken from each of the superior parathyroid gland such that there are 8 fragments in total prepared for transplantation.  Each of these measures approximately 1 mm in diameter.  Graft next an incision was made over the left brachial radialis muscle.  Dissection was carried through subcutaneous tissues.  Skin flaps were developed circumferentially.  Good hemostasis is noted.  Using a #15 blade an incision is made in the muscle fascia.  Using a mosquito hemostat a muscular pocket is created.  A 1 mm fragment of parathyroid tissue was inserted into the muscular pocket and the  overlying fascia closed with an interrupted 4-0 Prolene simple suture.  This exercises repeated a total of 8 times.  Good hemostasis is noted.  Subcutaneous tissues are reapproximated with interrupted 3-0 Vicryl sutures.  Skin is closed with a running 4-0 Monocryl subcuticular suture.  Wound was washed and dried and Dermabond was applied as dressing.  Patient is awakened from anesthesia and transported to the recovery room in stable condition.  The patient tolerated the procedure well.   Armandina Gemma, [facility] Office: [phone]

## 2022-05-31 NOTE — Consult Note (Signed)
[facility] Renal Consultation Note    Indication for Consultation:  Management of ESRD/hemodialysis; anemia, hypertension/volume and secondary hyperparathyroidism PCP: Not on file Nephrologist: Dr. Marylou Flesher  HPI: Micheal Bean is a 37 y.o. male with ESRD on peritoneal dialysis. 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 PTH have been trending [phone] with PO4 8.7-11.7. He was admitted today for parathyroidectomy per Dr. Arther Abbott.   Seen in PACU. Awake C/O sore throat post intubation. Educated about surgery and post op care-specifically compliance to calcium supplements. Renal function panel just drawn by PACU nurse (I appreciate her help!) Pt tells me that his BP has been higher recently. He usually does one exchange of 2.5% and 3 exchanges of 1.5%. We agree that he will do half and half tonight to lower BP.    Past Medical History:  Diagnosis Date   Anemia    ESRD (end stage renal disease) ([place])    PD at home   History of blood transfusion 11/2020   4 units   History of blood transfusion    Seizure ([place])    x 1 in [place]- 02/2021   Past Surgical History:  Procedure Laterality Date   AV FISTULA PLACEMENT Right 03/02/2021   Procedure: RIGHT ARM Radio-Cephalic  ARTERIOVENOUS (AV) FISTULA CREATION;  Surgeon: [name], MD;  Location: [place];  Service: Vascular;  Laterality: Right;   CAPD INSERTION N/A 05/20/2021   Procedure: LAPAROSCOPIC INSERTION CONTINUOUS AMBULATORY PERITONEAL DIALYSIS  CATHETER WITH POSSIBLE OMENTOPEXY;  Surgeon: Cherre Robins, MD;  Location: [facility];  Service: Vascular;  Laterality: N/A;   IR FLUORO GUIDE CV LINE RIGHT  11/18/2020   IR US GUIDE VASC ACCESS RIGHT  11/18/2020   NEPHRECTOMY     childhhod, RT side   QUADRICEPS TENDON REPAIR Right 10/29/2021   Procedure: RIGHT QUADRICEP REPAIR AND PRIMARY REPAIR ANTERIOR CAPSULE;  Surgeon: Georgeanna Harrison, MD;  Location: [place];  Service: Orthopedics;   Laterality: Right;   Family History  Problem Relation Age of Onset   Hypertension Mother    Kidney disease Maternal Grandmother    Social History:  reports that he has never smoked. He has never used smokeless tobacco. He reports that he does not currently use alcohol. He reports that he does not currently use drugs. No Known Allergies Prior to Admission medications   Medication Sig Start Date End Date Taking? Authorizing Provider  calcitRIOL (ROCALTROL) 0.25 MCG capsule Take 0.5 mcg by mouth daily. 08/26/21  Yes [provider]  calcium acetate (PHOSLO) 667 MG capsule Take 1,334 mg by mouth 3 (three) times daily with meals.   Yes [provider]  gentamicin cream (GARAMYCIN) 0.1 % Apply 1 Application topically daily as needed (Dressing change). 05/21/21  Yes [provider]  multivitamin (RENA-VIT) TABS tablet TAKE 1 TABLET BY MOUTH EVERYDAY AT BEDTIME Patient taking differently: Take 1 tablet by mouth daily. 06/08/21  Yes [name], MD  sevelamer carbonate (RENVELA) 800 MG tablet Take 1,600 mg by mouth 3 (three) times daily. 12/25/20  Yes [provider]   Current Facility-Administered Medications  Medication Dose Route Frequency Provider Last Rate Last Admin   amisulpride (BARHEMSYS) injection 10 mg  10 mg Intravenous Once PRN Duane Boston, MD       Chlorhexidine Gluconate Cloth 2 % PADS 6 each  6 each Topical Once Armandina Gemma, MD       And   Chlorhexidine Gluconate Cloth 2 % PADS 6  each  6 each Topical Once Armandina Gemma, MD       fentaNYL (SUBLIMAZE) 100 MCG/2ML injection            fentaNYL (SUBLIMAZE) 100 MCG/2ML injection            fentaNYL (SUBLIMAZE) injection 25-50 mcg  25-50 mcg Intravenous Q5 min PRN Duane Boston, MD   50 mcg at 05/31/22 1124   hydrALAZINE (APRESOLINE) 20 MG/ML injection            labetalol (NORMODYNE) 5 MG/ML injection            lactated ringers infusion   Intravenous Continuous Hodierne, Adam, MD        promethazine (PHENERGAN) injection 6.25-12.5 mg  6.25-12.5 mg Intravenous Q15 min PRN Duane Boston, MD       Labs: Basic Metabolic Panel: Recent Labs  Lab 05/26/22 1446 05/31/22 0607  NA 139 137  K 6.2* 4.3  CL 103 104  CO2 18*  --   GLUCOSE 89 167*  BUN 95* 100*  CREATININE 22.12* >18.00*  CALCIUM 9.3  --    Liver Function Tests: No results for input(s): "AST", "ALT", "ALKPHOS", "BILITOT", "PROT", "ALBUMIN" in the last 168 hours. No results for input(s): "LIPASE", "AMYLASE" in the last 168 hours. No results for input(s): "AMMONIA" in the last 168 hours. CBC: Recent Labs  Lab 05/26/22 1446 05/31/22 0607  WBC 6.0  --   HGB 11.3* 9.5*  HCT 36.6* 28.0*  MCV 95.6  --   PLT 302  --    Cardiac Enzymes: No results for input(s): "CKTOTAL", "CKMB", "CKMBINDEX", "TROPONINI" in the last 168 hours. CBG: No results for input(s): "GLUCAP" in the last 168 hours. Iron Studies: No results for input(s): "IRON", "TIBC", "TRANSFERRIN", "FERRITIN" in the last 72 hours. Studies/Results: No results found.  ROS: As per HPI otherwise negative.   Physical Exam: Vitals:   05/31/22 1145 05/31/22 1148 05/31/22 1200 05/31/22 1215  BP: (!) 174/112 (!) 174/112 (!) 176/103 (!) 168/100  Pulse: 77  76 80  Resp: '14  14 16  '$ Temp:      TempSrc:      SpO2: 93%  99% 99%  Weight:      Height:         General: Well developed, well nourished, in no acute distress. Head: Normocephalic, atraumatic, sclera non-icteric, mucus membranes are moist Neck: Supple. JVD not elevated. Lungs: Clear bilaterally to auscultation without wheezes, rales, or rhonchi. Breathing is unlabored. Heart: RRR with S1 S2. No murmurs, rubs, or gallops appreciated. Abdomen: Soft, non-tender, non-distended with normoactive bowel sounds. No rebound/guarding. No obvious abdominal masses. M-S:  Strength and tone appear normal for age. Lower extremities:without edema or ischemic changes, no open wounds  Neuro: Alert and oriented  X 3. Moves all extremities spontaneously. Psych:  Responds to questions appropriately with a normal affect. Dialysis Access: PD catheter LLQ drsg dry intact Failed R AVF.   Dialysis Orders: Center: [facility] 4 exchanges 1.5-2.5% 2.8 liters 1.5 hr dwell time. No daytime fill. EDW 74 kg.   Assessment/Plan:  S/P Total parathyroidectomy with autotransplantation of parathyroidectomy. Stat labs ordered but not done. 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 Renal function panel q 6 hours. Will start Calcitriol 0.5 mcg PO BID and up-titrate as needed. Check magnesium and PO4  ESRD -  Continue CCPD on schedule.   Hypertension/volume  - BP elevated. No antihypertensive medications on med list. Volume seems OK. Continue OP PD Rx. May need PRN  apresoline/labetalol post op.   Anemia  - HGB 9.5. Was 10.6 05/27/2022. Follow labs  Metabolic bone disease -  Labs pending. Holding binders at present until labs are resulted.  Nutrition - K+ at goal. Renal diet when able to eat.   Kirsten Spearing H. Owens Shark, NP-C 05/31/2022, 12:40 PM  [employer] [phone]

## 2022-05-31 NOTE — Plan of Care (Signed)
  Problem: Health Behavior/Discharge Planning: Goal: Ability to manage health-related needs will improve Outcome: Progressing   

## 2022-05-31 NOTE — Anesthesia Procedure Notes (Signed)
Procedure Name: Intubation Date/Time: 05/31/2022 7:35 AM  Performed by: Minerva Ends, CRNAPre-anesthesia Checklist: Patient identified, Emergency Drugs available, Suction available and Patient being monitored Patient Re-evaluated:Patient Re-evaluated prior to induction Oxygen Delivery Method: Circle system utilized Preoxygenation: Pre-oxygenation with 100% oxygen Induction Type: IV induction Ventilation: Mask ventilation without difficulty Laryngoscope Size: Mac and 3 Grade View: Grade I Tube type: Oral Tube size: 7.0 mm Number of attempts: 1 Airway Equipment and Method: Stylet and Oral airway Placement Confirmation: ETT inserted through vocal cords under direct vision, positive ETCO2 and breath sounds checked- equal and bilateral Secured at: 23 cm Tube secured with: Tape Dental Injury: Teeth and Oropharynx as per pre-operative assessment

## 2022-05-31 NOTE — Plan of Care (Signed)

## 2022-05-31 NOTE — Interval H&P Note (Signed)
History and Physical Interval Note:  05/31/2022 7:02 AM  Micheal Bean  has presented today for surgery, with the diagnosis of SECONDARY HYPERPARATHYROIDISM.  The various methods of treatment have been discussed with the patient and family. After consideration of risks, benefits and other options for treatment, the patient has consented to    Procedure(s): TOTAL PARATHYROIDECTOMY WITH AUTOTRANSPLANT TO LEFT FOREARM (Left) as a surgical intervention.    The patient's history has been reviewed, patient examined, no change in status, stable for surgery.  I have reviewed the patient's chart and labs.  Questions were answered to the patient's satisfaction.    Armandina Gemma, [facility] [place] practice Office: [place]

## 2022-06-01 ENCOUNTER — Encounter (HOSPITAL_COMMUNITY): Payer: Self-pay | Admitting: Surgery

## 2022-06-01 LAB — RENAL FUNCTION PANEL
Albumin: 2.6 g/dL — ABNORMAL LOW (ref 3.5–5.0)
Albumin: 2.5 g/dL — ABNORMAL LOW (ref 3.5–5.0)
Albumin: 2.4 g/dL — ABNORMAL LOW (ref 3.5–5.0)
Albumin: 2.5 g/dL — ABNORMAL LOW (ref 3.5–5.0)
Anion gap: 23 — ABNORMAL HIGH (ref 5–15)
Anion gap: 20 — ABNORMAL HIGH (ref 5–15)
Anion gap: 19 — ABNORMAL HIGH (ref 5–15)
Anion gap: 17 — ABNORMAL HIGH (ref 5–15)
BUN: 89 mg/dL — ABNORMAL HIGH (ref 6–20)
BUN: 87 mg/dL — ABNORMAL HIGH (ref 6–20)
BUN: 87 mg/dL — ABNORMAL HIGH (ref 6–20)
BUN: 86 mg/dL — ABNORMAL HIGH (ref 6–20)
CO2: 13 mmol/L — ABNORMAL LOW (ref 22–32)
CO2: 15 mmol/L — ABNORMAL LOW (ref 22–32)
CO2: 17 mmol/L — ABNORMAL LOW (ref 22–32)
CO2: 19 mmol/L — ABNORMAL LOW (ref 22–32)
Calcium: 6.7 mg/dL — ABNORMAL LOW (ref 8.9–10.3)
Calcium: 6 mg/dL — CL (ref 8.9–10.3)
Calcium: 6.3 mg/dL — CL (ref 8.9–10.3)
Calcium: 6.2 mg/dL — CL (ref 8.9–10.3)
Chloride: 97 mmol/L — ABNORMAL LOW (ref 98–111)
Chloride: 100 mmol/L (ref 98–111)
Chloride: 98 mmol/L (ref 98–111)
Chloride: 100 mmol/L (ref 98–111)
Creatinine, Ser: 18.14 mg/dL — ABNORMAL HIGH (ref 0.61–1.24)
Creatinine, Ser: 17.77 mg/dL — ABNORMAL HIGH (ref 0.61–1.24)
Creatinine, Ser: 18.14 mg/dL — ABNORMAL HIGH (ref 0.61–1.24)
Creatinine, Ser: 18.28 mg/dL — ABNORMAL HIGH (ref 0.61–1.24)
GFR, Estimated: 3 mL/min — ABNORMAL LOW (ref >60)
GFR, Estimated: 3 mL/min — ABNORMAL LOW (ref >60)
GFR, Estimated: 3 mL/min — ABNORMAL LOW (ref >60)
GFR, Estimated: 3 mL/min — ABNORMAL LOW (ref >60)
Glucose, Bld: 152 mg/dL — ABNORMAL HIGH (ref 70–99)
Glucose, Bld: 115 mg/dL — ABNORMAL HIGH (ref 70–99)
Glucose, Bld: 116 mg/dL — ABNORMAL HIGH (ref 70–99)
Glucose, Bld: 115 mg/dL — ABNORMAL HIGH (ref 70–99)
Phosphorus: 9 mg/dL — ABNORMAL HIGH (ref 2.5–4.6)
Phosphorus: 10.3 mg/dL — ABNORMAL HIGH (ref 2.5–4.6)
Phosphorus: 10.5 mg/dL — ABNORMAL HIGH (ref 2.5–4.6)
Phosphorus: 10 mg/dL — ABNORMAL HIGH (ref 2.5–4.6)
Potassium: 4.8 mmol/L (ref 3.5–5.1)
Potassium: 4.6 mmol/L (ref 3.5–5.1)
Potassium: 4.3 mmol/L (ref 3.5–5.1)
Potassium: 4.5 mmol/L (ref 3.5–5.1)
Sodium: 133 mmol/L — ABNORMAL LOW (ref 135–145)
Sodium: 135 mmol/L (ref 135–145)
Sodium: 134 mmol/L — ABNORMAL LOW (ref 135–145)
Sodium: 136 mmol/L (ref 135–145)

## 2022-06-01 LAB — CBC WITH DIFFERENTIAL/PLATELET
Abs Immature Granulocytes: 0.03 10*3/uL (ref 0.00–0.07)
Basophils Absolute: 0 10*3/uL (ref 0.0–0.1)
Basophils Relative: 0 %
Eosinophils Absolute: 0.1 10*3/uL (ref 0.0–0.5)
Eosinophils Relative: 1 %
HCT: 27.5 % — ABNORMAL LOW (ref 39.0–52.0)
Hemoglobin: 8.6 g/dL — ABNORMAL LOW (ref 13.0–17.0)
Immature Granulocytes: 0 %
Lymphocytes Relative: 17 %
Lymphs Abs: 1.3 10*3/uL (ref 0.7–4.0)
MCH: 28.6 pg (ref 26.0–34.0)
MCHC: 31.3 g/dL (ref 30.0–36.0)
MCV: 91.4 fL (ref 80.0–100.0)
Monocytes Absolute: 0.7 10*3/uL (ref 0.1–1.0)
Monocytes Relative: 10 %
Neutro Abs: 5.4 10*3/uL (ref 1.7–7.7)
Neutrophils Relative %: 72 %
Platelets: 190 10*3/uL (ref 150–400)
RBC: 3.01 MIL/uL — ABNORMAL LOW (ref 4.22–5.81)
RDW: 14.4 % (ref 11.5–15.5)
WBC: 7.5 10*3/uL (ref 4.0–10.5)
nRBC: 0 % (ref 0.0–0.2)

## 2022-06-01 LAB — MAGNESIUM: Magnesium: 2.2 mg/dL (ref 1.7–2.4)

## 2022-06-01 LAB — SURGICAL PATHOLOGY

## 2022-06-01 MED ORDER — CALCIUM GLUCONATE-NACL 2-0.675 GM/100ML-% IV SOLN
2.0000 g | Freq: Once | INTRAVENOUS | Status: AC
  Administered 2022-06-01: 2000 mg via INTRAVENOUS
  Filled 2022-06-01: qty 100

## 2022-06-01 MED ORDER — CALCIUM CARBONATE ANTACID 500 MG PO CHEW
800.0000 mg | CHEWABLE_TABLET | Freq: Three times a day (TID) | ORAL | Status: DC
  Administered 2022-06-01 – 2022-06-02 (×4): 800 mg via ORAL
  Filled 2022-06-01 (×4): qty 4

## 2022-06-01 NOTE — Progress Notes (Signed)
Lab called with a critical calcium 6.0, notified Dr. Tera Helper office, waiting on a call back. Dr. Tera Helper ask wants Renal to be aware of this patient condition. Page Juanell Fairly NP, about patient labs, she is aware and place new orders.  New Entry '@1254'$  patient calcium is 6.3 at this time.

## 2022-06-01 NOTE — Plan of Care (Signed)
  Problem: Pain Managment: Goal: General experience of comfort will improve Outcome: Progressing   Problem: Skin Integrity: Goal: Risk for impaired skin integrity will decrease Outcome: Progressing   

## 2022-06-01 NOTE — Plan of Care (Signed)

## 2022-06-01 NOTE — Progress Notes (Signed)
    Assessment & Plan: POD#1 - status post total parathyroidectomy with autotransplantation to left forearm  Marked fall in calcium as expected - 6.0 mg/dl this AM  Calcium repletion by nephrology, PD management  Pain controlled, wounds look good  Patient stable after parathyroidectomy.  Hypocalcemia as expected.  Receiving IV calcium, Vit D supplements per nephrology.  Doing well from surgical standpoint.  Home when stable from nephrology standpoint.        Armandina Gemma, MD [facility] [place] practice Office: [phone]        Chief Complaint: Secondary hyperparathyroidism, ESRD  Subjective: Patient in bed, comfortable.  Pain controlled, swallowing better this AM.  Objective: Vital signs in last 24 hours: Temp:  [97.8 F (36.6 C)-98.4 F (36.9 C)] 98.4 F (36.9 C) (02/27 0657) Pulse Rate:  [59-89] 83 (02/27 0657) Resp:  [7-18] 16 (02/27 0657) BP: (127-187)/(84-133) 133/94 (02/27 0657) SpO2:  [93 %-100 %] 98 % (02/27 0657) Weight:  [74.5 kg-74.6 kg] 74.5 kg (02/27 0657) Last BM Date : 05/31/22  Intake/Output from previous day: 02/26 0701 - 02/27 0700 In: 1385.9 [P.O.:360; I.V.:1025.9] Out: 750 [Blood:5] Intake/Output this shift: No intake/output data recorded.  Physical Exam: HEENT - sclerae clear, mucous membranes moist Neck - wound dry and intact; mild STS; voice normal Ext - left forearm wound dry and intact; mild STS Neuro - alert & oriented, no focal deficits  Lab Results:  Recent Labs    05/31/22 0607  HGB 9.5*  HCT 28.0*   BMET Recent Labs    06/01/22 0013 06/01/22 0618  NA 133* 135  K 4.8 4.6  CL 97* 100  CO2 13* 15*  GLUCOSE 152* 115*  BUN 89* 87*  CREATININE 18.14* 17.77*  CALCIUM 6.7* 6.0*   PT/INR No results for input(s): "LABPROT", "INR" in the last 72 hours. Comprehensive Metabolic Panel:    Component Value Date/Time   NA 135 06/01/2022 0618   NA 133 (L) 06/01/2022 0013   K 4.6 06/01/2022 0618   K 4.8  06/01/2022 0013   CL 100 06/01/2022 0618   CL 97 (L) 06/01/2022 0013   CO2 15 (L) 06/01/2022 0618   CO2 13 (L) 06/01/2022 0013   BUN 87 (H) 06/01/2022 0618   BUN 89 (H) 06/01/2022 0013   CREATININE 17.77 (H) 06/01/2022 0618   CREATININE 18.14 (H) 06/01/2022 0013   GLUCOSE 115 (H) 06/01/2022 0618   GLUCOSE 152 (H) 06/01/2022 0013   CALCIUM 6.0 (LL) 06/01/2022 0618   CALCIUM 6.7 (L) 06/01/2022 0013   AST 18 11/25/2021 1216   AST 17 11/19/2020 0319   ALT 22 11/25/2021 1216   ALT 10 11/19/2020 0319   ALKPHOS 162 (H) 11/25/2021 1216   ALKPHOS 118 11/19/2020 0319   BILITOT 0.4 11/25/2021 1216   BILITOT 0.6 11/19/2020 0319   PROT 7.6 11/25/2021 1216   PROT 7.0 11/19/2020 0319   ALBUMIN 2.5 (L) 06/01/2022 0618   ALBUMIN 2.6 (L) 06/01/2022 0013    Studies/Results: No results found.    Armandina Gemma 06/01/2022  Patient ID: Micheal Bean, male   DOB: Aug 01, 1985, 37 y.o.   MRN: [id number]

## 2022-06-01 NOTE — Progress Notes (Addendum)
[facility] Progress Note   Subjective:   0600 Ca+ 6.0 Corrected Ca+ 7.2. Patient seen in room with lights off. No C/Os. No issues reported with PD. BP better controlled this AM. Denies symptoms of tetany. We discussed PD orders for tonight.. Educated again on importance of taking calcium supplements and binders as ordered.   Objective Vitals:   06/01/22 0018 06/01/22 0508 06/01/22 0657 06/01/22 0922  BP: (!) 140/91 127/84 (!) 133/94 (!) 142/94  Pulse: 80 88 83 86  Resp: '17  16 17  '$ Temp: 98.2 F (36.8 C) 98.2 F (36.8 C) (S) 98.4 F (36.9 C) 98 F (36.7 C)  TempSrc: Oral Oral Oral Oral  SpO2: 100% 100% 98% 100%  Weight:   74.5 kg   Height:       PHYSICAL EXAM:  General: Well developed, well nourished, in no acute distress. Neck: Supple. JVD not elevated. Incision with adhesive glue intact.  Lungs: CTAB A/P Heart: S1,S2 no M/R/G.  Abdomen: NT, NABS.  Lower extremities:without edema or ischemic changes, no open wounds, Incision with adhesive glue R forearm intact Neuro: Alert and oriented X 3. Moves all extremities spontaneously. Dialysis Access: PD catheter LLQ drsg dry intact Failed R AVF.   Additional Objective Labs: Basic Metabolic Panel: Recent Labs  Lab 05/31/22 1810 06/01/22 0013 06/01/22 0618  NA 131* 133* 135  K 5.8* 4.8 4.6  CL 98 97* 100  CO2 16* 13* 15*  GLUCOSE 144* 152* 115*  BUN 93* 89* 87*  CREATININE 18.94* 18.14* 17.77*  CALCIUM 7.2* 6.7* 6.0*  PHOS 9.4* 9.0* 10.3*   Liver Function Tests: Recent Labs  Lab 05/31/22 1810 06/01/22 0013 06/01/22 0618  ALBUMIN 2.7* 2.6* 2.5*   No results for input(s): "LIPASE", "AMYLASE" in the last 168 hours. CBC: Recent Labs  Lab 05/26/22 1446 05/31/22 0607  WBC 6.0  --   HGB 11.3* 9.5*  HCT 36.6* 28.0*  MCV 95.6  --   PLT 302  --    Blood Culture    Component Value Date/Time   SDES BLOOD LEFT FOREARM 11/14/2020 2320   SPECREQUEST  11/14/2020 2320    BOTTLES DRAWN AEROBIC ONLY  Blood Culture adequate volume   CULT  11/14/2020 2320    NO GROWTH 5 DAYS Performed at [facility], [place] [address], [place], [place] [address]    REPTSTATUS 11/20/2020 FINAL 11/14/2020 2320    Cardiac Enzymes: No results for input(s): "CKTOTAL", "CKMB", "CKMBINDEX", "TROPONINI" in the last 168 hours. CBG: No results for input(s): "GLUCAP" in the last 168 hours. Iron Studies: No results for input(s): "IRON", "TIBC", "TRANSFERRIN", "FERRITIN" in the last 72 hours. '@[id number]'$ @ Studies/Results: No results found. Medications:  sodium chloride 10 mL/hr at 05/31/22 1339   calcium gluconate     dialysis solution 1.5% low-MG/low-CA     dialysis solution 2.5% low-MG/low-CA      calcitRIOL  1 mcg Oral Q12H   calcium acetate  1,334 mg Oral TID WC   calcium carbonate  800 mg of elemental calcium Oral TID   gentamicin cream  1 Application Topical Daily     Dialysis Orders: Center: [facility] 4 exchanges 1.5-2.5% 2.8 liters 1.5 hr dwell time. No daytime fill. EDW 74 kg.    Assessment/Plan:  S/P Total parathyroidectomy with autotransplantation of parathyroidectomy. Stat labs ordered but not done. 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 Renal function panel q 6 hours. Will start Calcitriol 1.0 mcg PO BID and up-titrate as needed.Magnesium at goal. PO4  elevated. Corrected Ca+ 7.2 this AM. Ordered Calcium gluconate 2 grams IV. Started TUMS. Educated patient and staff on use of TUMS between meals. Put patient on telemetry.   ESRD -  Continue CCPD on schedule. Will use 3 exchanges 1.5% and 1 exchange 2.5% solution tonight. K+ 4.6  Hypertension/volume  -BP better controlled this AM. Close to OP EDW. Volume seems OK. Continue OP PD Rx.   Anemia  - HGB 9.5. Was 10.6 05/27/2022. Follow labs. Check CBC with noon labs.  Metabolic bone disease -  Labs pending. Holding binders at present until labs are resulted.  Nutrition - K+ at goal. Renal diet when able to eat.   Josilyn Shippee H. Lashara Urey  NP-C 06/01/2022, 9:39 AM  [employer] [phone]

## 2022-06-02 LAB — IRON AND TIBC
Iron: 23 ug/dL — ABNORMAL LOW (ref 45–182)
Saturation Ratios: 17 % — ABNORMAL LOW (ref 17.9–39.5)
TIBC: 134 ug/dL — ABNORMAL LOW (ref 250–450)
UIBC: 111 ug/dL

## 2022-06-02 LAB — RENAL FUNCTION PANEL
Albumin: 2.5 g/dL — ABNORMAL LOW (ref 3.5–5.0)
Albumin: 2.5 g/dL — ABNORMAL LOW (ref 3.5–5.0)
Albumin: 2.4 g/dL — ABNORMAL LOW (ref 3.5–5.0)
Albumin: 2.4 g/dL — ABNORMAL LOW (ref 3.5–5.0)
Anion gap: 19 — ABNORMAL HIGH (ref 5–15)
Anion gap: 16 — ABNORMAL HIGH (ref 5–15)
Anion gap: 16 — ABNORMAL HIGH (ref 5–15)
Anion gap: 13 (ref 5–15)
BUN: 80 mg/dL — ABNORMAL HIGH (ref 6–20)
BUN: 83 mg/dL — ABNORMAL HIGH (ref 6–20)
BUN: 80 mg/dL — ABNORMAL HIGH (ref 6–20)
BUN: 77 mg/dL — ABNORMAL HIGH (ref 6–20)
CO2: 17 mmol/L — ABNORMAL LOW (ref 22–32)
CO2: 18 mmol/L — ABNORMAL LOW (ref 22–32)
CO2: 18 mmol/L — ABNORMAL LOW (ref 22–32)
CO2: 19 mmol/L — ABNORMAL LOW (ref 22–32)
Calcium: 6.6 mg/dL — ABNORMAL LOW (ref 8.9–10.3)
Calcium: 6.1 mg/dL — CL (ref 8.9–10.3)
Calcium: 5.9 mg/dL — CL (ref 8.9–10.3)
Calcium: 5.9 mg/dL — CL (ref 8.9–10.3)
Chloride: 99 mmol/L (ref 98–111)
Chloride: 101 mmol/L (ref 98–111)
Chloride: 102 mmol/L (ref 98–111)
Chloride: 101 mmol/L (ref 98–111)
Creatinine, Ser: 17.43 mg/dL — ABNORMAL HIGH (ref 0.61–1.24)
Creatinine, Ser: 17.35 mg/dL — ABNORMAL HIGH (ref 0.61–1.24)
Creatinine, Ser: 17.97 mg/dL — ABNORMAL HIGH (ref 0.61–1.24)
Creatinine, Ser: 17.82 mg/dL — ABNORMAL HIGH (ref 0.61–1.24)
GFR, Estimated: 3 mL/min — ABNORMAL LOW (ref >60)
GFR, Estimated: 3 mL/min — ABNORMAL LOW (ref >60)
GFR, Estimated: 3 mL/min — ABNORMAL LOW (ref >60)
GFR, Estimated: 3 mL/min — ABNORMAL LOW (ref >60)
Glucose, Bld: 100 mg/dL — ABNORMAL HIGH (ref 70–99)
Glucose, Bld: 93 mg/dL (ref 70–99)
Glucose, Bld: 111 mg/dL — ABNORMAL HIGH (ref 70–99)
Glucose, Bld: 86 mg/dL (ref 70–99)
Phosphorus: 8.6 mg/dL — ABNORMAL HIGH (ref 2.5–4.6)
Phosphorus: 8.4 mg/dL — ABNORMAL HIGH (ref 2.5–4.6)
Phosphorus: 7.6 mg/dL — ABNORMAL HIGH (ref 2.5–4.6)
Phosphorus: 8.4 mg/dL — ABNORMAL HIGH (ref 2.5–4.6)
Potassium: 4.1 mmol/L (ref 3.5–5.1)
Potassium: 4.3 mmol/L (ref 3.5–5.1)
Potassium: 4.1 mmol/L (ref 3.5–5.1)
Potassium: 4.9 mmol/L (ref 3.5–5.1)
Sodium: 135 mmol/L (ref 135–145)
Sodium: 135 mmol/L (ref 135–145)
Sodium: 136 mmol/L (ref 135–145)
Sodium: 133 mmol/L — ABNORMAL LOW (ref 135–145)

## 2022-06-02 LAB — CALCIUM, IONIZED
Calcium, Ionized, Serum: 3.3 mg/dL — ABNORMAL LOW (ref 4.5–5.6)
Calcium, Ionized, Serum: 3.6 mg/dL — ABNORMAL LOW (ref 4.5–5.6)
Calcium, Ionized, Serum: 3.4 mg/dL — ABNORMAL LOW (ref 4.5–5.6)

## 2022-06-02 LAB — FERRITIN: Ferritin: 731 ng/mL — ABNORMAL HIGH (ref 24–336)

## 2022-06-02 MED ORDER — DARBEPOETIN ALFA 60 MCG/0.3ML IJ SOSY
60.0000 ug | PREFILLED_SYRINGE | Freq: Once | INTRAMUSCULAR | Status: DC
  Filled 2022-06-02: qty 0.3

## 2022-06-02 MED ORDER — CALCIUM CARBONATE ANTACID 500 MG PO CHEW
800.0000 mg | CHEWABLE_TABLET | Freq: Four times a day (QID) | ORAL | Status: DC
  Administered 2022-06-02 – 2022-06-03 (×3): 800 mg via ORAL
  Filled 2022-06-02 (×4): qty 4

## 2022-06-02 MED ORDER — CALCIUM GLUCONATE-NACL 2-0.675 GM/100ML-% IV SOLN
2.0000 g | Freq: Once | INTRAVENOUS | Status: AC
  Administered 2022-06-02: 2000 mg via INTRAVENOUS
  Filled 2022-06-02: qty 100

## 2022-06-02 NOTE — Progress Notes (Signed)
[facility] Progress Note   Subjective: Incorrect post wt this AM. Standing wt 76.2 kg which is close to his OP EDW. Met with patient and his mother Mardene Celeste. Explained low Cat+ post op and need for continued Ca+ supplementation and monitoring. Patient says he feels fine. Last Ca + 6.1 Last Ionized Ca 3.3 06/01/2022.     Objective Vitals:   06/01/22 1726 06/01/22 2100 06/02/22 0540 06/02/22 0903  BP: (!) 143/99 122/80 (!) 136/92 139/89  Pulse: 92 (!) 102 97 98  Resp: '16 17 18 18  '$ Temp: (S) 98.1 F (36.7 C) 98.7 F (37.1 C) (S) 98.4 F (36.9 C) 98.1 F (36.7 C)  TempSrc: Oral Oral Oral Oral  SpO2: 100% 98% 99% 98%  Weight: 80.4 kg  76.2 kg   Height:       PHYSICAL EXAM:  General: Well developed, well nourished, in no acute distress. Neck: Supple. JVD not elevated. Incision with adhesive glue intact.  Lungs: CTAB A/P Heart: S1,S2 no M/R/G.  Abdomen: NT, NABS.  Lower extremities:without edema or ischemic changes, no open wounds, Incision with adhesive glue R forearm intact Neuro: Alert and oriented X 3. Moves all extremities spontaneously. Dialysis Access: PD catheter LLQ drsg dry intact Failed R AVF.     Additional Objective Labs: Basic Metabolic Panel: Recent Labs  Lab 06/01/22 1801 06/02/22 0010 06/02/22 0622  NA 136 135 135  K 4.5 4.1 4.3  CL 100 99 101  CO2 19* 17* 18*  GLUCOSE 115* 100* 93  BUN 86* 80* 83*  CREATININE 18.28* 17.43* 17.35*  CALCIUM 6.2* 6.6* 6.1*  PHOS 10.0* 8.6* 8.4*   Liver Function Tests: Recent Labs  Lab 06/01/22 1801 06/02/22 0010 06/02/22 0622  ALBUMIN 2.5* 2.5* 2.5*   No results for input(s): "LIPASE", "AMYLASE" in the last 168 hours. CBC: Recent Labs  Lab 05/26/22 1446 05/31/22 0607 06/01/22 1127  WBC 6.0  --  7.5  NEUTROABS  --   --  5.4  HGB 11.3* 9.5* 8.6*  HCT 36.6* 28.0* 27.5*  MCV 95.6  --  91.4  PLT 302  --  190   Blood Culture    Component Value Date/Time   SDES BLOOD LEFT FOREARM 11/14/2020  2320   SPECREQUEST  11/14/2020 2320    BOTTLES DRAWN AEROBIC ONLY Blood Culture adequate volume   CULT  11/14/2020 2320    NO GROWTH 5 DAYS Performed at [facility], [place] [address] [place], [place] [address]    REPTSTATUS 11/20/2020 FINAL 11/14/2020 2320    Cardiac Enzymes: No results for input(s): "CKTOTAL", "CKMB", "CKMBINDEX", "TROPONINI" in the last 168 hours. CBG: No results for input(s): "GLUCAP" in the last 168 hours. Iron Studies: No results for input(s): "IRON", "TIBC", "TRANSFERRIN", "FERRITIN" in the last 72 hours. '@lablastinr3'$ @ Studies/Results: No results found. Medications:  sodium chloride 10 mL/hr at 05/31/22 1339   calcium gluconate     dialysis solution 1.5% low-MG/low-CA     dialysis solution 2.5% low-MG/low-CA      calcitRIOL  1 mcg Oral Q12H   calcium acetate  1,334 mg Oral TID WC   calcium carbonate  800 mg of elemental calcium Oral Q6H   gentamicin cream  1 Application Topical Daily     Dialysis Orders: Center: [facility] 4 exchanges 1.5-2.5% 2.8 liters 1.5 hr dwell time. No daytime fill. EDW 74 kg.    Assessment/Plan:  S/P Total parathyroidectomy with autotransplantation of parathyroidectomy 05/31/2022. Stat labs ordered but not done. Monitor closely  for hungry bone syndrome. Renal function panel q 6 hours. Will start Calcitriol 1.0 mcg PO BID and up-titrate as needed.Magnesium at goal. PO4 elevated. Corrected Ca+ 7. 3 this AM. Ordered Calcium gluconate 2 grams IV. Increased TUMS to 2400 mg elemental calcium 6 six hours.   Educated patient and staff on use of TUMS between meals. Patient on telemetry-SR. Marland Kitchen   ESRD -  Continue CCPD on schedule. Will use 3 exchanges 1.5% and 1 exchange 2.5% solution tonight. K+ 4.3.  Hypertension/volume  -BP controlled this AM. Close to OP EDW. Volume seems OK. Continue OP PD Rx.   Anemia  - HGB 8.6. Was 10.6 05/27/2022. Follow labs. Give Aranesp 60 mcg SQ today. Order iron panel today.   Metabolic bone  disease -  Labs pending. Holding binders at present until labs are resulted.  Nutrition - K+ at goal. Renal diet when able to eat.       Marria Mathison H. Jeramy Dimmick NP-C 06/02/2022, 10:04 AM  [employer] [phone]

## 2022-06-02 NOTE — Progress Notes (Signed)
Pre CCPD Vitals

## 2022-06-02 NOTE — Progress Notes (Signed)
Post CCPD Vitals

## 2022-06-02 NOTE — Plan of Care (Signed)

## 2022-06-02 NOTE — Progress Notes (Signed)
    Assessment & Plan: POD#2 - status post total parathyroidectomy with autotransplantation to left forearm             Marked fall in calcium as expected - 6.1 mg/dl this AM             Calcium repletion by nephrology, PD management             Pain controlled, wounds look good   Patient stable after parathyroidectomy.  Hypocalcemia as expected.  Receiving IV calcium, Vit D supplements per nephrology.  Doing well from surgical standpoint.  Home when stable from nephrology standpoint.        Micheal Gemma, MD [facility] A [place] practice Office: [phone]        Chief Complaint: Secondary hyperparathyroidism  Subjective: Patient in bed, mother at bedside.  No complaints.  Objective: Vital signs in last 24 hours: Temp:  [98.1 F (36.7 C)-98.7 F (37.1 C)] 98.1 F (36.7 C) (02/28 0903) Pulse Rate:  [91-102] 98 (02/28 0903) Resp:  [16-18] 18 (02/28 0903) BP: (122-143)/(80-99) 139/89 (02/28 0903) SpO2:  [98 %-100 %] 98 % (02/28 0903) Weight:  [76.2 kg-80.4 kg] 76.2 kg (02/28 0540) Last BM Date : 06/01/21  Intake/Output from previous day: 02/27 0701 - 02/28 0700 In: 0  Out: 33  Intake/Output this shift: No intake/output data recorded.  Physical Exam: HEENT - sclerae clear, mucous membranes moist Neck - wound dry and intact; Dermabond in place; voice normal Ext - left forearm wound dry and intact with Dermabond Neuro - alert & oriented, no focal deficits  Lab Results:  Recent Labs    05/31/22 0607 06/01/22 1127  WBC  --  7.5  HGB 9.5* 8.6*  HCT 28.0* 27.5*  PLT  --  190   BMET Recent Labs    06/02/22 0010 06/02/22 0622  NA 135 135  K 4.1 4.3  CL 99 101  CO2 17* 18*  GLUCOSE 100* 93  BUN 80* 83*  CREATININE 17.43* 17.35*  CALCIUM 6.6* 6.1*   PT/INR No results for input(s): "LABPROT", "INR" in the last 72 hours. Comprehensive Metabolic Panel:    Component Value Date/Time   NA 135 06/02/2022 0622   NA 135 06/02/2022 0010   K 4.3  06/02/2022 0622   K 4.1 06/02/2022 0010   CL 101 06/02/2022 0622   CL 99 06/02/2022 0010   CO2 18 (L) 06/02/2022 0622   CO2 17 (L) 06/02/2022 0010   BUN 83 (H) 06/02/2022 0622   BUN 80 (H) 06/02/2022 0010   CREATININE 17.35 (H) 06/02/2022 0622   CREATININE 17.43 (H) 06/02/2022 0010   GLUCOSE 93 06/02/2022 0622   GLUCOSE 100 (H) 06/02/2022 0010   CALCIUM 6.1 (LL) 06/02/2022 0622   CALCIUM 6.6 (L) 06/02/2022 0010   AST 18 11/25/2021 1216   AST 17 11/19/2020 0319   ALT 22 11/25/2021 1216   ALT 10 11/19/2020 0319   ALKPHOS 162 (H) 11/25/2021 1216   ALKPHOS 118 11/19/2020 0319   BILITOT 0.4 11/25/2021 1216   BILITOT 0.6 11/19/2020 0319   PROT 7.6 11/25/2021 1216   PROT 7.0 11/19/2020 0319   ALBUMIN 2.5 (L) 06/02/2022 0622   ALBUMIN 2.5 (L) 06/02/2022 0010    Studies/Results: No results found.    Micheal Bean 06/02/2022  Patient ID: Micheal Bean, male   DOB: [birth year], 37 y.o.   MRN: [id number]

## 2022-06-03 LAB — RENAL FUNCTION PANEL
Albumin: 2.4 g/dL — ABNORMAL LOW (ref 3.5–5.0)
Albumin: 2.4 g/dL — ABNORMAL LOW (ref 3.5–5.0)
Albumin: 2.5 g/dL — ABNORMAL LOW (ref 3.5–5.0)
Albumin: 2.5 g/dL — ABNORMAL LOW (ref 3.5–5.0)
Anion gap: 17 — ABNORMAL HIGH (ref 5–15)
Anion gap: 15 (ref 5–15)
Anion gap: 16 — ABNORMAL HIGH (ref 5–15)
Anion gap: 14 (ref 5–15)
BUN: 74 mg/dL — ABNORMAL HIGH (ref 6–20)
BUN: 69 mg/dL — ABNORMAL HIGH (ref 6–20)
BUN: 67 mg/dL — ABNORMAL HIGH (ref 6–20)
BUN: 69 mg/dL — ABNORMAL HIGH (ref 6–20)
CO2: 19 mmol/L — ABNORMAL LOW (ref 22–32)
CO2: 20 mmol/L — ABNORMAL LOW (ref 22–32)
CO2: 19 mmol/L — ABNORMAL LOW (ref 22–32)
CO2: 21 mmol/L — ABNORMAL LOW (ref 22–32)
Calcium: 6.1 mg/dL — CL (ref 8.9–10.3)
Calcium: 6 mg/dL — CL (ref 8.9–10.3)
Calcium: 6.2 mg/dL — CL (ref 8.9–10.3)
Calcium: 5.7 mg/dL — CL (ref 8.9–10.3)
Chloride: 99 mmol/L (ref 98–111)
Chloride: 101 mmol/L (ref 98–111)
Chloride: 99 mmol/L (ref 98–111)
Chloride: 99 mmol/L (ref 98–111)
Creatinine, Ser: 17.23 mg/dL — ABNORMAL HIGH (ref 0.61–1.24)
Creatinine, Ser: 16.68 mg/dL — ABNORMAL HIGH (ref 0.61–1.24)
Creatinine, Ser: 16.54 mg/dL — ABNORMAL HIGH (ref 0.61–1.24)
Creatinine, Ser: 16.68 mg/dL — ABNORMAL HIGH (ref 0.61–1.24)
GFR, Estimated: 3 mL/min — ABNORMAL LOW (ref >60)
GFR, Estimated: 3 mL/min — ABNORMAL LOW (ref >60)
GFR, Estimated: 3 mL/min — ABNORMAL LOW (ref >60)
GFR, Estimated: 3 mL/min — ABNORMAL LOW (ref >60)
Glucose, Bld: 88 mg/dL (ref 70–99)
Glucose, Bld: 92 mg/dL (ref 70–99)
Glucose, Bld: 72 mg/dL (ref 70–99)
Glucose, Bld: 109 mg/dL — ABNORMAL HIGH (ref 70–99)
Phosphorus: 7.6 mg/dL — ABNORMAL HIGH (ref 2.5–4.6)
Phosphorus: 7.2 mg/dL — ABNORMAL HIGH (ref 2.5–4.6)
Phosphorus: 7.7 mg/dL — ABNORMAL HIGH (ref 2.5–4.6)
Phosphorus: 7.8 mg/dL — ABNORMAL HIGH (ref 2.5–4.6)
Potassium: 4.2 mmol/L (ref 3.5–5.1)
Potassium: 3.9 mmol/L (ref 3.5–5.1)
Potassium: 3.9 mmol/L (ref 3.5–5.1)
Potassium: 4.4 mmol/L (ref 3.5–5.1)
Sodium: 135 mmol/L (ref 135–145)
Sodium: 136 mmol/L (ref 135–145)
Sodium: 134 mmol/L — ABNORMAL LOW (ref 135–145)
Sodium: 134 mmol/L — ABNORMAL LOW (ref 135–145)

## 2022-06-03 LAB — MAGNESIUM: Magnesium: 2 mg/dL (ref 1.7–2.4)

## 2022-06-03 MED ORDER — GENTAMICIN SULFATE 0.1 % EX CREA
1.0000 | TOPICAL_CREAM | Freq: Every day | CUTANEOUS | Status: DC
  Filled 2022-06-03: qty 15

## 2022-06-03 MED ORDER — CALCIUM GLUCONATE-NACL 2-0.675 GM/100ML-% IV SOLN
2.0000 g | Freq: Once | INTRAVENOUS | Status: AC
  Administered 2022-06-03: 2000 mg via INTRAVENOUS
  Filled 2022-06-03: qty 100

## 2022-06-03 MED ORDER — CALCIUM CARBONATE ANTACID 500 MG PO CHEW
800.0000 mg | CHEWABLE_TABLET | Freq: Three times a day (TID) | ORAL | Status: DC
  Filled 2022-06-03: qty 4

## 2022-06-03 MED ORDER — CALCITRIOL 0.5 MCG PO CAPS
2.0000 ug | ORAL_CAPSULE | Freq: Two times a day (BID) | ORAL | Status: DC
  Administered 2022-06-03 – 2022-06-04 (×3): 2 ug via ORAL
  Filled 2022-06-03 (×4): qty 4

## 2022-06-03 MED ORDER — DARBEPOETIN ALFA 60 MCG/0.3ML IJ SOSY
60.0000 ug | PREFILLED_SYRINGE | Freq: Once | INTRAMUSCULAR | Status: AC
  Administered 2022-06-03: 60 ug via SUBCUTANEOUS
  Filled 2022-06-03: qty 0.3

## 2022-06-03 MED ORDER — DELFLEX-LC/2.5% DEXTROSE 394 MOSM/L IP SOLN: INTRAPERITONEAL | Status: DC

## 2022-06-03 MED ORDER — DELFLEX-LC/1.5% DEXTROSE 344 MOSM/L IP SOLN: INTRAPERITONEAL | Status: DC

## 2022-06-03 MED ORDER — SODIUM CHLORIDE 0.9 % IV SOLN
250.0000 mg | Freq: Once | INTRAVENOUS | Status: AC
  Administered 2022-06-03: 250 mg via INTRAVENOUS
  Filled 2022-06-03: qty 20

## 2022-06-03 NOTE — Progress Notes (Signed)
[facility] Progress Note   Subjective: Issues with compliance with TUMS. Corrected calcium remains 7.3. Continue repleting Ca+. Continue PD as ordered.   Objective Vitals:   06/02/22 1633 06/02/22 2104 06/03/22 0541 06/03/22 0854  BP: (!) 135/90 (!) 133/91 (!) 143/100 (!) 144/95  Pulse: 94 85 85 87  Resp: '18 17  18  '$ Temp: 98.5 F (36.9 C) (S) 98.4 F (36.9 C) 98.2 F (36.8 C) 98.2 F (36.8 C)  TempSrc: Oral Oral Oral Oral  SpO2: 100% 100% 100% 99%  Weight:      Height:       PHYSICAL EXAM:  General: Well developed, well nourished, in no acute distress. Neck: Supple. JVD not elevated. Incision with adhesive glue intact.  Lungs: CTAB A/P Heart: S1,S2 no M/R/G.  Abdomen: NT, NABS.  Lower extremities:without edema or ischemic changes, no open wounds, Incision with adhesive glue R forearm intact Neuro: Alert and oriented X 3. Moves all extremities spontaneously. Dialysis Access: PD catheter LLQ drsg dry intact Failed R AVF.     Additional Objective Labs: Basic Metabolic Panel: Recent Labs  Lab 06/02/22 1817 06/03/22 0043 06/03/22 0618  NA 133* 135 136  K 4.9 4.2 3.9  CL 101 99 101  CO2 19* 19* 20*  GLUCOSE 86 88 92  BUN 77* 74* 69*  CREATININE 17.82* 17.23* 16.68*  CALCIUM 5.9* 6.1* 6.0*  PHOS 8.4* 7.6* 7.2*   Liver Function Tests: Recent Labs  Lab 06/02/22 1817 06/03/22 0043 06/03/22 0618  ALBUMIN 2.4* 2.4* 2.4*   No results for input(s): "LIPASE", "AMYLASE" in the last 168 hours. CBC: Recent Labs  Lab 05/31/22 0607 06/01/22 1127  WBC  --  7.5  NEUTROABS  --  5.4  HGB 9.5* 8.6*  HCT 28.0* 27.5*  MCV  --  91.4  PLT  --  190   Blood Culture    Component Value Date/Time   SDES BLOOD LEFT FOREARM 11/14/2020 2320   SPECREQUEST  11/14/2020 2320    BOTTLES DRAWN AEROBIC ONLY Blood Culture adequate volume   CULT  11/14/2020 2320    NO GROWTH 5 DAYS Performed at [facility], [place] [address] [place], [place] [address]     REPTSTATUS 11/20/2020 FINAL 11/14/2020 2320    Cardiac Enzymes: No results for input(s): "CKTOTAL", "CKMB", "CKMBINDEX", "TROPONINI" in the last 168 hours. CBG: No results for input(s): "GLUCAP" in the last 168 hours. Iron Studies:  Recent Labs    06/02/22 1144  IRON 23*  TIBC 134*  FERRITIN 731*   '@lablastinr3'$ @ Studies/Results: No results found. Medications:  sodium chloride 10 mL/hr at 05/31/22 1339   dialysis solution 1.5% low-MG/low-CA     dialysis solution 2.5% low-MG/low-CA      calcitRIOL  1 mcg Oral Q12H   calcium acetate  1,334 mg Oral TID WC   calcium carbonate  800 mg of elemental calcium Oral Q6H   darbepoetin (ARANESP) injection - NON-DIALYSIS  60 mcg Subcutaneous Once   gentamicin cream  1 Application Topical Daily     Dialysis Orders: Center: [facility] 4 exchanges 1.5-2.5% 2.8 liters 1.5 hr dwell time. No daytime fill. EDW 74 kg.    Assessment/Plan:  S/P Total parathyroidectomy with autotransplantation of parathyroidectomy 05/31/2022. Stat labs ordered but not done. 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 Renal function panel q 6 hours. Will start Calcitriol 1.0 mcg PO BID and up-titrate as needed.Magnesium at goal. PO4 elevated. Corrected Ca+ 7. 3 this AM. Ordered Calcium gluconate 2 grams  IV. Increased TUMS to 2400 mg elemental calcium 6 six hours.   Educated patient and staff on use of TUMS between meals. Patient on telemetry-SR. Marland Kitchen   ESRD -  Continue CCPD on schedule. Will use 3 exchanges 1.5% and 1 exchange 2.5% solution tonight. K+ 4.3.  Hypertension/volume  -BP controlled this AM. Close to OP EDW. Volume seems OK. Continue OP PD Rx.   Anemia  - HGB 8.6. Was 10.6 05/27/2022. Follow labs. Give Aranesp 60 mcg SQ today. Order iron panel today.   Metabolic bone disease -  Labs pending. Holding binders at present until labs are resulted.  Nutrition - K+ at goal. Renal diet when able to eat.  Nuala Chiles H. Jaree Dwight NP-C 06/03/2022, 9:16 AM  [employer] [phone]

## 2022-06-03 NOTE — Care Management Important Message (Signed)
Important Message  Patient Details  Name: Micheal Bean MRN: [id number] Date of Birth: Apr 03, 1986   Medicare Important Message Given:  Yes     Hannah Beat 06/03/2022, 12:00 PM

## 2022-06-03 NOTE — Progress Notes (Signed)
    Assessment & Plan: POD#3 - status post total parathyroidectomy with autotransplantation to left forearm             Calcium remains low despite repletion - 6.0 mg/dl this AM             Calcium Rx by nephrology, PD management             No pain, wounds look good   Patient stable after parathyroidectomy.  Hypocalcemia as expected.  Receiving IV calcium, Vit D supplements per nephrology.  Doing well from surgical standpoint.  Home when stable from nephrology standpoint.  Patient wants to go home.  Complains of back pain due to hospital bed.        Armandina Gemma, MD [facility] A [place] practice Office: [phone]        Chief Complaint: Secondary hyperparathyroidism  Subjective: Patient up in chair, no complaints except uncomfortable hospital bed.  Objective: Vital signs in last 24 hours: Temp:  [98.2 F (36.8 C)-98.5 F (36.9 C)] 98.2 F (36.8 C) (02/29 0854) Pulse Rate:  [85-94] 87 (02/29 0854) Resp:  [17-18] 18 (02/29 0854) BP: (133-144)/(90-100) 144/95 (02/29 0854) SpO2:  [99 %-100 %] 99 % (02/29 0854) Last BM Date : 06/02/22  Intake/Output from previous day: 02/28 0701 - 02/29 0700 In: 150 [P.O.:150] Out: 300 [Urine:300] Intake/Output this shift: No intake/output data recorded.  Physical Exam: HEENT - sclerae clear, mucous membranes moist Neck - wound dry and intact; voice normal Ext - left forearm wound dry and intact with Dermabond in place  Neuro - alert & oriented, no focal deficits  Lab Results:  Recent Labs    06/01/22 1127  WBC 7.5  HGB 8.6*  HCT 27.5*  PLT 190   BMET Recent Labs    06/03/22 0043 06/03/22 0618  NA 135 136  K 4.2 3.9  CL 99 101  CO2 19* 20*  GLUCOSE 88 92  BUN 74* 69*  CREATININE 17.23* 16.68*  CALCIUM 6.1* 6.0*   PT/INR No results for input(s): "LABPROT", "INR" in the last 72 hours. Comprehensive Metabolic Panel:    Component Value Date/Time   NA 136 06/03/2022 0618   NA 135 06/03/2022 0043    K 3.9 06/03/2022 0618   K 4.2 06/03/2022 0043   CL 101 06/03/2022 0618   CL 99 06/03/2022 0043   CO2 20 (L) 06/03/2022 0618   CO2 19 (L) 06/03/2022 0043   BUN 69 (H) 06/03/2022 0618   BUN 74 (H) 06/03/2022 0043   CREATININE 16.68 (H) 06/03/2022 0618   CREATININE 17.23 (H) 06/03/2022 0043   GLUCOSE 92 06/03/2022 0618   GLUCOSE 88 06/03/2022 0043   CALCIUM 6.0 (LL) 06/03/2022 0618   CALCIUM 6.1 (LL) 06/03/2022 0043   AST 18 11/25/2021 1216   AST 17 11/19/2020 0319   ALT 22 11/25/2021 1216   ALT 10 11/19/2020 0319   ALKPHOS 162 (H) 11/25/2021 1216   ALKPHOS 118 11/19/2020 0319   BILITOT 0.4 11/25/2021 1216   BILITOT 0.6 11/19/2020 0319   PROT 7.6 11/25/2021 1216   PROT 7.0 11/19/2020 0319   ALBUMIN 2.4 (L) 06/03/2022 0618   ALBUMIN 2.4 (L) 06/03/2022 0043    Studies/Results: No results found.    Armandina Gemma 06/03/2022  Patient ID: Micheal Bean, male   DOB: 12/27/1985, 37 y.o.   MRN: [id number]

## 2022-06-03 NOTE — Progress Notes (Signed)
Called Dr Tera Helper office with critical value Ca+ = 6.0. Spoke with Abigail Butts, RN. Gave Ca+ = 6.0, patient refusing tums due to diarrhea and would like to know what Dr Harlow Asa would like done. Abigail Butts will page Dr who is in surgery. Micheal Bean number of my phone. Tried contacting Dr earlier but office was closed, no other contact number available. Charge RN said to call number listed here in file.

## 2022-06-04 LAB — RENAL FUNCTION PANEL
Albumin: 2.4 g/dL — ABNORMAL LOW (ref 3.5–5.0)
Albumin: 2.3 g/dL — ABNORMAL LOW (ref 3.5–5.0)
Albumin: 2.3 g/dL — ABNORMAL LOW (ref 3.5–5.0)
Anion gap: 15 (ref 5–15)
Anion gap: 15 (ref 5–15)
Anion gap: 14 (ref 5–15)
BUN: 64 mg/dL — ABNORMAL HIGH (ref 6–20)
BUN: 59 mg/dL — ABNORMAL HIGH (ref 6–20)
BUN: 60 mg/dL — ABNORMAL HIGH (ref 6–20)
CO2: 20 mmol/L — ABNORMAL LOW (ref 22–32)
CO2: 20 mmol/L — ABNORMAL LOW (ref 22–32)
CO2: 21 mmol/L — ABNORMAL LOW (ref 22–32)
Calcium: 5.6 mg/dL — CL (ref 8.9–10.3)
Calcium: 5.3 mg/dL — CL (ref 8.9–10.3)
Calcium: 5.7 mg/dL — CL (ref 8.9–10.3)
Chloride: 98 mmol/L (ref 98–111)
Chloride: 100 mmol/L (ref 98–111)
Chloride: 99 mmol/L (ref 98–111)
Creatinine, Ser: 16.08 mg/dL — ABNORMAL HIGH (ref 0.61–1.24)
Creatinine, Ser: 15.97 mg/dL — ABNORMAL HIGH (ref 0.61–1.24)
Creatinine, Ser: 16.18 mg/dL — ABNORMAL HIGH (ref 0.61–1.24)
GFR, Estimated: 4 mL/min — ABNORMAL LOW (ref >60)
GFR, Estimated: 4 mL/min — ABNORMAL LOW (ref >60)
GFR, Estimated: 4 mL/min — ABNORMAL LOW (ref >60)
Glucose, Bld: 106 mg/dL — ABNORMAL HIGH (ref 70–99)
Glucose, Bld: 80 mg/dL (ref 70–99)
Glucose, Bld: 76 mg/dL (ref 70–99)
Phosphorus: 7.2 mg/dL — ABNORMAL HIGH (ref 2.5–4.6)
Phosphorus: 7.1 mg/dL — ABNORMAL HIGH (ref 2.5–4.6)
Phosphorus: 6.8 mg/dL — ABNORMAL HIGH (ref 2.5–4.6)
Potassium: 3.9 mmol/L (ref 3.5–5.1)
Potassium: 3.7 mmol/L (ref 3.5–5.1)
Potassium: 3.6 mmol/L (ref 3.5–5.1)
Sodium: 133 mmol/L — ABNORMAL LOW (ref 135–145)
Sodium: 135 mmol/L (ref 135–145)
Sodium: 134 mmol/L — ABNORMAL LOW (ref 135–145)

## 2022-06-04 LAB — CALCIUM, IONIZED: Calcium, Ionized, Serum: 3.4 mg/dL — ABNORMAL LOW (ref 4.5–5.6)

## 2022-06-04 MED ORDER — CALCIUM GLUCONATE-NACL 2-0.675 GM/100ML-% IV SOLN
2.0000 g | Freq: Once | INTRAVENOUS | Status: AC
  Administered 2022-06-04: 2000 mg via INTRAVENOUS
  Filled 2022-06-04: qty 100

## 2022-06-04 MED ORDER — CALCIUM CITRATE 950 (200 CA) MG PO TABS
800.0000 mg | ORAL_TABLET | Freq: Three times a day (TID) | ORAL | Status: DC
  Administered 2022-06-04: 800 mg via ORAL
  Filled 2022-06-04 (×3): qty 4

## 2022-06-04 MED ORDER — SODIUM CHLORIDE 0.9 % IV SOLN
250.0000 mg | Freq: Once | INTRAVENOUS | Status: AC
  Administered 2022-06-04: 250 mg via INTRAVENOUS
  Filled 2022-06-04: qty 20

## 2022-06-04 MED ORDER — CALCIUM ACETATE (PHOS BINDER) 667 MG PO CAPS
2001.0000 mg | ORAL_CAPSULE | Freq: Three times a day (TID) | ORAL | Status: DC
  Administered 2022-06-04: 2001 mg via ORAL
  Filled 2022-06-04: qty 3

## 2022-06-04 NOTE — Progress Notes (Signed)
[facility] Progress Note   Subjective:   Patient seen and examined at bedside.  Reports lack of sleep due to slow drain alarm from PD overnight waking him up. Otherwise feeling ok.  Denies pain, numbness/tingling in extremities or around mouth, muscle twitching/jerking, stiffness in jaw, SOB, edema, orthopnea and n/v. Ongoing diarrhea with TUMS, within a few minutes of taking.  He is adamant about going home today.  Agreed to try different type of PO calcium supplement to see if better tolerated than TUMS.    Objective Vitals:   06/03/22 0541 06/03/22 0854 06/03/22 1250 06/03/22 1652  BP: (!) 143/100 (!) 144/95  (!) 139/99  Pulse: 85 87  89  Resp:  18  17  Temp: 98.2 F (36.8 C) 98.2 F (36.8 C)  97.9 F (36.6 C)  TempSrc: Oral Oral    SpO2: 100% 99%  100%  Weight:   76.5 kg   Height:       Physical Exam General:well appearing male in NAD Heart:RRR, no mrg Lungs:CTAB, nml WOB on RA Abdomen:soft, NTND Extremities:no LE edema Dialysis Access: PD cath in place in LLQ, dressed   Filed Weights   06/01/22 1726 06/02/22 0540 06/03/22 1250  Weight: 80.4 kg 76.2 kg 76.5 kg    Intake/Output Summary (Last 24 hours) at 06/04/2022 1015 Last data filed at 06/04/2022 0657 Gross per 24 hour  Intake 605.22 ml  Output 717 ml  Net -111.78 ml    Additional Objective Labs: Basic Metabolic Panel: Recent Labs  Lab 06/03/22 1934 06/04/22 0135 06/04/22 0604  NA 134* 133* 135  K 4.4 3.9 3.7  CL 99 98 100  CO2 21* 20* 20*  GLUCOSE 109* 106* 80  BUN 69* 64* 59*  CREATININE 16.68* 16.08* 15.97*  CALCIUM 5.7* 5.6* 5.3*  PHOS 7.8* 7.2* 7.1*   Liver Function Tests: Recent Labs  Lab 06/03/22 1934 06/04/22 0135 06/04/22 0604  ALBUMIN 2.5* 2.4* 2.3*    CBC: Recent Labs  Lab 05/31/22 0607 06/01/22 1127  WBC  --  7.5  NEUTROABS  --  5.4  HGB 9.5* 8.6*  HCT 28.0* 27.5*  MCV  --  91.4  PLT  --  190   Iron Studies:  Recent Labs    06/02/22 1144  IRON 23*   TIBC 134*  FERRITIN 731*   Lab Results  Component Value Date   INR 1.2 11/25/2021   INR 1.5 (H) 11/13/2020   Studies/Results: No results found.  Medications:  sodium chloride 10 mL/hr at 06/04/22 0219   calcium gluconate     dialysis solution 1.5% low-MG/low-CA     dialysis solution 2.5% low-MG/low-CA      calcitRIOL  2 mcg Oral Q12H   calcium acetate  1,334 mg Oral TID WC   calcium citrate  800 mg of elemental calcium Oral TID   gentamicin cream  1 Application Topical Daily   gentamicin cream  1 Application Topical Daily    Dialysis Orders: [facility] - Dr. Joelyn Oms 4 exchanges 1.5-2.5% 2.8 liters 1.5 hr dwell time. No daytime fill. EDW 74 kg.    Assessment/Plan:  S/P Total parathyroidectomy with autotransplantation of parathyroidectomy 05/31/2022. 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 Renal function panel q 6 hours. Calcitriol increased to 2g BID yesterday.  Ca still dropping, last 5.3 - 6.7 corrected.  Magnesium at goal. PO4 elevated - increase phoslo to 3AC TID.  Ordered Calcium gluconate 2 grams IV this AM, given x2 overnight.  Not tolerating TUMS d/t  diarrhea, changed to calcium citriate to see if better tolerated. Educated patient and staff on use of TUMS between meals. Patient on telemetry-SR.  ESRD -  Continue CCPD on schedule. Will use 3 exchanges 1.5% and 1 exchange 2.5% solution tonight. K+ 3.7.  Has follow up with home therapies on Monday.  Hypertension/volume  -last BP close to goal.  Close to OP EDW. Volume seems OK. Continue OP PD Rx.   Anemia  - HGB 8.6. Was 10.6 05/27/2022. Follow labs. Give Aranesp 60 mcg SQ today. Tsat 17% - will give dose of iron today.   Metabolic bone disease -  Labs pending. Holding binders at present until labs are resulted.  Nutrition - K+ at goal. Renal diet with protein supplements.   Jen Mow, PA-C [facility] 06/04/2022,10:15 AM  LOS: 4 days

## 2022-06-04 NOTE — Discharge Summary (Signed)
    Physician Discharge Summary   Patient ID: Micheal Bean MRN: [id number] DOB/AGE: 1985/05/19 37 y.o.  Admit date: 05/31/2022  Discharge date: 06/04/2022  Discharge Diagnoses:  Principal Problem:   Hyperparathyroidism, secondary ([place]) Active Problems:   End stage renal disease ([place])   ESRD on hemodialysis ([place])   Secondary hyperparathyroidism of renal origin [facility])   Discharged Condition: good  Hospital Course: Patient was admitted for observation following parathyroid.  Post op course was complicated by "hungry bone" syndrome requiring aggressive calcium supplementation.  Pain was well controlled.  Tolerated diet.  Calcium levels remained low but patient asymptomatic.  Patient insistent upon discharge today.  Nephrology has reviewed and adjusted medications and dialysis regimen.  Patient scheduled for follow up appointment on Monday 06/07/2022 with Dr. Joelyn Oms.  Patient was prepared for discharge home on POD#4.  Consults: nephrology  Treatments: surgery: total parathyroidectomy with autotransplantation to forearm  Discharge Exam: Blood pressure (!) 139/99, pulse 89, temperature 97.9 F (36.6 C), resp. rate 17, height '6\' 2"'$  (1.88 m), weight 75.8 kg, SpO2 100 %. HEENT - clear Neck - wound dry and intact with Dermabond; voice normal Ext - left forearm wound dry and intact with Dermabond  Disposition: Home  Discharge Instructions     Diet - low sodium heart healthy   Complete by: As directed    Increase activity slowly   Complete by: As directed    No dressing needed   Complete by: As directed       Allergies as of 06/04/2022   No Known Allergies      Medication List     TAKE these medications    calcitRIOL 0.25 MCG capsule Commonly known as: ROCALTROL Take 0.5 mcg by mouth daily.   calcium acetate 667 MG capsule Commonly known as: PHOSLO Take 1,334 mg by mouth 3 (three) times daily with meals.   gentamicin cream 0.1 % Commonly known as: GARAMYCIN Apply 1  Application topically daily as needed (Dressing change).   multivitamin Tabs tablet TAKE 1 TABLET BY MOUTH EVERYDAY AT BEDTIME What changed: See the new instructions.   sevelamer carbonate 800 MG tablet Commonly known as: RENVELA Take 1,600 mg by mouth 3 (three) times daily.               Discharge Care Instructions  (From admission, onward)           Start     Ordered   06/04/22 0000  No dressing needed        06/04/22 1055            Follow-up Information     Armandina Gemma, MD. Schedule an appointment as soon as possible for a visit in 2 week(s).   Specialty: General Surgery Why: For wound re-check Contact information: [place] [id number] [phone]                 [name], [facility] Office: [phone]   Signed: Armandina Gemma 06/04/2022, 10:55 AM

## 2022-06-04 NOTE — Procedures (Signed)
PD note:  Patient reports that the slow drain alarm went off several times during the night, disrupting his sleep.  There were no issues of discomfort during the treatment.

## 2022-06-04 NOTE — Discharge Instructions (Signed)
[facility] - Dr. Teancum Brule  THYROID & PARATHYROID SURGERY:  POST-OP INSTRUCTIONS  Always review the instruction sheet provided by the hospital nurse at discharge.  A prescription for pain medication may be sent to your pharmacy at the time of discharge.  Take your pain medication as prescribed.  If narcotic pain medicine is not needed, then you may take acetaminophen (Tylenol) or ibuprofen (Advil) as needed for pain or soreness.  Take your normal home medications as prescribed unless otherwise directed.  If you need a refill on your pain medication, please contact the office during regular business hours.  Prescriptions will not be processed by the office after 5:00PM or on weekends.  Start with a light diet upon arrival home, such as soup and crackers or toast.  Be sure to drink plenty of fluids.  Resume your normal diet the day after surgery.  Most patients will experience some swelling and bruising on the chest and neck area.  Ice packs will help for the first 48 hours after arriving home.  Swelling and bruising will take several days to resolve.   It is common to experience some constipation after surgery.  Increasing fluid intake and taking a stool softener (Colace) will usually help to prevent this problem.  A mild laxative (Milk of Magnesia or Miralax) should be taken according to package directions if there has been no bowel movement after 48 hours.  Dermabond glue covers your incision. This seals the wound and you may shower at any time. The Dermabond will remain in place for about a week.  You may gradually remove the glue when it loosens around the edges.  If you need to loosen the Dermabond for removal, apply a layer of Vaseline to the wound for 15 minutes and then remove with a Kleenex. Your sutures are under the skin and will not show - they will dissolve on their own.  You may resume light daily activities beginning the day after discharge (such as self-care,  walking, climbing stairs), gradually increasing activities as tolerated. You may have sexual intercourse when it is comfortable. Refrain from any heavy lifting or straining until approved by your doctor. You may drive when you no longer are taking prescription pain medication, you can comfortably wear a seatbelt, and you can safely maneuver your car and apply the brakes.  You will see your doctor in the office for a follow-up appointment approximately three weeks after your surgery.  Make sure that you call for this appointment within a day or two after you arrive home to insure a convenient appointment time. Please have any requested laboratory tests performed a few days prior to your office visit so that the results will be available at your follow up appointment.  WHEN TO CALL THE [facility] OFFICE: -- Fever greater than 101.5 -- Inability to urinate -- Nausea and/or vomiting - persistent -- Extreme swelling or bruising -- Continued bleeding from incision -- Increased pain, redness, or drainage from the incision -- Difficulty swallowing or breathing -- Muscle cramping or spasms -- Numbness or tingling in hands or around lips  The clinic staff is available to answer your questions during regular business hours.  Please don't hesitate to call and ask to speak to one of the nurses if you have concerns.  [facility] OFFICE: [phone] (24 hours)  Please sign up for MyChart accounts. This will allow you to communicate directly with my nurse or myself without having to call the office. It will also allow you   to view your test results. You will need to enroll in MyChart for my office ([facility]) and for the hospital ([place]).  Micheal Hickam, MD [facility] A [facility] practice 

## 2022-06-04 NOTE — Plan of Care (Signed)
  Problem: Education: Goal: Knowledge of General Education information will improve Description: Including pain rating scale, medication(s)/side effects and non-pharmacologic comfort measures Outcome: Progressing   Problem: Nutrition: Goal: Adequate nutrition will be maintained Outcome: Progressing   Problem: Elimination: Goal: Will not experience complications related to bowel motility Outcome: Progressing Goal: Will not experience complications related to urinary retention Outcome: Progressing   Problem: Pain Managment: Goal: General experience of comfort will improve Outcome: Progressing   Problem: Safety: Goal: Ability to remain free from injury will improve Outcome: Progressing   Problem: Skin Integrity: Goal: Risk for impaired skin integrity will decrease Outcome: Progressing

## 2022-06-05 ENCOUNTER — Telehealth: Payer: Self-pay | Admitting: Nephrology

## 2022-06-05 NOTE — Telephone Encounter (Signed)
Micheal Bean [id number] 11-15-1985  Transition of Care Contact from [place]  Date of Discharge: 06/04/22 Date of Contact: 06/05/22 Method of contact: phone - attempted  Attempted to contact patient to discuss transition of care from inpatient admission and confirm he was able to pick up medications after discharge.  Patient did not answer the phone.  Will attempt to call again.  Follow up scheduled Monday at home therapies department.   Jen Mow, PA-C [employer]

## 2022-06-07 ENCOUNTER — Telehealth: Payer: Self-pay

## 2022-06-07 NOTE — Transitions of Care (Post Inpatient/ED Visit) (Signed)
   06/07/2022  Name: Micheal Bean MRN: [id number] DOB: [birth year]  Today's TOC FU Call Status: TOC FU Call Complete Date: 06/07/22  Transition Care Management Follow-up Telephone Call Date of Discharge: 06/04/22 [facility] Facility[facility]) Type of Discharge: Inpatient Admission Primary Inpatient Discharge Diagnosis:: hyperparathyroidism How have you been since you were released from the hospital?: Better Any questions or concerns?: No  Items Reviewed: Did you receive and understand the discharge instructions provided?: Yes Medications obtained and verified?: Yes (Medications Reviewed) (He said he has all of the meds he was taking prior to being in hospital. He said he thought he had a new calcium medication and he needs to call his pharmacy.  I told him that per the AVS, there are no new medications ordered but said he will call anyway) Any new allergies since your discharge?: No Dietary orders reviewed?: Yes (he said it is hard to adhere to a renal diet) Type of Diet Ordered:: renal, he said it is tough to follow. Do you have support at home?: Yes  Home Care and Equipment/Supplies: [place] Ordered?: No Any new equipment or medical supplies ordered?: No  Functional Questionnaire: Do you need assistance with bathing/showering or dressing?: No Do you need assistance with meal preparation?: No Do you need assistance with eating?: No Do you have difficulty maintaining continence: No Do you need assistance with getting out of bed/getting out of a chair/moving?: No Do you have difficulty managing or taking your medications?: No (He said he is independent with peritoneal dialysis which he does every night)  Folllow up appointments reviewed: PCP Follow-up appointment confirmed?: No MD Provider Line [phone] Given: No (He has the phone number for Dr Redmond Pulling, who is listed as her PCP and said he would call to schedule an appt when he is ready.  He did not  want to schedule an appt at this time. he did not want to remove Dr Redmond Pulling as PCP in [facility]) [facility] Follow-up appointment confirmed?: Yes Date of Specialist follow-up appointment?: 06/07/22 Follow-Up Specialty Provider:: he was on his way to the nephrologist at the time of this call.  He said he needs to call the surgeon to schedule an appointment Do you need transportation to your follow-up appointment?: No Do you understand care options if your condition(s) worsen?: Yes-patient verbalized understanding    SIGNATURE  Eden Lathe, RN

## 2022-06-08 ENCOUNTER — Encounter (HOSPITAL_COMMUNITY): Payer: Self-pay

## 2022-06-08 ENCOUNTER — Inpatient Hospital Stay (HOSPITAL_COMMUNITY)
Admission: EM | Admit: 2022-06-08 | Discharge: 2022-06-11 | DRG: 640 | Disposition: A | Discharge status: Home / Self Care | Payer: Medicare Other | Attending: Internal Medicine | Admitting: Internal Medicine

## 2022-06-08 ENCOUNTER — Other Ambulatory Visit: Payer: Self-pay

## 2022-06-08 DIAGNOSIS — N186 End stage renal disease: Secondary | ICD-10-CM | POA: Diagnosis present

## 2022-06-08 DIAGNOSIS — Z905 Acquired absence of kidney: Secondary | ICD-10-CM

## 2022-06-08 DIAGNOSIS — I120 Hypertensive chronic kidney disease with stage 5 chronic kidney disease or end stage renal disease: Secondary | ICD-10-CM | POA: Diagnosis present

## 2022-06-08 DIAGNOSIS — Z992 Dependence on renal dialysis: Secondary | ICD-10-CM | POA: Diagnosis not present

## 2022-06-08 DIAGNOSIS — Z8249 Family history of ischemic heart disease and other diseases of the circulatory system: Secondary | ICD-10-CM | POA: Diagnosis not present

## 2022-06-08 DIAGNOSIS — N2581 Secondary hyperparathyroidism of renal origin: Secondary | ICD-10-CM | POA: Diagnosis present

## 2022-06-08 DIAGNOSIS — M898X9 Other specified disorders of bone, unspecified site: Secondary | ICD-10-CM | POA: Diagnosis present

## 2022-06-08 DIAGNOSIS — Z79899 Other long term (current) drug therapy: Secondary | ICD-10-CM

## 2022-06-08 DIAGNOSIS — D649 Anemia, unspecified: Secondary | ICD-10-CM | POA: Diagnosis present

## 2022-06-08 LAB — COMPREHENSIVE METABOLIC PANEL
ALT: 6 U/L (ref 0–44)
AST: 14 U/L — ABNORMAL LOW (ref 15–41)
Albumin: 2.5 g/dL — ABNORMAL LOW (ref 3.5–5.0)
Alkaline Phosphatase: 351 U/L — ABNORMAL HIGH (ref 38–126)
Anion gap: 16 — ABNORMAL HIGH (ref 5–15)
BUN: 62 mg/dL — ABNORMAL HIGH (ref 6–20)
CO2: 22 mmol/L (ref 22–32)
Calcium: 4.7 mg/dL — CL (ref 8.9–10.3)
Chloride: 100 mmol/L (ref 98–111)
Creatinine, Ser: 17.46 mg/dL — ABNORMAL HIGH (ref 0.61–1.24)
GFR, Estimated: 3 mL/min — ABNORMAL LOW (ref >60)
Glucose, Bld: 94 mg/dL (ref 70–99)
Potassium: 4.4 mmol/L (ref 3.5–5.1)
Sodium: 138 mmol/L (ref 135–145)
Total Bilirubin: 0.4 mg/dL (ref 0.3–1.2)
Total Protein: 5.8 g/dL — ABNORMAL LOW (ref 6.5–8.1)

## 2022-06-08 LAB — CBC WITH DIFFERENTIAL/PLATELET
Abs Immature Granulocytes: 0.03 10*3/uL (ref 0.00–0.07)
Basophils Absolute: 0.1 10*3/uL (ref 0.0–0.1)
Basophils Relative: 1 %
Eosinophils Absolute: 0.3 10*3/uL (ref 0.0–0.5)
Eosinophils Relative: 3 %
HCT: 30.5 % — ABNORMAL LOW (ref 39.0–52.0)
Hemoglobin: 9.2 g/dL — ABNORMAL LOW (ref 13.0–17.0)
Immature Granulocytes: 0 %
Lymphocytes Relative: 16 %
Lymphs Abs: 1.4 10*3/uL (ref 0.7–4.0)
MCH: 28 pg (ref 26.0–34.0)
MCHC: 30.2 g/dL (ref 30.0–36.0)
MCV: 92.7 fL (ref 80.0–100.0)
Monocytes Absolute: 0.9 10*3/uL (ref 0.1–1.0)
Monocytes Relative: 10 %
Neutro Abs: 6.2 10*3/uL (ref 1.7–7.7)
Neutrophils Relative %: 70 %
Platelets: 297 10*3/uL (ref 150–400)
RBC: 3.29 MIL/uL — ABNORMAL LOW (ref 4.22–5.81)
RDW: 14.9 % (ref 11.5–15.5)
WBC: 8.8 10*3/uL (ref 4.0–10.5)
nRBC: 0 % (ref 0.0–0.2)

## 2022-06-08 LAB — URINALYSIS, ROUTINE W REFLEX MICROSCOPIC
Bilirubin Urine: NEGATIVE
Glucose, UA: 50 mg/dL — AB
Hgb urine dipstick: NEGATIVE
Ketones, ur: NEGATIVE mg/dL
Nitrite: NEGATIVE
Protein, ur: 100 mg/dL — AB
Specific Gravity, Urine: 1.005 (ref 1.005–1.030)
pH: 7 (ref 5.0–8.0)

## 2022-06-08 LAB — CBC
HCT: 30.3 % — ABNORMAL LOW (ref 39.0–52.0)
Hemoglobin: 8.8 g/dL — ABNORMAL LOW (ref 13.0–17.0)
MCH: 27.8 pg (ref 26.0–34.0)
MCHC: 29 g/dL — ABNORMAL LOW (ref 30.0–36.0)
MCV: 95.6 fL (ref 80.0–100.0)
Platelets: 249 10*3/uL (ref 150–400)
RBC: 3.17 MIL/uL — ABNORMAL LOW (ref 4.22–5.81)
RDW: 15 % (ref 11.5–15.5)
WBC: 7.4 10*3/uL (ref 4.0–10.5)
nRBC: 0 % (ref 0.0–0.2)

## 2022-06-08 LAB — HIV ANTIBODY (ROUTINE TESTING W REFLEX): HIV Screen 4th Generation wRfx: NONREACTIVE

## 2022-06-08 LAB — CREATININE, SERUM
Creatinine, Ser: 17.46 mg/dL — ABNORMAL HIGH (ref 0.61–1.24)
GFR, Estimated: 3 mL/min — ABNORMAL LOW (ref >60)

## 2022-06-08 MED ORDER — ONDANSETRON HCL 4 MG/2ML IJ SOLN: 4.0000 mg | Freq: Four times a day (QID) | INTRAMUSCULAR | Status: DC | PRN

## 2022-06-08 MED ORDER — HEPARIN SODIUM (PORCINE) 5000 UNIT/ML IJ SOLN
5000.0000 [IU] | Freq: Three times a day (TID) | INTRAMUSCULAR | Status: DC
  Administered 2022-06-08 – 2022-06-11 (×8): 5000 [IU] via SUBCUTANEOUS
  Filled 2022-06-08 (×8): qty 1

## 2022-06-08 MED ORDER — DELFLEX-LC/1.5% DEXTROSE 344 MOSM/L IP SOLN: INTRAPERITONEAL | Status: DC

## 2022-06-08 MED ORDER — DELFLEX-LC/2.5% DEXTROSE 394 MOSM/L IP SOLN: INTRAPERITONEAL | Status: DC

## 2022-06-08 MED ORDER — CALCIUM CITRATE 950 (200 CA) MG PO TABS
800.0000 mg | ORAL_TABLET | Freq: Three times a day (TID) | ORAL | Status: DC
  Administered 2022-06-08 – 2022-06-10 (×6): 800 mg via ORAL
  Filled 2022-06-08 (×9): qty 4

## 2022-06-08 MED ORDER — HEPARIN SODIUM (PORCINE) 1000 UNIT/ML IJ SOLN: INTRAPERITONEAL | Status: DC | PRN

## 2022-06-08 MED ORDER — ACETAMINOPHEN 650 MG RE SUPP: 650.0000 mg | Freq: Four times a day (QID) | RECTAL | Status: DC | PRN

## 2022-06-08 MED ORDER — SODIUM CHLORIDE 0.9 % IV SOLN: 250.0000 mL | INTRAVENOUS | Status: DC | PRN

## 2022-06-08 MED ORDER — ALBUTEROL SULFATE (2.5 MG/3ML) 0.083% IN NEBU: 2.5000 mg | INHALATION_SOLUTION | RESPIRATORY_TRACT | Status: DC | PRN

## 2022-06-08 MED ORDER — CALCIUM GLUCONATE-NACL 2-0.675 GM/100ML-% IV SOLN
2.0000 g | Freq: Four times a day (QID) | INTRAVENOUS | Status: DC
  Administered 2022-06-08 – 2022-06-09 (×2): 2000 mg via INTRAVENOUS
  Filled 2022-06-08 (×5): qty 100

## 2022-06-08 MED ORDER — OXYCODONE HCL 5 MG PO TABS: 5.0000 mg | ORAL_TABLET | ORAL | Status: DC | PRN

## 2022-06-08 MED ORDER — ACETAMINOPHEN 325 MG PO TABS: 650.0000 mg | ORAL_TABLET | Freq: Four times a day (QID) | ORAL | Status: DC | PRN

## 2022-06-08 MED ORDER — SODIUM CHLORIDE 0.9% FLUSH
3.0000 mL | INTRAVENOUS | Status: DC | PRN
  Administered 2022-06-10: 3 mL via INTRAVENOUS

## 2022-06-08 MED ORDER — CALCITRIOL 0.5 MCG PO CAPS
2.0000 ug | ORAL_CAPSULE | Freq: Two times a day (BID) | ORAL | Status: DC
  Administered 2022-06-08 – 2022-06-11 (×6): 2 ug via ORAL
  Filled 2022-06-08 (×7): qty 4

## 2022-06-08 MED ORDER — HYDRALAZINE HCL 20 MG/ML IJ SOLN: 10.0000 mg | Freq: Three times a day (TID) | INTRAMUSCULAR | Status: DC | PRN

## 2022-06-08 MED ORDER — RENA-VITE PO TABS
1.0000 | ORAL_TABLET | Freq: Every day | ORAL | Status: DC
  Administered 2022-06-08 – 2022-06-11 (×4): 1 via ORAL
  Filled 2022-06-08 (×6): qty 1

## 2022-06-08 MED ORDER — CALCIUM GLUCONATE-NACL 2-0.675 GM/100ML-% IV SOLN
2.0000 g | Freq: Once | INTRAVENOUS | Status: AC
  Administered 2022-06-08: 2000 mg via INTRAVENOUS
  Filled 2022-06-08: qty 100

## 2022-06-08 MED ORDER — ONDANSETRON HCL 4 MG PO TABS: 4.0000 mg | ORAL_TABLET | Freq: Four times a day (QID) | ORAL | Status: DC | PRN

## 2022-06-08 MED ORDER — SODIUM CHLORIDE 0.9% FLUSH
3.0000 mL | Freq: Two times a day (BID) | INTRAVENOUS | Status: DC
  Administered 2022-06-08 – 2022-06-11 (×6): 3 mL via INTRAVENOUS

## 2022-06-08 MED ORDER — GENTAMICIN SULFATE 0.1 % EX CREA
1.0000 | TOPICAL_CREAM | Freq: Every day | CUTANEOUS | Status: DC
  Filled 2022-06-08 (×2): qty 15

## 2022-06-08 NOTE — H&P (Signed)
History and Physical    Patient: Micheal Bean [id number] DOB: Feb 20, 1986 DOA: 06/08/2022 DOS: the patient was seen and examined on 06/08/2022 PCP: [name], MD  Patient coming from: Home  Chief Complaint:  Chief Complaint  Patient presents with   Abnormal Lab   HPI: Micheal Bean is a 37 y.o. male with medical history significant of ESRD on peritoneal dialysis, tertiary hyperparathyroidism-s/p total parathyroidectomy 2/26 sent from primary nephrologist office for evaluation of severe hypocalcemia.  Per patient-he had parathyroidectomy on 2/26-he was discharged from the hospital on 3/01-he has been having no concerning symptoms.  He presented to his nephrologist office yesterday for routine follow-up where blood work was done, and he was asked today to go to the emergency room as blood work showed severe hypocalcemia.  Denies any muscle cramping twitching, seizure-like activity. No fever, headache No chest pain No shortness of breath No nausea, vomiting or diarrhea No abdominal pain   Review of Systems: As mentioned in the history of present illness. All other systems reviewed and are negative. Past Medical History:  Diagnosis Date   Anemia    ESRD (end stage renal disease) ([place])    PD at home   History of blood transfusion 11/2020   4 units   History of blood transfusion    Seizure ([place])    x 1 in [place]- 02/2021   Past Surgical History:  Procedure Laterality Date   AV FISTULA PLACEMENT Right 03/02/2021   Procedure: RIGHT ARM Radio-Cephalic  ARTERIOVENOUS (AV) FISTULA CREATION;  Surgeon: [name], MD;  Location: [place];  Service: Vascular;  Laterality: Right;   CAPD INSERTION N/A 05/20/2021   Procedure: LAPAROSCOPIC INSERTION CONTINUOUS AMBULATORY PERITONEAL DIALYSIS  CATHETER WITH POSSIBLE OMENTOPEXY;  Surgeon: Cherre Robins, MD;  Location: [place];  Service: Vascular;  Laterality: N/A;   IR FLUORO GUIDE CV LINE RIGHT  11/18/2020   IR US GUIDE VASC  ACCESS RIGHT  11/18/2020   NEPHRECTOMY     childhhod, RT side   PARATHYROIDECTOMY Left 05/31/2022   Procedure: TOTAL PARATHYROIDECTOMY WITH AUTOTRANSPLANT TO LEFT FOREARM;  Surgeon: Armandina Gemma, MD;  Location: [place];  Service: General;  Laterality: Left;   QUADRICEPS TENDON REPAIR Right 10/29/2021   Procedure: RIGHT QUADRICEP REPAIR AND PRIMARY REPAIR ANTERIOR CAPSULE;  Surgeon: Georgeanna Harrison, MD;  Location: [place];  Service: Orthopedics;  Laterality: Right;   Social History:  reports that he has never smoked. He has never used smokeless tobacco. He reports that he does not currently use alcohol. He reports that he does not currently use drugs.  No Known Allergies  Family History  Problem Relation Age of Onset   Hypertension Mother    Kidney disease Maternal Grandmother     Prior to Admission medications   Medication Sig Start Date End Date Taking? Authorizing Provider  calcitRIOL (ROCALTROL) 0.25 MCG capsule Take 0.5 mcg by mouth daily. 08/26/21   [provider]  calcium acetate (PHOSLO) 667 MG capsule Take 1,334 mg by mouth 3 (three) times daily with meals.    [provider]  gentamicin cream (GARAMYCIN) 0.1 % Apply 1 Application topically daily as needed (Dressing change). 05/21/21   [provider]  multivitamin (RENA-VIT) TABS tablet TAKE 1 TABLET BY MOUTH EVERYDAY AT BEDTIME Patient taking differently: Take 1 tablet by mouth daily. 06/08/21   [name], MD  sevelamer carbonate (RENVELA) 800 MG tablet Take 1,600 mg by mouth 3 (three) times daily. 12/25/20   [provider]  Physical Exam: Vitals:   06/08/22 1301 06/08/22 1312 06/08/22 1625 06/08/22 1742  BP: (!) 152/104  (!) 155/105   Pulse: 92  75   Resp: 16  18   Temp: 98.8 F (37.1 C)   98 F (36.7 C)  TempSrc: Oral   Oral  SpO2: 96%  99%   Weight:  75.8 kg    Height:  '6\' 2"'$  (1.88 m)     Gen Exam:Alert awake-not in any distress HEENT:atraumatic, normocephalic.  Parathyroidectomy  incision looks unremarkable  Chest: B/L clear to auscultation anteriorly CVS:S1S2 regular Abdomen:soft non tender, non distended Extremities:no edema Neurology: Non focal Skin: no rash  Data Reviewed:    Latest Ref Rng & Units 06/08/2022    1:32 PM 06/01/2022   11:27 AM 05/31/2022    6:07 AM  CBC  WBC 4.0 - 10.5 K/uL 8.8  7.5    Hemoglobin 13.0 - 17.0 g/dL 9.2  8.6  9.5   Hematocrit 39.0 - 52.0 % 30.5  27.5  28.0   Platelets 150 - 400 K/uL 297  190         Latest Ref Rng & Units 06/08/2022    1:32 PM 06/04/2022   12:14 PM 06/04/2022    6:04 AM  BMP  Glucose 70 - 99 mg/dL 94  76  80   BUN 6 - 20 mg/dL 62  60  59   Creatinine 0.61 - 1.24 mg/dL 17.46  16.18  15.97   Sodium 135 - 145 mmol/L 138  134  135   Potassium 3.5 - 5.1 mmol/L 4.4  3.6  3.7   Chloride 98 - 111 mmol/L 100  99  100   CO2 22 - 32 mmol/L '22  21  20   '$ Calcium 8.9 - 10.3 mg/dL 4.7  5.7  5.3      Assessment and Plan: Hypocalcemia-s/p total parathyroidectomy 2/26 Suspicion for hungry bone syndrome Surprisingly completely asymptomatic Nephrology recommending calcitriol 2 mcg twice daily, calcium citrate 800 mg 3 times daily, IV gluconate 2 g every 6 until serum calcium> 7 Follow electrolytes closely  ESRD (history of solitary kidney with obstructive uropathy) Peritoneal dialysis per nephrologist  Normocytic anemia Secondary to ESRD Aranesp/iron per nephrology  HTN Follow closely Not on any antihypertensives at home   Advance Care Planning:   Code Status: Full Code   Consults: Renal  Family Communication: Mother at bedside  Severity of Illness: The appropriate patient status for this patient is INPATIENT. Inpatient status is judged to be reasonable and necessary in order to provide the required intensity of service to ensure the patient's safety. The patient's presenting symptoms, physical exam findings, and initial radiographic and laboratory data in the context of their chronic comorbidities is felt to  place them at high risk for further clinical deterioration. Furthermore, it is not anticipated that the patient will be medically stable for discharge from the hospital within 2 midnights of admission.   * I certify that at the point of admission it is my clinical judgment that the patient will require inpatient hospital care spanning beyond 2 midnights from the point of admission due to high intensity of service, high risk for further deterioration and high frequency of surveillance required.*  Author: Oren Binet, MD 06/08/2022 5:50 PM  For on call review [email or web address].

## 2022-06-08 NOTE — ED Notes (Signed)
Shift report received, assumed care of patient.

## 2022-06-08 NOTE — ED Notes (Addendum)
Per Pt's mother, Pt had blood draw x2 days ago and received a call today about his calcium being low.   Per chart review, Hx of low calcium and is prescribed supplements.

## 2022-06-08 NOTE — ED Provider Triage Note (Signed)
Emergency Medicine Provider Triage Evaluation Note  Micheal Bean , a 37 y.o. male  was evaluated in triage.  Pt complains of abnormal lab.  Patient was sent by kidney center due to high calcium level.  Patient receives peritoneal dialysis nightly.  He reports feeling generalized weakness and fatigue all over.  Also reports that he has noticed increased numbness in his upper and lower extremities.  Patient was still able to urinate denies any urinary symptoms at this time..  Review of Systems  Positive: As above Negative: As above  Physical Exam  BP (!) 152/104 (BP Location: Right Arm)   Pulse 92   Temp 98.8 F (37.1 C) (Oral)   Resp 16   Ht '6\' 2"'$  (1.88 m)   Wt 75.8 kg   SpO2 96%   BMI 21.46 kg/m  Gen:   Awake, no distress   Resp:  Normal effort  MSK:   Moves extremities without difficulty  Other:    Medical Decision Making  Medically screening exam initiated at 1:29 PM.  Appropriate orders placed.  MALECHI GALLENTINE was informed that the remainder of the evaluation will be completed by another provider, this initial triage assessment does not replace that evaluation, and the importance of remaining in the ED until their evaluation is complete.     Luvenia Heller, PA-C 06/08/22 1331

## 2022-06-08 NOTE — ED Triage Notes (Signed)
Pt sent from kidney center due to high calcium level. Pt does peritoneal dialysis nightly. Pt unsure what the calcium level was. Pt c.o more fatigue and numbness in his hands and feet

## 2022-06-08 NOTE — ED Notes (Signed)
ED TO INPATIENT HANDOFF REPORT  ED Nurse Name and Phone #: Rock Nephew [phone]  S Name/Age/Gender Micheal Bean 36 y.o. male Room/Bed: 033C/033C  Code Status   Code Status: Prior  Home/SNF/Other Home Patient oriented to: self, place, time, and situation Is this baseline? Yes   Triage Complete: Triage complete  Chief Complaint Hypocalcemia [E83.51]  Triage Note Pt sent from kidney center due to high calcium level. Pt does peritoneal dialysis nightly. Pt unsure what the calcium level was. Pt c.o more fatigue and numbness in his hands and feet   Allergies No Known Allergies  Level of Care/Admitting Diagnosis ED Disposition     ED Disposition  Admit   Condition  --   Comment  Hospital Area: [facility] [[id number]]  Level of Care: Telemetry Surgical [105]  May [facility] to [facility] or [facility] if equivalent level of care is available:: No  Covid Evaluation: Asymptomatic - no recent exposure (last 10 days) testing not required  Diagnosis: Hypocalcemia [275.41.ICD-9-CM]  Admitting Physician: Jonetta Osgood [[id number]]  Attending Physician: Jonetta Osgood [id number]  Certification:: I certify this patient will need inpatient services for at least 2 midnights  Estimated Length of Stay: 2          B Medical/Surgery History Past Medical History:  Diagnosis Date   Anemia    ESRD (end stage renal disease) ([place])    PD at home   History of blood transfusion 11/2020   4 units   History of blood transfusion    Seizure ([place])    x 1 in [place]- 02/2021   Past Surgical History:  Procedure Laterality Date   AV FISTULA PLACEMENT Right 03/02/2021   Procedure: RIGHT ARM Radio-Cephalic  ARTERIOVENOUS (AV) FISTULA CREATION;  Surgeon: [name], MD;  Location: [place];  Service: Vascular;  Laterality: Right;   CAPD INSERTION N/A 05/20/2021   Procedure: LAPAROSCOPIC INSERTION CONTINUOUS AMBULATORY PERITONEAL DIALYSIS  CATHETER WITH POSSIBLE  OMENTOPEXY;  Surgeon: Cherre Robins, MD;  Location: [place];  Service: Vascular;  Laterality: N/A;   IR FLUORO GUIDE CV LINE RIGHT  11/18/2020   IR US GUIDE VASC ACCESS RIGHT  11/18/2020   NEPHRECTOMY     childhhod, RT side   PARATHYROIDECTOMY Left 05/31/2022   Procedure: TOTAL PARATHYROIDECTOMY WITH AUTOTRANSPLANT TO LEFT FOREARM;  Surgeon: Armandina Gemma, MD;  Location: [place];  Service: General;  Laterality: Left;   QUADRICEPS TENDON REPAIR Right 10/29/2021   Procedure: RIGHT QUADRICEP REPAIR AND PRIMARY REPAIR ANTERIOR CAPSULE;  Surgeon: Georgeanna Harrison, MD;  Location: [place];  Service: Orthopedics;  Laterality: Right;     A IV Location/Drains/Wounds Patient Lines/Drains/Airways Status     Active Line/Drains/Airways     Name Placement date Placement time Site Days   Peripheral IV 06/08/22 18 G 1.16" Right Antecubital 06/08/22  1628  Antecubital  less than 1   Fistula / Graft Right Forearm Arteriovenous fistula 03/02/21  0905  Forearm  463   Incision - 4 Ports Abdomen Right;Upper Right;Lateral Upper;Left Superior;Left 05/20/21  1637  -- 384            Intake/Output Last 24 hours  Intake/Output Summary (Last 24 hours) at 06/08/2022 1746 Last data filed at 06/08/2022 1745 Gross per 24 hour  Intake 100 ml  Output --  Net 100 ml    Labs/Imaging Results for orders placed or performed during the hospital encounter of 06/08/22 (from the past 48 hour(s))  Urinalysis, Routine w  reflex microscopic -Urine, Clean Catch     Status: Abnormal   Collection Time: 06/08/22  1:23 PM  Result Value Ref Range   Color, Urine STRAW (A) YELLOW   APPearance CLEAR CLEAR   Specific Gravity, Urine 1.005 1.005 - 1.030   pH 7.0 5.0 - 8.0   Glucose, UA 50 (A) NEGATIVE mg/dL   Hgb urine dipstick NEGATIVE NEGATIVE   Bilirubin Urine NEGATIVE NEGATIVE   Ketones, ur NEGATIVE NEGATIVE mg/dL   Protein, ur 100 (A) NEGATIVE mg/dL   Nitrite NEGATIVE NEGATIVE   Leukocytes,Ua SMALL (A) NEGATIVE   RBC / HPF 0-5 0 -  5 RBC/hpf   WBC, UA 11-20 0 - 5 WBC/hpf   Bacteria, UA RARE (A) NONE SEEN   Squamous Epithelial / HPF 0-5 0 - 5 /HPF    Comment: Performed at [facility], [address], [place], [place] [address]  Comprehensive metabolic panel     Status: Abnormal   Collection Time: 06/08/22  1:32 PM  Result Value Ref Range   Sodium 138 135 - 145 mmol/L   Potassium 4.4 3.5 - 5.1 mmol/L   Chloride 100 98 - 111 mmol/L   CO2 22 22 - 32 mmol/L   Glucose, Bld 94 70 - 99 mg/dL    Comment: Glucose reference range applies only to samples taken after fasting for at least 8 hours.   BUN 62 (H) 6 - 20 mg/dL   Creatinine, Ser 17.46 (H) 0.61 - 1.24 mg/dL   Calcium 4.7 (LL) 8.9 - 10.3 mg/dL    Comment: CRITICAL RESULT CALLED TO, READ BACK BY AND VERIFIED WITH G TATE RN 06/08/2022 1558  B NUNNERY   Total Protein 5.8 (L) 6.5 - 8.1 g/dL   Albumin 2.5 (L) 3.5 - 5.0 g/dL   AST 14 (L) 15 - 41 U/L   ALT 6 0 - 44 U/L   Alkaline Phosphatase 351 (H) 38 - 126 U/L   Total Bilirubin 0.4 0.3 - 1.2 mg/dL   GFR, Estimated 3 (L) >60 mL/min    Comment: (NOTE) Calculated using the CKD-EPI Creatinine Equation (2021)    Anion gap 16 (H) 5 - 15    Comment: Performed at [facility], [place] [address], [place], [place] [address]  CBC with Differential     Status: Abnormal   Collection Time: 06/08/22  1:32 PM  Result Value Ref Range   WBC 8.8 4.0 - 10.5 K/uL   RBC 3.29 (L) 4.22 - 5.81 MIL/uL   Hemoglobin 9.2 (L) 13.0 - 17.0 g/dL   HCT 30.5 (L) 39.0 - 52.0 %   MCV 92.7 80.0 - 100.0 fL   MCH 28.0 26.0 - 34.0 pg   MCHC 30.2 30.0 - 36.0 g/dL   RDW 14.9 11.5 - 15.5 %   Platelets 297 150 - 400 K/uL   nRBC 0.0 0.0 - 0.2 %   Neutrophils Relative % 70 %   Neutro Abs 6.2 1.7 - 7.7 K/uL   Lymphocytes Relative 16 %   Lymphs Abs 1.4 0.7 - 4.0 K/uL   Monocytes Relative 10 %   Monocytes Absolute 0.9 0.1 - 1.0 K/uL   Eosinophils Relative 3 %   Eosinophils Absolute 0.3 0.0 - 0.5 K/uL   Basophils Relative 1 %   Basophils  Absolute 0.1 0.0 - 0.1 K/uL   Immature Granulocytes 0 %   Abs Immature Granulocytes 0.03 0.00 - 0.07 K/uL    Comment: Performed at [facility], [address] [place],  [address]   No results found.  Pending Labs Unresulted Labs (From admission, onward)     Start     Ordered   06/09/22 0500  Renal function panel  Now then every 12 hours,   R (with URGENT occurrences)      06/08/22 1721            Vitals/Pain Today's Vitals   06/08/22 1301 06/08/22 1312 06/08/22 1625 06/08/22 1742  BP: (!) 152/104  (!) 155/105   Pulse: 92  75   Resp: 16  18   Temp: 98.8 F (37.1 C)   98 F (36.7 C)  TempSrc: Oral   Oral  SpO2: 96%  99%   Weight:  75.8 kg    Height:  '6\' 2"'$  (1.88 m)    PainSc:  0-No pain      Isolation Precautions No active isolations  Medications Medications  calcium gluconate 2 g/ 100 mL sodium chloride IVPB (has no administration in time range)  calcium citrate (CALCITRATE - dosed in mg elemental calcium) tablet 800 mg of elemental calcium (has no administration in time range)  calcitRIOL (ROCALTROL) capsule 2 mcg (has no administration in time range)  gentamicin cream (GARAMYCIN) 0.1 % 1 Application (has no administration in time range)  dialysis solution 1.5% low-MG/low-CA dianeal solution (has no administration in time range)  dialysis solution 2.5% low-MG/low-CA dianeal solution (has no administration in time range)  calcium gluconate 2 g/ 100 mL sodium chloride IVPB (0 mg Intravenous Stopped 06/08/22 1745)    Mobility walks     Focused Assessments Renal Assessment Handoff:  Hemodialysis Schedule: Peritoneal Dialysis Last Hemodialysis date and time: Yesterday   Restricted appendage: N/A   R Recommendations: See Admitting Provider Note  Report given to:   Additional Notes: Patient takes peritoneal dialysis at home. He has been stable with [place], CAO x4.

## 2022-06-08 NOTE — ED Provider Notes (Signed)
[facility] Provider Note   CSN: [id number] Arrival date & time: 06/08/22  1257     History  Chief Complaint  Patient presents with   Abnormal Lab    Micheal Bean is a 37 y.o. male.   Abnormal Lab    Patient has a history of end-stage renal disease, seizure, anemia and recent left parathyroidectomy on February 26.  Patient has had issues with hypocalcemia since the procedure.  Per the discharge summary dated March 1 patient had been having some issues with hypocalcemia requiring aggressive calcium supplementation.  Patient continued to be hypocalcemic but he was asymptomatic and the patient was feeling well and wanted to be discharged.  Patient had outpatient labs and was told his calcium level was low and needed to come to the hospital.  Patient states he is not having any symptoms.  He is not having any chest pain or shortness of breath.  He denies any muscle cramping.  No weakness.  Home Medications Prior to Admission medications   Medication Sig Start Date End Date Taking? Authorizing Provider  calcitRIOL (ROCALTROL) 0.25 MCG capsule Take 0.5 mcg by mouth daily. 08/26/21   [provider]  calcium acetate (PHOSLO) 667 MG capsule Take 1,334 mg by mouth 3 (three) times daily with meals.    [provider]  gentamicin cream (GARAMYCIN) 0.1 % Apply 1 Application topically daily as needed (Dressing change). 05/21/21   [provider]  multivitamin (RENA-VIT) TABS tablet TAKE 1 TABLET BY MOUTH EVERYDAY AT BEDTIME Patient taking differently: Take 1 tablet by mouth daily. 06/08/21   [name], MD  sevelamer carbonate (RENVELA) 800 MG tablet Take 1,600 mg by mouth 3 (three) times daily. 12/25/20   [provider]      Allergies    Patient has no known allergies.    Review of Systems   Review of Systems  Physical Exam Updated Vital Signs BP (!) 155/105   Pulse 75   Temp 98 F (36.7 C) (Oral)    Resp 18   Ht 1.88 m ('6\' 2"'$ )   Wt 75.8 kg   SpO2 99%   BMI 21.46 kg/m  Physical Exam Vitals and nursing note reviewed.  Constitutional:      General: He is not in acute distress.    Appearance: He is well-developed.  HENT:     Head: Normocephalic and atraumatic.     Right Ear: External ear normal.     Left Ear: External ear normal.  Eyes:     General: No scleral icterus.       Right eye: No discharge.        Left eye: No discharge.     Conjunctiva/sclera: Conjunctivae normal.  Neck:     Trachea: No tracheal deviation.  Cardiovascular:     Rate and Rhythm: Normal rate.  Pulmonary:     Effort: Pulmonary effort is normal. No respiratory distress.     Breath sounds: No stridor.  Abdominal:     General: There is no distension.  Musculoskeletal:        General: No swelling or deformity.     Cervical back: Neck supple.     Right lower leg: No edema.     Left lower leg: No edema.  Skin:    General: Skin is warm and dry.     Findings: No rash.  Neurological:     General: No focal deficit present.     Mental Status:  He is alert. Mental status is at baseline.     Cranial Nerves: No dysarthria or facial asymmetry.     Motor: No weakness or seizure activity.     Comments: Normal strength, no muscle spasms, no facial twitch     ED Results / Procedures / Treatments   Labs (all labs ordered are listed, but only abnormal results are displayed) Labs Reviewed  COMPREHENSIVE METABOLIC PANEL - Abnormal; Notable for the following components:      Result Value   BUN 62 (*)    Creatinine, Ser 17.46 (*)    Calcium 4.7 (*)    Total Protein 5.8 (*)    Albumin 2.5 (*)    AST 14 (*)    Alkaline Phosphatase 351 (*)    GFR, Estimated 3 (*)    Anion gap 16 (*)    All other components within normal limits  CBC WITH DIFFERENTIAL/PLATELET - Abnormal; Notable for the following components:   RBC 3.29 (*)    Hemoglobin 9.2 (*)    HCT 30.5 (*)    All other components within normal limits   URINALYSIS, ROUTINE W REFLEX MICROSCOPIC - Abnormal; Notable for the following components:   Color, Urine STRAW (*)    Glucose, UA 50 (*)    Protein, ur 100 (*)    Leukocytes,Ua SMALL (*)    Bacteria, UA RARE (*)    All other components within normal limits  RENAL FUNCTION PANEL  HIV ANTIBODY (ROUTINE TESTING W REFLEX)  CBC  CREATININE, SERUM  CBC  RENAL FUNCTION PANEL    EKG None  Radiology No results found.  Procedures .Critical Care  Performed by: Dorie Rank, MD Authorized by: Dorie Rank, MD   Critical care provider statement:    Critical care time (minutes):  30   Critical care was time spent personally by me on the following activities:  Development of treatment plan with patient or surrogate, discussions with consultants, evaluation of patient's response to treatment, examination of patient, ordering and review of laboratory studies, ordering and review of radiographic studies, ordering and performing treatments and interventions, pulse oximetry, re-evaluation of patient's condition and review of old charts     Medications Ordered in ED Medications  calcium gluconate 2 g/ 100 mL sodium chloride IVPB (has no administration in time range)  calcium citrate (CALCITRATE - dosed in mg elemental calcium) tablet 800 mg of elemental calcium (has no administration in time range)  calcitRIOL (ROCALTROL) capsule 2 mcg (has no administration in time range)  gentamicin cream (GARAMYCIN) 0.1 % 1 Application (has no administration in time range)  dialysis solution 1.5% low-MG/low-CA dianeal solution (has no administration in time range)  dialysis solution 2.5% low-MG/low-CA dianeal solution (has no administration in time range)  multivitamin (RENA-VIT) tablet 1 tablet (has no administration in time range)  heparin injection 5,000 Units (has no administration in time range)  sodium chloride flush (NS) 0.9 % injection 3 mL (has no administration in time range)  sodium chloride flush  (NS) 0.9 % injection 3 mL (has no administration in time range)  0.9 %  sodium chloride infusion (has no administration in time range)  acetaminophen (TYLENOL) tablet 650 mg (has no administration in time range)    Or  acetaminophen (TYLENOL) suppository 650 mg (has no administration in time range)  oxyCODONE (Oxy IR/ROXICODONE) immediate release tablet 5 mg (has no administration in time range)  ondansetron (ZOFRAN) tablet 4 mg (has no administration in time range)    Or  ondansetron (ZOFRAN) injection 4 mg (has no administration in time range)  albuterol (PROVENTIL) (2.5 MG/3ML) 0.083% nebulizer solution 2.5 mg (has no administration in time range)  hydrALAZINE (APRESOLINE) injection 10 mg (has no administration in time range)  calcium gluconate 2 g/ 100 mL sodium chloride IVPB (0 mg Intravenous Stopped 06/08/22 1745)    ED Course/ Medical Decision Making/ A&P Clinical Course as of 06/08/22 1804  Tue Jun 08, 2022  1621 Calcium level low at 4.7.  Decreased from 5.74 days ago. [id number] CBC shows stable anemia.  Chronic kidney disease unchanged [JK]  1630 Discussed with Dr Jonnie Finner. Calcium 2 g q6 times 3.  Recommends admission [JK]    Clinical Course User Index [JK] Dorie Rank, MD                             Medical Decision Making Problems Addressed: Hypocalcemia: acute illness or injury that poses a threat to life or bodily functions  Amount and/or Complexity of Data Reviewed Labs: ordered. Decision-making details documented in ED Course.  Risk Prescription drug management. Drug therapy requiring intensive monitoring for toxicity. Decision regarding hospitalization.   Patient presented to the ED with recurrent hypocalcemia in the setting of chronic kidney disease and recent parathyroidectomy.  Patient had issues with hypocalcemia when he was in the hospital but he was asymptomatic and they are improving.  Patient had repeat labs checked and his calcium was back down to 4.7.   Patient fortunately asymptomatic.  He is not having any muscle spasms cramping.  IV calcium gluconate ordered.  I discussed the case with nephrology Dr. Jonnie Finner and Dr. Sloan Leiter hospitalist service.  Patient will be admitted to the hospital for further treatment        Final Clinical Impression(s) / ED Diagnoses Final diagnoses:  Hypocalcemia    Rx / DC Orders ED Discharge Orders     None         Dorie Rank, MD 06/08/22 [phone]

## 2022-06-08 NOTE — Progress Notes (Signed)
Pre CCPD Vitals

## 2022-06-08 NOTE — Progress Notes (Signed)
[facility] Progress Note   Subjective: pt seen in ED, was here for PTX about 10 days ago and returns now for low Ca levels from his renal MD labs (Dr Joelyn Oms). In ED Ca++ is 4.7. Pt denies any cramping, twitching or seizure-like activity.          Vitals:    06/03/22 0854 06/03/22 1250 06/03/22 1652 06/04/22 0938  BP: (!) 144/95   (!) 139/99    Pulse: 87   89    Resp: 18   17    Temp: 98.2 F (36.8 C)   97.9 F (36.6 C)    TempSrc: Oral        SpO2: 99%   100%    Weight:   76.5 kg   75.8 kg  Height:              Exam: General:well appearing male in NAD Heart:RRR, no mrg Lungs:CTAB, nml WOB on RA Abdomen:soft, NTND Extremities:no LE edema Dialysis Access: PD cath in place in LLQ, dressed       OP PD: Center: [facility] 4 exchanges 1.5-2.5% 2.8 liters 1.5 hr dwell time. No daytime fill. EDW 74 kg.    Assessment/Plan:   S/P Total parathyroidectomy with autotransplantation of parathyroidectomy 05/31/2022. Has continued hungry bones syndrome. Ca++ is down to 4.7 now. Although he is not very symptomatic, these low Ca++ levels are dangerous and we recommend hospital admission until serum Ca++ is > 7. Renal function panel bid for now. Will resume these meds for repleting Ca in the body> Calcitriol 2.0 mcg bid.  Ca citrate '800mg'$  elemental calcium tid between meals (did not tolerate po CaCO3 last admit) IV Ca gluconate 2 gm q 6 hrs until serum Ca++ > 7  ESRD -  Continue CCPD on schedule. Will use mix of 1.5% and 2.5% tonight.   Hypertension/volume  -last BP close to goal.  Close to OP EDW. Volume seems OK. Continue OP PD Rx.   Anemia  - HGB 8.6. Was 10.6 05/27/2022. Follow labs. Give Aranesp 60 mcg SQ today. Tsat 17% - will give dose of iron today.   Metabolic bone disease -  Labs pending. Holding binders at present until labs are resulted.  Nutrition - K+ at goal. Renal diet with protein supplements.        Kelly Splinter MD [facility] 06/08/2022, 4:37 PM   Last  Labs       Recent Labs  Lab 06/04/22 0604 06/04/22 1214 06/08/22 1332  HGB  --   --  9.2*  ALBUMIN 2.3* 2.3* 2.5*  CALCIUM 5.3* 5.7* 4.7*  PHOS 7.1* 6.8*  --   CREATININE 15.97* 16.18* 17.46*  K 3.7 3.6 4.4      Last Labs     Recent Labs  Lab 06/02/22 1144  IRON 23*  TIBC 134*  FERRITIN 731*      Inpatient medications:

## 2022-06-08 NOTE — Progress Notes (Signed)
[facility] Progress Note  Subjective: seen in ED  Vitals:   06/03/22 0854 06/03/22 1250 06/03/22 1652 06/04/22 0938  BP: (!) 144/95  (!) 139/99   Pulse: 87  89   Resp: 18  17   Temp: 98.2 F (36.8 C)  97.9 F (36.6 C)   TempSrc: Oral     SpO2: 99%  100%   Weight:  76.5 kg  75.8 kg  Height:        Exam: General:well appearing male in NAD Heart:RRR, no mrg Lungs:CTAB, nml WOB on RA Abdomen:soft, NTND Extremities:no LE edema Dialysis Access: PD cath in place in LLQ, dressed     OP PD: Center: [facility] 4 exchanges 1.5-2.5% 2.8 liters 1.5 hr dwell time. No daytime fill. EDW 74 kg.    Assessment/Plan:   S/P Total parathyroidectomy with autotransplantation of parathyroidectomy 05/31/2022. Has continued hungry bones syndrome. Ca++ is down to 4.7 now. Although he is not very symptomatic, these low Ca++ levels are dangerous and we recommend hospital admission until Ca++ is at least over 6.5- 7.   Renal function panel bid for now. please restart home meds of po calcitriol at 2.0 mcg bid. Pt did not tolerate CaCO3 d/t diarrhea last admit so we changed to calcium citrate '800mg'$  elemental calcium tid between meals. However, pt was dc'd home shortly thereafter.   ESRD -  Continue CCPD on schedule. Will use mix of 1.5% and 2.5% tonight.   Hypertension/volume  -last BP close to goal.  Close to OP EDW. Volume seems OK. Continue OP PD Rx.   Anemia  - HGB 8.6. Was 10.6 05/27/2022. Follow labs. Give Aranesp 60 mcg SQ today. Tsat 17% - will give dose of iron today.   Metabolic bone disease -  Labs pending. Holding binders at present until labs are resulted.  Nutrition - K+ at goal. Renal diet with protein supplements.     Kelly Splinter MD [facility] 06/08/2022, 4:37 PM  Recent Labs  Lab 06/04/22 0604 06/04/22 1214 06/08/22 1332  HGB  --   --  9.2*  ALBUMIN 2.3* 2.3* 2.5*  CALCIUM 5.3* 5.7* 4.7*  PHOS 7.1* 6.8*  --   CREATININE 15.97* 16.18* 17.46*  K 3.7 3.6 4.4    Recent Labs  Lab 06/02/22 1144  IRON 23*  TIBC 134*  FERRITIN 731*   Inpatient medications:

## 2022-06-09 DIAGNOSIS — N186 End stage renal disease: Secondary | ICD-10-CM | POA: Diagnosis not present

## 2022-06-09 DIAGNOSIS — N2581 Secondary hyperparathyroidism of renal origin: Secondary | ICD-10-CM | POA: Diagnosis not present

## 2022-06-09 LAB — RENAL FUNCTION PANEL
Albumin: 2.6 g/dL — ABNORMAL LOW (ref 3.5–5.0)
Albumin: 2.3 g/dL — ABNORMAL LOW (ref 3.5–5.0)
Anion gap: 16 — ABNORMAL HIGH (ref 5–15)
Anion gap: 13 (ref 5–15)
BUN: 54 mg/dL — ABNORMAL HIGH (ref 6–20)
BUN: 55 mg/dL — ABNORMAL HIGH (ref 6–20)
CO2: 23 mmol/L (ref 22–32)
CO2: 23 mmol/L (ref 22–32)
Calcium: 5.5 mg/dL — CL (ref 8.9–10.3)
Calcium: 6.7 mg/dL — ABNORMAL LOW (ref 8.9–10.3)
Chloride: 99 mmol/L (ref 98–111)
Chloride: 101 mmol/L (ref 98–111)
Creatinine, Ser: 15.44 mg/dL — ABNORMAL HIGH (ref 0.61–1.24)
Creatinine, Ser: 16.07 mg/dL — ABNORMAL HIGH (ref 0.61–1.24)
GFR, Estimated: 4 mL/min — ABNORMAL LOW (ref >60)
GFR, Estimated: 4 mL/min — ABNORMAL LOW (ref >60)
Glucose, Bld: 84 mg/dL (ref 70–99)
Glucose, Bld: 93 mg/dL (ref 70–99)
Phosphorus: 6.1 mg/dL — ABNORMAL HIGH (ref 2.5–4.6)
Phosphorus: 6 mg/dL — ABNORMAL HIGH (ref 2.5–4.6)
Potassium: 4 mmol/L (ref 3.5–5.1)
Potassium: 4.5 mmol/L (ref 3.5–5.1)
Sodium: 138 mmol/L (ref 135–145)
Sodium: 137 mmol/L (ref 135–145)

## 2022-06-09 LAB — CBC
HCT: 34 % — ABNORMAL LOW (ref 39.0–52.0)
Hemoglobin: 10.2 g/dL — ABNORMAL LOW (ref 13.0–17.0)
MCH: 27.4 pg (ref 26.0–34.0)
MCHC: 30 g/dL (ref 30.0–36.0)
MCV: 91.4 fL (ref 80.0–100.0)
Platelets: 332 10*3/uL (ref 150–400)
RBC: 3.72 MIL/uL — ABNORMAL LOW (ref 4.22–5.81)
RDW: 14.9 % (ref 11.5–15.5)
WBC: 6.4 10*3/uL (ref 4.0–10.5)
nRBC: 0 % (ref 0.0–0.2)

## 2022-06-09 MED ORDER — SODIUM CHLORIDE 0.9 % IV SOLN
3.0000 g | Freq: Four times a day (QID) | INTRAVENOUS | Status: DC
  Administered 2022-06-09 – 2022-06-11 (×9): 3 g via INTRAVENOUS
  Filled 2022-06-09 (×12): qty 30

## 2022-06-09 MED ORDER — DELFLEX-LC/1.5% DEXTROSE 344 MOSM/L IP SOLN: INTRAPERITONEAL | Status: DC

## 2022-06-09 MED ORDER — GENTAMICIN SULFATE 0.1 % EX CREA
1.0000 | TOPICAL_CREAM | Freq: Every day | CUTANEOUS | Status: DC
  Administered 2022-06-09 – 2022-06-11 (×3): 1 via TOPICAL
  Filled 2022-06-09 (×2): qty 15

## 2022-06-09 NOTE — Progress Notes (Signed)
    -  Unknown/unable to determine   Armandina Gemma, MD [facility] [place] practice Office: [phone]

## 2022-06-09 NOTE — Progress Notes (Signed)
     History only of osteomyelitis   Armandina Gemma, MD [facility] [place] practice Office: [phone]

## 2022-06-09 NOTE — Progress Notes (Signed)
Post CCPD Vitals - Post weight 75.3kg   06/09/22 0610  Vitals  Temp 98.7 F (37.1 C)  Temp Source Oral  BP (!) 149/101  BP Location Left Arm  BP Method Automatic  Patient Position (if appropriate) Lying  Pulse Rate 82  Pulse Rate Source Dinamap  Oxygen Therapy  SpO2 100 %  O2 Device Room Air  Pulse Oximetry Type Intermittent

## 2022-06-09 NOTE — Progress Notes (Signed)
     Other condition (please specify)   ESRD on peritoneal dialysis.  AKI is not an appropriate diagnosis.  Armandina Gemma, MD [facility] [place] practice Office: [phone]

## 2022-06-09 NOTE — Progress Notes (Signed)
[facility] Progress Note   Subjective: Not very talkative, unhappy about being back in hospital. Denies symptoms of tetany, denies abdominal pain.      Objective Vitals:   06/08/22 2019 06/09/22 0312 06/09/22 0610 06/09/22 0838  BP: (!) 154/106 133/87 (!) 149/101 (!) 148/96  Pulse: 77 87 82 88  Resp: '19 18 18 17  '$ Temp: 98.7 F (37.1 C) 99.9 F (37.7 C) 98.7 F (37.1 C) 98.2 F (36.8 C)  TempSrc: Oral Oral Oral   SpO2: 100% 98% 100% 97%  Weight: 75.9 kg     Height: '6\' 3"'$  (1.905 m)      PHYSICAL EXAM:  General: Well developed, well nourished, in no acute distress. Neck: Supple. JVD not elevated. Incision with adhesive glue intact.  Lungs: CTAB A/P Heart: S1,S2 no M/R/G.  Abdomen: NT, NABS.  Lower extremities:without edema or ischemic changes, no open wounds, Incision with adhesive glue R forearm intact Neuro: Alert and oriented X 3. Moves all extremities spontaneously. Dialysis Access: PD catheter LLQ drsg dry intact Failed R AVF.     Additional Objective Labs: Basic Metabolic Panel: Recent Labs  Lab 06/04/22 0604 06/04/22 1214 06/08/22 1332 06/08/22 1810 06/09/22 0631  NA 135 134* 138  --  138  K 3.7 3.6 4.4  --  4.0  CL 100 99 100  --  99  CO2 20* 21* 22  --  23  GLUCOSE 80 76 94  --  84  BUN 59* 60* 62*  --  54*  CREATININE 15.97* 16.18* 17.46* 17.46* 15.44*  CALCIUM 5.3* 5.7* 4.7*  --  5.5*  PHOS 7.1* 6.8*  --   --  6.1*   Liver Function Tests: Recent Labs  Lab 06/04/22 1214 06/08/22 1332 06/09/22 0631  AST  --  14*  --   ALT  --  6  --   ALKPHOS  --  351*  --   BILITOT  --  0.4  --   PROT  --  5.8*  --   ALBUMIN 2.3* 2.5* 2.6*   No results for input(s): "LIPASE", "AMYLASE" in the last 168 hours. CBC: Recent Labs  Lab 06/08/22 1332 06/08/22 1810 06/09/22 0631  WBC 8.8 7.4 6.4  NEUTROABS 6.2  --   --   HGB 9.2* 8.8* 10.2*  HCT 30.5* 30.3* 34.0*  MCV 92.7 95.6 91.4  PLT 297 249 332   Blood Culture    Component Value  Date/Time   SDES BLOOD LEFT FOREARM 11/14/2020 2320   SPECREQUEST  11/14/2020 2320    BOTTLES DRAWN AEROBIC ONLY Blood Culture adequate volume   CULT  11/14/2020 2320    NO GROWTH 5 DAYS Performed at [facility], [place] [address] [place],  [address]    REPTSTATUS 11/20/2020 FINAL 11/14/2020 2320    Cardiac Enzymes: No results for input(s): "CKTOTAL", "CKMB", "CKMBINDEX", "TROPONINI" in the last 168 hours. CBG: No results for input(s): "GLUCAP" in the last 168 hours. Iron Studies: No results for input(s): "IRON", "TIBC", "TRANSFERRIN", "FERRITIN" in the last 72 hours. '@lablastinr3'$ @ Studies/Results: No results found. Medications:  sodium chloride     calcium gluconate 3 g (06/09/22 1219)   dialysis solution 1.5% low-MG/low-CA     dialysis solution 2.5% low-MG/low-CA      calcitRIOL  2 mcg Oral BID   calcium citrate  800 mg of elemental calcium Oral Q8H   gentamicin cream  1 Application Topical Daily   heparin  5,000 Units Subcutaneous Q8H   multivitamin  1 tablet Oral Daily   sodium chloride flush  3 mL Intravenous Q12H     OP PD: Center: [facility] 4 exchanges 1.5-2.5% 2.8 liters 1.5 hr dwell time. No daytime fill. EDW 74 kg.    Assessment/Plan:   S/P Total parathyroidectomy with autotransplantation of parathyroidectomy 05/31/2022. Has continued hungry bones syndrome. Ca++ is down to 4.7 now. Although he is not very symptomatic, these low Ca++ levels are dangerous and we recommend hospital admission until serum Ca++ is > 7. Renal function panel bid for now. Will resume these meds for repleting Ca in the body> Calcitriol 2.0 mcg bid.  Ca citrate '800mg'$  elemental calcium tid between meals (did not tolerate po CaCO3 last admit) IV Ca gluconate 3 gm q 6 hrs until serum Ca++ > 7  ESRD -  Continue CCPD on schedule. Will use mix of 1.5% and 2.5% tonight.   Hypertension/volume  -last BP close to goal.  Close to OP EDW. Volume seems OK. Continue OP PD Rx.    Anemia  - HGB 10.2. Follow HGB.   Metabolic bone disease - PO4 6.1. Continue binders.   Nutrition - K+ at goal. Renal diet with protein supplements.   Donevin Sainsbury H. Chelsae Zanella NP-C 06/09/2022, 1:03 PM  [employer] [phone]

## 2022-06-09 NOTE — Progress Notes (Signed)
PROGRESS NOTE        PATIENT DETAILS Name: Micheal Bean Age: 37 y.o. Sex: male Date of Birth: Feb 23, 1986 Admit Date: 06/08/2022 Admitting Physician Evalee Mutton Kristeen Mans, MD [id number], Clyde Canterbury, MD  Brief Summary: Patient is a 37 y.o.  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  Significant events: 3/5>> admit to [facility]  Significant studies: None  Significant microbiology data: None  Procedures: None  Consults: Neurology  Subjective: Lying comfortably in bed-denies any chest pain or shortness of breath.  Denies any muscle twitching/cramps  Objective: Vitals: Blood pressure (!) 148/96, pulse 88, temperature 98.2 F (36.8 C), resp. rate 17, height '6\' 3"'$  (1.905 m), weight 75.9 kg, SpO2 97 %.   Exam: Gen Exam:Alert awake-not in any distress HEENT:atraumatic, normocephalic Chest: B/L clear to auscultation anteriorly CVS:S1S2 regular Abdomen:soft non tender, non distended Extremities:no edema Neurology: Non focal Skin: no rash  Pertinent Labs/Radiology:    Latest Ref Rng & Units 06/09/2022    6:31 AM 06/08/2022    6:10 PM 06/08/2022    1:32 PM  CBC  WBC 4.0 - 10.5 K/uL 6.4  7.4  8.8   Hemoglobin 13.0 - 17.0 g/dL 10.2  8.8  9.2   Hematocrit 39.0 - 52.0 % 34.0  30.3  30.5   Platelets 150 - 400 K/uL 332  249  297     Lab Results  Component Value Date   NA 138 06/09/2022   K 4.0 06/09/2022   CL 99 06/09/2022   CO2 23 06/09/2022      Assessment/Plan: Hypocalcemia-s/p total parathyroidectomy 2/26 Suspicion for hungry bone syndrome Remains asymptomatic Calcium levels improving Continue IV calcium gluconate 2 g every 6 hours until serum calcium> 7, calcium citrate 800 mg 3 times daily, calcitriol 2 mcg twice daily Nephrology following.   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   Weight as of this encounter: 75.9 kg.   Code status:   Code Status: Full Code   DVT Prophylaxis: heparin injection 5,000 Units Start: 06/08/22 2200   Family Communication: None at bedside   Disposition Plan: Status is: Inpatient Remains inpatient appropriate because: Severity of illness   Planned Discharge Destination:Home-in the next 1-2 days.   Diet: Diet Order             Diet renal with fluid restriction Fluid restriction: 1200 mL Fluid; Room service appropriate? Yes; Fluid consistency: Thin  Diet effective now                     Antimicrobial agents: Anti-infectives (From admission, onward)    None        MEDICATIONS: Scheduled Meds:  calcitRIOL  2 mcg Oral BID   calcium citrate  800 mg of elemental calcium Oral Q8H   gentamicin cream  1 Application Topical Daily   heparin  5,000 Units Subcutaneous Q8H   multivitamin  1 tablet  Oral Daily   sodium chloride flush  3 mL Intravenous Q12H   Continuous Infusions:  sodium chloride     calcium gluconate     dialysis solution 1.5% low-MG/low-CA     dialysis solution 2.5% low-MG/low-CA     PRN Meds:.sodium chloride, acetaminophen **OR** acetaminophen, albuterol, heparin sodium (porcine) 1,500 Units in dialysis solution 1.5% low-MG/low-CA 3,000 mL dialysis solution, heparin sodium (porcine) 1,500 Units in dialysis solution 2.5% low-MG/low-CA 3,000 mL dialysis solution, hydrALAZINE, ondansetron **OR** ondansetron (ZOFRAN) IV, oxyCODONE, sodium chloride flush   I have personally reviewed following labs and imaging studies  LABORATORY  DATA: CBC: Recent Labs  Lab 06/08/22 1332 06/08/22 1810 06/09/22 0631  WBC 8.8 7.4 6.4  NEUTROABS 6.2  --   --   HGB 9.2* 8.8* 10.2*  HCT 30.5* 30.3* 34.0*  MCV 92.7 95.6 91.4  PLT 297 249 [id number]    Basic Metabolic Panel: Recent Labs  Lab 06/03/22 1934 06/04/22 0135 06/04/22 0604 06/04/22 1214 06/08/22 1332 06/08/22 1810 06/09/22 0631  NA 134* 133* 135 134* 138  --  138  K 4.4 3.9 3.7 3.6 4.4  --  4.0  CL 99 98 100 99 100  --  99  CO2 21* 20* 20* 21* 22  --  23  GLUCOSE 109* 106* 80 76 94  --  84  BUN 69* 64* 59* 60* 62*  --  54*  CREATININE 16.68* 16.08* 15.97* 16.18* 17.46* 17.46* 15.44*  CALCIUM 5.7* 5.6* 5.3* 5.7* 4.7*  --  5.5*  MG 2.0  --   --   --   --   --   --   PHOS 7.8* 7.2* 7.1* 6.8*  --   --  6.1*    GFR: Estimated Creatinine Clearance: 7.2 mL/min (A) (by C-G formula based on SCr of 15.44 mg/dL (H)).  Liver Function Tests: Recent Labs  Lab 06/04/22 0135 06/04/22 0604 06/04/22 1214 06/08/22 1332 06/09/22 0631  AST  --   --   --  14*  --   ALT  --   --   --  6  --   ALKPHOS  --   --   --  351*  --   BILITOT  --   --   --  0.4  --   PROT  --   --   --  5.8*  --   ALBUMIN 2.4* 2.3* 2.3* 2.5* 2.6*   No results for input(s): "LIPASE", "AMYLASE" in the last 168 hours. No results for input(s): "AMMONIA" in the last 168 hours.  Coagulation Profile: No results for input(s): "INR", "PROTIME" in the last 168 hours.  Cardiac Enzymes: No results for input(s): "CKTOTAL", "CKMB", "CKMBINDEX", "TROPONINI" in the last 168 hours.  BNP (last 3 results) No results for input(s): "PROBNP" in the last 8760 hours.  Lipid Profile: No results for input(s): "CHOL", "HDL", "LDLCALC", "TRIG", "CHOLHDL", "LDLDIRECT" in the last 72 hours.  Thyroid Function Tests: No results for input(s): "TSH", "T4TOTAL", "FREET4", "T3FREE", "THYROIDAB" in the last 72 hours.  Anemia Panel: No results for input(s): "VITAMINB12", "FOLATE", "FERRITIN", "TIBC", "IRON", "RETICCTPCT" in  the last 72 hours.  Urine analysis:    Component Value Date/Time   COLORURINE STRAW (A) 06/08/2022 1323   APPEARANCEUR CLEAR 06/08/2022 1323   LABSPEC 1.005 06/08/2022 1323   PHURINE 7.0 06/08/2022 1323   GLUCOSEU 50 (A) 06/08/2022 1323   HGBUR NEGATIVE 06/08/2022 1323   BILIRUBINUR NEGATIVE 06/08/2022 1323   KETONESUR NEGATIVE 06/08/2022 1323   PROTEINUR 100 (  A) 06/08/2022 1323   UROBILINOGEN 0.2 06/25/2012 1826   NITRITE NEGATIVE 06/08/2022 1323   LEUKOCYTESUR SMALL (A) 06/08/2022 1323    Sepsis Labs: Lactic Acid, Venous No results found for: "LATICACIDVEN"  MICROBIOLOGY: No results found for this or any previous visit (from the past 240 hour(s)).  RADIOLOGY STUDIES/RESULTS: No results found.   LOS: 1 day   Oren Binet, MD  [facility]    To contact the attending provider between 7A-7P or the covering provider during after hours 7P-7A, please log into the web site [email or web address] and access using universal [place] password for that web site. If you do not have the password, please call the hospital operator.  06/09/2022, 9:46 AM

## 2022-06-09 NOTE — Progress Notes (Signed)
Pre CCPDVitals Pre [id number]  06/09/22 1800  Vitals  Temp 98.4 F (36.9 C)  Temp Source Oral  BP 137/68  MAP (mmHg) 98  BP Location Left Arm  BP Method Automatic  Patient Position (if appropriate) Lying  Pulse Rate 85  Pulse Rate Source Monitor  Oxygen Therapy  SpO2 98 %  O2 Device Room Air

## 2022-06-09 NOTE — Progress Notes (Cosign Needed)
  Transition of Care [facility]) Screening Note   Patient Details  Name: Micheal Bean Date of Birth: [birth year]   Transition of Care [facility]) CM/SW Contact:    Tom-Johnson, Renea Ee, RN Phone Number: 06/09/2022, 2:59 PM  Patient is admitted for Hypercalcemia in the setting of Hungry Bone Syndrome. Patient had a recent total Parathyroidectomy, Thyroidectomy with Autotransplantation of Parathyroidectomy on 05/31/2022. On PD by [facility] therapies. From home with his mother. On disability. Independent with care prior to admission and able to drive self. Does not have DME's at home.  PCP is [name], MD and uses [facility] on [address].      Transition of Care Department [facility]) has reviewed patient and no TOC needs or recommendations have been identified at this time. TOC will continue to monitor patient advancement through interdisciplinary progression rounds. If new patient transition needs arise, please place a TOC consult.

## 2022-06-10 DIAGNOSIS — N186 End stage renal disease: Secondary | ICD-10-CM | POA: Diagnosis not present

## 2022-06-10 LAB — RENAL FUNCTION PANEL
Albumin: 2.3 g/dL — ABNORMAL LOW (ref 3.5–5.0)
Albumin: 2.3 g/dL — ABNORMAL LOW (ref 3.5–5.0)
Anion gap: 12 (ref 5–15)
Anion gap: 16 — ABNORMAL HIGH (ref 5–15)
BUN: 47 mg/dL — ABNORMAL HIGH (ref 6–20)
BUN: 50 mg/dL — ABNORMAL HIGH (ref 6–20)
CO2: 25 mmol/L (ref 22–32)
CO2: 22 mmol/L (ref 22–32)
Calcium: 6.2 mg/dL — CL (ref 8.9–10.3)
Calcium: 6.2 mg/dL — CL (ref 8.9–10.3)
Chloride: 102 mmol/L (ref 98–111)
Chloride: 102 mmol/L (ref 98–111)
Creatinine, Ser: 15.04 mg/dL — ABNORMAL HIGH (ref 0.61–1.24)
Creatinine, Ser: 16.1 mg/dL — ABNORMAL HIGH (ref 0.61–1.24)
GFR, Estimated: 4 mL/min — ABNORMAL LOW (ref >60)
GFR, Estimated: 4 mL/min — ABNORMAL LOW (ref >60)
Glucose, Bld: 125 mg/dL — ABNORMAL HIGH (ref 70–99)
Glucose, Bld: 113 mg/dL — ABNORMAL HIGH (ref 70–99)
Phosphorus: 5 mg/dL — ABNORMAL HIGH (ref 2.5–4.6)
Phosphorus: 5.5 mg/dL — ABNORMAL HIGH (ref 2.5–4.6)
Potassium: 3.7 mmol/L (ref 3.5–5.1)
Potassium: 4.2 mmol/L (ref 3.5–5.1)
Sodium: 139 mmol/L (ref 135–145)
Sodium: 140 mmol/L (ref 135–145)

## 2022-06-10 MED ORDER — CALCIUM CITRATE 950 (200 CA) MG PO TABS
1400.0000 mg | ORAL_TABLET | Freq: Three times a day (TID) | ORAL | Status: DC
  Administered 2022-06-10 – 2022-06-11 (×4): 1400 mg via ORAL
  Filled 2022-06-10 (×5): qty 7

## 2022-06-10 NOTE — Progress Notes (Signed)
Pt receives PD care through [facility] home therapies dept. Will contact [place] at d/c once d/c date is confirmed.   Melven Sartorius Renal Navigator [phone]

## 2022-06-10 NOTE — Progress Notes (Signed)
PostCCPD Vitals    06/10/22 0530  Vitals  Temp 98.1 F (36.7 C)  Temp Source Oral  BP (!) 143/68  BP Location Left Arm  BP Method Automatic  Patient Position (if appropriate) Lying  Pulse Rate 88  Oxygen Therapy  SpO2 100 %  O2 Device Room Air

## 2022-06-10 NOTE — Progress Notes (Signed)
PD note---      I started the PD tx at approx 1853----no issues with draining or cycling to the first drain--  Vital Signs ae as follows--98.8-oral                                           146/90                                           106                                            98                                                                          95%                                            Room air                                            16                                             Monitor--NSR  PD access site is WNL--DSD change-gentamicin cream applied--

## 2022-06-10 NOTE — Plan of Care (Signed)
  Problem: Education: Goal: Knowledge of disease and its progression will improve Outcome: Completed/Met Goal: Individualized Educational Video(s) Outcome: Not Applicable

## 2022-06-10 NOTE — Progress Notes (Signed)
PROGRESS NOTE        PATIENT DETAILS Name: Micheal Bean Age: 37 y.o. Sex: male Date of Birth: [birth year] Admit Date: 06/08/2022 Admitting Physician Evalee Mutton Kristeen Mans, MD [id number], Clyde Canterbury, MD  Brief Summary: Patient is a 37 y.o.  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  Significant events: 3/5>> admit to [facility]  Significant studies: None  Significant microbiology data: None  Procedures: None  Consults: Neurology  Subjective: Twitching/cramps.  Lying comfortably.  Objective: Vitals: Blood pressure (!) 139/95, pulse 91, temperature 99.1 F (37.3 C), temperature source Oral, resp. rate 18, height '6\' 3"'$  (1.905 m), weight 75.9 kg, SpO2 97 %.   Exam: Gen Exam:Alert awake-not in any distress HEENT:atraumatic, normocephalic Chest: B/L clear to auscultation anteriorly CVS:S1S2 regular Abdomen:soft non tender, non distended Extremities:no edema Neurology: Non focal Skin: no rash  Pertinent Labs/Radiology:    Latest Ref Rng & Units 06/09/2022    6:31 AM 06/08/2022    6:10 PM 06/08/2022    1:32 PM  CBC  WBC 4.0 - 10.5 K/uL 6.4  7.4  8.8   Hemoglobin 13.0 - 17.0 g/dL 10.2  8.8  9.2   Hematocrit 39.0 - 52.0 % 34.0  30.3  30.5   Platelets 150 - 400 K/uL 332  249  297     Lab Results  Component Value Date   NA 139 06/10/2022   K 3.7 06/10/2022   CL 102 06/10/2022   CO2 25 06/10/2022      Assessment/Plan: Hypocalcemia-s/p total parathyroidectomy 2/26 Suspicion for hungry bone syndrome Remains asymptomatic Calcium level slowly proving Remains on IV calcium gluconate/calcium citrate/calcitriol Nephrology following with plans to discharge home once calcium is >7   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   Weight as of this encounter: 75.9 kg.   Code status:   Code Status: Full Code   DVT Prophylaxis: heparin injection 5,000 Units Start: 06/08/22 2200   Family Communication: None at bedside   Disposition Plan: Status is: Inpatient Remains inpatient appropriate because: Severity of illness   Planned Discharge Destination:Home-in the next 1-2 days.   Diet: Diet Order             Diet renal with fluid restriction Fluid restriction: 1200 mL Fluid; Room service appropriate? Yes; Fluid consistency: Thin  Diet effective now                     Antimicrobial agents: Anti-infectives (From admission, onward)    None        MEDICATIONS: Scheduled Meds:  calcitRIOL  2 mcg Oral BID   calcium citrate  1,400 mg of elemental calcium Oral Q8H   gentamicin cream  1 Application Topical Daily   heparin  5,000 Units Subcutaneous Q8H   multivitamin  1 tablet Oral Daily   sodium chloride flush  3 mL Intravenous Q12H   Continuous Infusions:  sodium  chloride     calcium gluconate 3 g (06/10/22 0537)   dialysis solution 1.5% low-MG/low-CA     dialysis solution 1.5% low-MG/low-CA     dialysis solution 2.5% low-MG/low-CA     PRN Meds:.sodium chloride, acetaminophen **OR** acetaminophen, albuterol, heparin sodium (porcine) 1,500 Units in dialysis solution 1.5% low-MG/low-CA 3,000 mL dialysis solution, heparin sodium (porcine) 1,500 Units in dialysis solution 2.5% low-MG/low-CA 3,000 mL dialysis solution, hydrALAZINE, ondansetron **OR** ondansetron (ZOFRAN) IV, oxyCODONE, sodium chloride flush   I have personally reviewed following labs and imaging studies  LABORATORY DATA: CBC: Recent  Labs  Lab 06/08/22 1332 06/08/22 1810 06/09/22 0631  WBC 8.8 7.4 6.4  NEUTROABS 6.2  --   --   HGB 9.2* 8.8* 10.2*  HCT 30.5* 30.3* 34.0*  MCV 92.7 95.6 91.4  PLT 297 249 332     Basic Metabolic Panel: Recent Labs  Lab 06/03/22 1934 06/04/22 0135 06/04/22 0604 06/04/22 1214 06/08/22 1332 06/08/22 1810 06/09/22 0631 06/09/22 1825 06/10/22 0441  NA 134*   < > 135 134* 138  --  138 137 139  K 4.4   < > 3.7 3.6 4.4  --  4.0 4.5 3.7  CL 99   < > 100 99 100  --  99 101 102  CO2 21*   < > 20* 21* 22  --  '23 23 25  '$ GLUCOSE 109*   < > 80 76 94  --  84 93 125*  BUN 69*   < > 59* 60* 62*  --  54* 55* 47*  CREATININE 16.68*   < > 15.97* 16.18* 17.46* 17.46* 15.44* 16.07* 15.04*  CALCIUM 5.7*   < > 5.3* 5.7* 4.7*  --  5.5* 6.7* 6.2*  MG 2.0  --   --   --   --   --   --   --   --   PHOS 7.8*   < > 7.1* 6.8*  --   --  6.1* 6.0* 5.0*   < > = values in this interval not displayed.     GFR: Estimated Creatinine Clearance: 7.4 mL/min (A) (by C-G formula based on SCr of 15.04 mg/dL (H)).  Liver Function Tests: Recent Labs  Lab 06/04/22 1214 06/08/22 1332 06/09/22 0631 06/09/22 1825 06/10/22 0441  AST  --  14*  --   --   --   ALT  --  6  --   --   --   ALKPHOS  --  351*  --   --   --   BILITOT  --  0.4  --   --   --   PROT  --  5.8*  --   --   --   ALBUMIN 2.3* 2.5* 2.6* 2.3* 2.3*    No results for input(s): "LIPASE", "AMYLASE" in the last 168 hours. No results for input(s): "AMMONIA" in the last 168 hours.  Coagulation Profile: No results for input(s): "INR", "PROTIME" in the last 168 hours.  Cardiac Enzymes: No results for input(s): "CKTOTAL", "CKMB", "CKMBINDEX", "TROPONINI" in the last 168 hours.  BNP (last 3 results) No results for input(s): "PROBNP" in the last 8760 hours.  Lipid Profile: No results for input(s): "CHOL", "HDL", "LDLCALC", "TRIG", "CHOLHDL", "LDLDIRECT" in the last 72 hours.  Thyroid Function Tests: No results for input(s): "TSH", "T4TOTAL",  "FREET4", "T3FREE", "THYROIDAB" in the last 72 hours.  Anemia Panel: No results for input(s): "VITAMINB12", "FOLATE", "FERRITIN", "TIBC", "IRON", "RETICCTPCT" in the last 72 hours.  Urine  analysis:    Component Value Date/Time   COLORURINE STRAW (A) 06/08/2022 1323   APPEARANCEUR CLEAR 06/08/2022 1323   LABSPEC 1.005 06/08/2022 1323   PHURINE 7.0 06/08/2022 1323   GLUCOSEU 50 (A) 06/08/2022 1323   HGBUR NEGATIVE 06/08/2022 1323   BILIRUBINUR NEGATIVE 06/08/2022 1323   KETONESUR NEGATIVE 06/08/2022 1323   PROTEINUR 100 (A) 06/08/2022 1323   UROBILINOGEN 0.2 06/25/2012 1826   NITRITE NEGATIVE 06/08/2022 1323   LEUKOCYTESUR SMALL (A) 06/08/2022 1323    Sepsis Labs: Lactic Acid, Venous No results found for: "LATICACIDVEN"  MICROBIOLOGY: No results found for this or any previous visit (from the past 240 hour(s)).  RADIOLOGY STUDIES/RESULTS: No results found.   LOS: 2 days   Oren Binet, MD  [facility]    To contact the attending provider between 7A-7P or the covering provider during after hours 7P-7A, please log into the web site [email or web address] and access using universal [place] password for that web site. If you do not have the password, please call the hospital operator.  06/10/2022, 10:51 AM

## 2022-06-10 NOTE — Progress Notes (Addendum)
[facility] Progress Note   Subjective: Seen in room. Sleeping. BP well controlled. No symptoms   Objective Vitals:   06/10/22 0437 06/10/22 0530 06/10/22 0700 06/10/22 0953  BP: (!) 147/91 (!) 143/68  (!) 139/95  Pulse: 91 88  91  Resp: '18 13  18  '$ Temp: 98.5 F (36.9 C) 98.1 F (36.7 C)  99.1 F (37.3 C)  TempSrc: Oral Oral  Oral  SpO2: 100% 100%  97%  Weight:   79.5 kg   Height:       PHYSICAL EXAM:  General: Well developed, well nourished, in no acute distress. Neck: Supple. JVD not elevated. Incision with adhesive glue intact.  Lungs: CTAB A/P Heart: S1,S2 no M/R/G.  Abdomen: NT, NABS.  Lower extremities:without edema or ischemic changes, no open wounds, Incision with adhesive glue R forearm intact Neuro: Alert and oriented X 3. Moves all extremities spontaneously. Dialysis Access: PD catheter LLQ drsg dry intact Failed R AVF.   Additional Objective Labs: Basic Metabolic Panel: Recent Labs  Lab 06/09/22 0631 06/09/22 1825 06/10/22 0441  NA 138 137 139  K 4.0 4.5 3.7  CL 99 101 102  CO2 '23 23 25  '$ GLUCOSE 84 93 125*  BUN 54* 55* 47*  CREATININE 15.44* 16.07* 15.04*  CALCIUM 5.5* 6.7* 6.2*  PHOS 6.1* 6.0* 5.0*   Liver Function Tests: Recent Labs  Lab 06/08/22 1332 06/09/22 0631 06/09/22 1825 06/10/22 0441  AST 14*  --   --   --   ALT 6  --   --   --   ALKPHOS 351*  --   --   --   BILITOT 0.4  --   --   --   PROT 5.8*  --   --   --   ALBUMIN 2.5* 2.6* 2.3* 2.3*   No results for input(s): "LIPASE", "AMYLASE" in the last 168 hours. CBC: Recent Labs  Lab 06/08/22 1332 06/08/22 1810 06/09/22 0631  WBC 8.8 7.4 6.4  NEUTROABS 6.2  --   --   HGB 9.2* 8.8* 10.2*  HCT 30.5* 30.3* 34.0*  MCV 92.7 95.6 91.4  PLT 297 249 332   Blood Culture    Component Value Date/Time   SDES BLOOD LEFT FOREARM 11/14/2020 2320   SPECREQUEST  11/14/2020 2320    BOTTLES DRAWN AEROBIC ONLY Blood Culture adequate volume   CULT  11/14/2020 2320    NO  GROWTH 5 DAYS Performed at [facility], [place] [address] [place], [place] [address]    REPTSTATUS 11/20/2020 FINAL 11/14/2020 2320    Cardiac Enzymes: No results for input(s): "CKTOTAL", "CKMB", "CKMBINDEX", "TROPONINI" in the last 168 hours. CBG: No results for input(s): "GLUCAP" in the last 168 hours. Iron Studies: No results for input(s): "IRON", "TIBC", "TRANSFERRIN", "FERRITIN" in the last 72 hours. '@lablastinr3'$ @ Studies/Results: No results found. Medications:  sodium chloride     calcium gluconate 3 g (06/10/22 1107)   dialysis solution 1.5% low-MG/low-CA     dialysis solution 1.5% low-MG/low-CA     dialysis solution 2.5% low-MG/low-CA      calcitRIOL  2 mcg Oral BID   calcium citrate  1,400 mg of elemental calcium Oral Q8H   gentamicin cream  1 Application Topical Daily   heparin  5,000 Units Subcutaneous Q8H   multivitamin  1 tablet Oral Daily   sodium chloride flush  3 mL Intravenous Q12H     OP PD: Center: [facility] 4 exchanges 1.5-2.5% 2.8 liters 1.5 hr  dwell time. No daytime fill. EDW 74 kg.    Assessment/Plan:   S/P Total parathyroidectomy with autotransplantation of parathyroidectomy 05/31/2022. Has continued hungry bones syndrome. Ca++ was  down to 4.7 on admission. Ca++ levels this low are dangerous and we recommended hospital admission until serum Ca++ is > 7. Renal function panel bid for now.  Cont these medications for repleting Ca in the body> Calcitriol 2.0 mcg bid Ca citrate '800mg'$  --> ^'d to '1400mg'$  elemental calcium tid today  Cont IV Ca gluconate 3 gm q 6 hrs until serum Ca++ > 6.5  ESRD -  Continue CCPD on schedule. Will use mix of 1.5% and 2.5% tonight.   Hypertension/volume  -last BP close to goal.  Close to OP EDW. Volume seems OK. Continue OP PD Rx.   Anemia  - HGB 10.2. Follow HGB.   Metabolic bone disease - PO4 6.1. Continue binders.   Nutrition - K+ at goal. Renal diet with protein supplements.   Rita H. Brown NP-C 06/10/2022,  11:39 AM  [facility] [phone]  Pt seen, examined and agree w A/P as above.  Kelly Splinter  MD 06/10/2022, 12:11 PM

## 2022-06-11 ENCOUNTER — Other Ambulatory Visit (HOSPITAL_COMMUNITY): Payer: Self-pay

## 2022-06-11 LAB — RENAL FUNCTION PANEL
Albumin: 2.5 g/dL — ABNORMAL LOW (ref 3.5–5.0)
Albumin: 2.4 g/dL — ABNORMAL LOW (ref 3.5–5.0)
Anion gap: 18 — ABNORMAL HIGH (ref 5–15)
Anion gap: 16 — ABNORMAL HIGH (ref 5–15)
BUN: 45 mg/dL — ABNORMAL HIGH (ref 6–20)
BUN: 48 mg/dL — ABNORMAL HIGH (ref 6–20)
CO2: 20 mmol/L — ABNORMAL LOW (ref 22–32)
CO2: 23 mmol/L (ref 22–32)
Calcium: 6.4 mg/dL — CL (ref 8.9–10.3)
Calcium: 7.5 mg/dL — ABNORMAL LOW (ref 8.9–10.3)
Chloride: 102 mmol/L (ref 98–111)
Chloride: 99 mmol/L (ref 98–111)
Creatinine, Ser: 14.63 mg/dL — ABNORMAL HIGH (ref 0.61–1.24)
Creatinine, Ser: 15.44 mg/dL — ABNORMAL HIGH (ref 0.61–1.24)
GFR, Estimated: 4 mL/min — ABNORMAL LOW (ref >60)
GFR, Estimated: 4 mL/min — ABNORMAL LOW (ref >60)
Glucose, Bld: 103 mg/dL — ABNORMAL HIGH (ref 70–99)
Glucose, Bld: 79 mg/dL (ref 70–99)
Phosphorus: 4.6 mg/dL (ref 2.5–4.6)
Phosphorus: 5.6 mg/dL — ABNORMAL HIGH (ref 2.5–4.6)
Potassium: 3.7 mmol/L (ref 3.5–5.1)
Potassium: 4.2 mmol/L (ref 3.5–5.1)
Sodium: 140 mmol/L (ref 135–145)
Sodium: 138 mmol/L (ref 135–145)

## 2022-06-11 MED ORDER — CITRACAL MAXIMUM PLUS PO TABS: 4.0000 | ORAL_TABLET | Freq: Three times a day (TID) | ORAL | 0 refills | Status: DC

## 2022-06-11 MED ORDER — GENTAMICIN SULFATE 0.1 % EX CREA
1.0000 | TOPICAL_CREAM | Freq: Every day | CUTANEOUS | Status: DC
  Filled 2022-06-11: qty 15

## 2022-06-11 MED ORDER — DELFLEX-LC/2.5% DEXTROSE 394 MOSM/L IP SOLN: INTRAPERITONEAL | Status: DC

## 2022-06-11 MED ORDER — CALCITRIOL 0.5 MCG PO CAPS: 2.0000 ug | ORAL_CAPSULE | Freq: Two times a day (BID) | ORAL | 0 refills | Status: DC

## 2022-06-11 MED ORDER — CALCIUM CITRATE 950 (200 CA) MG PO TABS: 1400.0000 mg | ORAL_TABLET | Freq: Three times a day (TID) | ORAL | 0 refills | Status: DC

## 2022-06-11 MED ORDER — DELFLEX-LC/1.5% DEXTROSE 344 MOSM/L IP SOLN: INTRAPERITONEAL | Status: DC

## 2022-06-11 MED ORDER — NONFORMULARY OR COMPOUNDED ITEM: 0 refills | Status: DC

## 2022-06-11 NOTE — Care Management Important Message (Signed)
Important Message  Patient Details  Name: Micheal Bean MRN: [id number] Date of Birth: [birth year]   Medicare Important Message Given:  Yes     Shelda Altes 06/11/2022, 3:42 PM

## 2022-06-11 NOTE — Procedures (Signed)
PD note:  Treatment completed as ordered.  Patient reported that no issues occurred during the night.  Total UF 890 ml

## 2022-06-11 NOTE — Discharge Summary (Addendum)
PATIENT DETAILS Name: Micheal Bean Age: 37 y.o. Sex: male Date of Birth: 01/20/1986 MRN: [id number]. Admitting Physician: Jonetta Osgood, MD [id number], Clyde Canterbury, MD  Admit Date: 06/08/2022 Discharge date: 06/11/2022  Recommendations for Outpatient Follow-up:  Follow up with PCP in 1-2 weeks Please obtain CMP/CBC in one week Please ensure follow up with nephrology  Admitted From:  Home  Disposition: Home   Discharge Condition: good  CODE STATUS:   Code Status: Full Code   Diet recommendation:  Diet Order             Diet - low sodium heart healthy           Diet renal with fluid restriction Fluid restriction: 1200 mL Fluid; Room service appropriate? Yes; Fluid consistency: Thin  Diet effective now                    Brief Summary: Patient is a 37 y.o.  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   Significant events: 3/5>> admit to [facility]   Significant studies: None   Significant microbiology data: None   Procedures: None   Consults: Neurology    Brief Hospital Course: Hypocalcemia-s/p total parathyroidectomy 2/26 Suspicion for hungry bone syndrome Remains asymptomatic Calcium levels have improved but essentially plateaued-still unchanged over the past 2 days. He was treated with IV Ca Gluconate, Oral calcium citrate and calcitriol Discussed with Dr Jonnie Finner, ok to discharge today-patient will get electrolytes checked twice weekly as a outpatient.  Per renal recommendations, patient being discharged on Calcitriol 2 mcg bid and cal citrate 1400 mg tid  Addendum: Renal MD-Dr Schertz-now recommending Citracal by [facility] = Calc citrate 650 mg per 2 caplets , #180, take 4 tid (between meals if possible) . I have called [facility] and cancelled the prior Calcium Citrate  script.   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   Weight as of this encounter: 78.3 kg.     Discharge Diagnoses:  Principal Problem:   Hypocalcemia Active Problems:   End stage renal disease ([place])   Hyperparathyroidism, secondary ([place])   Secondary hyperparathyroidism of renal origin [facility])   Discharge Instructions:  Activity:  As tolerated    Discharge Instructions     Call MD for:  extreme fatigue   Complete by: As directed    Call MD for:  persistant dizziness or light-headedness   Complete by: As directed    Call MD for:  persistant nausea and vomiting   Complete by: As directed    Call MD for:  severe uncontrolled pain   Complete by: As directed    Diet - low sodium heart healthy   Complete by: As directed    Discharge instructions   Complete by: As directed    Follow with Primary MD  [name], MD in 1-2 weeks  Follow with [facility]-their office will call you for appt and blood work  Please get a complete blood count and chemistry panel checked by your Primary MD at your next visit,  and again as instructed by your Primary MD.  Get Medicines reviewed and adjusted: Please take all your medications with you for your next visit with your Primary MD  Laboratory/radiological data: Please request your Primary MD to go over all hospital tests and procedure/radiological results at the follow up, please ask your Primary MD to get all Hospital records sent to his/her office.  In some cases, they will be blood work, cultures and biopsy results pending at the time of your discharge. Please request that your primary care M.D.  follows up on these results.  Also Note the following: If you experience worsening of your admission symptoms, develop shortness of breath, life threatening emergency, suicidal or homicidal thoughts you must seek medical attention immediately by calling 911 or calling your MD immediately  if symptoms less severe.  You must read complete instructions/literature along with all the possible adverse reactions/side effects for all the Medicines you take and that have been prescribed to you. Take any new Medicines after you have completely understood and accpet all the possible adverse reactions/side effects.   Do not drive when taking Pain medications or sleeping medications (Benzodaizepines)  Do not take more than prescribed Pain, Sleep and Anxiety Medications. It is not advisable to combine anxiety,sleep and pain medications without talking with your primary care practitioner  Special Instructions: If you have smoked or chewed Tobacco  in the last 2 yrs please stop smoking, stop any regular Alcohol  and or any Recreational drug use.  Wear Seat belts while driving.  Please note: You were cared for by a hospitalist during your hospital stay. Once you are discharged, your primary care physician will handle any further medical issues. Please note that NO REFILLS for any discharge medications will be authorized once you are discharged, as it is imperative that you return to your primary care physician (or establish a relationship with a primary care physician if you do not have one) for your post hospital discharge needs so that they can reassess your need for medications and monitor your lab values.   Discharge wound care:   Complete by: As directed    Exit site care:  Once      Comments: Clean skin near exit site with chloraprep swab sticks.  Starting at catheter, use circular pattern around exit site, moving towards outer edges of area covered by dressing.  Apply gentamicin cream to site once daily.   Cover with dry dressin   Increase activity slowly   Complete by: As directed       Allergies as of 06/11/2022   No Known Allergies      Medication List     STOP taking these medications    cinacalcet 60 MG tablet Commonly known as: SENSIPAR   sevelamer carbonate 800 MG tablet Commonly known as: RENVELA   TUMS PO       TAKE these medications    calcitRIOL 0.5 MCG capsule Commonly known as: ROCALTROL Take 4 capsules (2 mcg total) by mouth 2 (two) times daily. Take inbetween meals What changed:  how much to take when to take this additional instructions   Citracal Maximum Plus Tabs Take 4 tablets by mouth 3 (three) times daily. Take in between meals if possible   gentamicin cream 0.1 % Commonly known as: GARAMYCIN Apply 1 Application topically at bedtime as needed (infection prevention).   multivitamin Tabs tablet TAKE 1 TABLET BY MOUTH EVERYDAY AT BEDTIME What changed: See the new instructions.  Discharge Care Instructions  (From admission, onward)           Start     Ordered   06/11/22 0000  Discharge wound care:       Comments: Exit site care:  Once      Comments: Clean skin near exit site with chloraprep swab sticks.  Starting at catheter, use circular pattern around exit site, moving towards outer edges of area covered by dressing.  Apply gentamicin cream to site once daily.  Cover with dry dressin   06/11/22 1447            Follow-up Information     [name], MD. Schedule an appointment as soon as possible for a visit in 1 week(s).   Specialty: Family Medicine Contact information: [place] [address] [phone]                No Known Allergies   Other Procedures/Studies: No results found.   TODAY-DAY OF DISCHARGE:  Subjective:   Micheal Bean today has no headache,no chest abdominal pain,no new weakness tingling or numbness, feels much better wants to go home today.    Objective:   Blood pressure (!) 148/94, pulse 72, temperature 98.4 F (36.9 C), temperature source Oral, resp. rate 18, height '6\' 3"'$  (1.905 m), weight 75.5 kg, SpO2 96 %.  Intake/Output Summary (Last 24 hours) at 06/11/2022 1557 Last data filed at 06/11/2022 1300 Gross per 24 hour  Intake 603 ml  Output 1290 ml  Net -687 ml   Filed Weights   06/10/22 1900 06/11/22 0439 06/11/22 1344  Weight: 75.1 kg 78.3 kg 75.5 kg    Exam: Awake Alert, Oriented *3, No new F.N deficits, Normal affect [place].AT,PERRAL Supple Neck,No JVD, No cervical lymphadenopathy appriciated.  Symmetrical Chest wall movement, Good air movement bilaterally, CTAB RRR,No Gallops,Rubs or new Murmurs, No Parasternal Heave +ve B.Sounds, Abd Soft, Non tender, No organomegaly appriciated, No rebound -guarding or rigidity. No Cyanosis, Clubbing or edema, No new Rash or bruise   PERTINENT RADIOLOGIC STUDIES: No results found.   PERTINENT LAB RESULTS: CBC: Recent Labs    06/08/22 1810 06/09/22 0631  WBC 7.4 6.4  HGB 8.8* 10.2*  HCT 30.3* 34.0*  PLT 249 332   CMET CMP     Component Value Date/Time   NA 140 06/11/2022 0505   K 3.7 06/11/2022 0505   CL 102 06/11/2022 0505   CO2 20 (L) 06/11/2022 0505   GLUCOSE 103 (H) 06/11/2022 0505   BUN 45 (H) 06/11/2022 0505   CREATININE 14.63 (H) 06/11/2022 0505   CALCIUM 6.4 (LL) 06/11/2022 0505   PROT 5.8 (L) 06/08/2022 1332   ALBUMIN 2.5 (L) 06/11/2022 0505   AST 14 (L) 06/08/2022 1332   ALT 6 06/08/2022 1332   ALKPHOS 351 (H) 06/08/2022 1332   BILITOT 0.4 06/08/2022 1332   GFRNONAA 4 (L) 06/11/2022 0505   GFRAA (L) 07/07/2009 0955    56        The eGFR has been calculated using the MDRD equation. This calculation has not been validated in all clinical situations. eGFR's persistently <60 mL/min signify possible Chronic Kidney Disease.    GFR Estimated Creatinine Clearance: 7.6 mL/min (A) (by C-G formula based on SCr of 14.63 mg/dL (H)). No results for  input(s): "LIPASE", "AMYLASE" in the last 72 hours. No results for input(s): "CKTOTAL", "CKMB", "CKMBINDEX", "TROPONINI" in the last 72 hours. Invalid input(s): "POCBNP" No results for input(s): "DDIMER" in the last 72  hours. No results for input(s): "HGBA1C" in the last 72 hours. No results for input(s): "CHOL", "HDL", "LDLCALC", "TRIG", "CHOLHDL", "LDLDIRECT" in the last 72 hours. No results for input(s): "TSH", "T4TOTAL", "T3FREE", "THYROIDAB" in the last 72 hours.  Invalid input(s): "FREET3" No results for input(s): "VITAMINB12", "FOLATE", "FERRITIN", "TIBC", "IRON", "RETICCTPCT" in the last 72 hours. Coags: No results for input(s): "INR" in the last 72 hours.  Invalid input(s): "PT" Microbiology: No results found for this or any previous visit (from the past 240 hour(s)).  FURTHER DISCHARGE INSTRUCTIONS:  Get Medicines reviewed and adjusted: Please take all your medications with you for your next visit with your Primary MD  Laboratory/radiological data: Please request your Primary MD to go over all hospital tests and procedure/radiological results at the follow up, please ask your Primary MD to get all Hospital records sent to his/her office.  In some cases, they will be blood work, cultures and biopsy results pending at the time of your discharge. Please request that your primary care M.D. goes through all the records of your hospital data and follows up on these results.  Also Note the following: If you experience worsening of your admission symptoms, develop shortness of breath, life threatening emergency, suicidal or homicidal thoughts you must seek medical attention immediately by calling 911 or calling your MD immediately  if symptoms less severe.  You must read complete instructions/literature along with all the possible adverse reactions/side effects for all the Medicines you take and that have been prescribed to you. Take any new Medicines after you have completely  understood and accpet all the possible adverse reactions/side effects.   Do not drive when taking Pain medications or sleeping medications (Benzodaizepines)  Do not take more than prescribed Pain, Sleep and Anxiety Medications. It is not advisable to combine anxiety,sleep and pain medications without talking with your primary care practitioner  Special Instructions: If you have smoked or chewed Tobacco  in the last 2 yrs please stop smoking, stop any regular Alcohol  and or any Recreational drug use.  Wear Seat belts while driving.  Please note: You were cared for by a hospitalist during your hospital stay. Once you are discharged, your primary care physician will handle any further medical issues. Please note that NO REFILLS for any discharge medications will be authorized once you are discharged, as it is imperative that you return to your primary care physician (or establish a relationship with a primary care physician if you do not have one) for your post hospital discharge needs so that they can reassess your need for medications and monitor your lab values.  Total Time spent coordinating discharge including counseling, education and face to face time equals greater than 30 minutes.  SignedOren Binet 06/11/2022 3:57 PM

## 2022-06-11 NOTE — Progress Notes (Signed)
PROGRESS NOTE        PATIENT DETAILS Name: Micheal Bean Age: 37 y.o. Sex: male Date of Birth: 06-21-1985 Admit Date: 06/08/2022 Admitting Physician Evalee Mutton Kristeen Mans, MD [id number], Clyde Canterbury, MD  Brief Summary: Patient is a 37 y.o.  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  Significant events: 3/5>> admit to [facility]  Significant studies: None  Significant microbiology data: None  Procedures: None  Consults: Neurology  Subjective: No muscle cramps-no twitching-lying comfortably in bed.  Objective: Vitals: Blood pressure (!) 155/96, pulse 75, temperature 98.3 F (36.8 C), temperature source Oral, resp. rate 18, height '6\' 3"'$  (1.905 m), weight 78.3 kg, SpO2 94 %.   Exam: Gen Exam:Alert awake-not in any distress HEENT:atraumatic, normocephalic Chest: B/L clear to auscultation anteriorly CVS:S1S2 regular Abdomen:soft non tender, non distended Extremities:no edema Neurology: Non focal Skin: no rash  Pertinent Labs/Radiology:    Latest Ref Rng & Units 06/09/2022    6:31 AM 06/08/2022    6:10 PM 06/08/2022    1:32 PM  CBC  WBC 4.0 - 10.5 K/uL 6.4  7.4  8.8   Hemoglobin 13.0 - 17.0 g/dL 10.2  8.8  9.2   Hematocrit 39.0 - 52.0 % 34.0  30.3  30.5   Platelets 150 - 400 K/uL 332  249  297     Lab Results  Component Value Date   NA 140 06/11/2022   K 3.7 06/11/2022   CL 102 06/11/2022   CO2 20 (L) 06/11/2022      Assessment/Plan: Hypocalcemia-s/p total parathyroidectomy 2/26 Suspicion for hungry bone syndrome Remains asymptomatic Calcium levels have essentially plateaued-still unchanged over the past 2 days. Dosage of calcium citrate and calcium gluconate has been increased-remains on calcitriol 2 mcg twice daily. 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   Weight as of this encounter: 78.3 kg.   Code status:   Code Status: Full Code   DVT Prophylaxis: heparin injection 5,000 Units Start: 06/08/22 2200   Family Communication: None at bedside   Disposition Plan: Status is: Inpatient Remains inpatient appropriate because: Severity of illness   Planned Discharge Destination:Home-in the next 1-2 days.   Diet: Diet Order             Diet renal with fluid restriction Fluid restriction: 1200 mL Fluid; Room service appropriate? Yes; Fluid consistency: Thin  Diet effective now                     Antimicrobial agents: Anti-infectives (From admission, onward)    None        MEDICATIONS: Scheduled Meds:  calcitRIOL  2 mcg Oral BID   calcium citrate  1,400 mg of elemental calcium Oral Q8H   gentamicin cream  1 Application Topical Daily   heparin  5,000 Units Subcutaneous Q8H   multivitamin  1 tablet Oral Daily   sodium chloride flush  3 mL Intravenous Q12H   Continuous Infusions:  sodium chloride     calcium gluconate 3 g (06/11/22 0557)   dialysis solution 1.5% low-MG/low-CA     dialysis solution 1.5% low-MG/low-CA     dialysis solution 2.5% low-MG/low-CA     PRN Meds:.sodium chloride, acetaminophen **OR** acetaminophen, albuterol, heparin sodium (porcine) 1,500 Units in dialysis solution 1.5% low-MG/low-CA 3,000 mL dialysis solution, heparin sodium (porcine) 1,500 Units in dialysis solution 2.5% low-MG/low-CA 3,000 mL dialysis solution, hydrALAZINE, ondansetron **OR** ondansetron (ZOFRAN) IV, oxyCODONE,  sodium chloride flush   I have personally reviewed following labs and imaging studies  LABORATORY DATA: CBC: Recent Labs  Lab 06/08/22 1332 06/08/22 1810 06/09/22 0631  WBC 8.8 7.4 6.4  NEUTROABS 6.2  --   --   HGB 9.2* 8.8* 10.2*  HCT 30.5* 30.3* 34.0*  MCV 92.7 95.6 91.4  PLT 297 249 332     Basic Metabolic Panel: Recent Labs  Lab 06/09/22 0631 06/09/22 1825 06/10/22 0441 06/10/22 1656 06/11/22 0505  NA 138 137 139 140 140  K 4.0 4.5 3.7 4.2 3.7  CL 99 101 102 102 102  CO2 '23 23 25 22 '$ 20*  GLUCOSE 84 93 125* 113* 103*  BUN 54* 55* 47* 50* 45*  CREATININE 15.44* 16.07* 15.04* 16.10* 14.63*  CALCIUM 5.5* 6.7* 6.2* 6.2* 6.4*  PHOS 6.1* 6.0* 5.0* 5.5* 4.6     GFR: Estimated Creatinine Clearance: 7.9 mL/min (A) (by C-G formula based on SCr of 14.63 mg/dL (H)).  Liver Function Tests: Recent Labs  Lab 06/08/22 1332 06/09/22 0631 06/09/22 1825 06/10/22 0441 06/10/22 1656 06/11/22 0505  AST 14*  --   --   --   --   --   ALT 6  --   --   --   --   --   ALKPHOS 351*  --   --   --   --   --   BILITOT 0.4  --   --   --   --   --   PROT 5.8*  --   --   --   --   --   ALBUMIN 2.5* 2.6* 2.3* 2.3* 2.3* 2.5*    No results for input(s): "LIPASE", "AMYLASE" in the last 168 hours. No results for input(s): "AMMONIA" in the last 168 hours.  Coagulation Profile: No results for input(s): "INR", "PROTIME" in the last 168 hours.  Cardiac Enzymes: No results for input(s): "CKTOTAL", "CKMB", "CKMBINDEX", "TROPONINI" in the last 168 hours.  BNP (last 3 results) No results for input(s): "PROBNP" in the last 8760 hours.  Lipid Profile: No results for input(s): "CHOL", "HDL", "LDLCALC", "TRIG", "CHOLHDL", "LDLDIRECT" in the last 72 hours.  Thyroid Function Tests: No results for input(s): "TSH", "T4TOTAL", "FREET4", "T3FREE", "THYROIDAB" in the last 72 hours.  Anemia Panel: No results for input(s): "VITAMINB12", "FOLATE", "FERRITIN", "TIBC", "IRON", "RETICCTPCT" in the  last 72 hours.  Urine analysis:    Component Value Date/Time   COLORURINE STRAW (A) 06/08/2022 1323   APPEARANCEUR CLEAR 06/08/2022 1323   LABSPEC 1.005 06/08/2022 1323   PHURINE 7.0 06/08/2022 1323   GLUCOSEU 50 (A) 06/08/2022 1323   HGBUR NEGATIVE 06/08/2022 1323   BILIRUBINUR NEGATIVE 06/08/2022 1323   KETONESUR NEGATIVE 06/08/2022 1323   PROTEINUR 100 (A) 06/08/2022 1323   UROBILINOGEN 0.2 06/25/2012 1826   NITRITE NEGATIVE 06/08/2022 1323   LEUKOCYTESUR SMALL (A) 06/08/2022 1323    Sepsis Labs: Lactic  Acid, Venous No results found for: "LATICACIDVEN"  MICROBIOLOGY: No results found for this or any previous visit (from the past 240 hour(s)).  RADIOLOGY STUDIES/RESULTS: No results found.   LOS: 3 days   Oren Binet, MD  [facility]    To contact the attending provider between 7A-7P or the covering provider during after hours 7P-7A, please log into the web site [email or web address] and access using universal [place] password for that web site. If you do not have the password, please call the hospital operator.  06/11/2022, 11:20 AM

## 2022-06-11 NOTE — Progress Notes (Signed)
D/C order noted. Contacted [facility] home therapies dept and spoke to [place], Therapist, sports. Staff advised of pt's d/c today and that pt should resume PD treatments at d/c.  Melven Sartorius Renal Navigator [phone]

## 2022-06-11 NOTE — Discharge Instructions (Addendum)
Calcium citrate: - Per nephrology instructions, take Calcium Citrate '600mg'$  (~'125mg'$  elemental calcium) 4 tablets TID. Plan to increase regimen outpatient if needed.

## 2022-06-11 NOTE — Progress Notes (Signed)
Patient ready for discharge pt given discharge instructions, all questions answered at this time, patient verbalized understanding. Able to ambulate off unit. When ready.

## 2022-06-11 NOTE — Plan of Care (Signed)
  Problem: Education: Goal: Knowledge of General Education information will improve Description: Including pain rating scale, medication(s)/side effects and non-pharmacologic comfort measures Outcome: Completed/Met   Problem: Health Behavior/Discharge Planning: Goal: Ability to manage health-related needs will improve Outcome: Completed/Met   Problem: Clinical Measurements: Goal: Ability to maintain clinical measurements within normal limits will improve Outcome: Completed/Met Goal: Will remain free from infection Outcome: Completed/Met Goal: Diagnostic test results will improve Outcome: Completed/Met Goal: Respiratory complications will improve Outcome: Completed/Met Goal: Cardiovascular complication will be avoided Outcome: Completed/Met   Problem: Activity: Goal: Risk for activity intolerance will decrease Outcome: Completed/Met   Problem: Nutrition: Goal: Adequate nutrition will be maintained Outcome: Completed/Met   Problem: Coping: Goal: Level of anxiety will decrease Outcome: Completed/Met   Problem: Elimination: Goal: Will not experience complications related to bowel motility Outcome: Completed/Met Goal: Will not experience complications related to urinary retention Outcome: Completed/Met   Problem: Pain Managment: Goal: General experience of comfort will improve Outcome: Completed/Met   Problem: Safety: Goal: Ability to remain free from injury will improve Outcome: Completed/Met   Problem: Skin Integrity: Goal: Risk for impaired skin integrity will decrease Outcome: Completed/Met   Problem: Fluid Volume: Goal: Compliance with measures to maintain balanced fluid volume will improve Outcome: Completed/Met   Problem: Health Behavior/Discharge Planning: Goal: Ability to manage health-related needs will improve Outcome: Completed/Met   Problem: Nutritional: Goal: Ability to make healthy dietary choices will improve Outcome: Completed/Met   Problem:  Clinical Measurements: Goal: Complications related to the disease process, condition or treatment will be avoided or minimized Outcome: Completed/Met

## 2022-06-11 NOTE — Progress Notes (Addendum)
[facility] Progress Note   Subjective:    Seen and examined patient at bedside. Noted Ca is 6.4 this AM. Denies any symptoms. Tolerated PD overnight with net UF 836m. Appears he is still over his EDW. Ordered to obtain a standing weight today. Plan for PD today.  Objective Vitals:   06/10/22 1650 06/10/22 1900 06/10/22 2114 06/11/22 0439  BP: (!) 146/90  (!) 153/96 (!) 155/96  Pulse: 98  77 75  Resp: '18  18 18  '$ Temp: 98.8 F (37.1 C)  98.4 F (36.9 C) 98.3 F (36.8 C)  TempSrc: Oral  Oral Oral  SpO2: 95%  98% 94%  Weight:  75.1 kg  78.3 kg  Height:       Physical Exam General: Young male, well-appearing, on RA, NAD Heart: S1 and S2; No murmurs, gallops, or rubs Lungs: Clear throughout; No wheezing, rales, or rhonchi Abdomen: Soft and non-tender Extremities: No LE edema Dialysis Access: PD catheter LLQ; failed R AVF   Filed Weights   06/10/22 0700 06/10/22 1900 06/11/22 0439  Weight: 79.5 kg 75.1 kg 78.3 kg    Intake/Output Summary (Last 24 hours) at 06/11/2022 0847 Last data filed at 06/11/2022 0655 Gross per 24 hour  Intake 480 ml  Output 1090 ml  Net -610 ml    Additional Objective Labs: Basic Metabolic Panel: Recent Labs  Lab 06/10/22 0441 06/10/22 1656 06/11/22 0505  NA 139 140 140  K 3.7 4.2 3.7  CL 102 102 102  CO2 25 22 20*  GLUCOSE 125* 113* 103*  BUN 47* 50* 45*  CREATININE 15.04* 16.10* 14.63*  CALCIUM 6.2* 6.2* 6.4*  PHOS 5.0* 5.5* 4.6   Liver Function Tests: Recent Labs  Lab 06/08/22 1332 06/09/22 0631 06/10/22 0441 06/10/22 1656 06/11/22 0505  AST 14*  --   --   --   --   ALT 6  --   --   --   --   ALKPHOS 351*  --   --   --   --   BILITOT 0.4  --   --   --   --   PROT 5.8*  --   --   --   --   ALBUMIN 2.5*   < > 2.3* 2.3* 2.5*   < > = values in this interval not displayed.   No results for input(s): "LIPASE", "AMYLASE" in the last 168 hours. CBC: Recent Labs  Lab 06/08/22 1332 06/08/22 1810 06/09/22 0631   WBC 8.8 7.4 6.4  NEUTROABS 6.2  --   --   HGB 9.2* 8.8* 10.2*  HCT 30.5* 30.3* 34.0*  MCV 92.7 95.6 91.4  PLT 297 249 332   Blood Culture    Component Value Date/Time   SDES BLOOD LEFT FOREARM 11/14/2020 2320   SPECREQUEST  11/14/2020 2320    BOTTLES DRAWN AEROBIC ONLY Blood Culture adequate volume   CULT  11/14/2020 2320    NO GROWTH 5 DAYS Performed at [facility] [address] [place] [address]   REPTSTATUS 11/20/2020 FINAL 11/14/2020 2320    Cardiac Enzymes: No results for input(s): "CKTOTAL", "CKMB", "CKMBINDEX", "TROPONINI" in the last 168 hours. CBG: No results for input(s): "GLUCAP" in the last 168 hours. Iron Studies: No results for input(s): "IRON", "TIBC", "TRANSFERRIN", "FERRITIN" in the last 72 hours. Lab Results  Component Value Date   INR 1.2 11/25/2021   INR 1.5 (H) 11/13/2020   Studies/Results: No results found.  Medications:  sodium chloride     calcium gluconate 3 g (06/11/22 0557)   dialysis solution 1.5% low-MG/low-CA     dialysis solution 1.5% low-MG/low-CA     dialysis solution 2.5% low-MG/low-CA      calcitRIOL  2 mcg Oral BID   calcium citrate  1,400 mg of elemental calcium Oral Q8H   gentamicin cream  1 Application Topical Daily   heparin  5,000 Units Subcutaneous Q8H   multivitamin  1 tablet Oral Daily   sodium chloride flush  3 mL Intravenous Q12H    Dialysis Orders: Center: [facility] 4 exchanges 1.5-2.5% 2.8 liters 1.5 hr dwell time. No daytime fill. EDW 74 kg.   Assessment/Plan:   S/P Total parathyroidectomy with autotransplantation of parathyroidectomy 05/31/2022. Has continued hungry bones syndrome. Ca++ was  down to 4.7 on admission. Ca++ levels this low are dangerous and we recommended hospital admission until serum Ca++ is > 7. Renal function panel bid for now.  Cont these medications for repleting Ca in the body> Cont Calcitriol 2.0 mcg bid at dc  DC dose for Ca citrate at home --> '1300mg'$  tid ( 4  pills tid) = '750mg'$  elemental per day; he knows to get this OTC at [facility], #180 per bottle, 1 bottle will last 2 weeks. Will raise or lower dose in OP setting as needed Get Ca levels twice weekly in OP setting at home training Ca level ~ 6.5 at dc today , corrected Ca ~ 7.8, okay for dc home today ESRD -  Continue CCPD on schedule. Noted he's over 4kg this morning of his EDW. Ordered to obtain a standing weight for today. Will use mix of 1.5% and 2.5% tonight for now. Hypertension/volume  -Bps mildly elevated. Ordered a standing weight today to determine an accurate weight (see above). He remains euvolemic on exam. Continue OP PD Rx for now but may use all 2.5% bags if he remains over his EDW. Anemia  - HGB 10.2. Follow HGB.  Metabolic bone disease - PO4 6.1. Continue binders.  Nutrition - K+ at goal. Renal diet with protein supplements.   ADDENDUM 06/11/22 at 1:45pm: current standing weight is 75.5kg. Not too far off from his EDW. Will use 1.5% and 2.5% bags for tonight. C. Ouida Sills, NP  Addendum 3/8 at 4:15 pm: pt is going home today, see instructions above for rocaltrol and Ca citrate dosing at home  Tobie Poet, NP Aline 06/11/2022,8:47 AM  LOS: 3 days   Pt seen, examined and agree w assess/plan as above with additions as indicated.  [facility] 06/11/2022, 4:17 PM

## 2022-06-14 ENCOUNTER — Telehealth: Payer: Self-pay

## 2022-06-14 NOTE — Transitions of Care (Post Inpatient/ED Visit) (Signed)
   06/14/2022  Name: Micheal Bean MRN: [id number] DOB: 1986-02-19  Today's TOC FU Call Status: Today's TOC FU Call Status:: Successful TOC FU Call Competed TOC FU Call Complete Date: 06/14/22  Transition Care Management Follow-up Telephone Call Date of Discharge: 06/11/22 Discharge Facility: [facility]) Type of Discharge: Inpatient Admission Primary Inpatient Discharge Diagnosis:: hypocalcemia How have you been since you were released from the hospital?: Better Any questions or concerns?: No  Items Reviewed: Did you receive and understand the discharge instructions provided?: Yes Medications obtained and verified?: Yes (Medications Reviewed) (he said he has all of the medications and did not need to review the list.  he did not have any questions about the med regime) Any new allergies since your discharge?: No Dietary orders reviewed?: Yes Type of Diet Ordered:: renal Do you have support at home?: Yes  Home Care and Equipment/Supplies: [place] Ordered?: No Any new equipment or medical supplies ordered?: No  Functional Questionnaire: Do you need assistance with bathing/showering or dressing?: No Do you need assistance with meal preparation?: No Do you need assistance with eating?: No Do you have difficulty maintaining continence: No Do you need assistance with getting out of bed/getting out of a chair/moving?: No Do you have difficulty managing or taking your medications?: No (He is independent with home peritoneal dialysis.)  Folllow up appointments reviewed: PCP Follow-up appointment confirmed?: No MD Provider Line Number:[phone] Given: No (He said he will call Dr Redmond Pulling when he is ready to schedule an appointment. He did not want to schedule one at this time) [facility] Follow-up appointment confirmed?: No (He said he is waiting to hear from nephrology for a follow up appointment. I told him to call their office if he does not hear from  them in then next 1-2 days and he said he would call .) Do you need transportation to your follow-up appointment?: No Do you understand care options if your condition(s) worsen?: Yes-patient verbalized understanding    SIGNATURE Eden Lathe, RN

## 2022-10-28 ENCOUNTER — Encounter: Payer: Self-pay | Admitting: Family Medicine

## 2022-11-15 NOTE — Telephone Encounter (Signed)
[address] [place], [place] [address]

## 2023-03-10 IMAGING — CR DG BONE SURVEY MET
8 of 10 series · 8 of 10 positions shown · non-contrast
Comparison: 11/13/2020, 11/18/2020, 11/20/2020

CLINICAL DATA: Right chest wall mass, destructive right clavicular
lesion on recent CT and MRI

EXAM:
METASTATIC BONE SURVEY

[skull lat]
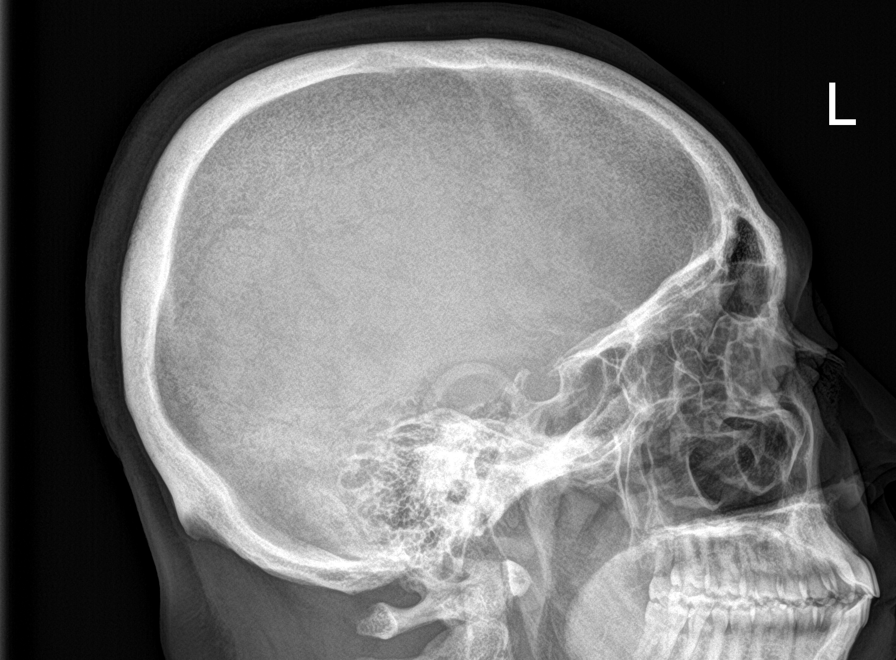

[shoulder ap (1 of 2)]
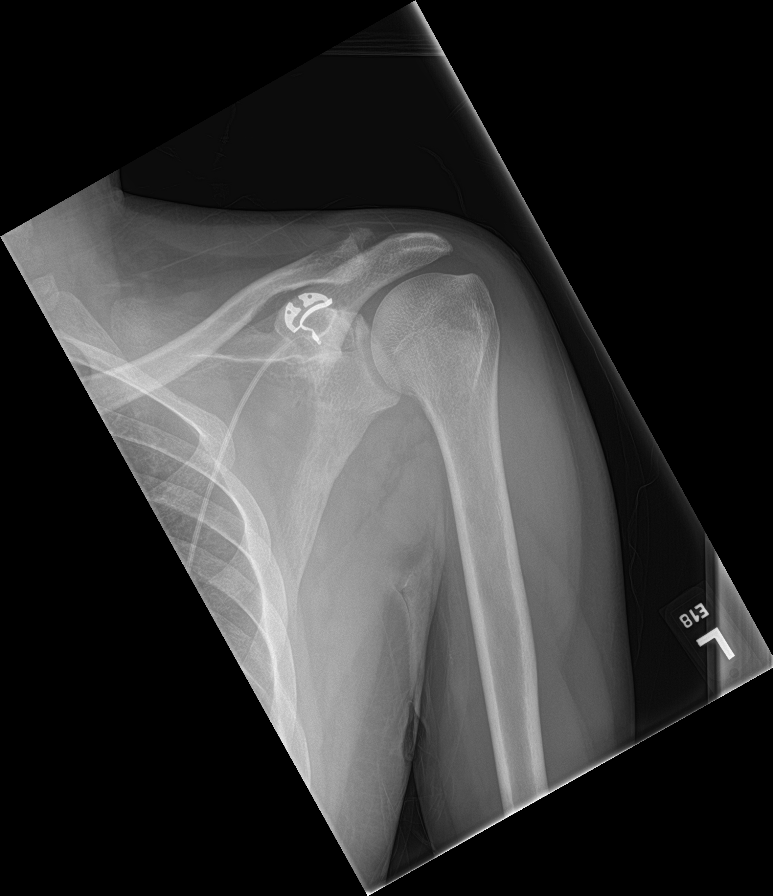

[shoulder ap (2 of 2)]
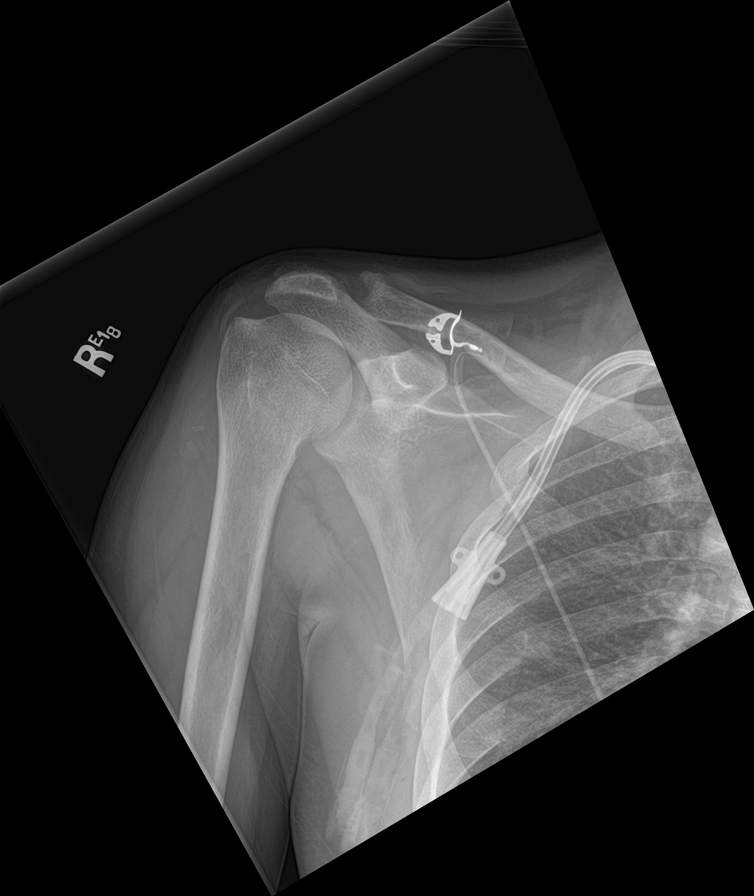

[humerus ap (1 of 2)]
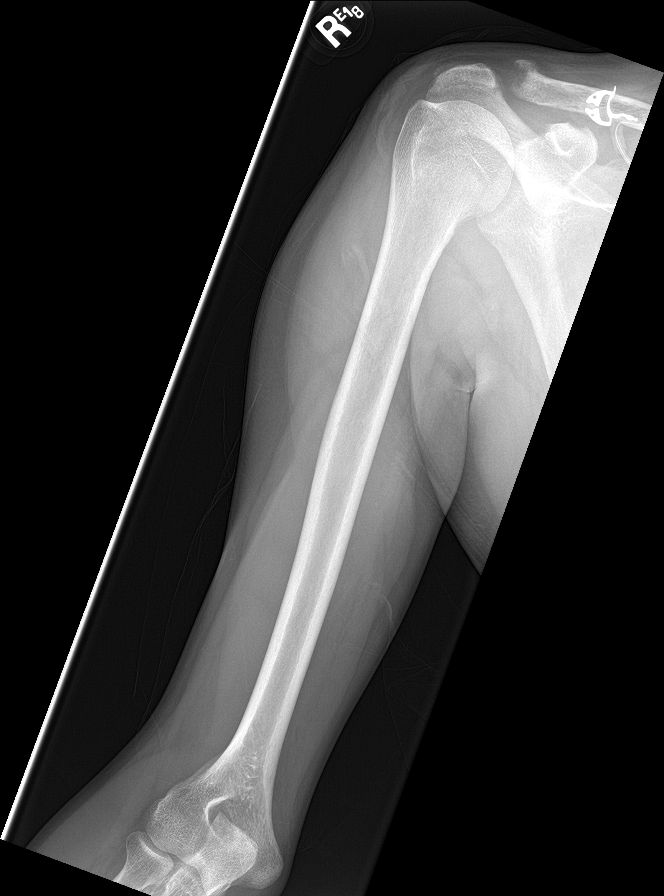

[humerus ap (2 of 2)]
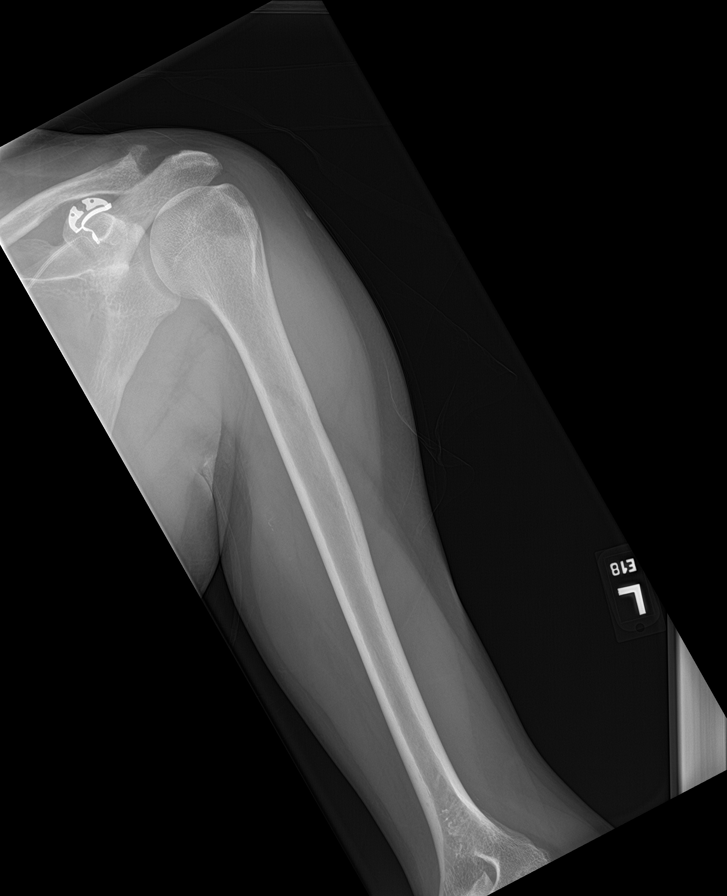

[forearm ap (1 of 2)]
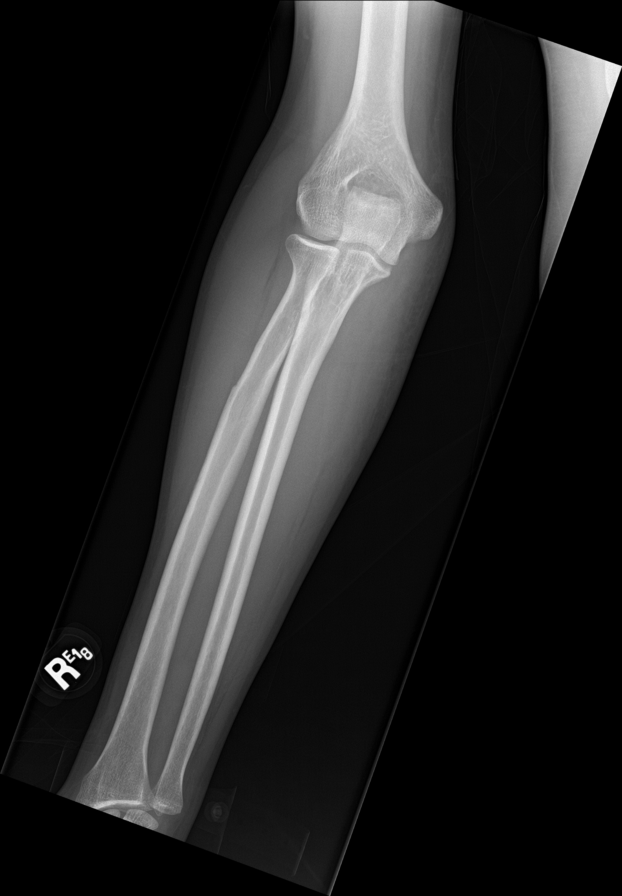

[forearm ap (2 of 2)]
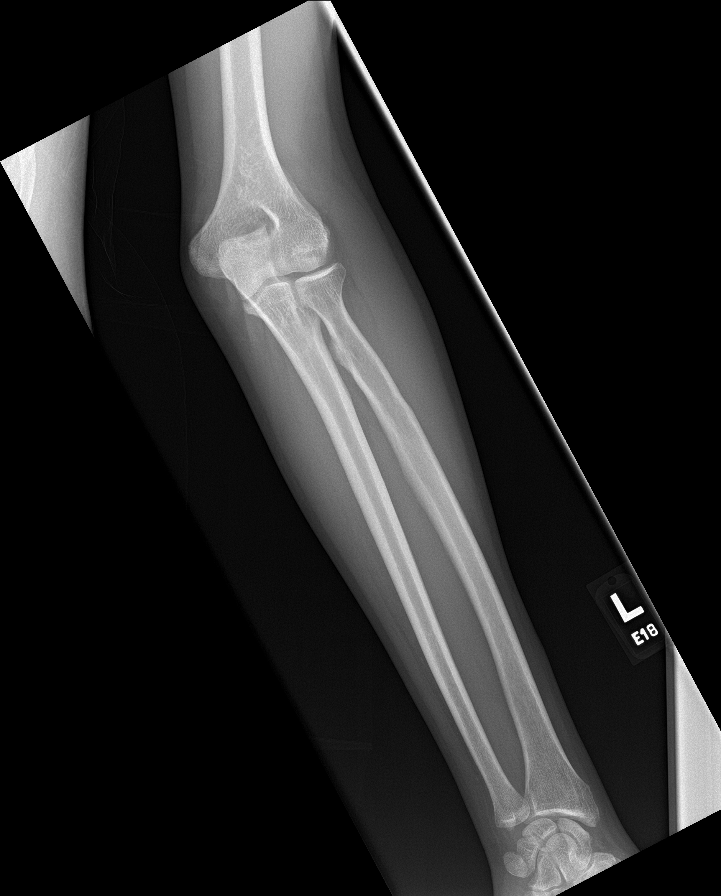

[c-spine ap]
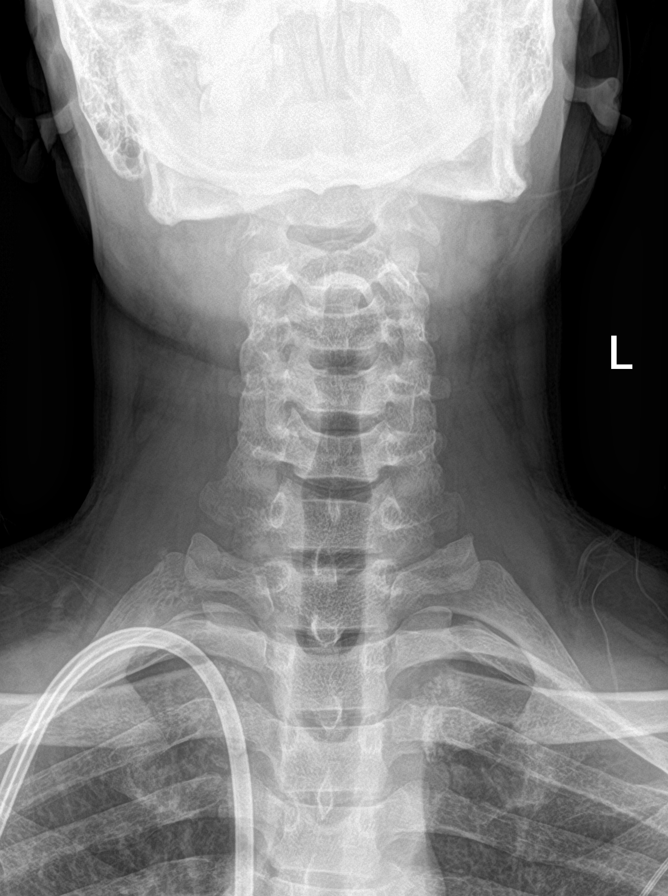

[8 of 10 positions shown; findings below may reference images not displayed]

FINDINGS: Standard skeletal survey was performed.

Lateral skull: Innumerable punctate lucencies are seen throughout
the calvarium in a configuration consistent with salt and pepper
appearance. No other destructive lesions. No acute bony
abnormality.

Upper extremities: Frontal views of the bilateral upper extremities
from the shoulders through the wrists are obtained. There is
symmetrical resorption of the distal clavicles abutting the
acromioclavicular joints, with normal appearance of the acromion
process. No other acute or destructive bony abnormalities.

Spine: Frontal and lateral views of the cervical, thoracic, and
lumbar spine was performed. There are no acute or destructive bony
abnormalities. Disc spaces are well preserved. Paraspinal soft
tissues are unremarkable. High attenuation material overlying the
central upper abdomen consistent with gastric contents.

Chest: Frontal view of the chest demonstrates stable right internal
jugular dialysis catheter. Cardiac silhouette is unremarkable. No
airspace disease, effusion, or pneumothorax. No acute bony
abnormalities. The lytic process involving the medial right clavicle
seen on CT is less well appreciated by radiograph.

Pelvis: Single frontal view of the pelvis demonstrates no acute bony
abnormality. Symmetrical erosive changes are seen involving the
bilateral sacroiliac joints. Joint spaces are otherwise well
preserved.

Lower extremities: Frontal views of the bilateral lower extremities
from the hips through the ankles are obtained. There are small areas
of subperiosteal bone resorption along the medial aspect of the
proximal tibial metadiaphyseal junctions bilaterally. No other acute
or destructive bony abnormalities. Joint spaces are well preserved.
IMPRESSION: 1. Lytic lesions involving the calvarium, distal clavicles,
sacroiliac joints, and proximal tibia bilaterally most consistent
with sequela of renal osteodystrophy and secondary
hyperparathyroidism due to end-stage renal disease.
2. No acute fractures.

## 2023-05-12 ENCOUNTER — Observation Stay (HOSPITAL_COMMUNITY)
Admission: EM | Admit: 2023-05-12 | Discharge: 2023-05-13 | Disposition: A | Discharge status: Home / Self Care | Payer: Medicare Other | Attending: Emergency Medicine | Admitting: Emergency Medicine

## 2023-05-12 ENCOUNTER — Other Ambulatory Visit: Payer: Self-pay

## 2023-05-12 ENCOUNTER — Emergency Department (HOSPITAL_COMMUNITY): Payer: Medicare Other

## 2023-05-12 DIAGNOSIS — I951 Orthostatic hypotension: Secondary | ICD-10-CM | POA: Diagnosis not present

## 2023-05-12 DIAGNOSIS — I120 Hypertensive chronic kidney disease with stage 5 chronic kidney disease or end stage renal disease: Secondary | ICD-10-CM | POA: Insufficient documentation

## 2023-05-12 DIAGNOSIS — N186 End stage renal disease: Secondary | ICD-10-CM | POA: Diagnosis not present

## 2023-05-12 DIAGNOSIS — D500 Iron deficiency anemia secondary to blood loss (chronic): Principal | ICD-10-CM | POA: Insufficient documentation

## 2023-05-12 DIAGNOSIS — Z992 Dependence on renal dialysis: Secondary | ICD-10-CM | POA: Diagnosis not present

## 2023-05-12 DIAGNOSIS — Z79899 Other long term (current) drug therapy: Secondary | ICD-10-CM | POA: Insufficient documentation

## 2023-05-12 DIAGNOSIS — R42 Dizziness and giddiness: Secondary | ICD-10-CM | POA: Diagnosis present

## 2023-05-12 DIAGNOSIS — D638 Anemia in other chronic diseases classified elsewhere: Secondary | ICD-10-CM | POA: Diagnosis present

## 2023-05-12 DIAGNOSIS — D649 Anemia, unspecified: Secondary | ICD-10-CM | POA: Diagnosis not present

## 2023-05-12 LAB — CBC WITH DIFFERENTIAL/PLATELET
Abs Immature Granulocytes: 0.04 10*3/uL (ref 0.00–0.07)
Basophils Absolute: 0.1 10*3/uL (ref 0.0–0.1)
Basophils Relative: 1 %
Eosinophils Absolute: 0.3 10*3/uL (ref 0.0–0.5)
Eosinophils Relative: 2 %
HCT: 16.9 % — ABNORMAL LOW (ref 39.0–52.0)
Hemoglobin: 5.4 g/dL — CL (ref 13.0–17.0)
Immature Granulocytes: 0 %
Lymphocytes Relative: 23 %
Lymphs Abs: 2.5 10*3/uL (ref 0.7–4.0)
MCH: 28.3 pg (ref 26.0–34.0)
MCHC: 32 g/dL (ref 30.0–36.0)
MCV: 88.5 fL (ref 80.0–100.0)
Monocytes Absolute: 0.7 10*3/uL (ref 0.1–1.0)
Monocytes Relative: 6 %
Neutro Abs: 7.2 10*3/uL (ref 1.7–7.7)
Neutrophils Relative %: 68 %
Platelets: 411 10*3/uL — ABNORMAL HIGH (ref 150–400)
RBC: 1.91 MIL/uL — ABNORMAL LOW (ref 4.22–5.81)
RDW: 15.1 % (ref 11.5–15.5)
WBC: 10.7 10*3/uL — ABNORMAL HIGH (ref 4.0–10.5)
nRBC: 0 % (ref 0.0–0.2)

## 2023-05-12 LAB — BASIC METABOLIC PANEL
Anion gap: 18 — ABNORMAL HIGH (ref 5–15)
BUN: 57 mg/dL — ABNORMAL HIGH (ref 6–20)
CO2: 25 mmol/L (ref 22–32)
Calcium: 9.1 mg/dL (ref 8.9–10.3)
Chloride: 93 mmol/L — ABNORMAL LOW (ref 98–111)
Creatinine, Ser: 19.36 mg/dL — ABNORMAL HIGH (ref 0.61–1.24)
GFR, Estimated: 3 mL/min — ABNORMAL LOW (ref >60)
Glucose, Bld: 104 mg/dL — ABNORMAL HIGH (ref 70–99)
Potassium: 3.9 mmol/L (ref 3.5–5.1)
Sodium: 136 mmol/L (ref 135–145)

## 2023-05-12 LAB — HEMOGLOBIN AND HEMATOCRIT, BLOOD
HCT: 15.4 % — ABNORMAL LOW (ref 39.0–52.0)
Hemoglobin: 4.9 g/dL — CL (ref 13.0–17.0)

## 2023-05-12 LAB — PREPARE RBC (CROSSMATCH)

## 2023-05-12 MED ORDER — SODIUM CHLORIDE 0.9% IV SOLUTION: Freq: Once | INTRAVENOUS | Status: DC

## 2023-05-12 MED ORDER — ACETAMINOPHEN 325 MG PO TABS: 650.0000 mg | ORAL_TABLET | Freq: Four times a day (QID) | ORAL | Status: DC | PRN

## 2023-05-12 MED ORDER — POLYETHYLENE GLYCOL 3350 17 G PO PACK: 17.0000 g | PACK | Freq: Every day | ORAL | Status: DC | PRN

## 2023-05-12 MED ORDER — SODIUM CHLORIDE 0.9 % IV BOLUS
500.0000 mL | Freq: Once | INTRAVENOUS | Status: AC
  Administered 2023-05-12: 500 mL via INTRAVENOUS

## 2023-05-12 MED ORDER — CALCITRIOL 0.5 MCG PO CAPS
2.0000 ug | ORAL_CAPSULE | Freq: Two times a day (BID) | ORAL | Status: DC
  Administered 2023-05-13 (×2): 2 ug via ORAL
  Filled 2023-05-12: qty 8
  Filled 2023-05-12: qty 4

## 2023-05-12 MED ORDER — RENA-VITE PO TABS
1.0000 | ORAL_TABLET | Freq: Every day | ORAL | Status: DC
  Administered 2023-05-13: 1 via ORAL
  Filled 2023-05-12 (×2): qty 1

## 2023-05-12 MED ORDER — CALCIUM ACETATE (PHOS BINDER) 667 MG PO CAPS
1334.0000 mg | ORAL_CAPSULE | Freq: Three times a day (TID) | ORAL | Status: DC
  Administered 2023-05-13 (×2): 1334 mg via ORAL
  Filled 2023-05-12 (×2): qty 2

## 2023-05-12 MED ORDER — AMLODIPINE BESYLATE 10 MG PO TABS
10.0000 mg | ORAL_TABLET | Freq: Every day | ORAL | Status: DC
  Administered 2023-05-13: 10 mg via ORAL
  Filled 2023-05-12: qty 1

## 2023-05-12 MED ORDER — ACETAMINOPHEN 650 MG RE SUPP: 650.0000 mg | Freq: Four times a day (QID) | RECTAL | Status: DC | PRN

## 2023-05-12 MED ORDER — SODIUM CHLORIDE 0.9% FLUSH
3.0000 mL | Freq: Two times a day (BID) | INTRAVENOUS | Status: DC
  Administered 2023-05-13 (×2): 3 mL via INTRAVENOUS

## 2023-05-12 NOTE — Progress Notes (Signed)
 Critical hgb 5.4 Md Arlan Belling and MD [facility] aware.

## 2023-05-12 NOTE — ED Provider Triage Note (Signed)
 Emergency Medicine Provider Triage Evaluation Note  Micheal Bean , a 38 y.o. male  was evaluated in triage.  Pt complains of 38 year old male here today for dizziness when standing up.  Patient receives peritoneal dialysis..  Review of Systems    Physical Exam  BP 104/80 (BP Location: Right Arm)   Pulse (!) 117   Temp 98.2 F (36.8 C)   Resp 18   Ht 6' 3 (1.905 m)   Wt 81.6 kg   SpO2 100%   BMI 22.50 kg/m  Gen:   Awake, no distress   Resp:  Normal effort  MSK:   Moves extremities without difficulty  Other:    Medical Decision Making  Medically screening exam initiated at 10:21 AM.  Appropriate orders placed.  VON QUINTANAR was informed that the remainder of the evaluation will be completed by another provider, this initial triage assessment does not replace that evaluation, and the importance of remaining in the ED until their evaluation is complete.  Dizziness upon standing.  EKG nonischemic.  Mildly tachycardic.  Recommended room for the patient, however we do not have rooms available so the patient went to the waiting room.  Labs ordered.   Mannie Pac T, DO 05/12/23 1022

## 2023-05-12 NOTE — ED Provider Notes (Signed)
 [place] EMERGENCY DEPARTMENT AT [facility] Provider Note   CSN: [id number] Arrival date & time: 05/12/23  9074     History  Chief Complaint  Patient presents with   Dizziness    Micheal Bean is a 38 y.o. male.  Patient reports that he has been feeling dizzy for the past 3 weeks.  Patient reports he had a change in his blood pressure medicine.  He was on amlodipine  and his doctor put him on a combination amlodipine  valsartan .  Patient states that whenever he stands up he feels like he is going to pass out.  Patient is currently on peritoneal dialysis.  He has not had any problems with dialysis he does not feel like he is volume overloaded.  Patient denies any fever or chills he denies any cough or congestion.  Patient denies any neurologic complaints he has not had any visual changes no hearing changes no headache.   Dizziness      Home Medications Prior to Admission medications   Medication Sig Start Date End Date Taking? Authorizing Provider  amLODipine -valsartan  (EXFORGE ) 10-160 MG tablet Take 1 tablet by mouth every evening. 04/29/23  Yes [provider]  calcitRIOL  (ROCALTROL ) 0.5 MCG capsule Take 4 capsules (2 mcg total) by mouth 2 (two) times daily. Take inbetween meals 06/11/22  Yes Ghimire, Donalda HERO, MD  gentamicin  cream (GARAMYCIN ) 0.1 % Apply 1 Application topically at bedtime as needed (infection prevention). 05/21/21  Yes [provider]  multivitamin (RENA-VIT) TABS tablet TAKE 1 TABLET BY MOUTH EVERYDAY AT BEDTIME Patient taking differently: Take 1 tablet by mouth daily with breakfast. 06/08/21  Yes Tanda Bleacher, MD      Allergies    Patient has no known allergies.    Review of Systems   Review of Systems  Neurological:  Positive for dizziness.  All other systems reviewed and are negative.   Physical Exam Updated Vital Signs BP 123/83   Pulse 98   Temp 98.5 F (36.9 C) (Oral)   Resp 16   Ht 6' 3 (1.905 m)   Wt 81.6 kg    SpO2 99%   BMI 22.50 kg/m  Physical Exam Vitals reviewed.  Constitutional:      Appearance: Normal appearance.  Cardiovascular:     Rate and Rhythm: Normal rate.  Pulmonary:     Effort: Pulmonary effort is normal.  Abdominal:     General: Abdomen is flat.  Musculoskeletal:        General: Normal range of motion.  Skin:    General: Skin is warm.  Neurological:     General: No focal deficit present.     Mental Status: He is alert.     ED Results / Procedures / Treatments   Labs (all labs ordered are listed, but only abnormal results are displayed) Labs Reviewed  BASIC METABOLIC PANEL - Abnormal; Notable for the following components:      Result Value   Chloride 93 (*)    Glucose, Bld 104 (*)    BUN 57 (*)    Creatinine, Ser 19.36 (*)    GFR, Estimated 3 (*)    Anion gap 18 (*)    All other components within normal limits  URINALYSIS, ROUTINE W REFLEX MICROSCOPIC  CBC  CBG MONITORING, ED    EKG EKG Interpretation Date/Time:  Thursday May 12 2023 10:04:40 EST Ventricular Rate:  96 PR Interval:  168 QRS Duration:  90 QT Interval:  380 QTC Calculation: 480 R Axis:  75  Text Interpretation: Normal sinus rhythm Prolonged QT Abnormal ECG When compared with ECG of 08-Jun-2022 13:26, PREVIOUS ECG IS PRESENT Confirmed by Mannie Pac [phone]) on 05/12/2023 10:17:00 AM  Radiology No results found.  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  Patient was started on a new blood pressure medicine 3 weeks ago and that is around the time the symptoms started.  Amount and/or Complexity of Data Reviewed Labs: ordered. Decision-making details documented in ED Course.    Details: Labs ordered reviewed and interpreted.  Risk Risk Details: Patient given IV fluids time 500 cc.  CBC is pending.  Patient's care turned over to Raring Idol, MD at 4:00 PM labs and IV fluids pending           Final Clinical Impression(s) / ED Diagnoses Final diagnoses:  Dizziness    Rx / DC Orders ED Discharge Orders     None         [name], PA-C 05/12/23 1612    Kingsley, Victoria K, DO 05/15/23 [phone]

## 2023-05-12 NOTE — ED Triage Notes (Addendum)
 Pt. Stated. When I sit up Im dizzy. I can't sit up or stand up for long or I feel like Im going to pass out.  Pt does peritoneal dialysis.

## 2023-05-12 NOTE — Assessment & Plan Note (Signed)
 Patient's last CBC that we have is from 11 months ago with a hemoglobin of 10.2.  Patient does report that he had a more recent CBC done approximately 3 months ago which was 11.  Regardless patient's current hemoglobin is 5.4.  Normocytic normochromic.  No clinically significant bleed reported.  ER provider did do a rectal exam with finding of no melena - there note Is pending transcription.  Less I will avoid any anticoagulation at this time given precarious status of the patient.  I have ordered a full anemia workup including iron B12 folate reticulocyte have to LFT as well as electrophoresis.  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  Patient ordered for 2 unit PRBC.

## 2023-05-12 NOTE — ED Provider Notes (Signed)
 Medical Decision Making: Care of patient assumed from Sonny MARLA Showers, PA-C at 7:24 PM.  Agree with history.  See their note for further details.  Briefly, 38 y.o. male with PMH/PSH as below.  Past Medical History:  Diagnosis Date   Anemia    ESRD (end stage renal disease) (HCC)    PD at home   History of blood transfusion 11/2020   4 units   History of blood transfusion    Seizure (HCC)    [facility]   Past Surgical History:  Procedure Laterality Date   AV FISTULA PLACEMENT Right 03/02/2021   Procedure: RIGHT ARM Radio-Cephalic  ARTERIOVENOUS (AV) FISTULA CREATION;  Surgeon: Lanis Fonda BRAVO, MD;  Location: [facility] OR;  Service: Vascular;  Laterality: Right;   CAPD INSERTION N/A 05/20/2021   Procedure: LAPAROSCOPIC INSERTION CONTINUOUS AMBULATORY PERITONEAL DIALYSIS  CATHETER WITH POSSIBLE OMENTOPEXY;  Surgeon: Magda Debby SAILOR, MD;  Location: [facility] OR;  Service: Vascular;  Laterality: N/A;   IR FLUORO GUIDE CV LINE RIGHT  11/18/2020   IR US  GUIDE VASC ACCESS RIGHT  11/18/2020   NEPHRECTOMY     childhhod, RT side   PARATHYROIDECTOMY Left 05/31/2022   Procedure: TOTAL PARATHYROIDECTOMY WITH AUTOTRANSPLANT TO LEFT FOREARM;  Surgeon: Eletha Boas, MD;  Location: [facility] OR;  Service: General;  Laterality: Left;   QUADRICEPS TENDON REPAIR Right 10/29/2021   Procedure: RIGHT QUADRICEP REPAIR AND PRIMARY REPAIR ANTERIOR CAPSULE;  Surgeon: Doll Skates, MD;  Location: [facility] OR;  Service: Orthopedics;  Laterality: Right;      Patient presents with 3 weeks of dizziness and lightheadedness since a new blood pressure medication was added by his cardiologist.  Has had multiple presyncopal episodes but no episodes of syncope.  Was orthostatic on vital signs in the ED.  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 -Mildly tachycardic, otherwise vital signs stable. -CBC resulted with significant normocytic anemia at 5.4 and hematocrit of 16.9.  On recheck hemoglobin was noted to be 4.9 -Upon reevaluation, patient with no acute complaints at this time.  He has benign abdominal exam and with no obvious sources of bleeding on history.  Rectal exam performed with small amount of light brown stool, was sent to lab for stool guaiac testing however unfortunately this was misplaced. -Patient was consented for blood and was crossmatched for 2 units of PRBCs -Subsequently requested admission and case was discussed with Dr. Moody with the hospitalist team.  Please see their note for further treatment plan details -Patient had no acute events while under my care in the emergency department.     The plan for this patient was discussed with Dr. Charlyn, who voiced agreement and who oversaw evaluation and treatment of this patient.  Raring Larna, MD Emergency Medicine, PGY-2  Note: Dragon medical dictation software was used in the creation of this note.   ED Dispo: Linard Larna Raring, MD 05/12/23 7656    Charlyn Sora, MD 05/12/23 [phone]

## 2023-05-12 NOTE — Consult Note (Addendum)
 ESRD Consult Note  Reason for consult: ESRD, provision of dialysis  Assessment/Recommendations:  ESRD -outpatient HD orders: [facility], [facility] (Dr. Marlee).  Total cycler therapy time 10 hours 26 minutes.  Total cycler volume 18,800 mL.  Average drawl time 1 hour.  6 exchanges.  Night fill volume 2800 mL.  Last fill 2000 mL.  1 day exchange with 2000 mL. + Icodextrin.  EDW 74 kg. -Holding off on PD tonight given his symptoms, will plan for PD tomorrow night (or earlier if needed, will reassess in the AM). No urgent indications for renal replacement therapy tonight based on his volume status and labs  Volume/ hypertension  -BP acceptable, euvolemic on exam  Symptomatic anemia Anemia of Chronic Kidney Disease -currently ordered for 2u prbc, Fe panel pending -Transfuse PRN for Hgb <7 -work up per primary service  Secondary Hyperparathyroidism/Hyperphosphatemia History of parathyroidectomy - resume home calcitriol , on phoslo  2 tabs TIDAC-will continue  # Additional recommendations: - Dose all meds for creatinine clearance < 10 ml/min  - Unless absolutely necessary, no MRIs with gadolinium.  - Implement save arm precautions.  Prefer needle sticks in the dorsum of the hands or wrists.  No blood pressure measurements in arm. - If blood transfusion is requested during hemodialysis sessions, please alert us  prior to the session.  - If a hemodialysis catheter line culture is requested, please alert us  as only hemodialysis nurses are able to collect those specimens.   Recommendations were discussed with the primary team.  Ephriam Stank, MD [facility]  History of Present Illness: Micheal Bean is a/an 38 y.o. male with a past medical history of ESRD on PD (history of solitary kidney with obstructive uropathy), history anemia, history of parathyroidectomy, hypertension who presents with dizziness.  Had a recent change in his blood pressure medication from amlodipine  to  amlodipine -valsartan .  Has been experiencing dizziness for about 2 days. Was found to be orthostatic positive as per ER.  Was also found to have severe anemia with a hemoglobin of 5.4.  Receiving 2 units PRBC.  He did have a chest x-ray without any active disease (personally reviewed). Last hgb as an outpatient was from 1/13 which was 11.4. Patient seen and examined on 33m. He reports that PD has been going well without any issues, last treatment was last night. He does report that PD is heparinized. Currently has his first unit of PRBC running. Denies any chest pain, SOB, abd pain, bloody effluent, alarms with cycler, swelling. He is anuric at baseline.  Medications:  Current Facility-Administered Medications  Medication Dose Route Frequency Provider Last Rate Last Admin   0.9 %  sodium chloride  infusion (Manually program via Guardrails IV Fluids)   Intravenous Once Moody Alto, MD       acetaminophen  (TYLENOL ) tablet 650 mg  650 mg Oral Q6H PRN Moody Alto, MD       Or   acetaminophen  (TYLENOL ) suppository 650 mg  650 mg Rectal Q6H PRN Moody Alto, MD       NOREEN ON 05/13/2023] amLODipine  (NORVASC ) tablet 10 mg  10 mg Oral Daily Goel, Hersh, MD       calcitRIOL  (ROCALTROL ) capsule 2 mcg  2 mcg Oral BID Moody Alto, MD       NOREEN ON 05/13/2023] multivitamin (RENA-VIT) tablet 1 tablet  1 tablet Oral Q breakfast Goel, Hersh, MD       polyethylene glycol (MIRALAX  / GLYCOLAX ) packet 17 g  17 g Oral Daily PRN Moody Alto, MD  sodium chloride  flush (NS) 0.9 % injection 3 mL  3 mL Intravenous Q12H Moody Alto, MD         ALLERGIES Patient has no known allergies.  MEDICAL HISTORY Past Medical History:  Diagnosis Date   Anemia    ESRD (end stage renal disease) (HCC)    PD at home   History of blood transfusion 11/2020   4 units   History of blood transfusion    Seizure (HCC)    [facility]     SOCIAL HISTORY Social History   Socioeconomic History   Marital status:  Single    Spouse name: Not on file   Number of children: 2   Years of education: Not on file   Highest education level: Not on file  Occupational History   Not on file  Tobacco Use   Smoking status: Never   Smokeless tobacco: Never  Vaping Use   Vaping status: Never Used  Substance and Sexual Activity   Alcohol use: Not Currently   Drug use: Not Currently   Sexual activity: Not Currently  Other Topics Concern   Not on file  Social History Narrative   Not on file   Social Drivers of Health   Financial Resource Strain: Not on file  Food Insecurity: No Food Insecurity (06/08/2022)   Hunger Vital Sign    Worried About Running Out of Food in the Last Year: Never true    Ran Out of Food in the Last Year: Never true  Transportation Needs: No Transportation Needs (06/08/2022)   PRAPARE - Administrator, Civil Service (Medical): No    Lack of Transportation (Non-Medical): No  Physical Activity: Not on file  Stress: Not on file  Social Connections: Not on file  Intimate Partner Violence: Not At Risk (06/08/2022)   Humiliation, Afraid, Rape, and Kick questionnaire    Fear of Current or Ex-Partner: No    Emotionally Abused: No    Physically Abused: No    Sexually Abused: No     FAMILY HISTORY Family History  Problem Relation Age of Onset   Hypertension Mother    Kidney disease Maternal Grandmother      Review of Systems: 12 systems were reviewed and negative except per HPI  Physical Exam: Vitals:   05/12/23 2130 05/12/23 2158  BP: (!) 139/96 (!) 145/95  Pulse: (!) 117 (!) 103  Resp: 17 18  Temp:  98.4 F (36.9 C)  SpO2: 100% 100%   No intake/output data recorded.  Intake/Output Summary (Last 24 hours) at 05/12/2023 2321 Last data filed at 05/12/2023 1826 Gross per 24 hour  Intake 500 ml  Output --  Net 500 ml   General: well-appearing, no acute distress, laying flat in bed HEENT: anicteric sclera, MMM CV: normal rate, no murmurs, no edema Lungs:  bilateral chest rise, normal wob Abd: soft, non-tender, non-distended Skin: no visible lesions or rashes Psych: alert, engaged, appropriate mood and affect Neuro: normal speech, no gross focal deficits  Dialysis access: PD catheter c/d/i  Test Results Reviewed Lab Results  Component Value Date   NA 136 05/12/2023   K 3.9 05/12/2023   CL 93 (L) 05/12/2023   CO2 25 05/12/2023   BUN 57 (H) 05/12/2023   CREATININE 19.36 (H) 05/12/2023   CALCIUM  9.1 05/12/2023   ALBUMIN  2.4 (L) 06/11/2022   PHOS 5.6 (H) 06/11/2022    I have reviewed relevant outside healthcare records

## 2023-05-12 NOTE — Assessment & Plan Note (Signed)
 Engaged Dr Bunnie Carol singh to help out with PD

## 2023-05-12 NOTE — Assessment & Plan Note (Signed)
 This is patient's presenting symptom.  Patient correlates this with starting on valsartan  for hypertension.  At this time I will hold patient's valsartan .  Continue with amlodipine .  I think over the long-term once patient's anemia is corrected he could process probably go back on valsartan .  Will monitor clinically.  I think dizziness is due to symptomatic anemia.

## 2023-05-12 NOTE — H&P (Signed)
 History and Physical    Patient: Micheal Bean FMW:[id number] DOB: July 05, 1985 DOA: 05/12/2023 DOS: the patient was seen and examined on 05/12/2023 PCP: Tanda Bleacher, MD  Patient coming from: Home  Chief Complaint:  Chief Complaint  Patient presents with   Dizziness   HPI: Micheal Bean is a 38 y.o. male with medical history significant of end-stage renal disease on peritoneal dialysis.  Patient reports that about 2 weeks ago he had a change in his blood pressure medication from pure amlodipine  to amlodipine  plus valsartan .  Since then patient reports a sensation of dizziness.  He does not pass out no palpitation the dizziness is nonspecific he does not have vertigo.  He does not report presyncope per se.  No chest pain or shortness of breath or leg swelling no fever no vomiting or diarrhea.  Patient came to the ER for evaluation of his dizziness, found to be orthostatic positive per report from the ER provider.  Patient also found to have severe anemia hemoglobin around 5 g/dL.  Patient is currently consented for 2 unit of PRBC.  Medical evaluation is sought. Patinet did get 500 cc fluid in ER.  Patient currently offers no complaints.  Patient reports he has been having minimal epistaxis when he blows his nose in the morning for the last 4 nights.  Otherwise denies any peritoneal dialysis catheter site bleeding any melena any skin bruising etc.  Patient does not make urine Review of Systems: As mentioned in the history of present illness. All other systems reviewed and are negative. Past Medical History:  Diagnosis Date   Anemia    ESRD (end stage renal disease) (HCC)    PD at home   History of blood transfusion 11/2020   4 units   History of blood transfusion    Seizure (HCC)    [facility]   Past Surgical History:  Procedure Laterality Date   AV FISTULA PLACEMENT Right 03/02/2021   Procedure: RIGHT ARM Radio-Cephalic  ARTERIOVENOUS (AV) FISTULA CREATION;  Surgeon:  Lanis Fonda BRAVO, MD;  Location: [facility] OR;  Service: Vascular;  Laterality: Right;   CAPD INSERTION N/A 05/20/2021   Procedure: LAPAROSCOPIC INSERTION CONTINUOUS AMBULATORY PERITONEAL DIALYSIS  CATHETER WITH POSSIBLE OMENTOPEXY;  Surgeon: Magda Debby SAILOR, MD;  Location: [facility] OR;  Service: Vascular;  Laterality: N/A;   IR FLUORO GUIDE CV LINE RIGHT  11/18/2020   IR US  GUIDE VASC ACCESS RIGHT  11/18/2020   NEPHRECTOMY     childhhod, RT side   PARATHYROIDECTOMY Left 05/31/2022   Procedure: TOTAL PARATHYROIDECTOMY WITH AUTOTRANSPLANT TO LEFT FOREARM;  Surgeon: Eletha Boas, MD;  Location: [facility] OR;  Service: General;  Laterality: Left;   QUADRICEPS TENDON REPAIR Right 10/29/2021   Procedure: RIGHT QUADRICEP REPAIR AND PRIMARY REPAIR ANTERIOR CAPSULE;  Surgeon: Doll Skates, MD;  Location: [facility] OR;  Service: Orthopedics;  Laterality: Right;   Social History:  reports that he has never smoked. He has never used smokeless tobacco. He reports that he does not currently use alcohol. He reports that he does not currently use drugs.  No Known Allergies  Family History  Problem Relation Age of Onset   Hypertension Mother    Kidney disease Maternal Grandmother     Prior to Admission medications   Medication Sig Start Date End Date Taking? Authorizing Provider  amLODipine -valsartan  (EXFORGE ) 10-160 MG tablet Take 1 tablet by mouth every evening. 04/29/23  Yes [provider]  calcitRIOL  (ROCALTROL ) 0.5 MCG capsule Take 4  capsules (2 mcg total) by mouth 2 (two) times daily. Take inbetween meals 06/11/22  Yes Ghimire, Donalda HERO, MD  gentamicin  cream (GARAMYCIN ) 0.1 % Apply 1 Application topically at bedtime as needed (infection prevention). 05/21/21  Yes [provider]  multivitamin (RENA-VIT) TABS tablet TAKE 1 TABLET BY MOUTH EVERYDAY AT BEDTIME Patient taking differently: Take 1 tablet by mouth daily with breakfast. 06/08/21  Yes Tanda Bleacher, MD    Physical Exam: Vitals:   05/12/23 1745  05/12/23 1800 05/12/23 1815 05/12/23 1830  BP: 133/89 131/79 126/87 125/83  Pulse: (!) 104 (!) 107 (!) 108 (!) 104  Resp: (!) 27 12 18 14   Temp:      TempSrc:      SpO2: 100% 100% 100% 100%  Weight:      Height:       General: Patient is alert and awake appears to be in no distress, mom at the bedside. Respiratory exam: Bilateral intravesicular Cardiovascular exam S1-S2 normal Abdomen all quadrant soft nontender Extremities warm without edema Pallor is notable on all nailbeds Data Reviewed:  Labs on Admission:  Results for orders placed or performed during the hospital encounter of 05/12/23 (from the past 24 hours)  Basic metabolic panel     Status: Abnormal   Collection Time: 05/12/23 10:13 AM  Result Value Ref Range   Sodium 136 135 - 145 mmol/L   Potassium 3.9 3.5 - 5.1 mmol/L   Chloride 93 (L) 98 - 111 mmol/L   CO2 25 22 - 32 mmol/L   Glucose, Bld 104 (H) 70 - 99 mg/dL   BUN 57 (H) 6 - 20 mg/dL   Creatinine, Ser 80.63 (H) 0.61 - 1.24 mg/dL   Calcium  9.1 8.9 - 10.3 mg/dL   GFR, Estimated 3 (L) >60 mL/min   Anion gap 18 (H) 5 - 15  CBC with Differential     Status: Abnormal   Collection Time: 05/12/23  5:23 PM  Result Value Ref Range   WBC 10.7 (H) 4.0 - 10.5 K/uL   RBC 1.91 (L) 4.22 - 5.81 MIL/uL   Hemoglobin 5.4 (LL) 13.0 - 17.0 g/dL   HCT 83.0 (L) 60.9 - 47.9 %   MCV 88.5 80.0 - 100.0 fL   MCH 28.3 26.0 - 34.0 pg   MCHC 32.0 30.0 - 36.0 g/dL   RDW 84.8 88.4 - 84.4 %   Platelets 411 (H) 150 - 400 K/uL   nRBC 0.0 0.0 - 0.2 %   Neutrophils Relative % 68 %   Neutro Abs 7.2 1.7 - 7.7 K/uL   Lymphocytes Relative 23 %   Lymphs Abs 2.5 0.7 - 4.0 K/uL   Monocytes Relative 6 %   Monocytes Absolute 0.7 0.1 - 1.0 K/uL   Eosinophils Relative 2 %   Eosinophils Absolute 0.3 0.0 - 0.5 K/uL   Basophils Relative 1 %   Basophils Absolute 0.1 0.0 - 0.1 K/uL   Immature Granulocytes 0 %   Abs Immature Granulocytes 0.04 0.00 - 0.07 K/uL  Type and screen  [facility]     Status: None (Preliminary result)   Collection Time: 05/12/23  7:18 PM  Result Value Ref Range   ABO/RH(D) B POS    Antibody Screen NEG    Sample Expiration 05/15/2023,2359    Unit Number [id number]    Blood Component Type RED CELLS,LR    Unit division 00    Status of Unit ISSUED    Transfusion Status OK TO TRANSFUSE  Crossmatch Result      Compatible Performed at [facility], [address] [place], [place] [address]    Unit Number [id number]    Blood Component Type RED CELLS,LR    Unit division 00    Status of Unit ALLOCATED    Transfusion Status OK TO TRANSFUSE    Crossmatch Result Compatible   Hemoglobin and hematocrit, blood     Status: Abnormal   Collection Time: 05/12/23  7:18 PM  Result Value Ref Range   Hemoglobin 4.9 (LL) 13.0 - 17.0 g/dL   HCT 84.5 (L) 60.9 - 47.9 %  Prepare RBC (crossmatch)     Status: None   Collection Time: 05/12/23  7:23 PM  Result Value Ref Range   Order Confirmation      ORDER PROCESSED BY BLOOD BANK Performed at [facility], [address] [place], [place] [address]    Basic Metabolic Panel: Recent Labs  Lab 05/12/23 1013  NA 136  K 3.9  CL 93*  CO2 25  GLUCOSE 104*  BUN 57*  CREATININE 19.36*  CALCIUM  9.1   Liver Function Tests: No results for input(s): AST, ALT, ALKPHOS, BILITOT, PROT, ALBUMIN  in the last 168 hours. No results for input(s): LIPASE, AMYLASE in the last 168 hours. No results for input(s): AMMONIA in the last 168 hours. CBC: Recent Labs  Lab 05/12/23 1723 05/12/23 1918  WBC 10.7*  --   NEUTROABS 7.2  --   HGB 5.4* 4.9*  HCT 16.9* 15.4*  MCV 88.5  --   PLT 411*  --    Cardiac Enzymes: No results for input(s): CKTOTAL, CKMB, CKMBINDEX, TROPONINIHS in the last 168 hours.  BNP (last 3 results) No results for input(s): PROBNP in the last 8760 hours. CBG: No results for input(s): GLUCAP in the last 168 hours.  Radiological Exams on  Admission:  DG Chest Port 1 View Result Date: 05/12/2023 CLINICAL DATA:  Fluid overload. EXAM: PORTABLE CHEST 1 VIEW COMPARISON:  November 13, 2020. FINDINGS: The heart size and mediastinal contours are within normal limits. Both lungs are clear. The visualized skeletal structures are unremarkable. Air is under right hemidiaphragm consistent with peritoneal dialysis. IMPRESSION: No active disease. Electronically Signed   By: Lynwood Landy Raddle M.D.   On: 05/12/2023 19:53     I/O last 3 completed shifts: In: 500 [IV Piggyback:500] Out: -  No intake/output data recorded.      Assessment and Plan: * Symptomatic anemia Patient's last CBC that we have is from 11 months ago with a hemoglobin of 10.2.  Patient does report that he had a more recent CBC done approximately 3 months ago which was 11.  Regardless patient's current hemoglobin is 5.4.  Normocytic normochromic.  No clinically significant bleed reported.  ER provider did do a rectal exam with finding of no melena - there note Is pending transcription.  Less I will avoid any anticoagulation at this time given precarious status of the patient.  I have ordered a full anemia workup including iron B12 folate reticulocyte have to LFT as well as electrophoresis.  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  Patient ordered for 2 unit PRBC.    Dizziness This is patient's presenting symptom.  Patient correlates this with starting on valsartan  for hypertension.  At this time I will hold patient's valsartan .  Continue with amlodipine .  I think over the long-term once patient's anemia is corrected he could process  probably go back on valsartan .  Will monitor clinically.  I think dizziness is due to symptomatic anemia.  End stage renal disease [facility]) Engaged Dr Ephriam singh to help out with PD  Phos level pending.    Advance Care Planning:   Code Status: Full Code   Consults: renal  engaged.  Family Communication: mom at bedside. All questions answered.  Severity of Illness: The appropriate patient status for this patient is OBSERVATION. Observation status is judged to be reasonable and necessary in order to provide the required intensity of service to ensure the patient's safety. The patient's presenting symptoms, physical exam findings, and initial radiographic and laboratory data in the context of their medical condition is felt to place them at decreased risk for further clinical deterioration. Furthermore, it is anticipated that the patient will be medically stable for discharge from the hospital within 2 midnights of admission.   Author: Jacqulyn Divine, MD 05/12/2023 8:57 PM  For on call review [email or web address].

## 2023-05-13 DIAGNOSIS — D500 Iron deficiency anemia secondary to blood loss (chronic): Secondary | ICD-10-CM | POA: Diagnosis not present

## 2023-05-13 DIAGNOSIS — D649 Anemia, unspecified: Secondary | ICD-10-CM | POA: Diagnosis not present

## 2023-05-13 LAB — IRON AND TIBC
Iron: 158 ug/dL (ref 45–182)
Saturation Ratios: 81 % — ABNORMAL HIGH (ref 17.9–39.5)
TIBC: 196 ug/dL — ABNORMAL LOW (ref 250–450)
UIBC: 38 ug/dL

## 2023-05-13 LAB — CBC
HCT: 23.2 % — ABNORMAL LOW (ref 39.0–52.0)
Hemoglobin: 7.7 g/dL — ABNORMAL LOW (ref 13.0–17.0)
MCH: 28.7 pg (ref 26.0–34.0)
MCHC: 33.2 g/dL (ref 30.0–36.0)
MCV: 86.6 fL (ref 80.0–100.0)
Platelets: 324 10*3/uL (ref 150–400)
RBC: 2.68 MIL/uL — ABNORMAL LOW (ref 4.22–5.81)
RDW: 15.1 % (ref 11.5–15.5)
WBC: 10.1 10*3/uL (ref 4.0–10.5)
nRBC: 0 % (ref 0.0–0.2)

## 2023-05-13 LAB — BASIC METABOLIC PANEL
Anion gap: 15 (ref 5–15)
BUN: 65 mg/dL — ABNORMAL HIGH (ref 6–20)
CO2: 25 mmol/L (ref 22–32)
Calcium: 8 mg/dL — ABNORMAL LOW (ref 8.9–10.3)
Chloride: 95 mmol/L — ABNORMAL LOW (ref 98–111)
Creatinine, Ser: 21.09 mg/dL — ABNORMAL HIGH (ref 0.61–1.24)
GFR, Estimated: 3 mL/min — ABNORMAL LOW (ref >60)
Glucose, Bld: 95 mg/dL (ref 70–99)
Potassium: 4.5 mmol/L (ref 3.5–5.1)
Sodium: 135 mmol/L (ref 135–145)

## 2023-05-13 LAB — HEPATIC FUNCTION PANEL
ALT: 14 U/L (ref 0–44)
AST: 12 U/L — ABNORMAL LOW (ref 15–41)
Albumin: 2.8 g/dL — ABNORMAL LOW (ref 3.5–5.0)
Alkaline Phosphatase: 59 U/L (ref 38–126)
Bilirubin, Direct: <0.1 mg/dL (ref 0.0–0.2)
Total Bilirubin: 0.6 mg/dL (ref 0.0–1.2)
Total Protein: 6 g/dL — ABNORMAL LOW (ref 6.5–8.1)

## 2023-05-13 LAB — FOLATE: Folate: 13.2 ng/mL (ref >5.9)

## 2023-05-13 LAB — APTT: aPTT: 28 s (ref 24–36)

## 2023-05-13 LAB — RETICULOCYTES
Immature Retic Fract: 13.5 % (ref 2.3–15.9)
RBC.: 2.63 MIL/uL — ABNORMAL LOW (ref 4.22–5.81)
Retic Count, Absolute: 53.1 10*3/uL (ref 19.0–186.0)
Retic Ct Pct: 2 % (ref 0.4–3.1)

## 2023-05-13 LAB — PROTIME-INR
INR: 1.2 (ref 0.8–1.2)
Prothrombin Time: 15.3 s — ABNORMAL HIGH (ref 11.4–15.2)

## 2023-05-13 LAB — FERRITIN: Ferritin: 1622 ng/mL — ABNORMAL HIGH (ref 24–336)

## 2023-05-13 LAB — VITAMIN B12: Vitamin B-12: 965 pg/mL — ABNORMAL HIGH (ref 180–914)

## 2023-05-13 LAB — PHOSPHORUS: Phosphorus: 9 mg/dL — ABNORMAL HIGH (ref 2.5–4.6)

## 2023-05-13 MED ORDER — GENTAMICIN SULFATE 0.1 % EX CREA
1.0000 | TOPICAL_CREAM | Freq: Every day | CUTANEOUS | Status: DC
  Filled 2023-05-13: qty 15

## 2023-05-13 MED ORDER — DELFLEX-LC/1.5% DEXTROSE 344 MOSM/L IP SOLN
INTRAPERITONEAL | Status: DC
Start: 2023-05-13 — End: 2023-05-13

## 2023-05-13 MED ORDER — AMLODIPINE BESYLATE 10 MG PO TABS: 10.0000 mg | ORAL_TABLET | Freq: Every day | ORAL | 0 refills | Status: DC

## 2023-05-13 NOTE — Care Management Obs Status (Signed)
 MEDICARE OBSERVATION STATUS NOTIFICATION   Patient Details  Name: Micheal Bean MRN: [id number] Date of Birth: 02-15-1986   Medicare Observation Status Notification Given:  Yes    Tom-Johnson, Angelique Ken, RN 05/13/2023, 2:29 PM

## 2023-05-13 NOTE — Progress Notes (Addendum)
 [facility] Progress Note   Subjective:    Seen and examined patient at bedside. A family member also at bedside. Noted Hgb of 4.9 at admit and he received 2 units PRBCs. Hgb now is 7.7. He reports feeling better after the transfusion. He remains on RA and current BP is controlled. He denies SOB, CP, ABD pain, and N/V. Also denies any issues with his PD in outpatient. Plan for PD tonight.  Objective Vitals:   05/13/23 0108 05/13/23 0417 05/13/23 0535 05/13/23 0753  BP: 123/81 123/79 123/81 121/74  Pulse: 96 97 95 95  Resp:   18 18  Temp: 98.1 F (36.7 C) 98.3 F (36.8 C) 98.2 F (36.8 C) 98.6 F (37 C)  TempSrc: Oral Oral  Oral  SpO2: 100% 100% 100% 100%  Weight:      Height:       Physical Exam General: Awake, alert, on RA, NAD Heart: S1 and S2; No murmurs, gallops, or rubs Lungs: Clear throughout; No wheezing, rales, or rhonchi Abdomen: Soft and non-tender Extremities: No LE edema Dialysis Access: PD catheter; dressing CDI   Filed Weights   05/12/23 0958  Weight: 81.6 kg    Intake/Output Summary (Last 24 hours) at 05/13/2023 0919 Last data filed at 05/13/2023 0900 Gross per 24 hour  Intake 1568.08 ml  Output 0 ml  Net 1568.08 ml    Additional Objective Labs: Basic Metabolic Panel: Recent Labs  Lab 05/12/23 1013 05/13/23 0601  NA 136 135  K 3.9 4.5  CL 93* 95*  CO2 25 25  GLUCOSE 104* 95  BUN 57* 65*  CREATININE 19.36* 21.09*  CALCIUM  9.1 8.0*  PHOS  --  9.0*   Liver Function Tests: Recent Labs  Lab 05/13/23 0601  AST 12*  ALT 14  ALKPHOS 59  BILITOT 0.6  PROT 6.0*  ALBUMIN  2.8*   No results for input(s): LIPASE, AMYLASE in the last 168 hours. CBC: Recent Labs  Lab 05/12/23 1723 05/12/23 1918 05/13/23 0601  WBC 10.7*  --  10.1  NEUTROABS 7.2  --   --   HGB 5.4* 4.9* 7.7*  HCT 16.9* 15.4* 23.2*  MCV 88.5  --  86.6  PLT 411*  --  324   Blood Culture    Component Value Date/Time   SDES BLOOD LEFT FOREARM 11/14/2020  2320   SPECREQUEST  11/14/2020 2320    BOTTLES DRAWN AEROBIC ONLY Blood Culture adequate volume   CULT  11/14/2020 2320    NO GROWTH 5 DAYS Performed at [facility], [address], [place], [place] [address]    REPTSTATUS 11/20/2020 FINAL 11/14/2020 2320    Cardiac Enzymes: No results for input(s): CKTOTAL, CKMB, CKMBINDEX, TROPONINI in the last 168 hours. CBG: No results for input(s): GLUCAP in the last 168 hours. Iron Studies:  Recent Labs    05/13/23 0601  IRON 158  TIBC 196*  FERRITIN 1,622*   Lab Results  Component Value Date   INR 1.2 05/13/2023   INR 1.2 11/25/2021   INR 1.5 (H) 11/13/2020   Studies/Results: DG Chest Port 1 View Result Date: 05/12/2023 CLINICAL DATA:  Fluid overload. EXAM: PORTABLE CHEST 1 VIEW COMPARISON:  November 13, 2020. FINDINGS: The heart size and mediastinal contours are within normal limits. Both lungs are clear. The visualized skeletal structures are unremarkable. Air is under right hemidiaphragm consistent with peritoneal dialysis. IMPRESSION: No active disease. Electronically Signed   By: Lynwood Landy Raddle M.D.   On: 05/12/2023 19:53  Medications:   sodium chloride    Intravenous Once   amLODipine   10 mg Oral Daily   calcitRIOL   2 mcg Oral BID   calcium  acetate  1,334 mg Oral TID WC   multivitamin  1 tablet Oral Q breakfast   sodium chloride  flush  3 mL Intravenous Q12H    Dialysis Orders: [facility], [facility] (Dr. Marlee).  Total cycler therapy time 10 hours 26 minutes.  Total cycler volume 18,800 mL.  Average drawl time 1 hour.  6 exchanges.  Night fill volume 2800 mL.  Last fill 2000 mL.  1 day exchange with 2000 mL. + Icodextrin.  EDW 74 kg.   Assessment/Plan:  ESRD -outpatient HD orders: [facility], [facility] (Dr. Marlee).  Total cycler therapy time 10 hours 26 minutes.  Total cycler volume 18,800 mL.  Average dwel time 1 hour.  6 exchanges.  Night fill volume 2800 mL.  Last fill 2000 mL.  1 day exchange with 2000 mL. + Icodextrin.   EDW 74 kg. -Patient remains euvolemic on exam. He is on RA and Bps are controlled. Will use all 1.5% bag with PD tonight. Plan to obtain a standing weight after PD tomorrow. Adjust bags if needed.   Volume/ hypertension  -BP acceptable, euvolemic on exam   Symptomatic anemia Anemia of Chronic Kidney Disease -S/p 2u prbc at admit for Hgb 4.9. Hgb now is 7.7. Fe panel pending -Transfuse PRN for Hgb <7 -work up per primary service   Secondary Hyperparathyroidism/Hyperphosphatemia History of parathyroidectomy - resume home calcitriol , on phoslo  2 tabs TIDAC-will continue   # Additional recommendations: - Dose all meds for creatinine clearance < 10 ml/min  - Unless absolutely necessary, no MRIs with gadolinium.  - Implement save arm precautions.  Prefer needle sticks in the dorsum of the hands or wrists.  No blood pressure measurements in arm. - If blood transfusion is requested during hemodialysis sessions, please alert us  prior to the session.  - If a hemodialysis catheter line culture is requested, please alert us  as only hemodialysis nurses are able to collect those specimens.   Charmaine Piety, NP [facility] 05/13/2023,9:19 AM  LOS: 0 days

## 2023-05-13 NOTE — TOC Transition Note (Signed)
 Transition of Care [facility]) - Discharge Note   Patient Details  Name: Micheal Bean MRN: [id number] Date of Birth: January 05, 1986  [facility]) CM/SW Contact:  Tom-Johnson, Abbeygail Igoe Daphne, RN Phone Number: 05/13/2023, 2:13 PM   Clinical Narrative:     Patient is scheduled for discharge today.  Hospital f/u and discharge instructions on AVS. No TOC needs or recommendations noted. Mother, Avelina to transport at discharge.  No further TOC needs noted.          Final next level of care: Home/Self Care Barriers to Discharge: Barriers Resolved   Patient Goals and CMS Choice Patient states their goals for this hospitalization and ongoing recovery are:: To return home CMS Medicare.gov Compare Post Acute Care list provided to:: Patient Choice offered to / list presented to : NA      Discharge Placement                Patient to be transferred to facility by: Mother Name of family member notified: Avelina    Discharge Plan and Services Additional resources added to the After Visit Summary for                  DME Arranged: N/A DME Agency: NA       HH Arranged: NA HH Agency: NA        Social Drivers of Health (SDOH) Interventions SDOH Screenings   Food Insecurity: No Food Insecurity (05/12/2023)  Housing: Unknown (05/12/2023)  Transportation Needs: No Transportation Needs (05/12/2023)  Utilities: Not At Risk (06/08/2022)  Depression (PHQ2-9): Medium Risk (01/12/2021)  Tobacco Use: Low Risk  (05/04/2023)   Received from [facility]     Readmission Risk Interventions     No data to display

## 2023-05-13 NOTE — Progress Notes (Signed)
 DISCHARGE NOTE HOME Ho L Reiswig to be discharged Home per MD order. Discussed prescriptions and follow up appointments with the patient. Prescriptions given to patient; medication list explained in detail. Patient verbalized understanding.  Skin clean, dry and intact without evidence of skin break down, no evidence of skin tears noted. IV catheter discontinued intact. Site without signs and symptoms of complications. Dressing and pressure applied. Pt denies pain at the site currently. No complaints noted.  Patient free of lines, drains, and wounds.   An After Visit Summary (AVS) was printed and given to the patient. Patient escorted via wheelchair, and discharged home via private auto.  Doyal Sias, RN

## 2023-05-13 NOTE — Discharge Summary (Signed)
 Physician Discharge Summary  Micheal Bean FMW:[id number] DOB: [birth year] DOA: 05/12/2023  PCP: Tanda Bleacher, MD  Admit date: 05/12/2023 Discharge date: 05/13/2023  Admitted From: Home Discharge disposition: Home  Recommendations at discharge:  Valsartan  has been stopped.  Continue amlodipine . Monitor for any evidence of bleeding at home Follow-up with nephrology as outpatient   Brief narrative: Micheal Bean is a 38 y.o. male with PMH significant for ESRD on PD, chronic anemia, h/o parathyroidectomy. 2/6, patient presented to the ED with persistent dizziness. Patient reports that about 2 weeks ago he had a change in his blood pressure medication from pure amlodipine  to amlodipine  plus valsartan . Since then patient reports a sensation of dizziness.   In the ED, patient was afebrile, heart rate 117, blood pressure in 100s, and was orthostatic per ED note. initial labs with WBC count 10.7, hemoglobin 5.4 which was further down to 4.9 on recheck Given IV fluid, 2 units of PRBC transfusion.  Subjective: Patient was seen and examined this morning. He on African-American male.  Lying on bed.  Not in distress.  This morning, he was able to get up and walk to the bathroom.  Did not get dizzy.  Received 2 units of PRBCs overnight. Chart reviewed No fever, remains hemodynamically stable. Hemoglobin better at 7.7 today  Hospital course: Symptomatic anemia Acute on chronic anemia Baseline hemoglobin of 10.2 in March 2024  Presented with hemoglobin significant low at 4.9  Patient reports he was having minimal intermittent epistaxis for the last 4 days but only when he blows his nose in the morning.  Denies any melena, hematemesis, hematuria, PD catheter site bleeding. Anemia panel obtained.  Ferritin level high, not suggestive of any bleeding. 2 units of PRBC transfusion given in the ED. Erythropoietin  per nephrology. Follow-up with nephrology as an outpatient.  Noted plan to Hemoccult  test and repeat labs in 1 week. Recent Labs    06/02/22 1144 06/08/22 1332 06/08/22 1810 06/09/22 0631 05/12/23 1723 05/12/23 1918 05/13/23 0601  HGB  --    < > 8.8* 10.2* 5.4* 4.9* 7.7*  MCV  --    < > 95.6 91.4 88.5  --  86.6  VITAMINB12  --   --   --   --   --   --  965*  FOLATE  --   --   --   --   --   --  13.2  FERRITIN 731*  --   --   --   --   --  1,622*  TIBC 134*  --   --   --   --   --  196*  IRON 23*  --   --   --   --   --  158  RETICCTPCT  --   --   --   --   --   --  2.0   < > = values in this interval not displayed.   Progressive dizziness Orthostatic hypotension H/o hypertension Presented with progressive dizziness for the last 2 weeks since addition of valsartan  to amlodipine . Orthostatic vital signs positive in the ED. Dizziness was likely due to combination of profound anemia and hypotension Valsartan  was stopped.  Amlodipine  10 mg daily continued. Continue to monitor blood pressure at home.   ESRD on PD Nephrology follow-up as an outpatient.   Calcitriol , PhosLo    Mobility: Encourage ambulation  Goals of care   Code Status: Full Code   Diet:  Diet Order  Diet general           Diet regular Room service appropriate? Yes; Fluid consistency: Thin  Diet effective now                   Nutritional status:  Body mass index is 22.5 kg/m.       Wounds:  - Incision - 4 Ports Abdomen Right;Upper Right;Lateral Upper;Left Superior;Left (Active)  Placement Date/Time: 05/20/21 1637   Location of Ports: Abdomen  Location Orientation: Right;Upper  Location Orientation: Right;Lateral  Location Orientation: Upper;Left  Location Orientation: Superior;Left    Assessments 05/20/2021  4:55 PM 05/20/2021  5:25 PM  Port 1 Site Assessment Clean;Dry Clean;Dry  Port 1 Margins Attached edges (approximated) Attached edges (approximated)  Port 1 Drainage Amount None None  Port 1 Dressing Type Liquid skin adhesive Liquid skin adhesive  Port 1  Dressing Status Clean, Dry, Intact Clean, Dry, Intact  Port 2 Site Assessment Clean;Dry Clean;Dry  Port 2 Margins Attached edges (approximated) Attached edges (approximated)  Port 2 Drainage Amount None None  Port 2 Dressing Type Liquid skin adhesive Liquid skin adhesive  Port 2 Dressing Status Clean, Dry, Intact Clean, Dry, Intact  Port 3 Site Assessment Clean;Dry Clean;Dry  Port 3 Margins Attached edges (approximated) Attached edges (approximated)  Port 3 Drainage Amount None None  Port 3 Dressing Type Liquid skin adhesive Liquid skin adhesive  Port 3 Dressing Status Clean, Dry, Intact Clean, Dry, Intact  Port 4 Site Assessment Clean;Dry Clean;Dry  Port 4 Margins Attached edges (approximated) Attached edges (approximated)  Port 4 Drainage Amount None None  Port 4 Dressing Type Liquid skin adhesive Liquid skin adhesive  Port 4 Dressing Status Clean, Dry, Intact Clean, Dry, Intact     No associated orders.    Discharge Exam:   Vitals:   05/13/23 0108 05/13/23 0417 05/13/23 0535 05/13/23 0753  BP: 123/81 123/79 123/81 121/74  Pulse: 96 97 95 95  Resp:   18 18  Temp: 98.1 F (36.7 C) 98.3 F (36.8 C) 98.2 F (36.8 C) 98.6 F (37 C)  TempSrc: Oral Oral  Oral  SpO2: 100% 100% 100% 100%  Weight:      Height:        Body mass index is 22.5 kg/m.  General exam: Pleasant, young African-American male Skin: No rashes, lesions or ulcers. HEENT: Atraumatic, normocephalic, no obvious bleeding Lungs: Clear to auscultation bilaterally,  CVS: S1, S2, no murmur,   GI/Abd: Soft, nontender, nondistended, bowel sound present,   CNS: Alert, awake, oriented x 3 Psychiatry: Mood appropriate,  Extremities: No pedal edema, no calf tenderness,   Follow ups:    Follow-up Information     Tanda Bleacher, MD Follow up.   Specialty: Family Medicine Contact information: [address] [place] [address] [phone]                 Discharge Instructions:    Discharge Instructions     Call MD for:  difficulty breathing, headache or visual disturbances   Complete by: As directed    Call MD for:  extreme fatigue   Complete by: As directed    Call MD for:  hives   Complete by: As directed    Call MD for:  persistant dizziness or light-headedness   Complete by: As directed    Call MD for:  persistant nausea and vomiting   Complete by: As directed    Call MD for:  severe uncontrolled pain   Complete  by: As directed    Call MD for:  temperature >100.4   Complete by: As directed    Diet general   Complete by: As directed    Renal diet   Discharge instructions   Complete by: As directed    Recommendations at discharge:   Valsartan  has been stopped.  Continue amlodipine .  Monitor for any evidence of bleeding at home  Follow-up with nephrology as outpatient  General discharge instructions: Follow with Primary MD Tanda Bleacher, MD in 7 days  Please request your PCP  to go over your hospital tests, procedures, radiology results at the follow up. Please get your medicines reviewed and adjusted.  Your PCP may decide to repeat certain labs or tests as needed. Do not drive, operate heavy machinery, perform activities at heights, swimming or participation in water  activities or provide baby sitting services if your were admitted for syncope or siezures until you have seen by Primary MD or a Neurologist and advised to do so again. [place]  Controlled Substance Reporting System database was reviewed. Do not drive, operate heavy machinery, perform activities at heights, swim, participate in water  activities or provide baby-sitting services while on medications for pain, sleep and mood until your outpatient physician has reevaluated you and advised to do so again.  You are strongly recommended to comply with the dose, frequency and duration of prescribed medications. Activity: As tolerated with Full fall precautions use walker/cane & assistance  as needed Avoid using any recreational substances like cigarette, tobacco, alcohol, or non-prescribed drug. If you experience worsening of your admission symptoms, develop shortness of breath, life threatening emergency, suicidal or homicidal thoughts you must seek medical attention immediately by calling 911 or calling your MD immediately  if symptoms less severe. You must read complete instructions/literature along with all the possible adverse reactions/side effects for all the medicines you take and that have been prescribed to you. Take any new medicine only after you have completely understood and accepted all the possible adverse reactions/side effects.  Wear Seat belts while driving. You were cared for by a hospitalist during your hospital stay. If you have any questions about your discharge medications or the care you received while you were in the hospital after you are discharged, you can call the unit and ask to speak with the hospitalist or the covering physician. Once you are discharged, your primary care physician will handle any further medical issues. Please note that NO REFILLS for any discharge medications will be authorized once you are discharged, as it is imperative that you return to your primary care physician (or establish a relationship with a primary care physician if you do not have one).   Discharge wound care:   Complete by: As directed    Increase activity slowly   Complete by: As directed        Discharge Medications:   Allergies as of 05/13/2023   No Known Allergies      Medication List     STOP taking these medications    amLODipine -valsartan  10-160 MG tablet Commonly known as: EXFORGE  Replaced by: amLODipine  10 MG tablet       TAKE these medications    amLODipine  10 MG tablet Commonly known as: NORVASC  Take 1 tablet (10 mg total) by mouth daily. Start taking on: May 14, 2023 Replaces: amLODipine -valsartan  10-160 MG tablet   calcitRIOL  0.5  MCG capsule Commonly known as: ROCALTROL  Take 4 capsules (2 mcg total) by mouth 2 (two) times daily. Take inbetween meals  What changed:  how much to take when to take this additional instructions   Calcium  Acetate 667 MG Tabs Take 667-1,334 mg by mouth See admin instructions. Take 2 tablets three times a day with each meal. Take 1 tablet with each snack.   gentamicin  cream 0.1 % Commonly known as: GARAMYCIN  Apply 1 Application topically at bedtime as needed (infection prevention).   multivitamin Tabs tablet TAKE 1 TABLET BY MOUTH EVERYDAY AT BEDTIME What changed: See the new instructions.   Xphozah  30 MG Tabs Generic drug: Tenapanor  HCl (CKD) Take 30 mg by mouth in the morning and at bedtime.               Discharge Care Instructions  (From admission, onward)           Start     Ordered   05/13/23 0000  Discharge wound care:        05/13/23 1327             The results of significant diagnostics from this hospitalization (including imaging, microbiology, ancillary and laboratory) are listed below for reference.    Procedures and Diagnostic Studies:   DG Chest Port 1 View Result Date: 05/12/2023 CLINICAL DATA:  Fluid overload. EXAM: PORTABLE CHEST 1 VIEW COMPARISON:  November 13, 2020. FINDINGS: The heart size and mediastinal contours are within normal limits. Both lungs are clear. The visualized skeletal structures are unremarkable. Air is under right hemidiaphragm consistent with peritoneal dialysis. IMPRESSION: No active disease. Electronically Signed   By: Lynwood Landy Raddle M.D.   On: 05/12/2023 19:53     Labs:   Basic Metabolic Panel: Recent Labs  Lab 05/12/23 1013 05/13/23 0601  NA 136 135  K 3.9 4.5  CL 93* 95*  CO2 25 25  GLUCOSE 104* 95  BUN 57* 65*  CREATININE 19.36* 21.09*  CALCIUM  9.1 8.0*  PHOS  --  9.0*   GFR Estimated Creatinine Clearance: 5.6 mL/min (A) (by C-G formula based on SCr of 21.09 mg/dL (H)). Liver Function  Tests: Recent Labs  Lab 05/13/23 0601  AST 12*  ALT 14  ALKPHOS 59  BILITOT 0.6  PROT 6.0*  ALBUMIN  2.8*   No results for input(s): LIPASE, AMYLASE in the last 168 hours. No results for input(s): AMMONIA in the last 168 hours. Coagulation profile Recent Labs  Lab 05/13/23 0601  INR 1.2    CBC: Recent Labs  Lab 05/12/23 1723 05/12/23 1918 05/13/23 0601  WBC 10.7*  --  10.1  NEUTROABS 7.2  --   --   HGB 5.4* 4.9* 7.7*  HCT 16.9* 15.4* 23.2*  MCV 88.5  --  86.6  PLT 411*  --  324   Cardiac Enzymes: No results for input(s): CKTOTAL, CKMB, CKMBINDEX, TROPONINI in the last 168 hours. BNP: Invalid input(s): POCBNP CBG: No results for input(s): GLUCAP in the last 168 hours. D-Dimer No results for input(s): DDIMER in the last 72 hours. Hgb A1c No results for input(s): HGBA1C in the last 72 hours. Lipid Profile No results for input(s): CHOL, HDL, LDLCALC, TRIG, CHOLHDL, LDLDIRECT in the last 72 hours. Thyroid function studies No results for input(s): TSH, T4TOTAL, T3FREE, THYROIDAB in the last 72 hours.  Invalid input(s): FREET3 Anemia work up Recent Labs    05/13/23 0601  VITAMINB12 965*  FOLATE 13.2  FERRITIN 1,622*  TIBC 196*  IRON 158  RETICCTPCT 2.0   Microbiology No results found for this or any previous visit (from the past 240 hours).  Time coordinating discharge: 45 minutes  Signed: Katira Dumais  [facility] 05/13/2023, 1:28 PM

## 2023-05-14 LAB — BPAM RBC
Blood Product Expiration Date: 202503092359
Blood Product Expiration Date: 202503092359
ISSUE DATE / TIME: 202502062102
ISSUE DATE / TIME: 202502070035
Unit Type and Rh: 7300
Unit Type and Rh: 7300

## 2023-05-14 LAB — TYPE AND SCREEN
ABO/RH(D): B POS
Antibody Screen: NEGATIVE
Unit division: 0
Unit division: 0

## 2023-05-15 LAB — HAPTOGLOBIN: Haptoglobin: 112 mg/dL (ref 17–317)

## 2023-05-15 LAB — ERYTHROPOIETIN: Erythropoietin: 11.6 m[IU]/mL (ref 2.6–18.5)

## 2023-05-16 ENCOUNTER — Telehealth: Payer: Self-pay

## 2023-05-16 NOTE — Transitions of Care (Post Inpatient/ED Visit) (Signed)
   05/16/2023  Name: Micheal Bean MRN: [id number] DOB: 22-Jul-1985  Today's TOC FU Call Status: Today's TOC FU Call Status:: Successful TOC FU Call Completed TOC FU Call Complete Date: 05/16/23 Patient's Name and Date of Birth confirmed.  Transition Care Management Follow-up Telephone Call Date of Discharge: 05/13/23 Discharge Facility: [facility]) Type of Discharge: Inpatient Admission Primary Inpatient Discharge Diagnosis:: symptomatic anemia How have you been since you were released from the hospital?: Better Any questions or concerns?: No  Items Reviewed: Did you receive and understand the discharge instructions provided?: Yes Medications obtained,verified, and reconciled?: Partial Review Completed Reason for Partial Mediation Review: He said he has all of the medications except the amlodipine . He stated he went to pick it up at [facility] and was told it was not ready, to come back in a hour. He said he went back in 2 hours and it was not ready and they told him that they would not have it until 06/03/2023.  I explained to him that he should check again or call his nephrologist and explain the situation.  Dr Elvan Hamel is listed as PCP but he has not seen her in more than 2 years.  he said he understood and would check on it. Any new allergies since your discharge?: No Dietary orders reviewed?: No Do you have support at home?: Yes Name of Support/Comfort Primary Source: He said he has help at home but did not specify who.  Medications Reviewed Today: Medications Reviewed Today   Medications were not reviewed in this encounter     Home Care and Equipment/Supplies: Were Home Health Services Ordered?: No Any new equipment or medical supplies ordered?: No  Functional Questionnaire: Do you need assistance with bathing/showering or dressing?: No Do you need assistance with meal preparation?: No Do you need assistance with eating?: No Do you have difficulty maintaining continence: No Do  you need assistance with getting out of bed/getting out of a chair/moving?: No Do you have difficulty managing or taking your medications?: No (he said he is independent with home peritoneal dialysis.)  Follow up appointments reviewed: PCP Follow-up appointment confirmed?: No MD Provider Line Number:[phone] Given: No (he said he will call to schedule an appointment with Dr Elvan Hamel.  He then said he needed to get some things and now was not a good time to schedule the appointemnt.) Specialist Hospital Follow-up appointment confirmed?: Yes Date of Specialist follow-up appointment?: 05/19/23 Follow-Up Specialty Provider:: nephrology Do you need transportation to your follow-up appointment?: No Do you understand care options if your condition(s) worsen?: Yes-patient verbalized understanding    SIGNATURE Burnett Carson, RN

## 2023-05-17 LAB — PROTEIN ELECTROPHORESIS, SERUM
A/G Ratio: 1.3 (ref 0.7–1.7)
Albumin ELP: 3.1 g/dL (ref 2.9–4.4)
Alpha-1-Globulin: 0.2 g/dL (ref 0.0–0.4)
Alpha-2-Globulin: 0.6 g/dL (ref 0.4–1.0)
Beta Globulin: 0.8 g/dL (ref 0.7–1.3)
Gamma Globulin: 0.7 g/dL (ref 0.4–1.8)
Globulin, Total: 2.4 g/dL (ref 2.2–3.9)
Total Protein ELP: 5.5 g/dL — ABNORMAL LOW (ref 6.0–8.5)

## 2023-06-08 NOTE — Progress Notes (Signed)
 Subjective:  Micheal Bean is a 38 y.o. male with a history of ESRD, referred by Transplant for final evaluation before being placed on the transplant list.    His chief complaint for today's visit is 1. Urethral stricure disease                                                             2. ESRD for transplant clearance.   Micheal Bean's medications, allergies, past medical, past surgical, social and family histories were reviewed and updated as appropriate. Review of pertinent medical records was completed.   Meds:              amLODIPine  (NORVASC ) 10 mg tablet 1 tablet             calcitrioL  (ROCALTROL ) 0.5 mcg capsule             calcium  acetate (PHOSLO ) 667 mg (169 mg calcium ) capsule 3 times daily with meals            oxyBUTYnin (DITROPAN XL) 10 mg 24 hr tablet (Expired) 10 mg, Daily            sevelamer  carbonate (RENVELA ) 800 mg tablet 1,600 mg, 3 times daily with meals            cinacalcet (SENSIPAR) 30 mg tablet 2 tablet, Daily            gentamicin  (GARAMYCIN ) 0.1 % cream             Rena-Vite 0.8 mg tab tablet 1 tablet, Nightly            sildenafiL  (VIAGRA ) 100 mg tablet              He has no known allergies.   Micheal Bean  has a past medical history of Kidney failure.   He  has a past surgical history that includes Internal urethrotomy (N/A, 04/07/2023).    Micheal Bean  reports that he has never smoked. He has never used smokeless tobacco. He reports current drug use. Drug: Marijuana. He reports that he does not drink alcohol. His family history includes Diabetes in his maternal grandmother and mother.   HPI: 25 yo AA male with significant Urologic history:    1. 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 Left hydronephrosis noted on CT scan 2 years ago.         Urology evaluation showed obstructive uropathy, 2ndary urethral stricture. ( Note pt denies hx of gc).      3. ESRD, Rx dialysis. IPSS= 20 pre op.       4. Cystoscopy: anterior urethral stricture (distal bulbar). Would not admit flex scope. Dimentions 1 cm stricture, with 3mm lumen. He has undergone direct vision internal urethrotomy, with plans, if failed, to proceed with  post transplant urethroplasty using buccal mucosa ( Dr. Herb). Procedure accomplished 04/07/2023.      5. Patient has done videourodynamics that shows bladder pressure is normal.          He has significant reflux into a stump of the right distal ureter.           Detrusor pressure does not rise significantly with filling the bladder.  Detrusor pressure during the  voiding is 56.  Flow rate is weak Bladder has multiple diverticuli .Peak flow rate is 12.6.  EMG is not active during the voiding Diagnostic Cystoscopy  Social: single. Father 2 daughters.   Objective:  Constitutional:   BP 111/75 (BP Location: Right arm, Patient Position: Sitting)   Pulse 110   Temp 96.8 F (36 C) (Temporal)   SpO2 99%  There is no height or weight on file to calculate BMI.    Neurological:   The patient is alert and oriented.  He is in no apparent distress.   are no masses The bladder is nonpalpable There is no abdominal pain  Genitourinary:  Penis is uncircumcised without lesions or discharge normal normal, no masses There are palpable masses in either epididymis or vas deferens  There are no palpable inguinal hernias Skin:  normal  Lymphatic:  There is no inguinal lymphadenopathy    Labs/Imaging:    Lab Results  Component Value Date   GLUCOSEU Negative 03/16/2023   BILIRUBINUR Negative 03/16/2023   BLOODU Moderate (A) 03/16/2023   PHUR 7.0 03/16/2023   PROTEINUA 100 (A) 03/16/2023   NITRITE Negative 03/16/2023   LEUKOUA Negative 03/16/2023     In Office Procedures/Testing:   cystoscopy Dil Urethra Stric,Male,Initial  Date/Time: 06/08/2023 1:30 PM  Performed by: Arlena Chauncey Gal, MD Authorized by: Arlena Chauncey Gal, MD  Preparation: Patient was  prepped and draped in the usual sterile fashion. Local anesthesia used: no  Anesthesia: Local anesthesia used: no  Sedation: Patient sedated: no  Patient tolerance: patient tolerated the procedure well with no immediate complications Comments: Dilated anterior urethral stricture with flex cystoscopy.    Cystoscopy, Dil Urethral Stricture  Date/Time: 06/08/2023 1:30 PM  Performed by: Arlena Chauncey Gal, MD Authorized by: Arlena Chauncey Gal, MD  Preparation: Patient was prepped and draped in the usual sterile fashion. Local anesthesia used: no  Anesthesia: Local anesthesia used: no  Sedation: Patient sedated: no  Patient tolerance: patient tolerated the procedure well with no immediate complications Comments: Cystoscopy : circumcised penis. Anterior urethral dilated. No bleeding. Bulbar urethra wnl, proximal urethra wnl. Bladder neck wnl. Prostate: multiple benign bladder polyps. Bladder base: Trigone examined: no evidence bladder stone or tumor. 4+ trabeculation,  no cellule identified. Enlarged L and right ureteral orifices.    Urodynamic Testing - Uroflow and PVR  Date/Time: 06/08/2023 1:30 PM  Performed by: Arlena Chauncey Gal, MD Authorized by: Arlena Chauncey Gal, MD   Procedure discussed: discussed risks, benefits and alternatives   Chaperone present: yes   Timeout: timeout called immediately prior to procedure   Prep: patient was prepped and draped in usual sterile fashion   Position: sitting  Procedure Details    Procedure: Uroflow and bladder scan     Uroflow Details:    Complexity: simple   Pre-void volume (mL): 250   Voiding duration (sec): 57.3   Average flow rate (mL/sec): 4   Max flow rate (mL/sec): 6   Type of curve: flattened     Voided urine (mL): 160   Residual urine (mL): 82   Normal void for patient: yes    PVR Details:     Residual urine (mL): 82     Post-Procedure Details    Outcome: patient tolerated procedure well with  no complications     Post-procedure interventions: post-procedure instructions given      Assessment/Plan Anterior urethral stricture: dilated atraumatically Bladder neck polyps: benign. No Rx needed 3.   Thickened bladder wall. May be significant for  transplant ureter. Right ureteral stump.  4. Reduced peak urine flow of 6cc/sec. Normal residual urine. May need post op tamsulosin, etc.    We discussed history, surgeries, and potential for complications for transplantation, including difficulty with reimplantation of transplanted ureter. His urethral stricture is dilated easily.  Urodynamics show no reflux, bladder contractions.  He should continue to cath himself daily to keep the stricture open.    He is cleared for renal transplantation. Will send note to Dr. Herb.   I have personally spent 45 minutes involved in face-to-face and non-face-to-face activities for this patient on the day of the visit.  Professional time spent includes the following activities, in addition to those noted in the documentation: face-to-face obtaining history and performing examination, review of office notes from other physicians or providers, labs, imaging, counseling and educating the patient/family, ordering tests/treatments/procedures, care coordination, and/or documentation of the visit.  Electronically signed: Arlena Chauncey Gal, MD 06/08/2023  2:06 PM

## 2023-08-11 NOTE — Progress Notes (Signed)
 [facility]  Pre-Transplant Evaluation  PRIMARY CARE PROVIDER None (Inactive)  REFERRING PROVIDER Bernardino Green Gasman, MD  SITE OF VISIT [facility]   CHIEF COMPLAINT Kidney Transplant Evaluation   HISTORY OF PRESENT ILLNESS [name] is a 38 y.o. 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 he is accompanied by his mother.  He was seen for first evaluation in 04/2021 but was declined secondary to poor adherence to dialysis sessions.  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  Did not resolve and underwent right nephrectomy at age of 57 months.  Mother unsure if this was infected.  He reports no issues with UTIs growing up. Systemic Hypertension: No issues prior to starting dialysis.  Reports has just began to have BP issues once started dialysis.  NO CP or DOE.  Hx of obstructive uropathy: Noted to have left hydronephrosis on CT scan likely caused by urethral stricture.  He underwent cystoscopy and noted to have an anterior urethral stricture of 1 cm.   The rest of the urethra was normal with mild inflammation at prostate but not enlarged.  He was noted to have significant reflux into the stump of the right distal ureter and underwent anterior urethral dilatation.  He reports that he makes very little urine.  He has established with urology at [facility] health.  He last underwent cystoscopy in March 2025 which showed anterior urethra dilated, bulbar urethra normal,  proximal urethra normal, bladder neck normal, prostate with multiple benign bladder polyps, bladder base 4+ trabeculation.  He also went urodynamic testing that was normal emptying but reduced peak urine flow.  He does  cath himself once per day to keep his stricture open. Anemia: Patient had emergency room visit and admission in February 2025.  He was noted to have a hemoglobin of 4.9 and transfused 2 units of packed red blood cells.  He had reported having intermittent epistaxis for 4 days.  He denied having any melena, hema to emesis, hematochezia.  He reports he has had no further issues.  He is receiving ESA with his dialysis. History of parathyroidectomy: Patient reports that this occurred in November 2023.  He still continues to struggle with phosphorus control and is taking calcium  acetate, Renvela  and Xphozah  for this. He is also on sensipar 60mg  daily.         Renal Disease        . CKD/ESRD Timeline: Did not follow up with nephrology for a number of years. Developed weight loss, fatigue and weakness in 11/2020.  Diagnosed with ESRD and was initiated onto hemodialysis.  Transitioned to PD in January 2025.  Currently she is utilizing the cycler for 10 hours each night.  He uses a 2.5 with a 1.5% solution.  He does do a 2 L daytime dwell.  He has had no peritonitis.  Cardiovascular       . Exercise tolerance: Fair.  He does no structured exercise.  He believes he could walk roughly 1 mile before he would need to stop and rest.         . Cardiac history: No prior history of MI/CVA/congestive heart failure or arrhythmias.       . Last cardiac studies . Echo: This was last performed in April  2023.  Ejection fraction was noted to be 60%. . Stress test: This was performed in April 2023.  No evidence of ischemia. SABRA Heart catheterization: None       . Vascular                      . Claudication signs or symptoms: Patient denies                      . Vascular studies: No formal lower extremity vascular studies  Additional Medical History        . History of malignancy: None        . History of stroke: None        . History of lung cancer: Prior history of 10-pack-year of smoking however currently no inhaler  use.        SABRA History of liver disease: None        . History of GI disease: None        . History of blood disorders: Anemia in February 2025 required packed red blood cells.        . Antiplatelet agents: None        . Anticoagulation: None        . Past Surgeries: Right nephrectomy, AV fistula creation, PD catheter placement   Dental: Reports he has not seen a dentist in a number of years.  Cancer Screening        . Colonoscopy: Has never been completed and will be due when he turns 38 years old.        . PSA: Not age-appropriate.        . CT abdomen/pelvis: Completed in July 2024.        . Low dose chest CT: Completed in August 2022.  Functional Status       . Patient can perform activities of daily living including dressing, eating, ambulating, toileting, and hygiene): Yes       . Patient can perform instrumental activities of daily living including shopping, housekeeping, money management, food preparation, and transportation: Yes   Finances: Currently has Medicare AMB with Medicaid as secondary.   Care Giving Plan: He presents today with his mother who will be his primary caretaker.  REVIEW OF SYSTEMS A 12-point review of systems was performed and is detailed below.        . Constitutional: negative        . Eyes: negative        . Ears, Nose, Mouth, and Throat: negative        . Cardiovascular: negative        . Respiratory: negative        . Gastrointestinal: negative        . Genitourinary: negative        . Musculoskeletal: negative        . Integumentary: negative        . Neurological: negative        . Psychiatric: negative        . Endocrine: negative  IMMUNIZATIONS  There is no immunization history on file for this patient.  PAST MEDICAL HISTORY Past Medical History:  Diagnosis Date  . Chronic kidney disease     PROBLEM LIST Problem List  Date Reviewed: 08/11/2023          ICD-10-CM Priority Class Noted - Resolved Diagnosed   Secondary  hyperparathyroidism of renal origin (CMS/HHS-HCC) N25.81   05/04/2022 -  Present    Pre-transplant evaluation for kidney transplant Z01.818   06/21/2021 - Present    ESRD on hemodialysis (CMS/HHS-HCC) N18.6, Z99.2   06/21/2021 - Present    Osteomyelitis (CMS/HHS-HCC) M86.9   04/10/2021 - Present    AKI (acute kidney injury) () N17.9   11/14/2020 - Present    Acute lower UTI N39.0   11/13/2020 - Present    Acute renal failure (ARF) () N17.9   11/13/2020 - Present    Leukocytosis D72.829   11/13/2020 - Present    Bone lesion M89.9   11/13/2020 - Present     PAST SURGICAL HISTORY History reviewed. No pertinent surgical history.  SOCIAL HISTORY Social History   Tobacco Use  . Smoking status: Never  . Smokeless tobacco: Never  Substance Use Topics  . Alcohol use: Not Currently    Alcohol/week: 5.0 standard drinks of alcohol    Types: 5 Shots of liquor per week  . Drug use: Not Currently    Frequency: 7.0 times per week    Types: Marijuana    FAMILY HISTORY No family history on file.  CURRENT MEDICATIONS Outpatient Encounter Medications as of 08/09/2023  Medication Sig Dispense Refill  . amLODIPine -valsartan  (EXFORGE ) 10-160 mg tablet Take 1 tablet by mouth every evening    . b complex multivitamin (RENA-VITE) 0.8 mg Take 1 tablet by mouth at bedtime    . calcitRIOL  (ROCALTROL ) 0.5 MCG capsule Take 0.5 mcg by mouth once daily    . calcium  acetate,phosphat bind, (PHOSLO ) 667 mg capsule Take 1,334 mg by mouth 3 (three) times daily with meals    . cinacalcet (SENSIPAR) 30 MG tablet TAKE 2 TABLET BY MOUTH ONCE A DAY TAKE WITH LARGEST MEAL OF THE DAY    . cyanocobalamin  (VITAMIN B12) 1000 MCG tablet Take by mouth    . gentamicin  0.1 % cream Apply topically On dialysis days    . methoxy peg-epoetin beta (MIRCERA INJ) Inject 225 mcg subcutaneously    . sevelamer  carbonate (RENVELA ) 800 mg tablet Take 2,400 mg by mouth 3 (three) times daily with meals    . XPHOZAH  30 mg Tab Take 1 tablet by mouth  once daily    . cinacalcet (SENSIPAR) 30 MG tablet Take by mouth    . cyanocobalamin  (VITAMIN B12) 1000 MCG tablet Take 1,000 mcg by mouth once daily    . lanthanum  (FOSRENOL ) 1000 MG chewable tablet Take by mouth (Patient not taking: Reported on 04/10/2021)    . RENA-VITE 0.8 mg Take 1 tablet by mouth at bedtime    . sildenafiL  (VIAGRA ) 100 MG tablet Take by mouth (Patient not taking: Reported on 08/09/2023)     No facility-administered encounter medications on file as of 08/09/2023.    ALLERGIES No Known Allergies  PHYSICAL EXAM        . Constitutional                      .  General Appearance: African-American male who appears his stated age.  No acute distress.                      .  Vitals                              . BP (!) 145/100 (BP Location: Left upper arm, Patient Position: Sitting, BP Cuff Size: Adult)   Pulse 99   Temp 36.8 C (  98.3 F) (Oral)   Resp 16   Ht 184.5 cm (6' 0.64)   Wt 76.2 kg (167 lb 15.9 oz)   SpO2 100%   BMI 22.39 kg/m          . Neurological                     . Examination: Face symmetric, speech not dysarthric, moves all four extremities         . Psychiatric                       . Examination: The patient is awake, alert, and interactive                       . Mood and Affect: Flat affect.         . Head and Face                       . Examination: Normocephalic and atraumatic         . Eyes                       . Examination: Sclera anicteric and conjunctiva clear         . Ears, Nose, Mouth, and Throat                       . Examination: Oropharynx clear, moist mucous membranes, teeth in good repair, no oral ulcers or thrush         . Neck                      . General Examination: The neck is supple, no cervical lymphadenopathy                      . Examination of Jugular Veins: No JVD present         . Respiratory                     . Respiratory Effort: Normal work of breathing with no respiratory effort                      . Auscultation of Lungs: Full and clear breath sounds bilaterally           . Cardiovascular                       . Auscultation of Heart: Regular rhythm, no murmur, rub or gallop                       . Pulses: Normal distal pulses           . Gastrointestinal                      . Examination of Abdomen: Soft, non-tender, non-distended, with normoactive bowel sounds          . Lymphatic                      . Palpation: No cervical adenopathy          . Musculoskeletal                      .  Examination: Normal bulk and tone                      . Gait: The patient is able to stand easily. No assistance was needed on or off the exam table.           . Extremities                     . Examination: No pretibial, flank, or sacral edema          . Skin                     . Inspection: Warm, well-perfused for areas of exposed skin          . Dialysis Access                     . Examination: PD catheter exits the left abdomen.  No tenderness to palpation.  RESULTS Initial consult on 08/09/2023  Component Date Value Ref Range Status  . Albumin  08/09/2023 3.2 (L)  3.5 - 4.8 g/dL Final  . Cytomegalovirus (CMV) Antibody, IGG 08/09/2023 Negative  Negative Final  . Cytomegalovirus (CMV) Antibody, IGM 08/09/2023 Negative  Negative Final  . Creatinine 08/09/2023 20.0 (H)  0.6 - 1.3 mg/dL Final  . Glomerular Filtration Rate (eGFR)  08/09/2023 3  mL/min/1.73sq m Final   CKD-EPI (2021) does not include patient's race in the calculation of eGFR. Monitoring changes of plasma creatinine and eGFR over time is useful for monitoring kidney function.  This change was made on 06/03/2020.  Interpretive Ranges for eGFR(CKD-EPI 2021):   eGFR:              > 60 mL/min/1.73 sq m - Normal  eGFR:              30 - 59 mL/min/1.73 sq m - Moderately Decreased  eGFR:              15 - 29 mL/min/1.73 sq m - Severely Decreased  eGFR:              < 15 mL/min/1.73 sq m -  Kidney Failure   Note: These eGFR  calculations do not apply in acute situations  when eGFR is changing rapidly or in patients on dialysis.   . EBV Ab/VCA IgG 08/09/2023 Positive (!)  Negative Final  . EBV Ab/VCA IgM 08/09/2023 Negative  Negative Final  . EBV Ab/EBNA 08/09/2023 Positive (!)  Negative Final  . EBV Ab/EA IgG 08/09/2023 Negative  Negative Final  . Hepatitis B Core Antibody Total 08/09/2023 NonReactive  NonReactive Final  . Hepatitis B Surface Antibody 08/09/2023 Positive (!)  Negative Final  . Hepatitis B Surface Antibody Quant 08/09/2023 17.00  mIU/mL Final  . Hepatitis B Surface Antigen 08/09/2023 NonReactive  NonReactive Final  . Hepatitis C Virus Antibody 08/09/2023 NonReactive  NonReactive Final  . HIV 1&2 Antibody/Antigen 08/09/2023 NonReactive  NonReactive Final  . ABO RH TYPE 08/09/2023 B Positive   Final  . Antibody Screen 08/09/2023 Negative   Final  . Specimen Outdate 08/09/2023 08-12-2023 23:59   Final  . Performing Lab 08/09/2023 [facility]   Final  . Hepatitis A Virus, Antibody 08/09/2023 Negative  Negative Final  . Varicella-Zoster Antibody, IGG 08/09/2023 Positive  Positive Final  . Rubeola Antibodies, IgG 08/09/2023 72.3  Immune >16.4 AU/mL Final  Negative        <13.5                                  Equivocal 13.5 - 16.4                                  Positive        >16.4 Presence of antibodies to Rubeola is presumptive evidence of immunity except when acute infection is suspected.  . Mumps IgG Antibody 08/09/2023 101.0  Immune >10.9 AU/mL Final                                   Negative         <9.0                                 Equivocal  9.0 - 10.9                                 Positive        >10.9 A positive result generally indicates past exposure to Mumps virus or previous vaccination.  SABRA NIL 08/09/2023 0.09  IU/mL Final  . TB 1 Antigen 08/09/2023 0.07  IU/mL Final  . TB 2 Antigen 08/09/2023 0.08  IU/mL Final  . Mitogen 08/09/2023  10.00  IU/mL Final  . Quantiferon TB 08/09/2023 Negative  Negative Final  . NIL Rflx 08/09/2023 0.09  IU/mL Final  . TB 1 Antigen Rflx 08/09/2023 0.07  IU/mL Final  . TB 2 Antigen Rflx 08/09/2023 0.08  IU/mL Final  . Mitogen Rflx 08/09/2023 10.00  IU/mL Final  . QFT Rflx Instrument Trigger 08/09/2023 1.00   Final     CONSULTATION AND COLLABORATION Today I have spent 30 minutes reviewing records, including information from the referral, admission & discharge notes, last clinic notes, & other labs/studies.? I have reviewed smoking history, BMI, age appropriate cancer screening, & immunization history.?   I have spoken with my transplant coordinator to review the work up.  I have discussed the patient details with my Child psychotherapist, Automotive engineer.    ASSESSMENT AND PLAN Mr. Imperato is a 38 y.o. male is here to be considered for kidney transplantation. The patient's EPTS as of today is 9.? The patient was previously seen for evaluation in January 2023 and brought to committee in July 2023 with concerns in regards to poor adherence and missing dialysis sessions and financial concerns.  I will need to gather the feedback from other members of the team who met with him/her before making a decision. In addition, we will need to do the following testing:   Needed? Test or Consultation Location:  [x]   Colonoscopy:  This will be due when you have reached 38 years old.  If you begin to have drops in your hgb again, you may need a colonscopy to assess for ulcers.      Well Woman/PAP Smear:  Every year to every third year based on recommendations from your GYN or PCP.  Last Done:   Next Due.    []   Mammogram:  Last done Next due     []   Prostate Testing:  Done as part of yearly blood work if you are over 58 years old.   []   Pulmonary Function Testing    [x]   Echocardiogram:  To evaluate for right & left heart function, to rule out valvular heart disease, & to measure right & left heart  pressures.  Last Done: 2023  Next Due:  This is due now.  [facility] or [facility]  [x]    Cardiac stress testing (exercise, medical or adenosin/regadenson with echo or nuclear imaging; EKG evaluation is NOT sufficient):  To risk stratify for coronary artery disease in pre transplant patient with advanced CKD.  Last Done: 2023 Next Due: This is due now. Can be TM STRESS [facility]   [x]   CT Scan of the abdomen & pelvis without IV contrast to evaluate iliac vasculature calcification in kidney transplant candidate.  Last Done: 10/2022 Next Due: per surgery [facility]  []   Dermatology Consultation   []   Hepatology Consultation    []   Urology Consultation    []   Vascular Surgery Consultation    []   Other Consultation:  specify    []   Dental Exam/Clearance    [x]   Finances:  Review notes & recommendations from the financial coordinators.  How will you pay for medications?  How will you pay co pays?  How will you pay for travel & lodging?   [facility]  []   Medical Records:  Specify what & from where                [x]   Social Work:  Social Work: you will need a solid plan for how you will get to the clinic regularly, who will help you with your wound care, who will fill your pill box & how will you get nutrition; many of these things you will be able to do for yourself after a time, but early after your transplant, you will need a lot of help. [facility]  [x]   Other Requirements:  Yearly follow up. Stay as active as possible.  Please aim to get any form of exercise that raises your heart rate & keeps it up for @ least 30 minutes on as many days of the week as possible. [facility]  [x]   Immunizations:  NEEDS MMR         Patient Education and Counseling: Today I discussed the benefits and risks of kidney transplant?for the treatment of advanced chronic kidney disease.? Our surgical colleagues have reviewed the surgical risks in more detail, but I did discuss the need for inpatient hospitalization, as well as surgical risks including?pain,  bleeding, infections, and potential need for surgical drains.? I spend the majority of our discussion of: the importance of immune suppression to prevent transplant rejection;  the need for frequent lab monitoring, clinic visits, and potentially kidney transplant biopsies to monitor transplant function; and the side effects of immune suppression (increased risk of infections and cancers, high blood pressure, elevated cholesterol, neuropathies (headaches, tremors, altered cognition, worsening of mental health, sleep disturbances, etc.),?decreased blood cell production, GI disturbance (mostly diarrhea), and worsening or new diabetes).? We discussed the risks of not taking these medications, including: rejection, organ damage, severe enough to need dialysis; and several illness that could result in death.  I informed the patient that approximately 1 of 3 deceased donor transplant at [facility] require short term dialysis post-transplant as well as informed them of the risk of primary non function.       We also discussed waiting times for a deceased donor  organ & ways to shorten this, including: multi listing at other centers; high KDPI offers and the impacts on expected allograft half life; increased risk offers and the very low risk of acquiring HIV, hepatitis B or C;?and ?living donation. We discussed the shorter timeline to transplant and increased flexibility afforded by a living donor given current regional and national wait times, as well as the better?short- and long-term?outcomes. We also reviewed options for incompatible transplantation offered at [facility] including: paired donor exchange; desensitization (for HLA incompatibility); and ABO-incompatible transplantation. With those in mind, I recommended that he have all interested donors contact us  regardless of blood type.  Kidney Transplant Strengths Young age Relatively close proximity to [facility]  Kidney Transplant Risks Non-adherence to phosphorus binders Hx  of urethral stricture--may need more frequent catherization post transplant Financial concerns  Anticipated Complications after TxP Concern about adherence to immunosuppression medication  The induction therapy will be steroids if PRA<30% and thymoglobulin if PRA>30%        . Sensitizing events include: Blood transfusion  The patient will accept blood products and has no religious beliefs that would prevent this.   Organ Criteria The patient is not a candidate for high KDPI kidneys d/t age.  The patient has consented for hepatitis C Ab+/NAT+ donors. The patient did not consent to hepatitis B donors.   Transplant Candidate Report Variable: SABRA Diabetes Mellitus:  NO . Peripheral vascular disease:  NO . Malignancy: NO   INSTRUCTIONS     . Return to this clinic in one year for their annual?evaluation, but?anticipate being in close contact as we complete the balance of their evaluation.?   FUTURE ORDERS AND APPOINTMENTS Future Orders No orders of the defined types were placed in this encounter.    Future Appointments Future Appointments   This patient does not currently have any appointments scheduled.    I personally performed the service. The service was performed with a teaching provider immediately available.    Loretta J. Orlando ANP  Transplant Nephrology [phone]   PROVIDER SIGNATURE ROSELINE DUANA ORLANDO, NP  This note was generated in part with voice recognition software and I apologize for any typographical errors that were not detected and corrected.

## 2023-10-21 ENCOUNTER — Encounter: Payer: Self-pay | Admitting: Advanced Practice Midwife

## 2023-11-26 ENCOUNTER — Emergency Department (HOSPITAL_COMMUNITY)

## 2023-11-26 ENCOUNTER — Other Ambulatory Visit: Payer: Self-pay

## 2023-11-26 ENCOUNTER — Encounter (HOSPITAL_COMMUNITY): Payer: Self-pay | Admitting: *Deleted

## 2023-11-26 ENCOUNTER — Inpatient Hospital Stay (HOSPITAL_COMMUNITY)
Admission: EM | Admit: 2023-11-26 | Discharge: 2023-11-29 | DRG: 907 | Disposition: A | Discharge status: Home / Self Care | Attending: Internal Medicine | Admitting: Internal Medicine

## 2023-11-26 ENCOUNTER — Observation Stay (HOSPITAL_COMMUNITY)

## 2023-11-26 DIAGNOSIS — N19 Unspecified kidney failure: Secondary | ICD-10-CM | POA: Diagnosis present

## 2023-11-26 DIAGNOSIS — B957 Other staphylococcus as the cause of diseases classified elsewhere: Secondary | ICD-10-CM | POA: Diagnosis present

## 2023-11-26 DIAGNOSIS — T8241XA Breakdown (mechanical) of vascular dialysis catheter, initial encounter: Secondary | ICD-10-CM | POA: Diagnosis not present

## 2023-11-26 DIAGNOSIS — K0889 Other specified disorders of teeth and supporting structures: Secondary | ICD-10-CM | POA: Diagnosis present

## 2023-11-26 DIAGNOSIS — T8571XA Infection and inflammatory reaction due to peritoneal dialysis catheter, initial encounter: Secondary | ICD-10-CM | POA: Diagnosis present

## 2023-11-26 DIAGNOSIS — T85691A Other mechanical complication of intraperitoneal dialysis catheter, initial encounter: Secondary | ICD-10-CM | POA: Diagnosis not present

## 2023-11-26 DIAGNOSIS — Y828 Other medical devices associated with adverse incidents: Secondary | ICD-10-CM | POA: Diagnosis present

## 2023-11-26 DIAGNOSIS — Z992 Dependence on renal dialysis: Secondary | ICD-10-CM | POA: Diagnosis not present

## 2023-11-26 DIAGNOSIS — Z9089 Acquired absence of other organs: Secondary | ICD-10-CM

## 2023-11-26 DIAGNOSIS — N2581 Secondary hyperparathyroidism of renal origin: Secondary | ICD-10-CM | POA: Diagnosis present

## 2023-11-26 DIAGNOSIS — Z9889 Other specified postprocedural states: Secondary | ICD-10-CM

## 2023-11-26 DIAGNOSIS — D631 Anemia in chronic kidney disease: Secondary | ICD-10-CM | POA: Diagnosis present

## 2023-11-26 DIAGNOSIS — T85611A Breakdown (mechanical) of intraperitoneal dialysis catheter, initial encounter: Principal | ICD-10-CM | POA: Diagnosis present

## 2023-11-26 DIAGNOSIS — N186 End stage renal disease: Principal | ICD-10-CM | POA: Diagnosis present

## 2023-11-26 DIAGNOSIS — Z8249 Family history of ischemic heart disease and other diseases of the circulatory system: Secondary | ICD-10-CM

## 2023-11-26 DIAGNOSIS — Z79899 Other long term (current) drug therapy: Secondary | ICD-10-CM

## 2023-11-26 DIAGNOSIS — Z841 Family history of disorders of kidney and ureter: Secondary | ICD-10-CM

## 2023-11-26 DIAGNOSIS — E871 Hypo-osmolality and hyponatremia: Secondary | ICD-10-CM | POA: Diagnosis present

## 2023-11-26 DIAGNOSIS — R109 Unspecified abdominal pain: Secondary | ICD-10-CM | POA: Diagnosis not present

## 2023-11-26 DIAGNOSIS — I120 Hypertensive chronic kidney disease with stage 5 chronic kidney disease or end stage renal disease: Secondary | ICD-10-CM | POA: Diagnosis present

## 2023-11-26 DIAGNOSIS — H538 Other visual disturbances: Secondary | ICD-10-CM | POA: Diagnosis not present

## 2023-11-26 DIAGNOSIS — R569 Unspecified convulsions: Secondary | ICD-10-CM | POA: Diagnosis present

## 2023-11-26 DIAGNOSIS — I10 Essential (primary) hypertension: Secondary | ICD-10-CM

## 2023-11-26 DIAGNOSIS — Z905 Acquired absence of kidney: Secondary | ICD-10-CM

## 2023-11-26 LAB — CBC
HCT: 32.6 % — ABNORMAL LOW (ref 39.0–52.0)
Hemoglobin: 10 g/dL — ABNORMAL LOW (ref 13.0–17.0)
MCH: 27.7 pg (ref 26.0–34.0)
MCHC: 30.7 g/dL (ref 30.0–36.0)
MCV: 90.3 fL (ref 80.0–100.0)
Platelets: 326 K/uL (ref 150–400)
RBC: 3.61 MIL/uL — ABNORMAL LOW (ref 4.22–5.81)
RDW: 17.2 % — ABNORMAL HIGH (ref 11.5–15.5)
WBC: 7 K/uL (ref 4.0–10.5)
nRBC: 0 % (ref 0.0–0.2)

## 2023-11-26 LAB — HIV ANTIBODY (ROUTINE TESTING W REFLEX): HIV Screen 4th Generation wRfx: NONREACTIVE

## 2023-11-26 LAB — COMPREHENSIVE METABOLIC PANEL WITH GFR
ALT: 11 U/L (ref 0–44)
AST: 13 U/L — ABNORMAL LOW (ref 15–41)
Albumin: 2.5 g/dL — ABNORMAL LOW (ref 3.5–5.0)
Alkaline Phosphatase: 73 U/L (ref 38–126)
Anion gap: 14 (ref 5–15)
BUN: 70 mg/dL — ABNORMAL HIGH (ref 6–20)
CO2: 20 mmol/L — ABNORMAL LOW (ref 22–32)
Calcium: 9.1 mg/dL (ref 8.9–10.3)
Chloride: 102 mmol/L (ref 98–111)
Creatinine, Ser: 23.2 mg/dL — ABNORMAL HIGH (ref 0.61–1.24)
GFR, Estimated: 2 mL/min — ABNORMAL LOW
Glucose, Bld: 90 mg/dL (ref 70–99)
Potassium: 4.2 mmol/L (ref 3.5–5.1)
Sodium: 136 mmol/L (ref 135–145)
Total Bilirubin: 0.5 mg/dL (ref 0.0–1.2)
Total Protein: 6.5 g/dL (ref 6.5–8.1)

## 2023-11-26 LAB — LIPASE, BLOOD: Lipase: 48 U/L (ref 11–51)

## 2023-11-26 MED ORDER — RENA-VITE PO TABS
1.0000 | ORAL_TABLET | Freq: Every day | ORAL | Status: DC
  Administered 2023-11-28 – 2023-11-29 (×2): 1 via ORAL
  Filled 2023-11-26 (×3): qty 1

## 2023-11-26 MED ORDER — IRBESARTAN 150 MG PO TABS
150.0000 mg | ORAL_TABLET | Freq: Every day | ORAL | Status: DC
  Administered 2023-11-26: 150 mg via ORAL
  Filled 2023-11-26: qty 1

## 2023-11-26 MED ORDER — ACETAMINOPHEN 650 MG RE SUPP: 650.0000 mg | Freq: Four times a day (QID) | RECTAL | Status: DC | PRN

## 2023-11-26 MED ORDER — CALCITRIOL 0.5 MCG PO CAPS
0.5000 ug | ORAL_CAPSULE | Freq: Every day | ORAL | Status: DC
  Administered 2023-11-26 – 2023-11-29 (×4): 0.5 ug via ORAL
  Filled 2023-11-26 (×4): qty 2

## 2023-11-26 MED ORDER — CALCIUM ACETATE (PHOS BINDER) 667 MG PO CAPS
1334.0000 mg | ORAL_CAPSULE | Freq: Three times a day (TID) | ORAL | Status: DC
  Administered 2023-11-26 – 2023-11-29 (×5): 1334 mg via ORAL
  Filled 2023-11-26 (×6): qty 2

## 2023-11-26 MED ORDER — SEVELAMER CARBONATE 800 MG PO TABS
3200.0000 mg | ORAL_TABLET | Freq: Three times a day (TID) | ORAL | Status: DC
  Administered 2023-11-26 – 2023-11-29 (×5): 3200 mg via ORAL
  Filled 2023-11-26 (×7): qty 4

## 2023-11-26 MED ORDER — SODIUM CHLORIDE 0.9% FLUSH
3.0000 mL | Freq: Two times a day (BID) | INTRAVENOUS | Status: DC
  Administered 2023-11-26 – 2023-11-29 (×6): 3 mL via INTRAVENOUS

## 2023-11-26 MED ORDER — GENTAMICIN SULFATE 0.1 % EX CREA
1.0000 | TOPICAL_CREAM | Freq: Every day | CUTANEOUS | Status: DC
  Filled 2023-11-26: qty 15

## 2023-11-26 MED ORDER — ACETAMINOPHEN 325 MG PO TABS
650.0000 mg | ORAL_TABLET | Freq: Four times a day (QID) | ORAL | Status: DC | PRN
  Administered 2023-11-28: 650 mg via ORAL
  Filled 2023-11-26 (×2): qty 2

## 2023-11-26 MED ORDER — DELFLEX-LC/4.25% DEXTROSE 483 MOSM/L IP SOLN: INTRAPERITONEAL | Status: DC

## 2023-11-26 MED ORDER — TENAPANOR HCL (CKD) 30 MG PO TABS: 30.0000 mg | ORAL_TABLET | Freq: Every day | ORAL | Status: DC

## 2023-11-26 MED ORDER — VANCOMYCIN HCL 1750 MG/350ML IV SOLN
1750.0000 mg | Freq: Once | INTRAVENOUS | Status: AC
  Administered 2023-11-26: 1750 mg via INTRAVENOUS
  Filled 2023-11-26: qty 350

## 2023-11-26 MED ORDER — ONDANSETRON HCL 4 MG PO TABS: 4.0000 mg | ORAL_TABLET | Freq: Four times a day (QID) | ORAL | Status: DC | PRN

## 2023-11-26 MED ORDER — GENTAMICIN SULFATE 0.1 % EX CREA
1.0000 | TOPICAL_CREAM | Freq: Every day | CUTANEOUS | Status: DC
  Administered 2023-11-28: 1 via TOPICAL
  Filled 2023-11-26: qty 15

## 2023-11-26 MED ORDER — ONDANSETRON HCL 4 MG/2ML IJ SOLN: 4.0000 mg | Freq: Four times a day (QID) | INTRAMUSCULAR | Status: DC | PRN

## 2023-11-26 MED ORDER — AMLODIPINE BESYLATE 10 MG PO TABS
10.0000 mg | ORAL_TABLET | Freq: Every day | ORAL | Status: DC
  Administered 2023-11-26 – 2023-11-29 (×4): 10 mg via ORAL
  Filled 2023-11-26 (×4): qty 1

## 2023-11-26 MED ORDER — SUCROFERRIC OXYHYDROXIDE 500 MG PO CHEW
1000.0000 mg | CHEWABLE_TABLET | Freq: Three times a day (TID) | ORAL | Status: DC
  Administered 2023-11-26 – 2023-11-29 (×5): 1000 mg via ORAL
  Filled 2023-11-26 (×12): qty 2

## 2023-11-26 MED ORDER — ALTEPLASE 2 MG IJ SOLR
INTRAMUSCULAR | Status: AC
  Filled 2023-11-26: qty 2

## 2023-11-26 MED ORDER — AMLODIPINE BESYLATE-VALSARTAN 10-160 MG PO TABS: 1.0000 | ORAL_TABLET | Freq: Every day | ORAL | Status: DC

## 2023-11-26 MED ORDER — DELFLEX-LC/2.5% DEXTROSE 394 MOSM/L IP SOLN: INTRAPERITONEAL | Status: DC

## 2023-11-26 MED ORDER — HEPARIN SODIUM (PORCINE) 5000 UNIT/ML IJ SOLN
5000.0000 [IU] | Freq: Three times a day (TID) | INTRAMUSCULAR | Status: DC
  Administered 2023-11-26 – 2023-11-29 (×7): 5000 [IU] via SUBCUTANEOUS
  Filled 2023-11-26 (×9): qty 1

## 2023-11-26 MED ORDER — CALCIUM ACETATE (PHOS BINDER) 667 MG PO CAPS: 667.0000 mg | ORAL_CAPSULE | Freq: Two times a day (BID) | ORAL | Status: DC | PRN

## 2023-11-26 NOTE — Consult Note (Addendum)
 Renal Service Consult Note [facility] Lamar JONETTA Fret, MD  Patient: Micheal Bean Date: 11/26/2023 Requesting Physician: Isaiah Lever, NP  Reason for Consult: ESRD pt w/ malfunctioning PD catheter HPI: The patient is a 38 y.o. year-old w/ PMH as below who presented to ED last night overnight reporting that his peritoneal dialysis catheter is not draining.  His time at work was on Wednesday.  He put in 2 L last night and only 120 cc drained out.  He was told to come to the emergency room.  Labs showed creatinine 23, BUN 70, potassium 4.2.  Vital signs were stable.  Hospitalist admitted the patient.  We are asked to see for dialysis.   Pt seen in room.  He denies any confusion, lethargy, jerking, nausea vomiting, fatigue.  Has been on PD for about 4 years.  He has not been constipated.  He was given lactulose a couple of days ago and that did not help.   ROS - denies CP, no joint pain, no HA, no blurry vision, no rash, no diarrhea, no nausea/ vomiting   Past Medical History  Past Medical History:  Diagnosis Date   Anemia    ESRD (end stage renal disease) (HCC)    PD at home   History of blood transfusion 11/2020   4 units   History of blood transfusion    [facility] (HCC)    [facility]   Past Surgical History  Past Surgical History:  Procedure Laterality Date   AV FISTULA PLACEMENT Right 03/02/2021   Procedure: RIGHT ARM Radio-Cephalic  ARTERIOVENOUS (AV) FISTULA CREATION;  Surgeon: Lanis Fonda BRAVO, MD;  Location: [facility] OR;  Service: Vascular;  Laterality: Right;   CAPD INSERTION N/A 05/20/2021   Procedure: LAPAROSCOPIC INSERTION CONTINUOUS AMBULATORY PERITONEAL DIALYSIS  CATHETER WITH POSSIBLE OMENTOPEXY;  Surgeon: Magda Debby SAILOR, MD;  Location: [facility] OR;  Service: Vascular;  Laterality: N/A;   IR FLUORO GUIDE CV LINE RIGHT  11/18/2020   IR US  GUIDE VASC ACCESS RIGHT  11/18/2020   NEPHRECTOMY     childhhod, RT side   PARATHYROIDECTOMY Left 05/31/2022    Procedure: TOTAL PARATHYROIDECTOMY WITH AUTOTRANSPLANT TO LEFT FOREARM;  Surgeon: Eletha Boas, MD;  Location: [facility] OR;  Service: General;  Laterality: Left;   QUADRICEPS TENDON REPAIR Right 10/29/2021   Procedure: RIGHT QUADRICEP REPAIR AND PRIMARY REPAIR ANTERIOR CAPSULE;  Surgeon: Doll Skates, MD;  Location: [facility] OR;  Service: Orthopedics;  Laterality: Right;   Family History  Family History  Problem Relation Age of Onset   Hypertension Mother    Kidney disease Maternal Grandmother    Social History  reports that he has never smoked. He has never used smokeless tobacco. He reports that he does not currently use alcohol. He reports that he does not currently use drugs. Allergies No Known Allergies Home medications Prior to Admission medications   Medication Sig Start Date End Date Taking? Authorizing Provider  amLODipine -valsartan  (EXFORGE ) 10-160 MG tablet Take 1 tablet by mouth at bedtime. 11/20/23  Yes [provider]  calcitRIOL  (ROCALTROL ) 0.5 MCG capsule Take 4 capsules (2 mcg total) by mouth 2 (two) times daily. Take inbetween meals Patient taking differently: Take 0.5 mcg by mouth daily. 06/11/22  Yes Ghimire, Donalda HERO, MD  Calcium  Acetate 667 MG TABS Take 667-1,334 mg by mouth See admin instructions. Take 2 tablets three times a day with each meal. Take 1 tablet with each snack.   Yes [provider]  Elderberry-Vitamin  C-Zinc (ELDERBERRY IMMUNE HEALTH GUMMY PO) Take 1 each by mouth daily.   Yes [provider]  gentamicin  cream (GARAMYCIN ) 0.1 % Apply 1 Application topically daily. 05/21/21  Yes [provider]  multivitamin (RENA-VIT) TABS tablet TAKE 1 TABLET BY MOUTH EVERYDAY AT BEDTIME Patient taking differently: Take 1 tablet by mouth daily with breakfast. 06/08/21  Yes Tanda Bleacher, MD  sevelamer  carbonate (RENVELA ) 800 MG tablet Take 3,200 mg by mouth 3 (three) times daily with meals. 08/03/23  Yes [provider]  Tenapanor  HCl,  CKD, (XPHOZAH ) 30 MG TABS Take 30 mg by mouth daily.   Yes [provider]  VELPHORO  500 MG chewable tablet Chew 1,000 mg by mouth 3 (three) times daily. 11/07/23  Yes [provider]     Vitals:   11/26/23 0054 11/26/23 0410 11/26/23 0411 11/26/23 0523  BP:  (!) 152/91  (!) 151/99  Pulse:  (!) 117    Resp:  (!) 25 18 20   Temp:    98.8 F (37.1 C)  TempSrc:    Oral  SpO2:  100%    Weight: 81.6 kg     Height: 6' 3 (1.905 m)      Exam Gen alert, no distress No rash, cyanosis or gangrene Sclera anicteric, throat clear  No jvd or bruits Chest clear bilat to bases, no rales/ wheezing RRR no MRG Abd soft ntnd no mass or ascites +bs GU deferred MS no joint effusions or deformity Ext no LE or UE edema, no other edema Neuro is alert, Ox 3 , nf    PD cath mid abdomen, intact  Home bp meds: Amlodipine -valsartan  10-160mg  one daily    OP PD: CCPD  6 exchanges  Fill vol 2800 ml  Dwell time 1 hr  1 daytime fill 2000 ml  EDW 73.5kg     Assessment/ Plan: Malfunctioning peritoneal dialysis catheter: Has not been working since Wednesday.  Not draining.  Tried lactulose without effect.  Denies history of constipation.  On peritoneal dialysis for 4 years.  VVS consulting.  Will try Cath-flo in the PD catheter. KUB shows PD cath in the lower abdomen. Creat up, wt's up but not having any resp issues or uremic findings. PD tonight if the Cath-flo works.  ESRD: usual PD is nightly. PD orders in, tentative, as above.  HTN: bp's slightly high, cont home meds Volume: is up 8kg by wts, follow.  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 Follow.        Micheal Fret  MD [facility] 11/26/2023, 8:56 AM  Recent Labs  Lab 11/26/23 0103  HGB 10.0*  ALBUMIN  2.5*  CALCIUM  9.1  CREATININE 23.20*  K 4.2   Inpatient medications:  amLODipine   10 mg Oral Daily   calcitRIOL   0.5 mcg Oral Daily   calcium  acetate  1,334 mg Oral TID with meals   heparin   5,000 Units Subcutaneous Q8H    irbesartan   150 mg Oral Daily   multivitamin  1 tablet Oral Q breakfast   sevelamer  carbonate  3,200 mg Oral TID WC   sodium chloride  flush  3 mL Intravenous Q12H   sucroferric oxyhydroxide  1,000 mg Oral TID with meals   Tenapanor  HCl (CKD)  30 mg Oral Daily    acetaminophen  **OR** acetaminophen , calcium  acetate, ondansetron  **OR** ondansetron  (ZOFRAN ) IV

## 2023-11-26 NOTE — ED Triage Notes (Signed)
 The pt is peritoneal dialysis and since Wednesday the catheter is not draining any fluid  once earlier today  drained after 2 liters was placed    his doctor told him to come here for treatment

## 2023-11-26 NOTE — Progress Notes (Signed)
 We were not able to get any saline to pass into the PD catheter, so we will not be able to do a Cath-flo dwell in the catheter. The RN noticed some pinprick holes in the catheter near the connection of the actual PD catheter and the [employer] extension. Will start IV vancomycin  as prophylaxis against peritonitis.   Myer Fret  MD  [facility] 11/26/2023, 5:30 PM

## 2023-11-26 NOTE — ED Provider Notes (Signed)
 [place] EMERGENCY DEPARTMENT AT [facility] Provider Note   CSN: [id number] Arrival date & time: 11/26/23  9957     Patient presents with: Vascular Access Problem   Micheal Bean is a 38 y.o. male.   The history is provided by the patient.  Illness Location:  Abdomen Quality:  PD catheter not inflowing or outflowing x 2 days Severity:  Moderate Duration:  2 days Timing:  Constant Progression:  Unchanged Chronicity:  New Associated symptoms: no fever and no vomiting        Prior to Admission medications   Medication Sig Start Date End Date Taking? Authorizing Provider  amLODipine  (NORVASC ) 10 MG tablet Take 1 tablet (10 mg total) by mouth daily. 05/14/23 06/13/23  Arlice Reichert, MD  calcitRIOL  (ROCALTROL ) 0.5 MCG capsule Take 4 capsules (2 mcg total) by mouth 2 (two) times daily. Take inbetween meals Patient taking differently: Take 0.5 mcg by mouth daily. 06/11/22   Raenelle Donalda HERO, MD  Calcium  Acetate 667 MG TABS Take 667-1,334 mg by mouth See admin instructions. Take 2 tablets three times a day with each meal. Take 1 tablet with each snack.    [provider]  gentamicin  cream (GARAMYCIN ) 0.1 % Apply 1 Application topically at bedtime as needed (infection prevention). 05/21/21   [provider]  multivitamin (RENA-VIT) TABS tablet TAKE 1 TABLET BY MOUTH EVERYDAY AT BEDTIME Patient taking differently: Take 1 tablet by mouth daily with breakfast. 06/08/21   Tanda Bleacher, MD  Tenapanor  HCl, CKD, (XPHOZAH ) 30 MG TABS Take 30 mg by mouth in the morning and at bedtime.    [provider]    Allergies: Patient has no known allergies.    Review of Systems  Constitutional:  Negative for fever.  Gastrointestinal:  Negative for constipation and vomiting.  Skin:  Negative for wound.  All other systems reviewed and are negative.   Updated Vital Signs BP (!) 164/106 (BP Location: Right Arm)   Pulse (!) 121   Temp 98.5 F (36.9 C)   Resp  18   Ht 6' 3 (1.905 m)   Wt 81.6 kg   SpO2 100%   BMI 22.49 kg/m   Physical Exam Constitutional:      General: He is not in acute distress.    Appearance: He is well-developed. He is not diaphoretic.  HENT:     Head: Normocephalic and atraumatic.     Nose: Nose normal.  Eyes:     Conjunctiva/sclera: Conjunctivae normal.     Pupils: Pupils are equal, round, and reactive to light.  Cardiovascular:     Rate and Rhythm: Normal rate and regular rhythm.     Pulses: Normal pulses.     Heart sounds: Normal heart sounds.  Pulmonary:     Effort: Pulmonary effort is normal.     Breath sounds: Normal breath sounds. No wheezing or rales.  Abdominal:     General: Bowel sounds are normal.     Palpations: Abdomen is soft.     Tenderness: There is no abdominal tenderness. There is no guarding or rebound.  Musculoskeletal:        General: Normal range of motion.     Cervical back: Normal range of motion and neck supple.  Skin:    General: Skin is warm and dry.     Capillary Refill: Capillary refill takes less than 2 seconds.  Neurological:     General: No focal deficit present.     Mental Status: He  is alert and oriented to person, place, and time.     (all labs ordered are listed, but only abnormal results are displayed) Results for orders placed or performed during the hospital encounter of 11/26/23  Lipase, blood   Collection Time: 11/26/23  1:03 AM  Result Value Ref Range   Lipase 48 11 - 51 U/L  Comprehensive metabolic panel   Collection Time: 11/26/23  1:03 AM  Result Value Ref Range   Sodium 136 135 - 145 mmol/L   Potassium 4.2 3.5 - 5.1 mmol/L   Chloride 102 98 - 111 mmol/L   CO2 20 (L) 22 - 32 mmol/L   Glucose, Bld 90 70 - 99 mg/dL   BUN 70 (H) 6 - 20 mg/dL   Creatinine, Ser 76.79 (H) 0.61 - 1.24 mg/dL   Calcium  9.1 8.9 - 10.3 mg/dL   Total Protein 6.5 6.5 - 8.1 g/dL   Albumin  2.5 (L) 3.5 - 5.0 g/dL   AST 13 (L) 15 - 41 U/L   ALT 11 0 - 44 U/L   Alkaline  Phosphatase 73 38 - 126 U/L   Total Bilirubin 0.5 0.0 - 1.2 mg/dL   GFR, Estimated 2 (L) >60 mL/min   Anion gap 14 5 - 15  CBC   Collection Time: 11/26/23  1:03 AM  Result Value Ref Range   WBC 7.0 4.0 - 10.5 K/uL   RBC 3.61 (L) 4.22 - 5.81 MIL/uL   Hemoglobin 10.0 (L) 13.0 - 17.0 g/dL   HCT 67.3 (L) 60.9 - 47.9 %   MCV 90.3 80.0 - 100.0 fL   MCH 27.7 26.0 - 34.0 pg   MCHC 30.7 30.0 - 36.0 g/dL   RDW 82.7 (H) 88.4 - 84.4 %   Platelets 326 150 - 400 K/uL   nRBC 0.0 0.0 - 0.2 %   DG Chest Portable 1 View Result Date: 11/26/2023 CLINICAL DATA:  Nondraining peritoneal dialysis catheter EXAM: PORTABLE CHEST 1 VIEW COMPARISON:  05/12/2023 FINDINGS: Cardiac shadow is within normal limits. Lungs are clear bilaterally. Free intraperitoneal air is noted beneath the hemidiaphragms bilaterally likely related to the known peritoneal dialysis catheter. No bony abnormality is noted. IMPRESSION: No acute abnormality in the chest. Free air underneath hemidiaphragms likely related to the known peritoneal dialysis catheter. Electronically Signed   By: Oneil Devonshire M.D.   On: 11/26/2023 03:08     EKG: None  Radiology: DG Chest Portable 1 View Result Date: 11/26/2023 CLINICAL DATA:  Nondraining peritoneal dialysis catheter EXAM: PORTABLE CHEST 1 VIEW COMPARISON:  05/12/2023 FINDINGS: Cardiac shadow is within normal limits. Lungs are clear bilaterally. Free intraperitoneal air is noted beneath the hemidiaphragms bilaterally likely related to the known peritoneal dialysis catheter. No bony abnormality is noted. IMPRESSION: No acute abnormality in the chest. Free air underneath hemidiaphragms likely related to the known peritoneal dialysis catheter. Electronically Signed   By: Oneil Devonshire M.D.   On: 11/26/2023 03:08     Procedures   Medications Ordered in the ED - No data to display                                  Medical Decision Making Amount and/or Complexity of Data Reviewed Labs: ordered.     Details: Normal white count 7, low hemoglobin 10, normal platelets.  Normal sodium 136, normal potassium 4.2, elevated creatinine  Radiology: ordered. Discussion of management or test interpretation with external provider(s): Case  d/w Dr. Macel, admit to medicine.  Nephrology will consult vascular in am   Risk Decision regarding hospitalization.    Final diagnoses:  ESRD (end stage renal disease) (HCC)   The patient appears reasonably stabilized for admission considering the current resources, flow, and capabilities available in the ED at this time, and I doubt any other [facility] requiring further screening and/or treatment in the ED prior to admission.  ED Discharge Orders     None          Armanii Pressnell, MD 11/26/23 [phone]

## 2023-11-26 NOTE — Consult Note (Signed)
 [facility] [place]  Patient name: Micheal Bean MRN: [id number] DOB: 07-25-1985 Sex: male   REQUESTING PROVIDER:   Hospitalists   REASON FOR CONSULT:    Nonfunctioning peritoneal catheter  HISTORY OF PRESENT ILLNESS:   Micheal Bean is a 38 y.o. male, who is status post peritoneal dialysis catheter several years ago by Dr. Magda.  This stopped draining on Wednesday.  They placed 2800 cc and on Wednesday and only had 123 returned.  He is not having any abdominal pain.  He reports no prior malfunctions of his catheter.  He was told to come to the emergency department for further evaluation.  He has been admitted to the hospital service.  PAST MEDICAL HISTORY    Past Medical History:  Diagnosis Date   Anemia    ESRD (end stage renal disease) (HCC)    PD at home   History of blood transfusion 11/2020   4 units   History of blood transfusion    Seizure (HCC)    [facility]     FAMILY HISTORY   Family History  Problem Relation Age of Onset   Hypertension Mother    Kidney disease Maternal Grandmother     SOCIAL HISTORY:   Social History   Socioeconomic History   Marital status: Single    Spouse name: Not on file   Number of children: 2   Years of education: Not on file   Highest education level: Not on file  Occupational History   Not on file  Tobacco Use   Smoking status: Never   Smokeless tobacco: Never  Vaping Use   Vaping status: Never Used  Substance and Sexual Activity   Alcohol use: Not Currently   Drug use: Not Currently   Sexual activity: Not Currently  Other Topics Concern   Not on file  Social History Narrative   Not on file   Social Drivers of Health   Financial Resource Strain: Not on file  Food Insecurity: No Food Insecurity (11/26/2023)   Hunger Vital Sign    Worried About Running Out of Food in the Last Year: Never true    Ran Out of Food in the Last Year:  Never true  Transportation Needs: No Transportation Needs (11/26/2023)   PRAPARE - Administrator, Civil Service (Medical): No    Lack of Transportation (Non-Medical): No  Physical Activity: Not on file  Stress: Not on file  Social Connections: Not on file  Intimate Partner Violence: Not At Risk (11/26/2023)   Humiliation, Afraid, Rape, and Kick questionnaire    Fear of Current or Ex-Partner: No    Emotionally Abused: No    Physically Abused: No    Sexually Abused: No    ALLERGIES:    No Known Allergies  CURRENT MEDICATIONS:    Current Facility-Administered Medications  Medication Dose Route Frequency Provider Last Rate Last Admin   acetaminophen  (TYLENOL ) tablet 650 mg  650 mg Oral Q6H PRN [place] Isaiah LITTIE, NP       Or   acetaminophen  (TYLENOL ) suppository 650 mg  650 mg Rectal Q6H PRN [place] Isaiah LITTIE, NP       amLODipine  (NORVASC ) tablet 10 mg  10 mg Oral Daily [place] Isaiah LITTIE, NP   10 mg at 11/26/23 9088   calcitRIOL  (ROCALTROL ) capsule 0.5 mcg  0.5 mcg Oral Daily [place] Isaiah LITTIE, NP   0.5 mcg at 11/26/23 0911   calcium  acetate (PHOSLO )  capsule 1,334 mg  1,334 mg Oral TID with meals [place] Isaiah CROME, NP       calcium  acetate (PHOSLO ) capsule 667 mg  667 mg Oral BID BM PRN [place] Isaiah CROME, NP       heparin  injection 5,000 Units  5,000 Units Subcutaneous Q8H [place] Isaiah CROME, NP   5,000 Units at 11/26/23 9088   irbesartan  (AVAPRO ) tablet 150 mg  150 mg Oral Daily [place] Isaiah CROME, NP   150 mg at 11/26/23 9088   multivitamin (RENA-VIT) tablet 1 tablet  1 tablet Oral Q breakfast Alto Isaiah CROME, NP       ondansetron  (ZOFRAN ) tablet 4 mg  4 mg Oral Q6H PRN [place] Isaiah CROME, NP       Or   ondansetron  (ZOFRAN ) injection 4 mg  4 mg Intravenous Q6H PRN [place] Isaiah CROME, NP       sevelamer  carbonate (RENVELA ) tablet 3,200 mg  3,200 mg Oral TID WC Alto Isaiah CROME, NP       sodium chloride  flush (NS) 0.9 % injection 3 mL  3 mL Intravenous Q12H Alto Isaiah CROME, NP        sucroferric oxyhydroxide (VELPHORO ) chewable tablet 1,000 mg  1,000 mg Oral TID with meals Alto Isaiah CROME, NP       Tenapanor  HCl (CKD) TABS 30 mg  30 mg Oral Daily [place] Isaiah CROME, NP        REVIEW OF SYSTEMS:   [X]  denotes positive finding, [ ]  denotes negative finding Cardiac  Comments:  Chest pain or chest pressure:    Shortness of breath upon exertion:    Short of breath when lying flat:    Irregular heart rhythm:        Vascular    Pain in calf, thigh, or hip brought on by ambulation:    Pain in feet at night that wakes you up from your sleep:     Blood clot in your veins:    Leg swelling:         Pulmonary    Oxygen at home:    Productive cough:     Wheezing:         Neurologic    Sudden weakness in arms or legs:     Sudden numbness in arms or legs:     Sudden onset of difficulty speaking or slurred speech:    Temporary loss of vision in one eye:     Problems with dizziness:         Gastrointestinal    Blood in stool:      Vomited blood:         Genitourinary    Burning when urinating:     Blood in urine:        Psychiatric    Major depression:         Hematologic    Bleeding problems:    Problems with blood clotting too easily:        Skin    Rashes or ulcers:        Constitutional    Fever or chills:     PHYSICAL EXAM:   Vitals:   11/26/23 0411 11/26/23 0523 11/26/23 0908 11/26/23 0910  BP:  (!) 151/99  (!) 148/88  Pulse:    (!) 102  Resp: 18 20  20   Temp:  98.8 F (37.1 C) 98.8 F (37.1 C) 98.5 F (36.9 C)  TempSrc:  Oral Oral Oral  SpO2:  Weight:      Height:        GENERAL: The patient is a well-nourished male, in no acute distress. The vital signs are documented above. CARDIAC: There is a regular rate and rhythm.  PULMONARY: Nonlabored respirations ABDOMEN: Soft and non-tender  MUSCULOSKELETAL: There are no major deformities or cyanosis. NEUROLOGIC: No focal weakness or paresthesias are detected. SKIN: There are no ulcers  or rashes noted. PSYCHIATRIC: The patient has a normal affect.  STUDIES:   None  ASSESSMENT and PLAN   ESRD: The patient's peritoneal catheter is not functioning.  We will try Cathflo today to see if we can get this to start draining again.  If not, we will need plan for exchange of catheter versus catheter removal and initiation of hemodialysis.  I will follow-up tomorrow.  I will make him n.p.o. after midnight in case we need to put a new catheter in over the weekend.   Malvina Serene CLORE, MD, FACS [facility] [phone] Pager [phone]

## 2023-11-26 NOTE — Progress Notes (Signed)
 Pharmacy Antibiotic Note  Micheal Bean is a 38 y.o. male admitted on 11/26/2023 with PD catheter malfunction.  Pharmacy has been consulted for Vancomycin  dosing.  Plan: Give a loading dose of 1750 mg (20 mg/kg) If continuing, check a vancomycin  level about 48-72 hours post-dose and as needed  Re-dose with 15 mg/kg when levels reach <20 mcg/dL  Continue checking levels intermittently ([id number]) and repeating doses to achieve adequate goal concentrations   Height: 6' 3 (190.5 cm) Weight: 81.6 kg (179 lb 14.3 oz) IBW/kg (Calculated) : 84.5  Temp (24hrs), Avg:98.7 F (37.1 C), Min:98.5 F (36.9 C), Max:98.8 F (37.1 C)  Recent Labs  Lab 11/26/23 0103  WBC 7.0  CREATININE 23.20*    Estimated Creatinine Clearance: 5.1 mL/min (A) (by C-G formula based on SCr of 23.2 mg/dL (H)).    No Known Allergies  Antimicrobials this admission: Vancomycin  8-23 >>   Thank you for allowing pharmacy to be a part of this patient's care.  Larraine CHRISTELLA Brazier 11/26/2023 6:02 PM

## 2023-11-26 NOTE — H&P (View-Only) (Signed)
 [facility] [place]  Patient name: Micheal Bean MRN: [id number] DOB: 07-25-1985 Sex: male   REQUESTING PROVIDER:   Hospitalists   REASON FOR CONSULT:    Nonfunctioning peritoneal catheter  HISTORY OF PRESENT ILLNESS:   Micheal Bean is a 38 y.o. male, who is status post peritoneal dialysis catheter several years ago by Dr. Magda.  This stopped draining on Wednesday.  They placed 2800 cc and on Wednesday and only had 123 returned.  He is not having any abdominal pain.  He reports no prior malfunctions of his catheter.  He was told to come to the emergency department for further evaluation.  He has been admitted to the hospital service.  PAST MEDICAL HISTORY    Past Medical History:  Diagnosis Date   Anemia    ESRD (end stage renal disease) (HCC)    PD at home   History of blood transfusion 11/2020   4 units   History of blood transfusion    Seizure (HCC)    [facility]     FAMILY HISTORY   Family History  Problem Relation Age of Onset   Hypertension Mother    Kidney disease Maternal Grandmother     SOCIAL HISTORY:   Social History   Socioeconomic History   Marital status: Single    Spouse name: Not on file   Number of children: 2   Years of education: Not on file   Highest education level: Not on file  Occupational History   Not on file  Tobacco Use   Smoking status: Never   Smokeless tobacco: Never  Vaping Use   Vaping status: Never Used  Substance and Sexual Activity   Alcohol use: Not Currently   Drug use: Not Currently   Sexual activity: Not Currently  Other Topics Concern   Not on file  Social History Narrative   Not on file   Social Drivers of Health   Financial Resource Strain: Not on file  Food Insecurity: No Food Insecurity (11/26/2023)   Hunger Vital Sign    Worried About Running Out of Food in the Last Year: Never true    Ran Out of Food in the Last Year:  Never true  Transportation Needs: No Transportation Needs (11/26/2023)   PRAPARE - Administrator, Civil Service (Medical): No    Lack of Transportation (Non-Medical): No  Physical Activity: Not on file  Stress: Not on file  Social Connections: Not on file  Intimate Partner Violence: Not At Risk (11/26/2023)   Humiliation, Afraid, Rape, and Kick questionnaire    Fear of Current or Ex-Partner: No    Emotionally Abused: No    Physically Abused: No    Sexually Abused: No    ALLERGIES:    No Known Allergies  CURRENT MEDICATIONS:    Current Facility-Administered Medications  Medication Dose Route Frequency Provider Last Rate Last Admin   acetaminophen  (TYLENOL ) tablet 650 mg  650 mg Oral Q6H PRN Alto Isaiah LITTIE, NP       Or   acetaminophen  (TYLENOL ) suppository 650 mg  650 mg Rectal Q6H PRN Alto Isaiah LITTIE, NP       amLODipine  (NORVASC ) tablet 10 mg  10 mg Oral Daily Alto Isaiah LITTIE, NP   10 mg at 11/26/23 9088   calcitRIOL  (ROCALTROL ) capsule 0.5 mcg  0.5 mcg Oral Daily Alto Isaiah LITTIE, NP   0.5 mcg at 11/26/23 0911   calcium  acetate (PHOSLO )  capsule 1,334 mg  1,334 mg Oral TID with meals Alto Isaiah CROME, NP       calcium  acetate (PHOSLO ) capsule 667 mg  667 mg Oral BID BM PRN Alto Isaiah CROME, NP       heparin  injection 5,000 Units  5,000 Units Subcutaneous Q8H Alto Isaiah CROME, NP   5,000 Units at 11/26/23 9088   irbesartan  (AVAPRO ) tablet 150 mg  150 mg Oral Daily Alto Isaiah CROME, NP   150 mg at 11/26/23 9088   multivitamin (RENA-VIT) tablet 1 tablet  1 tablet Oral Q breakfast Alto Isaiah CROME, NP       ondansetron  (ZOFRAN ) tablet 4 mg  4 mg Oral Q6H PRN Alto Isaiah CROME, NP       Or   ondansetron  (ZOFRAN ) injection 4 mg  4 mg Intravenous Q6H PRN Alto Isaiah CROME, NP       sevelamer  carbonate (RENVELA ) tablet 3,200 mg  3,200 mg Oral TID WC Alto Isaiah CROME, NP       sodium chloride  flush (NS) 0.9 % injection 3 mL  3 mL Intravenous Q12H Alto Isaiah CROME, NP        sucroferric oxyhydroxide (VELPHORO ) chewable tablet 1,000 mg  1,000 mg Oral TID with meals Alto Isaiah CROME, NP       Tenapanor  HCl (CKD) TABS 30 mg  30 mg Oral Daily Alto Isaiah CROME, NP        REVIEW OF SYSTEMS:   [X]  denotes positive finding, [ ]  denotes negative finding Cardiac  Comments:  Chest pain or chest pressure:    Shortness of breath upon exertion:    Short of breath when lying flat:    Irregular heart rhythm:        Vascular    Pain in calf, thigh, or hip brought on by ambulation:    Pain in feet at night that wakes you up from your sleep:     Blood clot in your veins:    Leg swelling:         Pulmonary    Oxygen at home:    Productive cough:     Wheezing:         Neurologic    Sudden weakness in arms or legs:     Sudden numbness in arms or legs:     Sudden onset of difficulty speaking or slurred speech:    Temporary loss of vision in one eye:     Problems with dizziness:         Gastrointestinal    Blood in stool:      Vomited blood:         Genitourinary    Burning when urinating:     Blood in urine:        Psychiatric    Major depression:         Hematologic    Bleeding problems:    Problems with blood clotting too easily:        Skin    Rashes or ulcers:        Constitutional    Fever or chills:     PHYSICAL EXAM:   Vitals:   11/26/23 0411 11/26/23 0523 11/26/23 0908 11/26/23 0910  BP:  (!) 151/99  (!) 148/88  Pulse:    (!) 102  Resp: 18 20  20   Temp:  98.8 F (37.1 C) 98.8 F (37.1 C) 98.5 F (36.9 C)  TempSrc:  Oral Oral Oral  SpO2:  Weight:      Height:        GENERAL: The patient is a well-nourished male, in no acute distress. The vital signs are documented above. CARDIAC: There is a regular rate and rhythm.  PULMONARY: Nonlabored respirations ABDOMEN: Soft and non-tender  MUSCULOSKELETAL: There are no major deformities or cyanosis. NEUROLOGIC: No focal weakness or paresthesias are detected. SKIN: There are no ulcers  or rashes noted. PSYCHIATRIC: The patient has a normal affect.  STUDIES:   None  ASSESSMENT and PLAN   ESRD: The patient's peritoneal catheter is not functioning.  We will try Cathflo today to see if we can get this to start draining again.  If not, we will need plan for exchange of catheter versus catheter removal and initiation of hemodialysis.  I will follow-up tomorrow.  I will make him n.p.o. after midnight in case we need to put a new catheter in over the weekend.   Malvina Serene CLORE, MD, FACS [facility] [phone] Pager [phone]

## 2023-11-26 NOTE — H&P (Addendum)
 History and Physical    Patient: Micheal Bean DOB: 1985/10/13 DOA: 11/26/2023 DOS: the patient was seen and examined on 11/26/2023 PCP: Tanda Bleacher, MD  Patient coming from: Home  Chief Complaint:  Chief Complaint  Patient presents with   Vascular Access Problem   HPI: Micheal Bean is a 38 y.o. male with medical history significant of chronic kidney disease stage V on peritoneal dialysis x 4 years, anemia and chronic kidney disease, and secondary hyperparathyroidism secondary to renal disease.  Patient reports had been doing well with his dialysis treatments until Wednesday night when he placed 2800 cc in but was only able to have 123 cc return and his machine continue to alarm malfunction.  He feels full but has not had any abdominal pain, chills or fevers.  No drainage from the access.  Patient notified his primary nephrologist and patient was instructed to come to the the ER for admission.  Current potassium is stable at 4.2, BUN 70 and creatinine 23.2.  Mentation is normal.  Vital signs are stable and he is afebrile.  Respiratory status is stable and he is maintaining O2 sats greater than 97% on room air.  Hospital service has been asked to evaluate the patient for admission.  He will require dialysis access change out this admission.  Review of Systems: As mentioned in the history of present illness. All other systems reviewed and are negative.  Past Medical History:  Diagnosis Date   Anemia    ESRD (end stage renal disease) (HCC)    PD at home   History of blood transfusion 11/2020   4 units   History of blood transfusion    Seizure (HCC)    [facility]   Past Surgical History:  Procedure Laterality Date   AV FISTULA PLACEMENT Right 03/02/2021   Procedure: RIGHT ARM Radio-Cephalic  ARTERIOVENOUS (AV) FISTULA CREATION;  Surgeon: Lanis Fonda BRAVO, MD;  Location: [facility] OR;  Service: Vascular;  Laterality: Right;   CAPD INSERTION N/A 05/20/2021    Procedure: LAPAROSCOPIC INSERTION CONTINUOUS AMBULATORY PERITONEAL DIALYSIS  CATHETER WITH POSSIBLE OMENTOPEXY;  Surgeon: Magda Debby SAILOR, MD;  Location: [facility] OR;  Service: Vascular;  Laterality: N/A;   IR FLUORO GUIDE CV LINE RIGHT  11/18/2020   IR US  GUIDE VASC ACCESS RIGHT  11/18/2020   NEPHRECTOMY     childhhod, RT side   PARATHYROIDECTOMY Left 05/31/2022   Procedure: TOTAL PARATHYROIDECTOMY WITH AUTOTRANSPLANT TO LEFT FOREARM;  Surgeon: Eletha Boas, MD;  Location: [facility] OR;  Service: General;  Laterality: Left;   QUADRICEPS TENDON REPAIR Right 10/29/2021   Procedure: RIGHT QUADRICEP REPAIR AND PRIMARY REPAIR ANTERIOR CAPSULE;  Surgeon: Doll Skates, MD;  Location: [facility] OR;  Service: Orthopedics;  Laterality: Right;   Social History:  reports that he has never smoked. He has never used smokeless tobacco. He reports that he does not currently use alcohol. He reports that he does not currently use drugs.  No Known Allergies  Family History  Problem Relation Age of Onset   Hypertension Mother    Kidney disease Maternal Grandmother     Prior to Admission medications   Medication Sig Start Date End Date Taking? Authorizing Provider  amLODipine -valsartan  (EXFORGE ) 10-160 MG tablet Take 1 tablet by mouth at bedtime. 11/20/23  Yes [provider]  calcitRIOL  (ROCALTROL ) 0.5 MCG capsule Take 4 capsules (2 mcg total) by mouth 2 (two) times daily. Take inbetween meals Patient taking differently: Take 0.5 mcg by mouth daily.  06/11/22  Yes GhimireDonalda HERO, MD  Calcium  Acetate 667 MG TABS Take 667-1,334 mg by mouth See admin instructions. Take 2 tablets three times a day with each meal. Take 1 tablet with each snack.   Yes [provider]  Elderberry-Vitamin C-Zinc (ELDERBERRY IMMUNE HEALTH GUMMY PO) Take 1 each by mouth daily.   Yes [provider]  gentamicin  cream (GARAMYCIN ) 0.1 % Apply 1 Application topically daily. 05/21/21  Yes [provider]  multivitamin  (RENA-VIT) TABS tablet TAKE 1 TABLET BY MOUTH EVERYDAY AT BEDTIME Patient taking differently: Take 1 tablet by mouth daily with breakfast. 06/08/21  Yes Tanda Bleacher, MD  sevelamer  carbonate (RENVELA ) 800 MG tablet Take 3,200 mg by mouth 3 (three) times daily with meals. 08/03/23  Yes [provider]  Tenapanor  HCl, CKD, (XPHOZAH ) 30 MG TABS Take 30 mg by mouth daily.   Yes [provider]  VELPHORO  500 MG chewable tablet Chew 1,000 mg by mouth 3 (three) times daily. 11/07/23  Yes [provider]    Physical Exam: Vitals:   11/26/23 0054 11/26/23 0410 11/26/23 0411 11/26/23 0523  BP:  (!) 152/91  (!) 151/99  Pulse:  (!) 117    Resp:  (!) 25 18 20   Temp:    98.8 F (37.1 C)  TempSrc:    Oral  SpO2:  100%    Weight: 81.6 kg     Height: 6' 3 (1.905 m)      Constitutional: NAD, calm, comfortable Respiratory: clear to auscultation bilaterally, no wheezing, no crackles. Normal respiratory effort. No accessory muscle use.  Cardiovascular: Regular rate and rhythm, no murmurs / rubs / gallops. No extremity edema. 2+ pedal pulses.   Abdomen: no tenderness, somewhat distended but not tight.  No masses palpated. No hepatosplenomegaly. Bowel sounds positive.  Musculoskeletal: no clubbing / cyanosis. No joint deformity upper and lower extremities. Good ROM, no contractures. Normal muscle tone.  Skin: no rashes, lesions, ulcers. No induration Neurologic: CN 2-12 grossly intact. Sensation intact,  Strength 5/5 x all 4 extremities.  Psychiatric: Normal judgment and insight. Alert and oriented x 3. Normal mood.     Data Reviewed:  As per HPI  Assessment and Plan: Malfunctioning peritoneal dialysis catheter access Chronic kidney disease stage V Presented for admission secondary to malfunctioning PD catheter  Nephrology has been consulted I spoke with [facility]/Dr. Serene.  Unfortunately it appears that his team was not notified yesterday therefore patient's procedure may need  to be delayed until Monday unless an OR time becomes available.  Will reverse n.p.o. status and allow patient to eat Current labs are stable and clinical condition is stable in regards to respiratory status and no reports of abdominal pain. Of note patient has recently transferred from [facility] transplant service to [facility] transplant and workup is in process  Hypertension Continue Norvasc  and Avapro   Anemia of chronic kidney disease Hemoglobin stable at 10 Continue Nephro-Vite   Secondary hyperparathyroidism Continue Rocaltrol , PhosLo , Tenapanor  and Velphoro     Advance Care Planning:   Code Status: Full Code   VTE prophylaxis: Subcutaneous heparin   Consults: Nephrology, VVS  Family Communication: Mother at bedside  Severity of Illness: The appropriate patient status for this patient is OBSERVATION. Observation status is judged to be reasonable and necessary in order to provide the required intensity of service to ensure the patient's safety. The patient's presenting symptoms, physical exam findings, and initial radiographic and laboratory data in the context of their medical condition is felt to place them  at decreased risk for further clinical deterioration. Furthermore, it is anticipated that the patient will be medically stable for discharge from the hospital within 2 midnights of admission.  Of note given unexpected delay in timing of PD catheter exchange we will likely need to change to inpatient status.  This will need to be discussed with UR nurse  Author: Isaiah Lever, NP 11/26/2023 7:01 AM  For on call review [email or web address].

## 2023-11-26 NOTE — Procedures (Addendum)
 PD note:  Attempted to dwell Cath Flow into patients PD catheter. Initial attempt with to push plain normal saline to assess the ability to utilize the catheter proved ineffective. No NS was able to be instilled into the catheter.. While pushing the NS, pin prick holes in the distal end of the PD catheter, just at the area closest to the Fresenius extension, became apparent. Patient made aware.  Dr Geralynn made aware.  Patient had no complaint of discomfort or nausea.  Patient stated that he had a normal bowel movement yesterday.

## 2023-11-27 ENCOUNTER — Other Ambulatory Visit: Payer: Self-pay

## 2023-11-27 ENCOUNTER — Observation Stay (HOSPITAL_COMMUNITY): Admitting: Anesthesiology

## 2023-11-27 ENCOUNTER — Encounter (HOSPITAL_COMMUNITY)
Admission: EM | Disposition: A | Discharge status: Home / Self Care | Payer: Self-pay | Source: Home / Self Care | Attending: Internal Medicine

## 2023-11-27 DIAGNOSIS — I10 Essential (primary) hypertension: Secondary | ICD-10-CM

## 2023-11-27 DIAGNOSIS — Z905 Acquired absence of kidney: Secondary | ICD-10-CM | POA: Diagnosis not present

## 2023-11-27 DIAGNOSIS — I120 Hypertensive chronic kidney disease with stage 5 chronic kidney disease or end stage renal disease: Secondary | ICD-10-CM

## 2023-11-27 DIAGNOSIS — T85611A Breakdown (mechanical) of intraperitoneal dialysis catheter, initial encounter: Secondary | ICD-10-CM | POA: Diagnosis present

## 2023-11-27 DIAGNOSIS — N186 End stage renal disease: Secondary | ICD-10-CM

## 2023-11-27 DIAGNOSIS — Z8249 Family history of ischemic heart disease and other diseases of the circulatory system: Secondary | ICD-10-CM | POA: Diagnosis not present

## 2023-11-27 DIAGNOSIS — T85691A Other mechanical complication of intraperitoneal dialysis catheter, initial encounter: Secondary | ICD-10-CM | POA: Diagnosis not present

## 2023-11-27 DIAGNOSIS — T8241XA Breakdown (mechanical) of vascular dialysis catheter, initial encounter: Secondary | ICD-10-CM | POA: Diagnosis not present

## 2023-11-27 DIAGNOSIS — Z992 Dependence on renal dialysis: Secondary | ICD-10-CM | POA: Diagnosis not present

## 2023-11-27 DIAGNOSIS — R569 Unspecified convulsions: Secondary | ICD-10-CM | POA: Diagnosis present

## 2023-11-27 DIAGNOSIS — B957 Other staphylococcus as the cause of diseases classified elsewhere: Secondary | ICD-10-CM | POA: Diagnosis present

## 2023-11-27 DIAGNOSIS — D631 Anemia in chronic kidney disease: Secondary | ICD-10-CM | POA: Diagnosis present

## 2023-11-27 DIAGNOSIS — Z79899 Other long term (current) drug therapy: Secondary | ICD-10-CM | POA: Diagnosis not present

## 2023-11-27 DIAGNOSIS — N19 Unspecified kidney failure: Secondary | ICD-10-CM | POA: Diagnosis present

## 2023-11-27 DIAGNOSIS — E871 Hypo-osmolality and hyponatremia: Secondary | ICD-10-CM | POA: Diagnosis present

## 2023-11-27 DIAGNOSIS — H538 Other visual disturbances: Secondary | ICD-10-CM | POA: Diagnosis not present

## 2023-11-27 DIAGNOSIS — Z841 Family history of disorders of kidney and ureter: Secondary | ICD-10-CM | POA: Diagnosis not present

## 2023-11-27 DIAGNOSIS — Y828 Other medical devices associated with adverse incidents: Secondary | ICD-10-CM | POA: Diagnosis present

## 2023-11-27 DIAGNOSIS — Z9889 Other specified postprocedural states: Secondary | ICD-10-CM | POA: Diagnosis not present

## 2023-11-27 DIAGNOSIS — K0889 Other specified disorders of teeth and supporting structures: Secondary | ICD-10-CM | POA: Diagnosis present

## 2023-11-27 DIAGNOSIS — R109 Unspecified abdominal pain: Secondary | ICD-10-CM | POA: Diagnosis not present

## 2023-11-27 DIAGNOSIS — N2581 Secondary hyperparathyroidism of renal origin: Secondary | ICD-10-CM | POA: Diagnosis present

## 2023-11-27 DIAGNOSIS — T8571XA Infection and inflammatory reaction due to peritoneal dialysis catheter, initial encounter: Secondary | ICD-10-CM | POA: Diagnosis present

## 2023-11-27 DIAGNOSIS — Z9089 Acquired absence of other organs: Secondary | ICD-10-CM | POA: Diagnosis not present

## 2023-11-27 HISTORY — PX: CAPD INSERTION: SHX5233

## 2023-11-27 LAB — RENAL FUNCTION PANEL
Albumin: 1.9 g/dL — ABNORMAL LOW (ref 3.5–5.0)
Anion gap: 14 (ref 5–15)
BUN: 80 mg/dL — ABNORMAL HIGH (ref 6–20)
CO2: 19 mmol/L — ABNORMAL LOW (ref 22–32)
Calcium: 8.5 mg/dL — ABNORMAL LOW (ref 8.9–10.3)
Chloride: 102 mmol/L (ref 98–111)
Creatinine, Ser: 24.52 mg/dL — ABNORMAL HIGH (ref 0.61–1.24)
GFR, Estimated: 2 mL/min — ABNORMAL LOW (ref >60)
Glucose, Bld: 76 mg/dL (ref 70–99)
Phosphorus: 4 mg/dL (ref 2.5–4.6)
Potassium: 4.5 mmol/L (ref 3.5–5.1)
Sodium: 135 mmol/L (ref 135–145)

## 2023-11-27 SURGERY — LAPAROSCOPIC INSERTION CONTINUOUS AMBULATORY PERITONEAL DIALYSIS  (CAPD) CATHETER
Anesthesia: General | Site: Abdomen

## 2023-11-27 MED ORDER — AMISULPRIDE (ANTIEMETIC) 5 MG/2ML IV SOLN: 10.0000 mg | Freq: Once | INTRAVENOUS | Status: DC | PRN

## 2023-11-27 MED ORDER — ONDANSETRON HCL 4 MG/2ML IJ SOLN: 4.0000 mg | Freq: Once | INTRAMUSCULAR | Status: DC | PRN

## 2023-11-27 MED ORDER — MIDAZOLAM HCL 2 MG/2ML IJ SOLN
INTRAMUSCULAR | Status: DC | PRN
  Administered 2023-11-27: 2 mg via INTRAVENOUS

## 2023-11-27 MED ORDER — MIDAZOLAM HCL 2 MG/2ML IJ SOLN
INTRAMUSCULAR | Status: AC
  Filled 2023-11-27: qty 2

## 2023-11-27 MED ORDER — ESMOLOL HCL 100 MG/10ML IV SOLN
INTRAVENOUS | Status: DC | PRN
Start: 2023-11-27 — End: 2023-11-27
  Administered 2023-11-27: 20 mg via INTRAVENOUS
  Administered 2023-11-27 (×2): 10 mg via INTRAVENOUS

## 2023-11-27 MED ORDER — ORAL CARE MOUTH RINSE: 15.0000 mL | Freq: Once | OROMUCOSAL | Status: AC

## 2023-11-27 MED ORDER — DEXAMETHASONE SODIUM PHOSPHATE 10 MG/ML IJ SOLN
INTRAMUSCULAR | Status: DC | PRN
  Administered 2023-11-27: 10 mg via INTRAVENOUS

## 2023-11-27 MED ORDER — ACETAMINOPHEN 10 MG/ML IV SOLN
INTRAVENOUS | Status: DC | PRN
  Administered 2023-11-27: 1000 mg via INTRAVENOUS

## 2023-11-27 MED ORDER — HYDROMORPHONE HCL 1 MG/ML IJ SOLN
INTRAMUSCULAR | Status: DC | PRN
  Administered 2023-11-27: .5 mg via INTRAVENOUS

## 2023-11-27 MED ORDER — GLYCOPYRROLATE PF 0.2 MG/ML IJ SOSY
PREFILLED_SYRINGE | INTRAMUSCULAR | Status: DC | PRN
  Administered 2023-11-27: .1 mg via INTRAVENOUS
  Administered 2023-11-27: .3 mg via INTRAVENOUS

## 2023-11-27 MED ORDER — ONDANSETRON HCL 4 MG/2ML IJ SOLN
INTRAMUSCULAR | Status: DC | PRN
  Administered 2023-11-27: 4 mg via INTRAVENOUS

## 2023-11-27 MED ORDER — SODIUM CHLORIDE 0.9 % IV SOLN: INTRAVENOUS | Status: DC

## 2023-11-27 MED ORDER — HEPARIN SODIUM (PORCINE) 1000 UNIT/ML IJ SOLN: INTRAPERITONEAL | Status: DC | PRN

## 2023-11-27 MED ORDER — NEOSTIGMINE METHYLSULFATE 3 MG/3ML IV SOSY
PREFILLED_SYRINGE | INTRAVENOUS | Status: AC
Start: 2023-11-27 — End: 2023-11-27
  Filled 2023-11-27: qty 3

## 2023-11-27 MED ORDER — 0.9 % SODIUM CHLORIDE (POUR BTL) OPTIME
TOPICAL | Status: DC | PRN
  Administered 2023-11-27: 1000 mL

## 2023-11-27 MED ORDER — FENTANYL CITRATE (PF) 100 MCG/2ML IJ SOLN: 25.0000 ug | INTRAMUSCULAR | Status: DC | PRN

## 2023-11-27 MED ORDER — PHENYLEPHRINE 80 MCG/ML (10ML) SYRINGE FOR IV PUSH (FOR BLOOD PRESSURE SUPPORT)
PREFILLED_SYRINGE | INTRAVENOUS | Status: DC | PRN
  Administered 2023-11-27 (×2): 80 ug via INTRAVENOUS

## 2023-11-27 MED ORDER — DELFLEX-LC/2.5% DEXTROSE 394 MOSM/L IP SOLN: INTRAPERITONEAL | Status: DC

## 2023-11-27 MED ORDER — LIDOCAINE 2% (20 MG/ML) 5 ML SYRINGE
INTRAMUSCULAR | Status: DC | PRN
  Administered 2023-11-27: 80 mg via INTRAVENOUS

## 2023-11-27 MED ORDER — HYDROCODONE-ACETAMINOPHEN 5-325 MG PO TABS
1.0000 | ORAL_TABLET | ORAL | Status: DC | PRN
  Administered 2023-11-27: 1 via ORAL
  Filled 2023-11-27: qty 1

## 2023-11-27 MED ORDER — ACETAMINOPHEN 10 MG/ML IV SOLN
INTRAVENOUS | Status: AC
  Filled 2023-11-27: qty 100

## 2023-11-27 MED ORDER — FENTANYL CITRATE (PF) 250 MCG/5ML IJ SOLN
INTRAMUSCULAR | Status: DC | PRN
  Administered 2023-11-27: 50 ug via INTRAVENOUS
  Administered 2023-11-27: 100 ug via INTRAVENOUS

## 2023-11-27 MED ORDER — HYDROMORPHONE HCL 1 MG/ML IJ SOLN
INTRAMUSCULAR | Status: AC
  Filled 2023-11-27: qty 0.5

## 2023-11-27 MED ORDER — PROPOFOL 500 MG/50ML IV EMUL
INTRAVENOUS | Status: DC | PRN
  Administered 2023-11-27: 100 ug/kg/min via INTRAVENOUS

## 2023-11-27 MED ORDER — SODIUM CHLORIDE 0.9 % IR SOLN
Status: DC | PRN
  Administered 2023-11-27: 1

## 2023-11-27 MED ORDER — FENTANYL CITRATE (PF) 250 MCG/5ML IJ SOLN
INTRAMUSCULAR | Status: AC
  Filled 2023-11-27: qty 5

## 2023-11-27 MED ORDER — CHLORHEXIDINE GLUCONATE 0.12 % MT SOLN
OROMUCOSAL | Status: AC
Start: 2023-11-27 — End: 2023-11-27
  Filled 2023-11-27: qty 15

## 2023-11-27 MED ORDER — DEXMEDETOMIDINE HCL IN NACL 80 MCG/20ML IV SOLN
INTRAVENOUS | Status: DC | PRN
  Administered 2023-11-27: 8 ug via INTRAVENOUS
  Administered 2023-11-27: 4 ug via INTRAVENOUS
  Administered 2023-11-27: 8 ug via INTRAVENOUS
  Administered 2023-11-27 (×2): 4 ug via INTRAVENOUS

## 2023-11-27 MED ORDER — NEOSTIGMINE METHYLSULFATE 3 MG/3ML IV SOSY
PREFILLED_SYRINGE | INTRAVENOUS | Status: DC | PRN
Start: 2023-11-27 — End: 2023-11-27
  Administered 2023-11-27: 1 mg via INTRAVENOUS
  Administered 2023-11-27: 3 mg via INTRAVENOUS

## 2023-11-27 MED ORDER — GLYCOPYRROLATE PF 0.2 MG/ML IJ SOSY
PREFILLED_SYRINGE | INTRAMUSCULAR | Status: AC
  Filled 2023-11-27: qty 2

## 2023-11-27 MED ORDER — DEXTROSE 5 % IV SOLN
2000.0000 mg | Freq: Once | INTRAVENOUS | Status: AC
  Administered 2023-11-27: 2000 mg via INTRAVENOUS
  Filled 2023-11-27: qty 20

## 2023-11-27 MED ORDER — HYDROMORPHONE HCL 1 MG/ML IJ SOLN
0.5000 mg | INTRAMUSCULAR | Status: AC | PRN
  Administered 2023-11-27: 0.5 mg via INTRAVENOUS
  Filled 2023-11-27: qty 0.5

## 2023-11-27 MED ORDER — ROCURONIUM BROMIDE 10 MG/ML (PF) SYRINGE
PREFILLED_SYRINGE | INTRAVENOUS | Status: DC | PRN
  Administered 2023-11-27: 20 mg via INTRAVENOUS
  Administered 2023-11-27: 40 mg via INTRAVENOUS

## 2023-11-27 MED ORDER — BUPIVACAINE-EPINEPHRINE (PF) 0.25% -1:200000 IJ SOLN
INTRAMUSCULAR | Status: AC
  Filled 2023-11-27: qty 30

## 2023-11-27 MED ORDER — PROPOFOL 10 MG/ML IV BOLUS
INTRAVENOUS | Status: DC | PRN
  Administered 2023-11-27: 110 mg via INTRAVENOUS

## 2023-11-27 MED ORDER — CHLORHEXIDINE GLUCONATE 0.12 % MT SOLN
15.0000 mL | Freq: Once | OROMUCOSAL | Status: AC
  Administered 2023-11-27: 15 mL via OROMUCOSAL

## 2023-11-27 SURGICAL SUPPLY — 43 items
ADAPTER TITANIUM MEDIONICS (MISCELLANEOUS) ×3 IMPLANT
BAG DECANTER FOR FLEXI CONT (MISCELLANEOUS) ×3 IMPLANT
BIOPATCH RED 1 DISK 7.0 (GAUZE/BANDAGES/DRESSINGS) ×3 IMPLANT
BLADE CLIPPER SURG (BLADE) IMPLANT
BLADE SURG 11 STRL SS (BLADE) ×3 IMPLANT
CATH EXTENDED DIALYSIS (CATHETERS) ×3 IMPLANT
CHLORAPREP W/TINT 26 (MISCELLANEOUS) ×3 IMPLANT
CLIP APPLIE 5 13 M/L LIGAMAX5 (MISCELLANEOUS) IMPLANT
CNTNR URN SCR LID CUP LEK RST (MISCELLANEOUS) IMPLANT
COVER SURGICAL LIGHT HANDLE (MISCELLANEOUS) ×3 IMPLANT
DERMABOND ADVANCED .7 DNX12 (GAUZE/BANDAGES/DRESSINGS) ×3 IMPLANT
ELECTRODE REM PT RTRN 9FT ADLT (ELECTROSURGICAL) ×3 IMPLANT
GAUZE SPONGE 4X4 12PLY STRL (GAUZE/BANDAGES/DRESSINGS) ×3 IMPLANT
GLOVE BIOGEL PI IND STRL 8 (GLOVE) ×3 IMPLANT
GLOVE SURG SS PI 7.5 STRL IVOR (GLOVE) ×3 IMPLANT
GOWN STRL REUS W/ TWL LRG LVL3 (GOWN DISPOSABLE) ×6 IMPLANT
GOWN STRL REUS W/ TWL XL LVL3 (GOWN DISPOSABLE) ×3 IMPLANT
GRASPER SUT TROCAR 14GX15 (MISCELLANEOUS) ×3 IMPLANT
IRRIGATION SUCT STRKRFLW 2 WTP (MISCELLANEOUS) IMPLANT
IV NS 1000ML BAXH (IV SOLUTION) ×3 IMPLANT
KIT BASIN OR (CUSTOM PROCEDURE TRAY) ×3 IMPLANT
KIT TURNOVER KIT B (KITS) ×3 IMPLANT
NS IRRIG 1000ML POUR BTL (IV SOLUTION) ×3 IMPLANT
PAD ARMBOARD POSITIONER FOAM (MISCELLANEOUS) ×6 IMPLANT
PENCIL BUTTON HOLSTER BLD 10FT (ELECTRODE) IMPLANT
SET CYSTO W/LG BORE CLAMP LF (SET/KITS/TRAYS/PACK) ×3 IMPLANT
SET EXT 12IN DIALYSIS STAY-SAF (MISCELLANEOUS) ×3 IMPLANT
SET TUBE SMOKE EVAC HIGH FLOW (TUBING) ×3 IMPLANT
SLEEVE Z-THREAD 5X100MM (TROCAR) ×3 IMPLANT
SPIKE FLUID TRANSFER (MISCELLANEOUS) ×3 IMPLANT
STOPCOCK MORSE 400PSI 3WAY (MISCELLANEOUS) IMPLANT
STYLET FALLER (MISCELLANEOUS) ×3 IMPLANT
SUT MNCRL AB 4-0 PS2 18 (SUTURE) ×6 IMPLANT
SUT PROLENE 0 SH 30 (SUTURE) ×6 IMPLANT
SUT SILK 0 TIES 10X30 (SUTURE) ×3 IMPLANT
TAPE CLOTH SURG 4X10 WHT LF (GAUZE/BANDAGES/DRESSINGS) ×3 IMPLANT
TOWEL GREEN STERILE (TOWEL DISPOSABLE) ×3 IMPLANT
TOWEL GREEN STERILE FF (TOWEL DISPOSABLE) ×3 IMPLANT
TRAY LAPAROSCOPIC MC (CUSTOM PROCEDURE TRAY) ×3 IMPLANT
TROCAR 11X100 Z THREAD (TROCAR) IMPLANT
TROCAR 5MMX150MM (TROCAR) ×3 IMPLANT
TROCAR Z-THREAD FIOS 5X100MM (TROCAR) ×3 IMPLANT
WATER STERILE IRR 1000ML POUR (IV SOLUTION) ×3 IMPLANT

## 2023-11-27 NOTE — Assessment & Plan Note (Addendum)
 Continue blood pressure control with amlodipine  and ARB will be resumed at the time of discharge.

## 2023-11-27 NOTE — Hospital Course (Signed)
 [name] was admitted to peritoneal dialysis catheter malfunctioning   38 yo male with ESRD and has been on peritoneal dialysis over the last 4 years. Unfortunately his catheter is malfunctioning, not able to drain dialysis fluid. He called his nephrologist and he was referred to the ED. In the past he was on HD, using a right wrist fistula that now is non functioning. He dose not make urine. At the time of admission  BP (!) 152/94   Pulse (!) 105   Temp 98.7 F (37.1 C)   Resp 20   Ht 6' 3 (1.905 m)   Wt 81.6 kg   SpO2 98%   BMI 22.49 kg/m  Cardiovascular with S1 and S2 present and regular, no rubs, or murmurs Respiratory with no wheezing or rhonchi  Abdomen with no distention, non tender, soft to palpation, PD catheter in place, with dressing no apparent erythema  No lower extremity edema   Na 136, K 4.2 CL 102 bicarbonate 20 glucose 90 bun 70 cr 23.2  Wbc 7.0 hgb 10.0 plt 326    Chest radiograph with hypoinflation, mild cardiomegaly and hilar vascular congestion, no infiltrates and no effusions, positive air under bilateral diaphragms.  Abdominal radiograph with peritoneal catheter in place.   Vascular surgery consulted for HD access.  08/24 replaced peritoneal HD catheter and resume dialysis.  08/25 tolerated well peritoneal dialysis 08/26 continue to improve, plan to continue with PD at home.

## 2023-11-27 NOTE — Progress Notes (Signed)
 Progress Note   Patient: Micheal Bean FMW:[id number] DOB: 23-Dec-1985 DOA: 11/26/2023     0 DOS: the patient was seen and examined on 11/27/2023   Brief hospital course: Micheal Bean was admitted to peritoneal dialysis catheter malfunctioning   38 yo male with ESRD and has been on peritoneal dialysis over the last 4 years. Unfortunately his catheter is malfunctioning, not able to drain dialysis fluid. He called his nephrologist and he was referred to the ED. In the past he was on HD, using a right wrist fistula that now is non functioning. He dose not make urine. At the time of admission  BP (!) 152/94   Pulse (!) 105   Temp 98.7 F (37.1 C)   Resp 20   Ht 6' 3 (1.905 m)   Wt 81.6 kg   SpO2 98%   BMI 22.49 kg/m  Cardiovascular with S1 and S2 present and regular, no rubs, or murmurs Respiratory with no wheezing or rhonchi  Abdomen with no distention, non tender, soft to palpation, PD catheter in place, with dressing no apparent erythema  No lower extremity edema   Na 136, K 4.2 CL 102 bicarbonate 20 glucose 90 bun 70 cr 23.2  Wbc 7.0 hgb 10.0 plt 326    Chest radiograph with hypoinflation, mild cardiomegaly and hilar vascular congestion, no infiltrates and no effusions, positive air under bilateral diaphragms.  Abdominal radiograph with peritoneal catheter in place.   Vascular surgery consulted for HD access.   Assessment and Plan: * Hemodialysis catheter malfunction (HCC) Not able to pass saline or Cath flo into the PD catheter.  Possible pinprick holes in the catheter near the connection to the PD catheter and Fresenius extension.  Placed on prophylactic antibiotic therapy with IV vancomycin .  Follow up with vascular surgery for dialysis access.   End stage renal disease (HCC) No signs of volume overload.  Renal function today with K at 4,5 serum bicarbonate 19 and BUN 80 Na 135. Azotemia with no uremic symptoms.  Follow up with nephrology for renal replacement therapy.    Essential hypertension Blood pressure systolic 130 mmHg range  Will continue amlodipine . Hold on ARB due to risk of hyperkalemia. Continue blood pressure monitoring       Subjective: Patient somnolent post procedure, new PD catheter placed,   Physical Exam: Vitals:   11/26/23 1900 11/26/23 2300 11/27/23 0300 11/27/23 0731  BP: (!) 140/86 (!) 144/92 133/86 135/89  Pulse: 96 (!) 106 (!) 110 (!) 109  Resp: 14 11 (!) 25 20  Temp: 98.4 F (36.9 C) 98.4 F (36.9 C) 98.5 F (36.9 C) 98.1 F (36.7 C)  TempSrc: Oral Oral Oral Oral  SpO2: 97% 99% 98% 97%  Weight:      Height:       Neurology somnolent but easy to arouse ENT With mild pallor Cardiovascular with S1 and S2 present and regular with no gallops or rubs Respiratory with no wheezing or rales, no rhonchi  Abdomen with on distention, PD cathter in place No lower extremity edema  Data Reviewed:    Family Communication: I spoke with patient's mother at the bedside, we talked in detail about patient's condition, plan of care and prognosis and all questions were addressed.   Disposition: Status is: Observation The patient will require care spanning > 2 midnights and should be moved to inpatient because: renal replacement therapy   Planned Discharge Destination: Home     Author: Elidia Toribio Furnace, MD 11/27/2023 9:00 AM  For  on call review [email or web address].

## 2023-11-27 NOTE — Anesthesia Procedure Notes (Signed)
 Procedure Name: Intubation Date/Time: 11/27/2023 10:33 AM  Performed by: Mollie Olivia SAUNDERS, CRNAPre-anesthesia Checklist: Patient identified, Emergency Drugs available, Suction available and Patient being monitored Patient Re-evaluated:Patient Re-evaluated prior to induction Oxygen Delivery Method: Circle system utilized Preoxygenation: Pre-oxygenation with 100% oxygen Induction Type: IV induction Ventilation: Mask ventilation without difficulty Laryngoscope Size: Mac and 4 Grade View: Grade II Tube type: Oral Tube size: 7.5 mm Number of attempts: 1 Airway Equipment and Method: Stylet Placement Confirmation: ETT inserted through vocal cords under direct vision, positive ETCO2 and breath sounds checked- equal and bilateral Secured at: 23 cm Tube secured with: Tape Dental Injury: Teeth and Oropharynx as per pre-operative assessment

## 2023-11-27 NOTE — Op Note (Signed)
    Patient name: Micheal Bean MRN: [id number] DOB: March 14, 1986 Sex: male  11/27/2023 Pre-operative Diagnosis: Nonfunctioning peritoneal dialysis catheter Post-operative diagnosis:  Same Surgeon:  Malvina New Assistants:  Adina Sender, PA Procedure:   #1: Removal of existing peritoneal dialysis catheter   #2: Laparoscopic placement of peritoneal dialysis catheter Anesthesia:  General Blood Loss:  minimal Specimens: Catheter tip from existing peritoneal catheter sent for culture  Findings: No significant adhesive disease found  Indications: This is a 38 year old gentleman who presented to the hospital with a nonfunctioning peritoneal catheter.  We tried to use Cathflo but this did not work and so he is here for new catheter.  Procedure:  The patient was identified in the holding area and taken to [facility] OR ROOM 11  The patient was then placed supine on the table. general anesthesia was administered.  The patient was prepped and draped in the usual sterile fashion.  A time out was called and antibiotics were administered.  A PA was necessary to expedite the procedure and assist with technical details.  He helped with removal of the catheter and driving camera and wound closure.  I tried to flush the existing catheter but it would not flush.  We then removed the cuff at the skin exit site and made 2 additional incisions over top of the existing cuffs and dissected these out and were able to remove the existing peritoneal catheter in its entirety.  The tip was sent for culture.  Next a 11 blade was used to make a skin nick and a right upper quadrant 5 mm port was inserted using the Optiview.  The abdomen was then insufflated.  A angled scope was then inserted.  No significant adhesive disease was encountered.  I then placed a second 5 mm port in the right lower quadrant.  An incision was then made near the umbilicus and I beveled a 8 mm port and entered the abdomen under direct vision in the suprapubic  region.  A pigtail peritoneal dialysis catheter was inserted through this port which was then removed.  The cuff was situated just above the fascia.  I then cut the catheter appropriately and connected the metal connector to the 2 ends.  Using a sharp trocar, I tunneled the catheter up towards the xiphoid and made a separate incision and brought the catheter out this level and then retunneled the catheter coming out in the left upper quadrant.  The connector devices were then appropriately placed.  The catheter was connected to saline which instilled fluid via gravity.  The bag was then brought to the floor and I evacuated the existing fluid.  The camera was then reinserted.  The abdomen was again inspected.  There was no bleeding.  The catheter was in good position in the pelvis.  The abdomen was then desufflated.  The ports were then removed.  The skin incisions were closed with Monocryl and Dermabond.  There were no immediate complications.  He was successfully extubated and taken recovery in stable condition.   Disposition: To PACU stable.   ALONSO Malvina New, M.D., [facility] [place] Office: [phone] Pager:  [phone]

## 2023-11-27 NOTE — Plan of Care (Signed)

## 2023-11-27 NOTE — Interval H&P Note (Signed)
 History and Physical Interval Note:  11/27/2023 9:38 AM  Micheal Bean  has presented today for surgery, with the diagnosis of ESRD.  The various methods of treatment have been discussed with the patient and family. After consideration of risks, benefits and other options for treatment, the patient has consented to  Procedure(s): LAPAROSCOPIC INSERTION CONTINUOUS AMBULATORY PERITONEAL DIALYSIS  (CAPD) CATHETER (N/A) as a surgical intervention.  The patient's history has been reviewed, patient examined, no change in status, stable for surgery.  I have reviewed the patient's chart and labs.  Questions were answered to the patient's satisfaction.     Malvina New

## 2023-11-27 NOTE — Progress Notes (Signed)
 PD tx initation note:   Pre TX VS:   Pre TX weight:   PD treatment initiated via aseptic technique. Consent signed and in chart. Patient is alert and oriented. No complaints of pain. PD exit site clean, dry and intact. Bedside RN educated on PD machine and how to contact tech support when PD machine alarms.PD tx initation note:    11/27/23 2111  Peritoneal Catheter Left lower abdomen Continuous ambulatory  Placement Date/Time: 11/27/23 1140   Serial / Lot #: [id number]  Expiration Date: 02/02/28  Procedural Verification: Site marked with initials;Medical records & consent reviewed;Relevant studies,results and images reviewed  Time out: Correct Patient;Corre...  Site Assessment Clean, Dry, Intact  Drainage Description None  Catheter status Deaccessed  Dressing Gauze/Drain sponge  Dressing Status Clean, Dry, Intact  Dressing Intervention New dressing/dressing changed  Cycler Setup  Total Number of Night Cycles 6  Night Fill Volume 1000  Dianeal Solution Dextrose  4.25% in 2000 mL Low Cal/Low Mag  Night Dwell Time per Cycle - Hour(s) 1  Night Dwell Time per Cycle - Minute(s) 30  Night Time Therapy - Minute(s) 40  Night Time Therapy - Hour(s) 10  Maximum Peritoneal Volume 1500  Night/Total Therapy Volume 6000  Day Exchange No  Completion  Treatment Status Started  Effluent Appearance Amber;Cloudy  Cell Count on Daytime Exchange N/A  Hand-off documentation  Hand-off Given Given to shift RN/LPN  Report given to (Full Name) Tim,RN  Hand-off Received Received from shift RN/LPN  Report received from (Full Name) Stanislaus Kaltenbach, RN

## 2023-11-27 NOTE — Anesthesia Postprocedure Evaluation (Signed)
 Anesthesia Post Note  Patient: JAQUANE BOUGHNER  Procedure(s) Performed: LAPAROSCOPIC INSERTION CONTINUOUS AMBULATORY PERITONEAL DIALYSIS  (CAPD) CATHETER REVISION, CATHETER, CAPD, LAPAROSCOPIC (REMOVAL) (Abdomen)     Patient location during evaluation: Nursing Unit Anesthesia Type: General Level of consciousness: awake and alert Pain management: pain level controlled Vital Signs Assessment: post-procedure vital signs reviewed and stable Respiratory status: spontaneous breathing, nonlabored ventilation and respiratory function stable Cardiovascular status: blood pressure returned to baseline and stable Postop Assessment: no apparent nausea or vomiting Anesthetic complications: no   No notable events documented.  Last Vitals:  Vitals:   11/27/23 1313 11/27/23 1600  BP: 128/77 132/79  Pulse: 71 69  Resp: 20 18  Temp:  36.5 C  SpO2: 99% 96%    Last Pain:  Vitals:   11/27/23 1749  TempSrc:   PainSc: 2                  Garnette FORBES Skillern

## 2023-11-27 NOTE — Assessment & Plan Note (Signed)
 Possible pinprick holes in the catheter near the connection to the PD catheter and [employer]en[employer] extension. 08/24 removal of existing peritoneal dialysis catheter and laparoscopic placement of peritoneal dialysis catheter.   Placed on prophylactic antibiotic therapy with IV vancomycin .  Today wbc is 12

## 2023-11-27 NOTE — Transfer of Care (Signed)
 Immediate Anesthesia Transfer of Care Note  Patient: Micheal Bean  Procedure(s) Performed: LAPAROSCOPIC INSERTION CONTINUOUS AMBULATORY PERITONEAL DIALYSIS  (CAPD) CATHETER  Patient Location: PACU  Anesthesia Type:General  Level of Consciousness: awake, alert , oriented, and drowsy  Airway & Oxygen Therapy: Patient Spontanous Breathing and Patient connected to face mask oxygen  Post-op Assessment: Report given to RN, Post -op Vital signs reviewed and stable, and Patient moving all extremities X 4  Post vital signs: Reviewed and stable  Last Vitals:  Vitals Value Taken Time  BP 140/78 11/27/23 12:17  Temp 36.4 C 11/27/23 12:17  Pulse 84 11/27/23 12:22  Resp 25 11/27/23 12:22  SpO2 98 % 11/27/23 12:22  Vitals shown include unfiled device data.  Last Pain:  Vitals:   11/27/23 0955  TempSrc:   PainSc: 0-No pain         Complications: No notable events documented.

## 2023-11-27 NOTE — Progress Notes (Signed)
  [facility] Progress Note   Subjective: Seen and examined in room. No complaints. No f/c, n/v, abd pain. Unable push fluid through the PD catheter last night. VVS taking to OR today for cathter exchange.   Objective Vitals:   11/26/23 1900 11/26/23 2300 11/27/23 0300 11/27/23 0731  BP: (!) 140/86 (!) 144/92 133/86 135/89  Pulse: 96 (!) 106 (!) 110 (!) 109  Resp: 14 11 (!) 25 20  Temp: 98.4 F (36.9 C) 98.4 F (36.9 C) 98.5 F (36.9 C) 98.1 F (36.7 C)  TempSrc: Oral Oral Oral Oral  SpO2: 97% 99% 98% 97%  Weight:      Height:         Additional Objective Labs: Basic Metabolic Panel: Recent Labs  Lab 11/26/23 0103 11/27/23 0353  NA 136 135  K 4.2 4.5  CL 102 102  CO2 20* 19*  GLUCOSE 90 76  BUN 70* 80*  CREATININE 23.20* 24.52*  CALCIUM  9.1 8.5*  PHOS  --  4.0   CBC: Recent Labs  Lab 11/26/23 0103  WBC 7.0  HGB 10.0*  HCT 32.6*  MCV 90.3  PLT 326   Blood Culture    Component Value Date/Time   SDES BLOOD LEFT FOREARM 11/14/2020 2320   SPECREQUEST  11/14/2020 2320    BOTTLES DRAWN AEROBIC ONLY Blood Culture adequate volume   CULT  11/14/2020 2320    NO GROWTH 5 DAYS Performed at [facility], [address] [place], [place] [address]    REPTSTATUS 11/20/2020 FINAL 11/14/2020 2320     Physical Exam General: Alert, nad  Heart: RRR Resp:  Normal wob Abdomen: non -tender Extremities: no LE edema  Dialysis Access: PD cath in place   Medications:   [MAR Hold] amLODipine   10 mg Oral Daily   [MAR Hold] calcitRIOL   0.5 mcg Oral Daily   [MAR Hold] calcium  acetate  1,334 mg Oral TID with meals   [MAR Hold] gentamicin  cream  1 Application Topical Daily   [MAR Hold] heparin   5,000 Units Subcutaneous Q8H   [MAR Hold] multivitamin  1 tablet Oral Q breakfast   [MAR Hold] sevelamer  carbonate  3,200 mg Oral TID WC   [MAR Hold] sodium chloride  flush  3 mL Intravenous Q12H   [MAR Hold] sucroferric oxyhydroxide  1,000 mg Oral TID with  meals   [MAR Hold] Tenapanor  HCl (CKD)  30 mg Oral Daily    Dialysis Orders:  CCPD  6 exchanges  Fill vol 2800 ml  Dwell time 1 hr  1 daytime fill 2000 ml  EDW 73.5kg   Assessment/Plan: Malfunctioning peritoneal dialysis catheter: Has not been working since Wednesday. Unable to push or drain fluid.  Tried lactulose without effect.  Denies history of constipation.  On peritoneal dialysis for 4 years.  VVS consulting.  KUB shows PD cath in the lower abdomen. Creat up, wt's up but not having any resp issues or uremic findings. To OR today for PD cath eval/exchange.   ESRD: CCPD nightly. Plan for PD cath exchange as above. Low volume tonight PD after surgery.  HTN: bp's slightly high, cont home meds Volume: is up 8kg by wts, follow.  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 Follow.     Micheal Ronnald Acosta PA-C [facility] 11/27/2023,9:51 AM

## 2023-11-27 NOTE — Care Management Obs Status (Signed)
 MEDICARE OBSERVATION STATUS NOTIFICATION   Patient Details  Name: MANOLITO JUREWICZ MRN: [id number] Date of Birth: [birth year]   Medicare Observation Status Notification Given:  Yes    Carletha Spruce, RN 11/27/2023, 8:42 AM

## 2023-11-27 NOTE — Assessment & Plan Note (Signed)
 No signs of volume overload.  Renal function today with K at 4,5 serum bicarbonate 19 and BUN 80 Na 135. Azotemia with no uremic symptoms.  Follow up with nephrology for renal replacement therapy.

## 2023-11-27 NOTE — Anesthesia Preprocedure Evaluation (Signed)
 Anesthesia Evaluation  Patient identified by MRN, date of birth, ID band Patient awake    Reviewed: Allergy & Precautions, NPO status , Patient's Chart, lab work & pertinent test results  Airway Mallampati: II  TM Distance: >3 FB Neck ROM: Full    Dental  (+) Teeth Intact, Dental Advisory Given, Poor Dentition   Pulmonary neg pulmonary ROS   Pulmonary exam normal breath sounds clear to auscultation       Cardiovascular hypertension, Pt. on medications Normal cardiovascular exam Rhythm:Regular Rate:Normal  07/2021 stress ECHO: EF 60% baseline, 80% stress, normal regional wall motion, throughout     Neuro/Psych Seizures -,     GI/Hepatic negative GI ROS, Neg liver ROS,,,  Endo/Other  negative endocrine ROS    Renal/GU ESRF and DialysisRenal disease (PD at home)     Musculoskeletal negative musculoskeletal ROS (+)    Abdominal   Peds  Hematology  (+) Blood dyscrasia, anemia   Anesthesia Other Findings Day of surgery medications reviewed with the patient.  Reproductive/Obstetrics                              Anesthesia Physical Anesthesia Plan  ASA: 3  Anesthesia Plan: General   Post-op Pain Management: Tylenol  PO (pre-op)*   Induction: Intravenous  PONV Risk Score and Plan: 2 and Dexamethasone , Ondansetron  and Midazolam   Airway Management Planned: Oral ETT  Additional Equipment:   Intra-op Plan:   Post-operative Plan: Extubation in OR  Informed Consent: I have reviewed the patients History and Physical, chart, labs and discussed the procedure including the risks, benefits and alternatives for the proposed anesthesia with the patient or authorized representative who has indicated his/her understanding and acceptance.     Dental advisory given  Plan Discussed with: CRNA  Anesthesia Plan Comments:          Anesthesia Quick Evaluation

## 2023-11-28 ENCOUNTER — Encounter (HOSPITAL_COMMUNITY): Payer: Self-pay | Admitting: Surgery

## 2023-11-28 DIAGNOSIS — Z9889 Other specified postprocedural states: Secondary | ICD-10-CM

## 2023-11-28 DIAGNOSIS — Z992 Dependence on renal dialysis: Secondary | ICD-10-CM | POA: Diagnosis not present

## 2023-11-28 DIAGNOSIS — I10 Essential (primary) hypertension: Secondary | ICD-10-CM | POA: Diagnosis not present

## 2023-11-28 DIAGNOSIS — N186 End stage renal disease: Secondary | ICD-10-CM | POA: Diagnosis not present

## 2023-11-28 LAB — CBC
HCT: 29.1 % — ABNORMAL LOW (ref 39.0–52.0)
Hemoglobin: 9.1 g/dL — ABNORMAL LOW (ref 13.0–17.0)
MCH: 27.4 pg (ref 26.0–34.0)
MCHC: 31.3 g/dL (ref 30.0–36.0)
MCV: 87.7 fL (ref 80.0–100.0)
Platelets: 408 K/uL — ABNORMAL HIGH (ref 150–400)
RBC: 3.32 MIL/uL — ABNORMAL LOW (ref 4.22–5.81)
RDW: 16.4 % — ABNORMAL HIGH (ref 11.5–15.5)
WBC: 12 K/uL — ABNORMAL HIGH (ref 4.0–10.5)
nRBC: 0 % (ref 0.0–0.2)

## 2023-11-28 LAB — RENAL FUNCTION PANEL
Albumin: 1.9 g/dL — ABNORMAL LOW (ref 3.5–5.0)
Anion gap: 15 (ref 5–15)
BUN: 82 mg/dL — ABNORMAL HIGH (ref 6–20)
CO2: 19 mmol/L — ABNORMAL LOW (ref 22–32)
Calcium: 8.8 mg/dL — ABNORMAL LOW (ref 8.9–10.3)
Chloride: 94 mmol/L — ABNORMAL LOW (ref 98–111)
Creatinine, Ser: 23.93 mg/dL — ABNORMAL HIGH (ref 0.61–1.24)
GFR, Estimated: 2 mL/min — ABNORMAL LOW (ref >60)
Glucose, Bld: 198 mg/dL — ABNORMAL HIGH (ref 70–99)
Phosphorus: 3.3 mg/dL (ref 2.5–4.6)
Potassium: 4.5 mmol/L (ref 3.5–5.1)
Sodium: 128 mmol/L — ABNORMAL LOW (ref 135–145)

## 2023-11-28 MED ORDER — PHENOL 1.4 % MT LIQD
1.0000 | OROMUCOSAL | Status: DC | PRN
  Filled 2023-11-28: qty 177

## 2023-11-28 MED ORDER — VANCOMYCIN VARIABLE DOSE PER UNSTABLE RENAL FUNCTION (PHARMACIST DOSING): Status: DC

## 2023-11-28 NOTE — Progress Notes (Addendum)
 Contacted [facility] home therapy dept and left a message requesting a return call. Need to inquire if pt receives PD care at clinic. Will await a return call. Will assist as needed.   Randine Mungo Dialysis Navigator [phone]  Addendum at 2:50 pm; Confirmed pt receives out-pt PD care at [facility] home therapy dept. Update provided to RN who will update pt's PD RN.

## 2023-11-28 NOTE — Progress Notes (Signed)
  Progress Note    11/28/2023 8:26 AM 1 Day Post-Op  Subjective:  no complaints   Vitals:   11/27/23 2335 11/28/23 0223  BP: (!) 152/88 131/67  Pulse: 74 71  Resp: 14 15  Temp: 97.7 F (36.5 C) 97.9 F (36.6 C)  SpO2: 98% 99%   Physical Exam: Lungs:  non labored Neurologic: A&O  CBC    Component Value Date/Time   WBC 12.0 (H) 11/28/2023 0345   RBC 3.32 (L) 11/28/2023 0345   HGB 9.1 (L) 11/28/2023 0345   HCT 29.1 (L) 11/28/2023 0345   PLT 408 (H) 11/28/2023 0345   MCV 87.7 11/28/2023 0345   MCH 27.4 11/28/2023 0345   MCHC 31.3 11/28/2023 0345   RDW 16.4 (H) 11/28/2023 0345   LYMPHSABS 2.5 05/12/2023 1723   MONOABS 0.7 05/12/2023 1723   EOSABS 0.3 05/12/2023 1723   BASOSABS 0.1 05/12/2023 1723    BMET    Component Value Date/Time   NA 128 (L) 11/28/2023 0345   K 4.5 11/28/2023 0345   CL 94 (L) 11/28/2023 0345   CO2 19 (L) 11/28/2023 0345   GLUCOSE 198 (H) 11/28/2023 0345   BUN 82 (H) 11/28/2023 0345   CREATININE 23.93 (H) 11/28/2023 0345   CALCIUM  8.8 (L) 11/28/2023 0345   GFRNONAA 2 (L) 11/28/2023 0345   GFRAA (L) 07/07/2009 0955    56        The eGFR has been calculated using the MDRD equation. This calculation has not been validated in all clinical situations. eGFR's persistently <60 mL/min signify possible Chronic Kidney Disease.    INR    Component Value Date/Time   INR 1.2 05/13/2023 0601     Intake/Output Summary (Last 24 hours) at 11/28/2023 9173 Last data filed at 11/27/2023 2000 Gross per 24 hour  Intake 820 ml  Output 505 ml  Net 315 ml     Assessment/Plan:  38 y.o. male is s/p removal of nonfunctioning PD catheter and placement of new PD catheter 1 Day Post-Op   Abdomen pain tolerable Per patient, PD catheter worked well overnight Ok for discharge from vascular standpoint; patient can follow up as needed   Donnice Sender, PA-C [facility] [phone] 11/28/2023 8:26 AM

## 2023-11-28 NOTE — Progress Notes (Signed)
  [facility] Progress Note   Subjective: Seen in room. PD catheter replaced in OR yesterday. Completed PD overnight - no issues with catheter. Tolerated well. No abd pain, n/v. Does endorse sore throat this am.   Objective Vitals:   11/27/23 1600 11/27/23 2010 11/27/23 2335 11/28/23 0223  BP: 132/79 133/89 (!) 152/88 131/67  Pulse: 69 72 74 71  Resp: 18 19 14 15   Temp: 97.7 F (36.5 C) 97.8 F (36.6 C) 97.7 F (36.5 C) 97.9 F (36.6 C)  TempSrc: Oral Oral Oral Oral  SpO2: 96% 98% 98% 99%  Weight:      Height:         Additional Objective Labs: Basic Metabolic Panel: Recent Labs  Lab 11/26/23 0103 11/27/23 0353 11/28/23 0345  NA 136 135 128*  K 4.2 4.5 4.5  CL 102 102 94*  CO2 20* 19* 19*  GLUCOSE 90 76 198*  BUN 70* 80* 82*  CREATININE 23.20* 24.52* 23.93*  CALCIUM  9.1 8.5* 8.8*  PHOS  --  4.0 3.3   CBC: Recent Labs  Lab 11/26/23 0103 11/28/23 0345  WBC 7.0 12.0*  HGB 10.0* 9.1*  HCT 32.6* 29.1*  MCV 90.3 87.7  PLT 326 408*   Blood Culture    Component Value Date/Time   SDES TISSUE 11/27/2023 1131   SPECREQUEST PORTION OF PERITONEAL DIALYSIS CATH 11/27/2023 1131   CULT  11/27/2023 1131    CULTURE REINCUBATED FOR BETTER GROWTH Performed at [facility], [address], [place], [place] [address]    REPTSTATUS PENDING 11/27/2023 1131     Physical Exam General: Alert, nad  Heart: RRR Resp:  Normal wob Abdomen: non -tender Extremities: no LE edema  Dialysis Access: PD cath in place   Medications:  dialysis solution 2.5% low-MG/low-CA      amLODipine   10 mg Oral Daily   calcitRIOL   0.5 mcg Oral Daily   calcium  acetate  1,334 mg Oral TID with meals   gentamicin  cream  1 Application Topical Daily   heparin   5,000 Units Subcutaneous Q8H   multivitamin  1 tablet Oral Q breakfast   sevelamer  carbonate  3,200 mg Oral TID WC   sodium chloride  flush  3 mL Intravenous Q12H   sucroferric oxyhydroxide  1,000 mg Oral TID with  meals   Tenapanor  HCl (CKD)  30 mg Oral Daily    Dialysis Orders:  CCPD  6 exchanges  Fill vol 2800 ml  Dwell time 1 hr  1 daytime fill 2000 ml  EDW 73.5kg   Assessment/Plan: Malfunctioning peritoneal dialysis catheter: Has not been working since Wednesday. Unable to push or drain fluid.  Tried lactulose without effect.  Denies history of constipation.  On peritoneal dialysis for 4 years.  VVS consulting.  KUB shows PD cath in the lower abdomen. PD catheter replaced 8/24. Appreciate VVS assistance. Cath tip culture sent - pending.  ESRD: Continue CCPD nightly. No issues with new PD catheter last night. HTN: BP acceptable. Continue home meds.  Volume: is up 8kg by wts, follow.  Anemia of esrd: Hb 9-10 no esa needs. Follow.    Maisie Ronnald Acosta PA-C [facility] 11/28/2023,8:46 AM

## 2023-11-28 NOTE — Progress Notes (Signed)
   11/28/23 0900  Peritoneal Catheter Left lower abdomen Continuous ambulatory  Placement Date/Time: 11/27/23 1140   Serial / Lot #: [id number]  Expiration Date: 02/02/28  Procedural Verification: Site marked with initials;Medical records & consent reviewed;Relevant studies,results and images reviewed  Time out: Correct Patient;Corre...  Catheter status Accessed  Dressing Gauze/Drain sponge  Dressing Status Clean, Dry, Intact  Completion  Treatment Status Complete  Initial Drain Volume 316  Average Dwell Time-Hour(s) 1  Average Dwell Time-Min(s) 30  Average Drain Time 9  Total Therapy Volume 5997  Total Therapy Time-Hour(s) 10  Total Therapy Time-Min(s) 36  Effluent Appearance Amber;Clear  Cell Count on Daytime Exchange N/A  Fluid Balance - CCPD  Total UF (+ value on cycler, pt loss) 90 mL  Procedure Comments  Tolerated treatment well? Yes  Peritoneal Dialysis Comments Patient stated that he had no issues during the night.   Patient stated he had no pain with either fill or dwell actions. Patient stated he had minor pain(2) in his left flank and a headache. Patient's nurse notified

## 2023-11-28 NOTE — Progress Notes (Addendum)
 Progress Note   Patient: Micheal Bean FMW:[id number] DOB: [birth year] DOA: 11/26/2023     1 DOS: the patient was seen and examined on 11/28/2023   Brief hospital course: Micheal Bean was admitted to peritoneal dialysis catheter malfunctioning   38 yo male with ESRD and has been on peritoneal dialysis over the last 4 years. Unfortunately his catheter is malfunctioning, not able to drain dialysis fluid. He called his nephrologist and he was referred to the ED. In the past he was on HD, using a right wrist fistula that now is non functioning. He dose not make urine. At the time of admission  BP (!) 152/94   Pulse (!) 105   Temp 98.7 F (37.1 C)   Resp 20   Ht 6' 3 (1.905 m)   Wt 81.6 kg   SpO2 98%   BMI 22.49 kg/m  Cardiovascular with S1 and S2 present and regular, no rubs, or murmurs Respiratory with no wheezing or rhonchi  Abdomen with no distention, non tender, soft to palpation, PD catheter in place, with dressing no apparent erythema  No lower extremity edema   Na 136, K 4.2 CL 102 bicarbonate 20 glucose 90 bun 70 cr 23.2  Wbc 7.0 hgb 10.0 plt 326    Chest radiograph with hypoinflation, mild cardiomegaly and hilar vascular congestion, no infiltrates and no effusions, positive air under bilateral diaphragms.  Abdominal radiograph with peritoneal catheter in place.   Vascular surgery consulted for HD access.  08/24 replaced peritoneal HD catheter and resume dialysis.    Assessment and Plan: * Peritoneal dialysis catheter dysfunction (HCC) Possible pinprick holes in the catheter near the connection to the PD catheter and [employer] extension. 08/24 removal of existing peritoneal dialysis catheter and laparoscopic placement of peritoneal dialysis catheter.   Placed on prophylactic antibiotic therapy with IV vancomycin .  Today wbc is 12  End-stage renal disease on peritoneal dialysis (HCC) Hyponatremia.  Resume PD last night.,  Azotemia with mild uremic symptoms today  With  BUN 82, K 4.5 and serum bicarbonate at 19  Na 128 P 3.3   Continue inpatient PD until improvement in uremic symptoms.  Follow up with nephrology recommendations.   Anemia of chronic renal disease, with iron deficiency, continue with iron supplementation.   Metabolic bone disease continue with sevelamer  and phoslo .  Calcitriol .  Essential hypertension Blood pressure systolic 130 mmHg range  Continue amlodipine . Hold on ARB due to risk of hyperkalemia. Continue blood pressure monitoring      Subjective: Patient not feeling well today, positive headache and blurry vision, no chest pain, no dyspnea or edema   Physical Exam: Vitals:   11/27/23 2010 11/27/23 2335 11/28/23 0223 11/28/23 0903  BP: 133/89 (!) 152/88 131/67 (!) 146/94  Pulse: 72 74 71 79  Resp: 19 14 15 20   Temp: 97.8 F (36.6 C) 97.7 F (36.5 C) 97.9 F (36.6 C) 97.7 F (36.5 C)  TempSrc: Oral Oral Oral Oral  SpO2: 98% 98% 99% 96%  Weight:      Height:       Neurology awake and alert, deconditioned and ill looking appearing ENT with mild pallor with no icterus Cardiovascular with S1 and S2 present and regular with no gallops or rubs Respiratory with no rales or wheezing, no rhonchi  Abdomen with no distention, soft and non tender to superficial palpation, PD catheter in place No lower extremity edema  Data Reviewed:    Family Communication: no family at the bedside   Disposition: Status  is: Inpatient Remains inpatient appropriate because: resuming dialysis   Planned Discharge Destination: Home     Author: Elidia Toribio Furnace, MD 11/28/2023 10:03 AM  For on call review [email or web address].

## 2023-11-29 DIAGNOSIS — T85611A Breakdown (mechanical) of intraperitoneal dialysis catheter, initial encounter: Secondary | ICD-10-CM

## 2023-11-29 DIAGNOSIS — Z992 Dependence on renal dialysis: Secondary | ICD-10-CM | POA: Diagnosis not present

## 2023-11-29 DIAGNOSIS — N186 End stage renal disease: Secondary | ICD-10-CM | POA: Diagnosis not present

## 2023-11-29 DIAGNOSIS — I10 Essential (primary) hypertension: Secondary | ICD-10-CM | POA: Diagnosis not present

## 2023-11-29 LAB — CBC
HCT: 28.7 % — ABNORMAL LOW (ref 39.0–52.0)
Hemoglobin: 9.1 g/dL — ABNORMAL LOW (ref 13.0–17.0)
MCH: 27.3 pg (ref 26.0–34.0)
MCHC: 31.7 g/dL (ref 30.0–36.0)
MCV: 86.2 fL (ref 80.0–100.0)
Platelets: 471 K/uL — ABNORMAL HIGH (ref 150–400)
RBC: 3.33 MIL/uL — ABNORMAL LOW (ref 4.22–5.81)
RDW: 16.5 % — ABNORMAL HIGH (ref 11.5–15.5)
WBC: 12.2 K/uL — ABNORMAL HIGH (ref 4.0–10.5)
nRBC: 0 % (ref 0.0–0.2)

## 2023-11-29 LAB — RENAL FUNCTION PANEL
Albumin: 1.8 g/dL — ABNORMAL LOW (ref 3.5–5.0)
Anion gap: 14 (ref 5–15)
BUN: 76 mg/dL — ABNORMAL HIGH (ref 6–20)
CO2: 23 mmol/L (ref 22–32)
Calcium: 8.7 mg/dL — ABNORMAL LOW (ref 8.9–10.3)
Chloride: 92 mmol/L — ABNORMAL LOW (ref 98–111)
Creatinine, Ser: 22.15 mg/dL — ABNORMAL HIGH (ref 0.61–1.24)
GFR, Estimated: 2 mL/min — ABNORMAL LOW (ref >60)
Glucose, Bld: 104 mg/dL — ABNORMAL HIGH (ref 70–99)
Phosphorus: 4.8 mg/dL — ABNORMAL HIGH (ref 2.5–4.6)
Potassium: 3.6 mmol/L (ref 3.5–5.1)
Sodium: 129 mmol/L — ABNORMAL LOW (ref 135–145)

## 2023-11-29 LAB — VANCOMYCIN, RANDOM: Vancomycin Rm: 26 ug/mL

## 2023-11-29 MED ORDER — SODIUM CHLORIDE 0.9 % IV SOLN
2.0000 g | INTRAVENOUS | Status: DC
  Administered 2023-11-29: 2 g via INTRAVENOUS
  Filled 2023-11-29: qty 20

## 2023-11-29 NOTE — TOC CM/SW Note (Addendum)
 Transition of Care [facility]) - Inpatient Brief Assessment   Patient Details  Name: Micheal Bean MRN: [id number] Date of Birth: 06/19/1985  Transition of Care [facility]) CM/SW Contact:    Lauraine FORBES Saa, LCSWA Phone Number: 11/29/2023, 2:30 PM   Clinical Narrative:  2:30 PM Per chart review, patient resides at home with parent(s). Patient has a PCP and insurance. Patient does not have SNF or HH history. Patient has DME (crutches) history. Patient's preferred pharmacy's are [facility], [facility], [facility] [address], and [phone] [place]. No TOC needs were identified at this time. TOC will continue to follow and be available to assist.  Transition of Care Asessment: Insurance and Status: Insurance coverage has been reviewed Patient has primary care physician: Yes Home environment has been reviewed: Private Residence Prior level of function:: N/A Prior/Current Home Services: No current home services Social Drivers of Health Review: SDOH reviewed no interventions necessary Readmission risk has been reviewed: Yes (Currently Yellow 20%) Transition of care needs: no transition of care needs at this time

## 2023-11-29 NOTE — Progress Notes (Signed)
 D/C order noted. Contacted [facility] home therapy staff to be advised of pt's d/c today.   Randine Mungo Dialysis Navigator [phone]

## 2023-11-29 NOTE — Progress Notes (Signed)
  [facility] Progress Note   Subjective: Seen in room. PD catheter replaced in OR 11/27/23. Completed PD overnight - no issues with catheter. Tolerated well. He reports abdominal tenderness this morning. [birth year]. He reports that he is due for Mircera. Was getting lower fills during PD. Possible d/c today? Objective Vitals:   11/28/23 2028 11/28/23 2300 11/29/23 0421 11/29/23 0800  BP: (!) 166/97 (!) 150/97 (!) 140/78 (!) 152/95  Pulse: 94 98 100 91  Resp: 20 18 20 15   Temp: 98 F (36.7 C) 98.2 F (36.8 C) 98.2 F (36.8 C) 98.3 F (36.8 C)  TempSrc: Oral Oral Oral Oral  SpO2: 97% 95% 98% 100%  Weight:      Height:         Additional Objective Labs: Basic Metabolic Panel: Recent Labs  Lab 11/27/23 0353 11/28/23 0345 11/29/23 0451  NA 135 128* 129*  K 4.5 4.5 3.6  CL 102 94* 92*  CO2 19* 19* 23  GLUCOSE 76 198* 104*  BUN 80* 82* 76*  CREATININE 24.52* 23.93* 22.15*  CALCIUM  8.5* 8.8* 8.7*  PHOS 4.0 3.3 4.8*   CBC: Recent Labs  Lab 11/26/23 0103 11/28/23 0345  WBC 7.0 12.0*  HGB 10.0* 9.1*  HCT 32.6* 29.1*  MCV 90.3 87.7  PLT 326 408*     Physical Exam General: Alert, nad  Heart: RRR Resp:  Normal wob Abdomen: tender this morning Extremities: no LE edema  Dialysis Access: PD cath in place   Medications:  dialysis solution 2.5% low-MG/low-CA      amLODipine   10 mg Oral Daily   calcitRIOL   0.5 mcg Oral Daily   calcium  acetate  1,334 mg Oral TID with meals   gentamicin  cream  1 Application Topical Daily   heparin   5,000 Units Subcutaneous Q8H   multivitamin  1 tablet Oral Q breakfast   sevelamer  carbonate  3,200 mg Oral TID WC   sodium chloride  flush  3 mL Intravenous Q12H   sucroferric oxyhydroxide  1,000 mg Oral TID with meals   Tenapanor  HCl (CKD)  30 mg Oral Daily   vancomycin  variable dose per unstable renal function (pharmacist dosing)   Does not apply See admin instructions    Dialysis Orders:  CCPD  6  exchanges  Fill vol 2800 ml  Dwell time 1 hr  1 daytime fill 2000 ml  EDW 73.5kg   Assessment/Plan: Malfunctioning peritoneal dialysis catheter: Has not been working since Wednesday. Unable to push or drain fluid.  Tried lactulose without effect.  Denies history of constipation.  On peritoneal dialysis for 4 years.  VVS consulting.  KUB shows PD cath in the lower abdomen. PD catheter replaced 8/24. Appreciate VVS assistance. Cath tip culture sent - pending.  ESRD: Continue CCPD nightly. No issues with new PD catheter last night. HTN: BP acceptable. Continue home meds.  Volume: is up 8kg by wts, follow.  Anemia of esrd: Hb 9-10 no esa needs. Due for Mircera in OP setting. Will follow up with [facility] and let them know to schedule this in OP setting. Follow. BMD: patient is currently on his home dose of Renvela . Tenapanor  was also ordered by admitting team. He has never taken this before. I d/c'd this order. Dispo: possible d/c today?    Belvie Och, NP [facility] 11/29/2023,9:49 AM

## 2023-11-29 NOTE — Progress Notes (Signed)
   11/29/23 0730  Peritoneal Catheter Left lower abdomen Continuous ambulatory  Placement Date/Time: 11/27/23 1140   Serial / Lot #: [id number]  Expiration Date: 02/02/28  Procedural Verification: Site marked with initials;Medical records & consent reviewed;Relevant studies,results and images reviewed  Time out: Correct Patient;Corre...  Drainage Description None  Catheter status Accessed;Deaccessed  Dressing Gauze/Drain sponge  Dressing Status Clean, Dry, Intact  Dressing Intervention Assessed, no intervention needed  Completion  Treatment Status Complete  Initial Drain Volume 2  Average Dwell Time-Hour(s) 1  Average Dwell Time-Min(s) 30  Average Drain Time 13  Total Therapy Volume 5998  Total Therapy Time-Hour(s) 10  Total Therapy Time-Min(s) 48  Effluent Appearance Amber;Clear  Fluid Balance - CCPD  Total UF (+ value on cycler, pt loss) 108 mL  Procedure Comments  Tolerated treatment well? Yes  Peritoneal Dialysis Comments Patient stated there were no issues during treatment last night. The final drain  Hand-off documentation  Hand-off Given Given to shift RN/LPN  Report given to (Full Name) Dorthea, RN  Report received from (Full Name) Berwyn Piety, RN   Patient in good spirits this morning.

## 2023-11-29 NOTE — Discharge Summary (Signed)
 Physician Discharge Summary   Patient: Micheal Bean MRN: [id number] DOB: Dec 18, 1985  Admit date:     11/26/2023  Discharge date: 11/29/23  Discharge Physician: Elidia Sieving Aalliyah Kilker   PCP: Tanda Bleacher, MD   Recommendations at discharge:    A new peritoneal dialysis catheter has been placed successfully.   Continue PD at home, with lower fill volume for the next 1 to 2 weeks to allow proper healing to take place before resuming normal fill volumes.   Discharge Diagnoses: Principal Problem:   Peritoneal dialysis catheter dysfunction [facility]) Active Problems:   End-stage renal disease on peritoneal dialysis [facility])   Essential hypertension   Renal failure  Resolved Problems:   * No resolved hospital problems. Micheal Bean Hospital Course: Mr. [name] was admitted to peritoneal dialysis catheter malfunctioning   38 yo male with ESRD and has been on peritoneal dialysis over the last 4 years. Unfortunately his catheter is malfunctioning, not able to drain dialysis fluid. He called his nephrologist and he was referred to the ED. In the past he was on HD, using a right wrist fistula that now is non functioning. He dose not make urine. At the time of admission  BP (!) 152/94   Pulse (!) 105   Temp 98.7 F (37.1 C)   Resp 20   Ht 6' 3 (1.905 m)   Wt 81.6 kg   SpO2 98%   BMI 22.49 kg/m  Cardiovascular with S1 and S2 present and regular, no rubs, or murmurs Respiratory with no wheezing or rhonchi  Abdomen with no distention, non tender, soft to palpation, PD catheter in place, with dressing no apparent erythema  No lower extremity edema   Na 136, K 4.2 CL 102 bicarbonate 20 glucose 90 bun 70 cr 23.2  Wbc 7.0 hgb 10.0 plt 326    Chest radiograph with hypoinflation, mild cardiomegaly and hilar vascular congestion, no infiltrates and no effusions, positive air under bilateral diaphragms.  Abdominal radiograph with peritoneal catheter in place.   Vascular surgery consulted for HD access.   08/24 replaced peritoneal HD catheter and resume dialysis.  08/25 tolerated well peritoneal dialysis 08/26 continue to improve, plan to continue with PD at home.    Assessment and Plan: * Peritoneal dialysis catheter dysfunction (HCC) 08/24 removal of  peritoneal dialysis catheter and laparoscopic placement of new peritoneal dialysis catheter.   Placed on prophylactic antibiotic therapy with IV vancomycin .  Tip culture positive for staphylococcus epidermidis and few gram negative rods. Rare wbc.  Discussed with nephrology, and likely contamination.  Antibiotics were stopped.   End-stage renal disease on peritoneal dialysis (HCC) Hyponatremia.  Resume PD with good toleration.  Uremic symptoms have resolved.   Today BUN is 76 down from 82, K is 3,6 and bicarbonate at 23  Na 129 P 4,8   Continue PD per nephrology recommendations, low fill volume to allow propter healing of the catheter site.   Anemia of chronic renal disease, with iron deficiency, continue with iron supplementation.   Metabolic bone disease continue with sevelamer  and phoslo .  Calcitriol .  Essential hypertension Continue blood pressure control with amlodipine  and ARB will be resumed at the time of discharge.          Consultants: nephrology and vascular surgery  Procedures performed: PD catheter replacement   Disposition: Home Diet recommendation:  Renal diet DISCHARGE MEDICATION: Allergies as of 11/29/2023   No Known Allergies      Medication List     TAKE these medications  amLODipine -valsartan  10-160 MG tablet Commonly known as: EXFORGE  Take 1 tablet by mouth at bedtime.   calcitRIOL  0.5 MCG capsule Commonly known as: ROCALTROL  Take 4 capsules (2 mcg total) by mouth 2 (two) times daily. Take inbetween meals What changed:  how much to take when to take this additional instructions   Calcium  Acetate 667 MG Tabs Take 667-1,334 mg by mouth See admin instructions. Take 2 tablets three  times a day with each meal. Take 1 tablet with each snack.   ELDERBERRY IMMUNE HEALTH GUMMY PO Take 1 each by mouth daily.   gentamicin  cream 0.1 % Commonly known as: GARAMYCIN  Apply 1 Application topically daily.   multivitamin Tabs tablet TAKE 1 TABLET BY MOUTH EVERYDAY AT BEDTIME What changed: See the new instructions.   sevelamer  carbonate 800 MG tablet Commonly known as: RENVELA  Take 3,200 mg by mouth 3 (three) times daily with meals.   Velphoro  500 MG chewable tablet Generic drug: sucroferric oxyhydroxide Chew 1,000 mg by mouth 3 (three) times daily.   Xphozah  30 MG Tabs Generic drug: Tenapanor  HCl (CKD) Take 30 mg by mouth daily.        Follow-up Information     [facility] at [facility] Follow up.   Specialty: Vascular Surgery Why: As needed Contact information: [address], [address] [place]  [address] [phone]               Discharge Exam: [name]   11/26/23 0054  Weight: 81.6 kg   BP (!) 152/95 (BP Location: Right Arm)   Pulse 91   Temp 98.3 F (36.8 C) (Oral)   Resp 15   Ht 6' 3 (1.905 m)   Wt 81.6 kg   SpO2 100%   BMI 22.49 kg/m   Patient is feeling well, no chest pain and no dyspnea, no edema, PND or orthopnea  Neurology awake and alert ENT with mild pallor Cardiovascular with S1 and S2 present and regular with no gallops, rubs or murmurs Respiratory with no rales or wheezing, no rhonchi  Abdomen is soft and non tender, not distended, PD catheter in place No lower extremity edema   Condition at discharge: stable  The results of significant diagnostics from this hospitalization (including imaging, microbiology, ancillary and laboratory) are listed below for reference.   Imaging Studies: DG Abd 1 View Result Date: 11/26/2023 EXAM: 1 VIEW XRAY OF THE ABDOMEN 11/26/2023 08:38:00 AM COMPARISON: 11/21/2020 CLINICAL HISTORY: Peritoneal dialysis catheter  dysfunction. FINDINGS: BOWEL: Nonobstructive bowel gas pattern. SOFT TISSUES: No opaque urinary calculi. Peritoneal dialysis catheter projects over the lower peritoneal cavity. Cholecystectomy clips. BONES: No acute osseous abnormality. Arteriovascular calcifications. IMPRESSION: 1. Peritoneal dialysis catheter projects over the lower peritoneal cavity. Electronically signed by: Katheleen Faes MD 11/26/2023 01:16 PM EDT RP Workstation: [id number]   DG Chest Portable 1 View Result Date: 11/26/2023 CLINICAL DATA:  Nondraining peritoneal dialysis catheter EXAM: PORTABLE CHEST 1 VIEW COMPARISON:  05/12/2023 FINDINGS: Cardiac shadow is within normal limits. Lungs are clear bilaterally. Free intraperitoneal air is noted beneath the hemidiaphragms bilaterally likely related to the known peritoneal dialysis catheter. No bony abnormality is noted. IMPRESSION: No acute abnormality in the chest. Free air underneath hemidiaphragms likely related to the known peritoneal dialysis catheter. Electronically Signed   By: Oneil Devonshire M.D.   On: 11/26/2023 03:08    Microbiology: Results for orders placed or performed during the hospital encounter of 11/26/23  Aerobic/Anaerobic Culture w  Gram Stain (surgical/deep wound)     Status: None (Preliminary result)   Collection Time: 11/27/23 11:31 AM   Specimen: Path Tissue  Result Value Ref Range Status   Specimen Description TISSUE  Final   Special Requests PORTION OF PERITONEAL DIALYSIS CATH  Final   Gram Stain   Final    RARE WBC SEEN RARE GRAM POSITIVE COCCI Performed at [facility], [address] [place], [place] [address]    Culture   Final    FEW GRAM NEGATIVE RODS FEW STAPHYLOCOCCUS EPIDERMIDIS SUSCEPTIBILITIES TO FOLLOW NO ANAEROBES ISOLATED; CULTURE IN PROGRESS FOR 5 DAYS    Report Status PENDING  Incomplete    Labs: CBC: Recent Labs  Lab 11/26/23 0103 11/28/23 0345  WBC 7.0 12.0*  HGB 10.0* 9.1*  HCT 32.6* 29.1*  MCV 90.3 87.7  PLT 326  408*   Basic Metabolic Panel: Recent Labs  Lab 11/26/23 0103 11/27/23 0353 11/28/23 0345 11/29/23 0451  NA 136 135 128* 129*  K 4.2 4.5 4.5 3.6  CL 102 102 94* 92*  CO2 20* 19* 19* 23  GLUCOSE 90 76 198* 104*  BUN 70* 80* 82* 76*  CREATININE 23.20* 24.52* 23.93* 22.15*  CALCIUM  9.1 8.5* 8.8* 8.7*  PHOS  --  4.0 3.3 4.8*   Liver Function Tests: Recent Labs  Lab 11/26/23 0103 11/27/23 0353 11/28/23 0345 11/29/23 0451  AST 13*  --   --   --   ALT 11  --   --   --   ALKPHOS 73  --   --   --   BILITOT 0.5  --   --   --   PROT 6.5  --   --   --   ALBUMIN  2.5* 1.9* 1.9* 1.8*   CBG: No results for input(s): GLUCAP in the last 168 hours.  Discharge time spent: greater than 30 minutes.  Signed: Elidia Toribio Furnace, MD [facility] 11/29/2023

## 2023-11-29 NOTE — Progress Notes (Signed)
 Pharmacy Antibiotic Note  Micheal Bean is a 38 y.o. male admitted on 11/26/2023 with PD catheter malfunction.  Pharmacy has been consulted for vancomycin  dosing. Preliminary peritoneal cath culture with GPCs (gram stain) and GNRs (culture).   Vancomycin  random level remains therapeutic 26. Repeat level in 2-3 days before redosing.   Plan: Loading dose vancomycin  1750 mg IV x1 given 8/23 Ceftriaxone  2g IV every day per MD  Re-dose with 15 mg/kg when levels reach <20 mcg/dL  Trend WBC, fever, renal function F/u cultures, clinical progress, levels as indicated De-escalate when able, f/u discharge plans   Height: 6' 3 (190.5 cm) Weight: 81.6 kg (179 lb 14.3 oz) IBW/kg (Calculated) : 84.5  Temp (24hrs), Avg:98.1 F (36.7 C), Min:97.8 F (36.6 C), Max:98.3 F (36.8 C)  Recent Labs  Lab 11/26/23 0103 11/27/23 0353 11/28/23 0345 11/29/23 0451  WBC 7.0  --  12.0*  --   CREATININE 23.20* 24.52* 23.93* 22.15*  VANCORANDOM  --   --   --  26    Estimated Creatinine Clearance: 5.3 mL/min (A) (by C-G formula based on SCr of 22.15 mg/dL (H)).    No Known Allergies  Antimicrobials this admission: Vancomycin  8/23 >> Ceftriaxone  8/26 >>  Thank you for allowing pharmacy to be a part of this patient's care.  Shelba Collier, PharmD, BCPS Clinical Pharmacist

## 2023-11-29 NOTE — Progress Notes (Addendum)
 DISCHARGE NOTE HOME Micheal Bean to be discharged Home per MD order. Discussed prescriptions and follow up appointments with the patient. Prescriptions given to patient; medication list explained in detail. Patient verbalized understanding.  Skin clean, dry and intact without evidence of skin break down, no evidence of skin tears noted. IV catheter discontinued intact. Site without signs and symptoms of complications. Dressing and pressure applied. Pt denies pain at the site currently. No complaints noted.  See LDA for incision , lines and wounds. PD cath placement this admission   An After Visit Summary (AVS) was printed and given to the patient. Patient escorted via wheelchair, and discharged home via private auto.  [name], RN

## 2023-11-29 NOTE — Progress Notes (Signed)
 Called and gave report to the Nurse on [address] regarding patient. Patient will go to bed 13 by wheelchair. He has all of his belongings and is ready to be transferred.

## 2023-11-30 ENCOUNTER — Telehealth: Payer: Self-pay | Admitting: *Deleted

## 2023-11-30 NOTE — Transitions of Care (Post Inpatient/ED Visit) (Signed)
   11/30/2023  Name: ICKER SWIGERT MRN: [id number] DOB: 1985-11-14  Today's TOC FU Call Status: Today's TOC FU Call Status:: Unsuccessful Call (1st Attempt) Unsuccessful Call (1st Attempt) Date: 11/30/23  Attempted to reach the patient regarding the most recent Inpatient/ED visit.  Follow Up Plan: Additional outreach attempts will be made to reach the patient to complete the Transitions of Care (Post Inpatient/ED visit) call.   Andrea Dimes RN, BSN [place]  [facility] RN Care Manager [phone]

## 2023-12-01 ENCOUNTER — Telehealth: Payer: Self-pay

## 2023-12-01 NOTE — Transitions of Care (Post Inpatient/ED Visit) (Signed)
   12/01/2023  Name: MANPREET STREY MRN: [id number] DOB: 1985/08/02  Today's TOC FU Call Status: Today's TOC FU Call Status:: Successful TOC FU Call Completed TOC FU Call Complete Date: 12/01/23 Patient's Name and Date of Birth confirmed.  Transition Care Management Follow-up Telephone Call How have you been since you were released from the hospital?: Better Any questions or concerns?: No  Placed call to patient and explained reason for call. Patient declined review of instructions, medications and follow ups. Reports that he understands what to do.  Reports his PCP retired. States he will get a new PCP next week. Declined needing help finding PCP.  TOC call and assessments not complete per patient declined. Provided my contact information if patient has any questions and he needs to call me backs.  Alan Ee, RN, BSN, CEN [employer]- Transition of Care Team.  [facility] [phone]

## 2023-12-02 LAB — AEROBIC/ANAEROBIC CULTURE W GRAM STAIN (SURGICAL/DEEP WOUND)

## 2023-12-04 ENCOUNTER — Other Ambulatory Visit: Payer: Self-pay

## 2023-12-04 ENCOUNTER — Emergency Department (HOSPITAL_COMMUNITY)

## 2023-12-04 ENCOUNTER — Encounter (HOSPITAL_COMMUNITY): Payer: Self-pay

## 2023-12-04 ENCOUNTER — Emergency Department (HOSPITAL_COMMUNITY)
Admission: EM | Admit: 2023-12-04 | Discharge: 2023-12-04 | Disposition: A | Discharge status: Home / Self Care | Attending: Emergency Medicine | Admitting: Emergency Medicine

## 2023-12-04 DIAGNOSIS — Y841 Kidney dialysis as the cause of abnormal reaction of the patient, or of later complication, without mention of misadventure at the time of the procedure: Secondary | ICD-10-CM | POA: Diagnosis not present

## 2023-12-04 DIAGNOSIS — T8089XA Other complications following infusion, transfusion and therapeutic injection, initial encounter: Secondary | ICD-10-CM | POA: Diagnosis present

## 2023-12-04 DIAGNOSIS — T8090XA Unspecified complication following infusion and therapeutic injection, initial encounter: Secondary | ICD-10-CM

## 2023-12-04 LAB — BASIC METABOLIC PANEL WITH GFR
Anion gap: 16 — ABNORMAL HIGH (ref 5–15)
BUN: 62 mg/dL — ABNORMAL HIGH (ref 6–20)
CO2: 20 mmol/L — ABNORMAL LOW (ref 22–32)
Calcium: 8.3 mg/dL — ABNORMAL LOW (ref 8.9–10.3)
Chloride: 89 mmol/L — ABNORMAL LOW (ref 98–111)
Creatinine, Ser: 20.65 mg/dL — ABNORMAL HIGH (ref 0.61–1.24)
GFR, Estimated: 3 mL/min — ABNORMAL LOW (ref >60)
Glucose, Bld: 99 mg/dL (ref 70–99)
Potassium: 4.3 mmol/L (ref 3.5–5.1)
Sodium: 125 mmol/L — ABNORMAL LOW (ref 135–145)

## 2023-12-04 LAB — CBC
HCT: 30.3 % — ABNORMAL LOW (ref 39.0–52.0)
Hemoglobin: 9.6 g/dL — ABNORMAL LOW (ref 13.0–17.0)
MCH: 27.4 pg (ref 26.0–34.0)
MCHC: 31.7 g/dL (ref 30.0–36.0)
MCV: 86.3 fL (ref 80.0–100.0)
Platelets: 600 K/uL — ABNORMAL HIGH (ref 150–400)
RBC: 3.51 MIL/uL — ABNORMAL LOW (ref 4.22–5.81)
RDW: 17.5 % — ABNORMAL HIGH (ref 11.5–15.5)
WBC: 8.7 K/uL (ref 4.0–10.5)
nRBC: 0 % (ref 0.0–0.2)

## 2023-12-04 LAB — TROPONIN I (HIGH SENSITIVITY)
Troponin I (High Sensitivity): 17 ng/L (ref <18)
Troponin I (High Sensitivity): 19 ng/L — ABNORMAL HIGH (ref <18)

## 2023-12-04 MED ORDER — SODIUM CHLORIDE 0.9 % IV BOLUS
500.0000 mL | Freq: Once | INTRAVENOUS | Status: AC
  Administered 2023-12-04: 500 mL via INTRAVENOUS

## 2023-12-04 NOTE — ED Notes (Signed)
 Patient transported to X-ray

## 2023-12-04 NOTE — Progress Notes (Signed)
 Called by ED to evaluate pt's PD catheter. Pt reports only drained ~ 80mL overnight. Tried manual drain but only ~71mL more. Cycler did not alarm last night, completed 6 exchanges. He still feels like has fluid in abdomen. Zero abd tenderness, no fever. Pt also has noticed penile edema this AM. Had good BM this AM. He is anuric at baseline. S/p recent PD cath revision but has been getting good UF 1.8-2L daily since the surgery (doing low fill volumes at home - 1.2L, 6 exchanges).  Will get KUB to eval catheter position. Suspect cath up against bowel wall. Encouraged BM and movement. Will have PD nurse evaluate cath with flush and manual drain but given it is a Sunday will be later today. If flushes ok, can likely d/c home and use 4.25% dextrose  for more UF.  Penile edema very mild today - unclear etiology but doubt related to peritoneal leak.  Izetta Boehringer, PA-C [employer] Pager [phone]

## 2023-12-04 NOTE — Discharge Instructions (Signed)
Please return for new or worsening symptoms.

## 2023-12-04 NOTE — ED Provider Notes (Signed)
 Patient signed out to me pending dialysis RN inspection of dialysis catheter.  If looks good on their end, then patient can go home with outpatient followup.  I consulted with Dr. Dolan, who states that the dialysis nurse is aware of the situation and will be by as soon as available.  8:48 PM Dialysis nurse has checked the catheter, it is functioning normally and draining proficiently.  Patient is ready to be discharged.   Vicky Charleston, PA-C 12/04/23 2049    Elnor Jayson LABOR, DO 12/05/23 [phone]

## 2023-12-04 NOTE — ED Provider Notes (Signed)
 [place] EMERGENCY DEPARTMENT AT [facility] Provider Note   CSN: [id number] Arrival date & time: 12/04/23  9240     Patient presents with: Chest Pain   Micheal Bean is a 38 y.o. male.   Patient to ED for evaluation of chest tightness when he takes a deep breath, and penile swelling. He reports abnormal peritoneal dialysis described as significantly less fluid discharged than his usual. He states he had a recent admission for PD dialysis catheter exchange due to failure and has been doing well until last night. He notes the dialysis treatment completed last night, unlike last week when it did not advance past the first cycle. No abdominal pain, fever, cough.  The history is provided by the patient. No language interpreter was used.  Chest Pain      Prior to Admission medications   Medication Sig Start Date End Date Taking? Authorizing Provider  amLODipine -valsartan  (EXFORGE ) 10-160 MG tablet Take 1 tablet by mouth at bedtime. 11/20/23   [provider]  calcitRIOL  (ROCALTROL ) 0.5 MCG capsule Take 4 capsules (2 mcg total) by mouth 2 (two) times daily. Take inbetween meals Patient taking differently: Take 0.5 mcg by mouth daily. 06/11/22   Raenelle Donalda HERO, MD  Calcium  Acetate 667 MG TABS Take 667-1,334 mg by mouth See admin instructions. Take 2 tablets three times a day with each meal. Take 1 tablet with each snack.    [provider]  Elderberry-Vitamin C-Zinc (ELDERBERRY IMMUNE HEALTH GUMMY PO) Take 1 each by mouth daily.    [provider]  gentamicin  cream (GARAMYCIN ) 0.1 % Apply 1 Application topically daily. 05/21/21   [provider]  multivitamin (RENA-VIT) TABS tablet TAKE 1 TABLET BY MOUTH EVERYDAY AT BEDTIME Patient taking differently: Take 1 tablet by mouth daily with breakfast. 06/08/21   Tanda Bleacher, MD  sevelamer  carbonate (RENVELA ) 800 MG tablet Take 3,200 mg by mouth 3 (three) times daily with meals. 08/03/23   [provider]  Tenapanor  HCl, CKD, (XPHOZAH ) 30 MG TABS Take 30 mg by mouth daily.    [provider]  VELPHORO  500 MG chewable tablet Chew 1,000 mg by mouth 3 (three) times daily. 11/07/23   [provider]    Allergies: Patient has no known allergies.    Review of Systems  Cardiovascular:  Positive for chest pain.    Updated Vital Signs BP (!) 153/97   Pulse 95   Temp 98 F (36.7 C) (Oral)   Resp 18   Ht 6' 3 (1.905 m)   Wt 79.4 kg   SpO2 100%   BMI 21.87 kg/m   Physical Exam Vitals and nursing note reviewed.  Constitutional:      General: He is not in acute distress.    Appearance: He is well-developed.  HENT:     Head: Normocephalic.  Cardiovascular:     Rate and Rhythm: Normal rate and regular rhythm.     Heart sounds: No murmur heard. Pulmonary:     Effort: Pulmonary effort is normal.     Breath sounds: Rales (Mild bibasilar rales.) present. No wheezing or rhonchi.  Abdominal:     General: Bowel sounds are normal.     Palpations: Abdomen is soft.     Tenderness: There is no abdominal tenderness. There is no guarding or rebound.  Genitourinary:    Comments: Edema to penile shaft. No scrotal swelling. No redness. Head of penis normal in appearance.  Musculoskeletal:  General: Normal range of motion.     Cervical back: Normal range of motion and neck supple.     Right lower leg: No edema.     Left lower leg: No edema.  Skin:    General: Skin is warm and dry.  Neurological:     General: No focal deficit present.     Mental Status: He is alert and oriented to person, place, and time.     (all labs ordered are listed, but only abnormal results are displayed) Labs Reviewed  BASIC METABOLIC PANEL WITH GFR - Abnormal; Notable for the following components:      Result Value   Sodium 125 (*)    Chloride 89 (*)    CO2 20 (*)    BUN 62 (*)    Creatinine, Ser 20.65 (*)    Calcium  8.3 (*)    GFR, Estimated 3 (*)    Anion gap 16 (*)     All other components within normal limits  CBC - Abnormal; Notable for the following components:   RBC 3.51 (*)    Hemoglobin 9.6 (*)    HCT 30.3 (*)    RDW 17.5 (*)    Platelets 600 (*)    All other components within normal limits  TROPONIN I (HIGH SENSITIVITY) - Abnormal; Notable for the following components:   Troponin I (High Sensitivity) 19 (*)    All other components within normal limits  TROPONIN I (HIGH SENSITIVITY)    EKG: EKG Interpretation Date/Time:  Sunday December 04 2023 08:15:14 EDT Ventricular Rate:  82 PR Interval:  188 QRS Duration:  86 QT Interval:  422 QTC Calculation: 493 R Axis:   50  Text Interpretation: Normal sinus rhythm Septal infarct , age undetermined Abnormal ECG When compared with ECG of 12-May-2023 10:04, PREVIOUS ECG IS PRESENT Confirmed by Elnor Savant ([id number]) on 12/04/2023 12:41:12 PM  Radiology: ARCOLA Abd 1 View Result Date: 12/04/2023 EXAM: 1 VIEW XRAY OF THE ABDOMEN 12/04/2023 12:54:00 PM COMPARISON: 11/26/2023 abdominal radiograph. CLINICAL HISTORY: PD catheter dysfunction. FINDINGS: BOWEL: Nonobstructive bowel gas pattern. SOFT TISSUES: No opaque urinary calculi. Peritoneal dialysis catheter overlies the left mid abdomen with tip coiled within the left pelvis with no appreciable kink or discontinuity. Surgical clips in the right upper quadrant. BONES: No acute osseous abnormality. IMPRESSION: 1. Peritoneal dialysis catheter tip coiled within the left pelvis with no appreciable kink or discontinuity. 2. Nonobstructive bowel gas pattern. Electronically signed by: Selinda Blue MD 12/04/2023 01:30 PM EDT RP Workstation: [id number]   DG Chest 2 View Result Date: 12/04/2023 CLINICAL DATA:  38 year old male with chest pain, tightness, shortness of breath. Peritoneal dialysis patient. EXAM: CHEST - 2 VIEW COMPARISON:  Portable chest 11/26/2023 and earlier. FINDINGS: PA and lateral views 0820 hours. Pneumoperitoneum under the diaphragm in keeping with history of  peritoneal dialysis, visible since February this year. But left pleural effusion and patchy left lung base opacity are new since 11/26/2023. Normal cardiac size and mediastinal contours. Visualized tracheal air column is within normal limits. No pneumothorax. No pulmonary edema. Right lung base appears stable and negative. 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 No acute osseous abnormality identified. Stable right upper quadrant surgical clips. Negative visible bowel gas. IMPRESSION: 1. New left lung base opacity and pleural effusion since 11/26/2023, nonspecific. Query signs/symptoms of bronchopneumonia, PE. 2. Pneumoperitoneum redemonstrated in conjunction with peritoneal dialysis. Electronically Signed   By: VEAR Hurst M.D.   On: 12/04/2023 08:45     Procedures   Medications Ordered in the  ED  sodium chloride  0.9 % bolus 500 mL (500 mLs Intravenous New Bag/Given 12/04/23 1329)    Clinical Course as of 12/04/23 1417  Sun Dec 04, 2023  9056 Patient to ED with concern for PD function. Also has swelling of his penis and chest tightness. No fever. No abdominal pain.   Labs at baseline for this patient. Discussed with nephrology who will review the chart and call back to discuss plan of care.  [SU]  1312 The patient has been seen by nephrology PA Stovall. A KUB has been ordered. Plan to have PD nurse come to evaluate the catheter patency but won't be on campus until 5:00 or 6:00 pm, per nephrology PA Stovall. KUB unremarkable. Patient care will be signed out to oncoming provider team to await evaluation by PD nurse as there is no other alternative. Anticipate re-consultation with nephrology for disposition plan when catheter has been evaluated. Per nephrology, patient is aware of timeline of care. Meal ordered for the patient.  [SU]    Clinical Course User Index [SU] Odell Balls, PA-C                                 Medical Decision Making Amount and/or Complexity of Data Reviewed Labs:  ordered. Radiology: ordered.        Final diagnoses:  Complication of peritoneal dialysis, initial encounter    ED Discharge Orders     None          Odell Balls, PA-C 12/04/23 1417    Elnor Savant A, DO 12/05/23 [phone]

## 2023-12-04 NOTE — Progress Notes (Signed)
 PD catheter flushed and drained without difficulties.

## 2023-12-04 NOTE — ED Triage Notes (Signed)
 Pt states he started having chest pain and tightness this morning. Pt states his peritoneal dialysis catheter did not drain much last night. Pt states his penis is also swollen. Pt has shortness of breath and nausea. Pt denies vomiting, states he feels like he is full of fluid.

## 2024-02-15 ENCOUNTER — Other Ambulatory Visit: Payer: Self-pay

## 2024-02-15 ENCOUNTER — Inpatient Hospital Stay (HOSPITAL_COMMUNITY)
Admission: EM | Admit: 2024-02-15 | Discharge: 2024-02-18 | DRG: 919 | Disposition: A | Discharge status: Home / Self Care | Attending: Internal Medicine | Admitting: Internal Medicine

## 2024-02-15 ENCOUNTER — Emergency Department (HOSPITAL_COMMUNITY)

## 2024-02-15 DIAGNOSIS — Z992 Dependence on renal dialysis: Secondary | ICD-10-CM | POA: Diagnosis not present

## 2024-02-15 DIAGNOSIS — Y812 Prosthetic and other implants, materials and accessory general- and plastic-surgery devices associated with adverse incidents: Secondary | ICD-10-CM | POA: Diagnosis present

## 2024-02-15 DIAGNOSIS — I10 Essential (primary) hypertension: Secondary | ICD-10-CM | POA: Diagnosis not present

## 2024-02-15 DIAGNOSIS — N186 End stage renal disease: Secondary | ICD-10-CM | POA: Diagnosis present

## 2024-02-15 DIAGNOSIS — K659 Peritonitis, unspecified: Secondary | ICD-10-CM

## 2024-02-15 DIAGNOSIS — Z905 Acquired absence of kidney: Secondary | ICD-10-CM

## 2024-02-15 DIAGNOSIS — D638 Anemia in other chronic diseases classified elsewhere: Secondary | ICD-10-CM | POA: Diagnosis not present

## 2024-02-15 DIAGNOSIS — T85691A Other mechanical complication of intraperitoneal dialysis catheter, initial encounter: Principal | ICD-10-CM

## 2024-02-15 DIAGNOSIS — Y838 Other surgical procedures as the cause of abnormal reaction of the patient, or of later complication, without mention of misadventure at the time of the procedure: Secondary | ICD-10-CM | POA: Diagnosis present

## 2024-02-15 DIAGNOSIS — Z8249 Family history of ischemic heart disease and other diseases of the circulatory system: Secondary | ICD-10-CM

## 2024-02-15 DIAGNOSIS — T85611A Breakdown (mechanical) of intraperitoneal dialysis catheter, initial encounter: Secondary | ICD-10-CM | POA: Diagnosis present

## 2024-02-15 DIAGNOSIS — K652 Spontaneous bacterial peritonitis: Secondary | ICD-10-CM | POA: Diagnosis present

## 2024-02-15 DIAGNOSIS — N2581 Secondary hyperparathyroidism of renal origin: Secondary | ICD-10-CM | POA: Diagnosis present

## 2024-02-15 DIAGNOSIS — R1030 Lower abdominal pain, unspecified: Secondary | ICD-10-CM

## 2024-02-15 DIAGNOSIS — E876 Hypokalemia: Secondary | ICD-10-CM | POA: Diagnosis present

## 2024-02-15 DIAGNOSIS — K5900 Constipation, unspecified: Secondary | ICD-10-CM | POA: Diagnosis present

## 2024-02-15 DIAGNOSIS — A419 Sepsis, unspecified organism: Secondary | ICD-10-CM | POA: Diagnosis present

## 2024-02-15 DIAGNOSIS — D75839 Thrombocytosis, unspecified: Secondary | ICD-10-CM | POA: Diagnosis present

## 2024-02-15 DIAGNOSIS — I120 Hypertensive chronic kidney disease with stage 5 chronic kidney disease or end stage renal disease: Secondary | ICD-10-CM | POA: Diagnosis present

## 2024-02-15 DIAGNOSIS — Z79899 Other long term (current) drug therapy: Secondary | ICD-10-CM | POA: Diagnosis not present

## 2024-02-15 DIAGNOSIS — T8571XA Infection and inflammatory reaction due to peritoneal dialysis catheter, initial encounter: Secondary | ICD-10-CM | POA: Diagnosis present

## 2024-02-15 DIAGNOSIS — D631 Anemia in chronic kidney disease: Secondary | ICD-10-CM | POA: Diagnosis present

## 2024-02-15 DIAGNOSIS — N189 Chronic kidney disease, unspecified: Secondary | ICD-10-CM

## 2024-02-15 LAB — COMPREHENSIVE METABOLIC PANEL WITH GFR
ALT: 27 U/L (ref 0–44)
AST: 16 U/L (ref 15–41)
Albumin: 1.7 g/dL — ABNORMAL LOW (ref 3.5–5.0)
Alkaline Phosphatase: 60 U/L (ref 38–126)
Anion gap: 16 — ABNORMAL HIGH (ref 5–15)
BUN: 68 mg/dL — ABNORMAL HIGH (ref 6–20)
CO2: 24 mmol/L (ref 22–32)
Calcium: 8.1 mg/dL — ABNORMAL LOW (ref 8.9–10.3)
Chloride: 92 mmol/L — ABNORMAL LOW (ref 98–111)
Creatinine, Ser: 19.01 mg/dL — ABNORMAL HIGH (ref 0.61–1.24)
GFR, Estimated: 3 mL/min — ABNORMAL LOW (ref >60)
Glucose, Bld: 87 mg/dL (ref 70–99)
Potassium: 3.4 mmol/L — ABNORMAL LOW (ref 3.5–5.1)
Sodium: 132 mmol/L — ABNORMAL LOW (ref 135–145)
Total Bilirubin: 0.7 mg/dL (ref 0.0–1.2)
Total Protein: 5.6 g/dL — ABNORMAL LOW (ref 6.5–8.1)

## 2024-02-15 LAB — CBC WITH DIFFERENTIAL/PLATELET
Abs Immature Granulocytes: 0.11 K/uL — ABNORMAL HIGH (ref 0.00–0.07)
Basophils Absolute: 0 K/uL (ref 0.0–0.1)
Basophils Relative: 0 %
Eosinophils Absolute: 0.4 K/uL (ref 0.0–0.5)
Eosinophils Relative: 3 %
HCT: 22.6 % — ABNORMAL LOW (ref 39.0–52.0)
Hemoglobin: 7.1 g/dL — ABNORMAL LOW (ref 13.0–17.0)
Immature Granulocytes: 1 %
Lymphocytes Relative: 15 %
Lymphs Abs: 1.9 K/uL (ref 0.7–4.0)
MCH: 29.5 pg (ref 26.0–34.0)
MCHC: 31.4 g/dL (ref 30.0–36.0)
MCV: 93.8 fL (ref 80.0–100.0)
Monocytes Absolute: 0.8 K/uL (ref 0.1–1.0)
Monocytes Relative: 6 %
Neutro Abs: 9.9 K/uL — ABNORMAL HIGH (ref 1.7–7.7)
Neutrophils Relative %: 75 %
Platelets: 558 K/uL — ABNORMAL HIGH (ref 150–400)
RBC: 2.41 MIL/uL — ABNORMAL LOW (ref 4.22–5.81)
RDW: 13.7 % (ref 11.5–15.5)
WBC: 13 K/uL — ABNORMAL HIGH (ref 4.0–10.5)
nRBC: 0 % (ref 0.0–0.2)

## 2024-02-15 LAB — PREPARE RBC (CROSSMATCH)

## 2024-02-15 LAB — LIPASE, BLOOD: Lipase: 66 U/L — ABNORMAL HIGH (ref 11–51)

## 2024-02-15 LAB — LACTIC ACID, PLASMA: Lactic Acid, Venous: 0.5 mmol/L (ref 0.5–1.9)

## 2024-02-15 MED ORDER — DELFLEX-LC/1.5% DEXTROSE 344 MOSM/L IP SOLN: INTRAPERITONEAL | Status: DC

## 2024-02-15 MED ORDER — TENAPANOR HCL (CKD) 30 MG PO TABS: 30.0000 mg | ORAL_TABLET | Freq: Every day | ORAL | Status: DC | PRN

## 2024-02-15 MED ORDER — GENTAMICIN SULFATE 0.1 % EX CREA
1.0000 | TOPICAL_CREAM | Freq: Every day | CUTANEOUS | Status: DC
  Administered 2024-02-15 – 2024-02-18 (×4): 1 via TOPICAL
  Filled 2024-02-15: qty 15

## 2024-02-15 MED ORDER — ACETAMINOPHEN 650 MG RE SUPP: 650.0000 mg | Freq: Four times a day (QID) | RECTAL | Status: DC | PRN

## 2024-02-15 MED ORDER — HEPARIN SODIUM (PORCINE) 5000 UNIT/ML IJ SOLN
5000.0000 [IU] | Freq: Three times a day (TID) | INTRAMUSCULAR | Status: DC
  Administered 2024-02-15: 5000 [IU] via SUBCUTANEOUS
  Filled 2024-02-15: qty 1

## 2024-02-15 MED ORDER — SUCROFERRIC OXYHYDROXIDE 500 MG PO CHEW
1000.0000 mg | CHEWABLE_TABLET | Freq: Two times a day (BID) | ORAL | Status: DC
  Administered 2024-02-15 – 2024-02-18 (×5): 1000 mg via ORAL
  Filled 2024-02-15 (×5): qty 2

## 2024-02-15 MED ORDER — SODIUM CHLORIDE 0.9 % IV SOLN: 2.0000 g | INTRAVENOUS | Status: DC

## 2024-02-15 MED ORDER — ALBUTEROL SULFATE (2.5 MG/3ML) 0.083% IN NEBU: 2.5000 mg | INHALATION_SOLUTION | Freq: Four times a day (QID) | RESPIRATORY_TRACT | Status: DC | PRN

## 2024-02-15 MED ORDER — VANCOMYCIN VARIABLE DOSE PER UNSTABLE RENAL FUNCTION (PHARMACIST DOSING): Status: DC

## 2024-02-15 MED ORDER — CALCITRIOL 0.5 MCG PO CAPS
0.5000 ug | ORAL_CAPSULE | Freq: Every day | ORAL | Status: DC
  Administered 2024-02-15 – 2024-02-18 (×4): 0.5 ug via ORAL
  Filled 2024-02-15 (×4): qty 1

## 2024-02-15 MED ORDER — AMLODIPINE BESYLATE 10 MG PO TABS
10.0000 mg | ORAL_TABLET | Freq: Every day | ORAL | Status: DC
  Administered 2024-02-16 – 2024-02-18 (×2): 10 mg via ORAL
  Filled 2024-02-15 (×3): qty 1

## 2024-02-15 MED ORDER — RENA-VITE PO TABS
1.0000 | ORAL_TABLET | Freq: Every day | ORAL | Status: DC
  Administered 2024-02-15 – 2024-02-18 (×4): 1 via ORAL
  Filled 2024-02-15 (×4): qty 1

## 2024-02-15 MED ORDER — MORPHINE SULFATE (PF) 4 MG/ML IV SOLN
4.0000 mg | Freq: Once | INTRAVENOUS | Status: AC
  Administered 2024-02-15: 4 mg via INTRAVENOUS
  Filled 2024-02-15: qty 1

## 2024-02-15 MED ORDER — DELFLEX-LC/1.5% DEXTROSE 344 MOSM/L IP SOLN: Freq: Once | INTRAPERITONEAL | Status: AC

## 2024-02-15 MED ORDER — IRBESARTAN 75 MG PO TABS
150.0000 mg | ORAL_TABLET | Freq: Every day | ORAL | Status: DC
  Administered 2024-02-16 – 2024-02-18 (×3): 150 mg via ORAL
  Filled 2024-02-15 (×3): qty 2

## 2024-02-15 MED ORDER — ALTEPLASE 2 MG IJ SOLR
4.0000 mg | Freq: Once | INTRAMUSCULAR | Status: AC
Start: 2024-02-15 — End: 2024-02-15
  Administered 2024-02-15: 4 mg
  Filled 2024-02-15: qty 4

## 2024-02-15 MED ORDER — SODIUM CHLORIDE 0.9% IV SOLUTION: Freq: Once | INTRAVENOUS | Status: AC

## 2024-02-15 MED ORDER — SODIUM CHLORIDE 0.9 % IV SOLN
1.0000 g | INTRAVENOUS | Status: DC
  Administered 2024-02-15 – 2024-02-18 (×4): 1 g via INTRAVENOUS
  Filled 2024-02-15 (×5): qty 1

## 2024-02-15 MED ORDER — AMLODIPINE BESYLATE-VALSARTAN 10-160 MG PO TABS: 1.0000 | ORAL_TABLET | Freq: Every day | ORAL | Status: DC

## 2024-02-15 MED ORDER — ALTEPLASE 2 MG IJ SOLR
INTRAMUSCULAR | Status: AC
  Filled 2024-02-15: qty 4

## 2024-02-15 MED ORDER — VANCOMYCIN HCL 1750 MG/350ML IV SOLN
1750.0000 mg | Freq: Once | INTRAVENOUS | Status: AC
  Administered 2024-02-15: 1750 mg via INTRAVENOUS
  Filled 2024-02-15: qty 350

## 2024-02-15 MED ORDER — METOPROLOL SUCCINATE ER 25 MG PO TB24
25.0000 mg | ORAL_TABLET | Freq: Every day | ORAL | Status: DC
  Administered 2024-02-16 – 2024-02-18 (×2): 25 mg via ORAL
  Filled 2024-02-15 (×3): qty 1

## 2024-02-15 MED ORDER — IOHEXOL 350 MG/ML SOLN
75.0000 mL | Freq: Once | INTRAVENOUS | Status: AC | PRN
  Administered 2024-02-15: 75 mL via INTRAVENOUS

## 2024-02-15 MED ORDER — FENTANYL CITRATE (PF) 50 MCG/ML IJ SOSY
25.0000 ug | PREFILLED_SYRINGE | INTRAMUSCULAR | Status: DC | PRN
  Administered 2024-02-17 (×5): 25 ug via INTRAVENOUS
  Filled 2024-02-15 (×6): qty 1

## 2024-02-15 MED ORDER — ACETAMINOPHEN 325 MG PO TABS
650.0000 mg | ORAL_TABLET | Freq: Four times a day (QID) | ORAL | Status: DC | PRN
  Administered 2024-02-15: 650 mg via ORAL
  Filled 2024-02-15: qty 2

## 2024-02-15 MED ORDER — HEPARIN SODIUM (PORCINE) 1000 UNIT/ML IJ SOLN
INTRAPERITONEAL | Status: DC | PRN
  Filled 2024-02-15 (×9): qty 6000

## 2024-02-15 NOTE — ED Triage Notes (Addendum)
 Patient noticed that his peritoneal dialysis catheter is draining slow this evening during treatment with LUQ pressure/pain .

## 2024-02-15 NOTE — H&P (Addendum)
 History and Physical    Patient: Micheal Bean FMW:[id number] DOB: 1985-09-25 DOA: 02/15/2024 DOS: the patient was seen and examined on 02/15/2024 PCP: Tanda Bleacher, MD  Patient coming from: Home  Chief Complaint:  Chief Complaint  Patient presents with   Peritoneal Dialysis Cath Malfunction    HPI: Micheal Bean is a 38 y.o. male with medical history significant of ESRD on peritoneal dialysis, anemia of chronic kidney disease, and secondary hyperparathyroidism who presents with left lower abdominal pain and issues with his peritoneal dialysis catheter.  He has been experiencing persistent left lower abdominal pain for the past three days, localized around his peritoneal dialysis catheter. He has been managing the pain with Tylenol .  He has been unable to perform dialysis since Monday due to fluid entering the catheter but not draining out. No fever, chills, swelling, or redness in the area of the catheter.  He experienced shortness of breath after climbing stairs, requiring rest. No cough, nausea, or vomiting.  He notes a change in stool color to darker, almost black, but denies visible blood. He has not taken any NSAIDs, only Tylenol  for pain management.  He recalls previous instances of hemoglobin drop associated with blurry vision when reading, but no cause was identified in the past. He has previously required blood transfusions when his hemoglobin was low.  He does report having some blurry vision recently.  In the emergency department patient was noted to be afebrile with pulse elevated up to 115, and all other vital signs maintained.  Labs significant for WBC 13, hemoglobin 7.1, platelets 558, sodium 132, potassium 3.4, chloride 92, CO2 24, BUN 68, creatinine 19.01, anion gap 16, albumin  1.7, and  lipase 66.  CT scan of the abdomen pelvis noted moderate to large volume free fluid in the abdomen with associated intraperitoneal gas, small bowel loops in the lower pelvis show mild  wall thickening with congested mesentery and interloop mesenteric fluid with findings concerning for possible infection.  Patient had been given morphine 4 mg IV.  Nephrology had been formally consulted and placed orders for peritoneal fluid to be cultured.  Patient was started on empiric antibiotics of vancomycin  and ceftazidime.   Review of Systems: As mentioned in the history of present illness. All other systems reviewed and are negative. Past Medical History:  Diagnosis Date   Anemia    ESRD (end stage renal disease) (HCC)    PD at home   History of blood transfusion 11/2020   4 units   History of blood transfusion    Seizure (HCC)    [facility]   Past Surgical History:  Procedure Laterality Date   AV FISTULA PLACEMENT Right 03/02/2021   Procedure: RIGHT ARM Radio-Cephalic  ARTERIOVENOUS (AV) FISTULA CREATION;  Surgeon: Lanis Fonda BRAVO, MD;  Location: [facility] OR;  Service: Vascular;  Laterality: Right;   CAPD INSERTION N/A 05/20/2021   Procedure: LAPAROSCOPIC INSERTION CONTINUOUS AMBULATORY PERITONEAL DIALYSIS  CATHETER WITH POSSIBLE OMENTOPEXY;  Surgeon: Magda Debby SAILOR, MD;  Location: [facility] OR;  Service: Vascular;  Laterality: N/A;   CAPD INSERTION N/A 11/27/2023   Procedure: LAPAROSCOPIC INSERTION CONTINUOUS AMBULATORY PERITONEAL DIALYSIS  (CAPD) CATHETER;  Surgeon: Serene Gaile ORN, MD;  Location: [facility] OR;  Service: Vascular;  Laterality: N/A;   IR FLUORO GUIDE CV LINE RIGHT  11/18/2020   IR US  GUIDE VASC ACCESS RIGHT  11/18/2020   NEPHRECTOMY     childhhod, RT side   PARATHYROIDECTOMY Left 05/31/2022   Procedure: 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 He has never used smokeless tobacco. He reports that he does not currently use alcohol. He reports that he does not currently use drugs.  No Known Allergies  Family History  Problem Relation Age of Onset   Hypertension Mother    Kidney disease Maternal Grandmother     Prior to Admission medications   Medication Sig Start Date End Date Taking? Authorizing Provider  amLODipine -valsartan  (EXFORGE ) 10-160 MG tablet Take 1 tablet by mouth at bedtime. 11/20/23   [provider]  calcitRIOL  (ROCALTROL ) 0.5 MCG capsule Take 0.5 mcg by mouth daily.    [provider]  Calcium  Acetate 667 MG TABS Take 667-1,334 mg by mouth See admin instructions. Take 2 tablets three times a day with each meal. Take 1 tablet with each snack.    [provider]  Elderberry-Vitamin C-Zinc (ELDERBERRY IMMUNE HEALTH GUMMY PO) Take 1 each by mouth daily.    [provider]  gentamicin  cream (GARAMYCIN ) 0.1 % Apply 1 Application topically daily. 05/21/21   [provider]  metoprolol succinate (TOPROL-XL) 25 MG 24 hr tablet Take 25 mg by mouth daily. 11/30/23   [provider]  multivitamin (RENA-VIT) TABS tablet TAKE 1 TABLET BY MOUTH EVERYDAY AT BEDTIME 06/08/21   Tanda Bleacher, MD  sevelamer  carbonate (RENVELA ) 800 MG tablet Take 3,200 mg by mouth 3 (three) times daily with meals. 08/03/23   [provider]  Tenapanor  HCl, CKD, (XPHOZAH ) 30 MG TABS Take 30 mg by mouth daily.    [provider]  VELPHORO  500 MG chewable tablet Chew 1,000 mg by mouth 3 (three) times daily. 11/07/23   [provider]    Physical Exam: Vitals:   02/15/24 0500 02/15/24 0700 02/15/24 1000 02/15/24 1039  BP: 124/74 121/70 120/79   Pulse: 98 97 93   Resp:  17 18   Temp:    98 F (36.7 C)  TempSrc:    Oral  SpO2: 100% 100% 100%       Constitutional: Male who appears to be in no acute distress at this time Eyes: PERRL,  lids and conjunctivae normal ENMT: Mucous membranes are moist. Normal dentition.  Neck: normal, supple  Respiratory: clear to auscultation bilaterally, no wheezing, no crackles. Normal respiratory effort. No accessory muscle use.  Cardiovascular: Regular rate and rhythm, no murmurs / rubs / gallops. No extremity edema. 2+ pedal pulses.   Abdomen: Left lower quadrant tenderness to palpation around PD catheter.  Bowel sounds present in all 4 quadrants. Musculoskeletal: no clubbing / cyanosis. No joint deformity upper and lower extremities. Good ROM, no contractures. Normal muscle tone.  Skin: no rashes, lesions, ulcers. No induration Neurologic: CN 2-12 grossly intact.  . Strength 5/5 in all 4.  Psychiatric: Normal judgment and insight. Alert and oriented x 3. Normal mood.   Data Reviewed:   Reviewed labs, imaging, and pertinent records as documented.  Assessment and Plan:  Question sepsis secondary to suspected spontaneous bacterial peritonitis Acute.  Patient presents with 3-day history of abdominal pain in the left lower quadrant near his peritoneal dialysis catheter.  Patient was noted to be tachycardic  with white blood cell count elevated at 13 to meet SIRS criteria.  CT imaging noting findings concerning for possibility of peritonitis.  Cultures obtained from dialysis catheter.  Patient had been started on empiric antibiotics of vancomycin  and ceftazidime. - Admit to a telemetry bed - Follow-up peritoneal fluid culture with Gram stain - Check lactic acid and blood culture - Continue empiric antibiotics of vancomycin  and ceftazidime - Fentanyl  IV as needed for pain  Anemia of chronic disease Acute on chronic.  Hemoglobin noted to be 7.1 which is acutely lower than prior.  Labs noted baseline appears to be around 9-10.  Patient does report having dark stools but denies any NSAID use. - Type and screen and orders placed to transfuse 1 unit of packed red blood cells - Check stool  guaiac  ESRD on HD Patient reported having issues with dialyzing from his PD catheter.  Labs noted sodium 132, potassium 3.4, BUN 68, creatinine 19.01, and anion gap 16.  Nephrology consulted for need of dialysis. - Check renal function panel daily - Appreciate nephrology consultative services, follow-up for any further recommendations  Hypokalemia Acute.  Potassium noted to be 3.4. - Continue to monitor replace as needed  Essential hypertension Pressures currently maintained. - Continue home blood pressure regimen as tolerated  Thrombocytosis Acute.  Platelet count noted to be 558. - Continue to monitor  DVT prophylaxis: SCDs Advance Care Planning:   Code Status: Full Code   Consults: Nephrology  Family Communication: none  Severity of Illness: The appropriate patient status for this patient is INPATIENT. Inpatient status is judged to be reasonable and necessary in order to provide the required intensity of service to ensure the patient's safety. The patient's presenting symptoms, physical exam findings, and initial radiographic and laboratory data in the context of their chronic comorbidities is felt to place them at high risk for further clinical deterioration. Furthermore, it is not anticipated that the patient will be medically stable for discharge from the hospital within 2 midnights of admission.   * I certify that at the point of admission it is my clinical judgment that the patient will require inpatient hospital care spanning beyond 2 midnights from the point of admission due to high intensity of service, high risk for further deterioration and high frequency of surveillance required.*  Author: Maximino DELENA Sharps, MD 02/15/2024 10:39 AM  For on call review [email or web address].

## 2024-02-15 NOTE — ED Notes (Signed)
 Dialysis nurse at bedside

## 2024-02-15 NOTE — Procedures (Signed)
 I have reviewed the PD prescription and made appropriate changes as needed.  Myer Fret MD  [facility] 02/15/2024, 5:09 PM

## 2024-02-15 NOTE — Progress Notes (Signed)
 Pharmacy Antibiotic Note  Micheal Bean is a 38 y.o. male admitted on 02/15/2024 with sepsis/peritonitis.  Pharmacy has been consulted for vancomycin  and ceftazidime dosing for peritoneal dialysis.   Plan: Ceftazidime 1g q24h IV  Vancomycin  1750 mg IV LD once.  Check random serum vanc level in 3-5 days. Re-dose with 15-20 mg/kg when level less than 20 mcg/mL.    Temp (24hrs), Avg:98.2 F (36.8 C), Min:98 F (36.7 C), Max:98.4 F (36.9 C)  Recent Labs  Lab 02/15/24 0522 02/15/24 0633  WBC 13.0*  --   CREATININE  --  19.01*    CrCl cannot be calculated (Unknown ideal weight.).    No Known Allergies  Antimicrobials this admission: 11/12 ceftazidime >>  11/12 vancomycin  >>  Microbiology results: 11/12 BCx:   Thank you for allowing pharmacy to be a part of this patient's care.  Larinda Herter M Ethyn Schetter 02/15/2024 12:28 PM

## 2024-02-15 NOTE — ED Provider Notes (Signed)
 I took over care of this patient at 7 AM pending nephrology consult and peritoneal dialysis fluid removal for testing.  On my evaluation he still having some pain but overall appears well. Physical Exam  BP 124/77   Pulse 98   Temp 98 F (36.7 C) (Oral)   Resp 18   SpO2 100%   Physical Exam Vitals and nursing note reviewed.  Constitutional:      General: He is not in acute distress.    Appearance: He is well-developed.  HENT:     Head: Normocephalic and atraumatic.  Eyes:     Conjunctiva/sclera: Conjunctivae normal.  Cardiovascular:     Rate and Rhythm: Normal rate and regular rhythm.     Heart sounds: No murmur heard. Pulmonary:     Effort: Pulmonary effort is normal. No respiratory distress.     Breath sounds: Normal breath sounds.  Abdominal:     Palpations: Abdomen is soft.     Tenderness: There is abdominal tenderness.  Musculoskeletal:        General: No swelling.     Cervical back: Neck supple.  Skin:    General: Skin is warm and dry.     Capillary Refill: Capillary refill takes less than 2 seconds.  Neurological:     Mental Status: He is alert.  Psychiatric:        Mood and Affect: Mood normal.     Procedures  Procedures  ED Course / MDM    Medical Decision Making Patient seen by me and he has some pain but not exquisitely tender.  Dialysis tech came down to draw the peritoneal dialysis fluid.  I spoke with Dr. Geralynn, nephrology and he recommend admitting the patient to hospital service but will start him on antibiotics.  I spoke with Dr. Claudene, hospitalist the patient be admitted for further workup and management.  Patient is agreeable to plan.  Problems Addressed: Anemia associated with chronic renal failure: chronic illness or injury End-stage renal disease on peritoneal dialysis [facility]): chronic illness or injury Lower abdominal pain: undiagnosed new problem with uncertain prognosis Mechanical complication due to peritoneal dialysis catheter, initial  encounter: undiagnosed new problem with uncertain prognosis Peritonitis (HCC): acute illness or injury  Amount and/or Complexity of Data Reviewed Labs: ordered. Radiology: ordered.  Risk OTC drugs. Prescription drug management. Drug therapy requiring intensive monitoring for toxicity. Decision regarding hospitalization.          Gennaro Duwaine CROME, DO 02/15/24 1155

## 2024-02-15 NOTE — ED Notes (Signed)
 Patient transported to CT

## 2024-02-15 NOTE — Progress Notes (Signed)
 Introduced self to patient,explained the purpose of being at his bedside.Obtained signed consent to access and obtained peritoneal fluid from her belly for diagnosis and treatment.With sterile set-up ,patient PD catheter was connected to [facility] system and 200 cc of cloudy PD fluid drained out/collected. . Patient tolerated the procedure.The writer hand brought the specimen to micro lab.Renal MD at the bedside.

## 2024-02-15 NOTE — ED Provider Notes (Signed)
 [place] EMERGENCY DEPARTMENT AT [facility] Provider Note   CSN: [id number] Arrival date & time: 02/15/24  0031     Patient presents with: Peritoneal Dialysis Cath Malfunction    Micheal Bean is a 38 y.o. male.   The history is provided by the patient.   He has history of end-stage renal disease on peritoneal dialysis and comes in complaining of left lower quadrant pain for the last 3 days.  He denies fever, chills, sweats.  He states he has noted some fiber in his dialysis fluid but no cloudiness.  However, yesterday he noted that the catheter was not draining properly.  He denies nausea or vomiting.    Prior to Admission medications   Medication Sig Start Date End Date Taking? Authorizing Provider  amLODipine -valsartan  (EXFORGE ) 10-160 MG tablet Take 1 tablet by mouth at bedtime. 11/20/23   [provider]  calcitRIOL  (ROCALTROL ) 0.5 MCG capsule Take 4 capsules (2 mcg total) by mouth 2 (two) times daily. Take inbetween meals Patient taking differently: Take 0.5 mcg by mouth daily. 06/11/22   Raenelle Donalda HERO, MD  Calcium  Acetate 667 MG TABS Take 667-1,334 mg by mouth See admin instructions. Take 2 tablets three times a day with each meal. Take 1 tablet with each snack.    [provider]  Elderberry-Vitamin C-Zinc (ELDERBERRY IMMUNE HEALTH GUMMY PO) Take 1 each by mouth daily.    [provider]  gentamicin  cream (GARAMYCIN ) 0.1 % Apply 1 Application topically daily. 05/21/21   [provider]  multivitamin (RENA-VIT) TABS tablet TAKE 1 TABLET BY MOUTH EVERYDAY AT BEDTIME Patient taking differently: Take 1 tablet by mouth daily with breakfast. 06/08/21   Tanda Bleacher, MD  sevelamer  carbonate (RENVELA ) 800 MG tablet Take 3,200 mg by mouth 3 (three) times daily with meals. 08/03/23   [provider]  Tenapanor  HCl, CKD, (XPHOZAH ) 30 MG TABS Take 30 mg by mouth daily.    [provider]  VELPHORO  500 MG chewable tablet  Chew 1,000 mg by mouth 3 (three) times daily. 11/07/23   [provider]    Allergies: Patient has no known allergies.    Review of Systems  All other systems reviewed and are negative.   Updated Vital Signs BP 119/70   Pulse 92   Temp 98.2 F (36.8 C)   Resp 16   SpO2 100%   Physical Exam Vitals and nursing note reviewed.   38 year old male, resting comfortably and in no acute distress. Vital signs are normal. Oxygen saturation is 100%, which is normal. Head is normocephalic and atraumatic. PERRLA, EOMI.  Lungs are clear without rales, wheezes, or rhonchi. Heart has regular rate and rhythm without murmur. Abdomen is soft, flat, with well localized tenderness in the left lower quadrant.  Peritoneal dialysis catheter is in place superior to the area of tenderness, no erythema or drainage or swelling noted at the catheter insertion site. Extremities have no cyanosis or edema, full range of motion is present. Skin is warm and dry without rash. Neurologic: Awake and alert, moves all extremities equally.  (all labs ordered are listed, but only abnormal results are displayed) Labs Reviewed  CBC WITH DIFFERENTIAL/PLATELET - Abnormal; Notable for the following components:      Result Value   WBC 13.0 (*)    RBC 2.41 (*)    Hemoglobin 7.1 (*)    HCT 22.6 (*)    Platelets 558 (*)    Neutro Abs 9.9 (*)  Abs Immature Granulocytes 0.11 (*)    All other components within normal limits  BODY FLUID CULTURE W GRAM STAIN  BODY FLUID CELL COUNT WITH DIFFERENTIAL  COMPREHENSIVE METABOLIC PANEL WITH GFR  LIPASE, BLOOD    Radiology: CT ABDOMEN PELVIS W CONTRAST Result Date: 02/15/2024 CLINICAL DATA:  Left lower quadrant abdominal pain. EXAM: CT ABDOMEN AND PELVIS WITH CONTRAST TECHNIQUE: Multidetector CT imaging of the abdomen and pelvis was performed using the standard protocol following bolus administration of intravenous contrast. RADIATION DOSE REDUCTION: This exam was  performed according to the departmental dose-optimization program which includes automated exposure control, adjustment of the mA and/or kV according to patient size and/or use of iterative reconstruction technique. CONTRAST:  75mL OMNIPAQUE IOHEXOL 350 MG/ML SOLN COMPARISON:  11/13/2020 FINDINGS: Lower chest: Unremarkable. Hepatobiliary: No suspicious focal abnormality within the liver parenchyma. A tiny hypodensity in the liver parenchyma is too small to characterize but is statistically most likely benign. No followup imaging is recommended. There is no evidence for gallstones, gallbladder wall thickening, or pericholecystic fluid. No intrahepatic or extrahepatic biliary dilation. Pancreas: No focal mass lesion. No dilatation of the main duct. No intraparenchymal cyst. No peripancreatic edema. Spleen: No splenomegaly. No suspicious focal mass lesion. Adrenals/Urinary Tract: No adrenal nodule or mass. Right kidney surgically absent. Left kidney atrophic with multiple cystic lesions of varying size and attenuation. No overtly suspicious left renal mass. Multiple calcifications are noted in the left kidney. No hydroureter. Bladder is decompressed. Although decompressed state of the gallbladder accentuates wall thickness, there appears to be some associated extraperitoneal circumferential perivesical edema. Stomach/Bowel: Stomach is unremarkable. No gastric wall thickening. No evidence of outlet obstruction. Duodenum is normally positioned as is the ligament of Treitz. No small bowel obstruction. Small bowel loops in the low pelvis show mild wall thickening with congested mesentery in interloop mesenteric fluid. Wall of the terminal ileum may be hyperenhancing appears ill-defined. The appendix is not well visualized, but there is no edema or inflammation in the region of the cecal tip to suggest appendicitis. No gross colonic mass. No colonic wall thickening. Vascular/Lymphatic: No abdominal aortic aneurysm. No  abdominal aortic atherosclerotic calcification. There is no gastrohepatic or hepatoduodenal ligament lymphadenopathy. No retroperitoneal or mesenteric lymphadenopathy. No pelvic sidewall lymphadenopathy. Reproductive: The prostate gland and seminal vesicles are unremarkable. Other: Moderate to large volume free fluid identified in the abdomen and pelvis with associated intraperitoneal free gas. These findings are nonspecific in the setting of the patient's peritoneal dialysis catheter which is coiled in the central low pelvis. No overt peritoneal enhancement or thickening. Musculoskeletal: Diffuse bony changes compatible with chronic renal disease. Bilateral pars interarticularis defects noted at L5. IMPRESSION: 1. Moderate to large volume free fluid in the abdomen and pelvis with associated intraperitoneal free gas. These findings are nonspecific in the setting of the patient's peritoneal dialysis catheter which is coiled in the central low pelvis. No overt peritoneal enhancement or thickening. 2. Small bowel loops in the low pelvis show mild wall thickening with congested mesentery and interloop mesenteric fluid. Wall of the terminal ileum may be hyperenhancing and appears ill-defined. Imaging features may be related to infectious/inflammatory enteritis. Despite the lack of overt peritoneal thickening/enhancement, correlation for signs/symptoms of peritonitis recommended. 3. Although decompressed state of the gallbladder accentuates wall thickness, there appears to be some associated extraperitoneal circumferential perivesical edema. Correlation with urinalysis recommended to exclude cystitis. 4. Right kidney surgically absent. Left kidney atrophic with multiple cystic lesions of varying size and attenuation. No overtly suspicious left renal  mass. 5. Diffuse bony changes compatible with chronic renal disease. Electronically Signed   By: Camellia Candle M.D.   On: 02/15/2024 06:28     Procedures   Medications  Ordered in the ED - No data to display                                  Medical Decision Making Amount and/or Complexity of Data Reviewed Labs: ordered. Radiology: ordered.  Risk Prescription drug management.   Left lower quadrant pain and patient on peritoneal dialysis.  This a presentation with wide range of treatment options and carries with it a high risk of morbidity and complications.  Differential diagnosis includes, but is not limited to, bacterial peritonitis, diverticulitis, urolithiasis.  I actually doubt bacterial peritonitis because well localized to the tenderness is.  He has been on dialysis for over 3 years, I have ordered CT of abdomen and pelvis with contrast and I have ordered screening labs.  I have reviewed his past records, and note ED visit on 12/04/2023 with similar complaint, hospital admission 11/26/2023-11/29/2023 for placement of new peritoneal dialysis catheter.  I discussed case with Dr. Jerrye, on-call for nephrology, who states that she will arrange for dialysis nurse to come in and evaluate this and also get specimen for culture and cell count to make sure he does not have peritonitis.  I reviewed his laboratory tests, and my interpretation is mild leukocytosis which is nonspecific, anemia with significant drop in hemoglobin compared with 12/04/2023.  CT of abdomen and pelvis shows inflammatory changes which could be related to peritonitis.  I have independently viewed the images, and agree with the radiologist's interpretation.  Metabolic panel and peritoneal fluid are still pending.  Case is signed out to Dr. Gennaro.     Final diagnoses:  Mechanical complication due to peritoneal dialysis catheter, initial encounter  End-stage renal disease on peritoneal dialysis [facility])  Lower abdominal pain  Anemia associated with chronic renal failure    ED Discharge Orders     None          Raford Lenis, MD 02/15/24 [phone]

## 2024-02-15 NOTE — Consult Note (Signed)
 Micheal Service Consult Note [facility] JONETTA Fret, Micheal Bean  Patient: Micheal Bean Date: 02/15/2024 Requesting Physician: Dr. FABIENE Sharps  Reason for Consult: ESRD patient on PD with abdominal pain HPI: The patient is a 38 y.o. year-old w/ PMH as below who presented to ED late last night for lower abdominal pain and poor PD catheter drainage. Abd pain for last 3 days. Pt to be admitted, we are asked to see for ESRD.   Pt seen in room. PD fluid was drained out this am and looked quite cloudy. Suspect he has peritonitis. Has been on PD for about 3 yrs w/o infection, this would be his first.    ROS - denies CP, no joint pain, no HA, no blurry vision, no rash, no diarrhea, no nausea/ vomiting   Past Medical History  Past Medical History:  Diagnosis Date   Anemia    ESRD (end stage Micheal disease) (HCC)    PD at home   History of blood transfusion 11/2020   4 units   History of blood transfusion    Seizure (HCC)    [facility]   Past Surgical History  Past Surgical History:  Procedure Laterality Date   AV FISTULA PLACEMENT Right 03/02/2021   Procedure: RIGHT ARM Radio-Cephalic  ARTERIOVENOUS (AV) FISTULA CREATION;  Surgeon: Lanis Fonda BRAVO, Micheal Bean;  Location: [facility] OR;  Service: Vascular;  Laterality: Right;   CAPD INSERTION N/A 05/20/2021   Procedure: LAPAROSCOPIC INSERTION CONTINUOUS AMBULATORY PERITONEAL DIALYSIS  CATHETER WITH POSSIBLE OMENTOPEXY;  Surgeon: Magda Debby SAILOR, Micheal Bean;  Location: [facility] OR;  Service: Vascular;  Laterality: N/A;   CAPD INSERTION N/A 11/27/2023   Procedure: LAPAROSCOPIC INSERTION CONTINUOUS AMBULATORY PERITONEAL DIALYSIS  (CAPD) CATHETER;  Surgeon: Serene Gaile ORN, Micheal Bean;  Location: [facility] OR;  Service: Vascular;  Laterality: N/A;   IR FLUORO GUIDE CV LINE RIGHT  11/18/2020   IR US  GUIDE VASC ACCESS RIGHT  11/18/2020   NEPHRECTOMY     childhhod, RT side   PARATHYROIDECTOMY Left 05/31/2022   Procedure: TOTAL PARATHYROIDECTOMY WITH  AUTOTRANSPLANT TO LEFT FOREARM;  Surgeon: Eletha Boas, Micheal Bean;  Location: [facility] OR;  Service: General;  Laterality: Left;   QUADRICEPS TENDON REPAIR Right 10/29/2021   Procedure: RIGHT QUADRICEP REPAIR AND PRIMARY REPAIR ANTERIOR CAPSULE;  Surgeon: Doll Skates, Micheal Bean;  Location: [facility] OR;  Service: Orthopedics;  Laterality: Right;   Family History  Family History  Problem Relation Age of Onset   Hypertension Mother    Kidney disease Maternal Grandmother    Social History  reports that he has never smoked. He has never used smokeless tobacco. He reports that he does not currently use alcohol. He reports that he does not currently use drugs. Allergies No Known Allergies Home medications Prior to Admission medications   Medication Sig Start Date End Date Taking? Authorizing Provider  acetaminophen  (TYLENOL ) 500 MG tablet Take 1,000 mg by mouth daily as needed for mild pain (pain score 1-3) or moderate pain (pain score 4-6).   Yes Provider, Historical, Micheal Bean  amLODipine -valsartan  (EXFORGE ) 10-160 MG tablet Take 1 tablet by mouth daily. 11/20/23  Yes Provider, Historical, Micheal Bean  calcitRIOL  (ROCALTROL ) 0.5 MCG capsule Take 0.5 mcg by mouth daily.   Yes Provider, Historical, Micheal Bean  gentamicin  cream (GARAMYCIN ) 0.1 % Apply 1 Application topically daily. When changing his dressing 05/21/21  Yes Provider, Historical, Micheal Bean  metoprolol succinate (TOPROL-XL) 25 MG 24 hr tablet Take 25 mg by mouth daily. 11/30/23  Yes Provider, Historical, Micheal Bean  multivitamin (RENA-VIT) TABS tablet TAKE 1 TABLET BY MOUTH EVERYDAY AT BEDTIME Patient taking differently: Take 1 tablet by mouth daily. 06/08/21  Yes Tanda Bleacher, Micheal Bean  Tenapanor  HCl, CKD, (XPHOZAH ) 30 MG TABS Take 30 mg by mouth daily as needed (constipation).   Yes Provider, Historical, Micheal Bean  VELPHORO  500 MG chewable tablet Chew 1,000 mg by mouth in the morning and at bedtime. 11/07/23  Yes Provider, Historical, Micheal Bean  Calcium  Acetate 667 MG TABS Take 667-1,334 mg by mouth See admin  instructions. Take 2 tablets three times a day with each meal. Take 1 tablet with each snack. Patient not taking: Reported on 02/15/2024    Provider, Historical, Micheal Bean  sevelamer  carbonate (RENVELA ) 800 MG tablet Take 3,200 mg by mouth 3 (three) times daily with meals. Patient not taking: Reported on 02/15/2024 08/03/23   Provider, Historical, Micheal Bean     Vitals:   02/15/24 0700 02/15/24 1000 02/15/24 1039 02/15/24 1100  BP: 121/70 120/79  124/77  Pulse: 97 93  98  Resp: 17 18    Temp:   98 F (36.7 C)   TempSrc:   Oral   SpO2: 100% 100%  100%   Exam Gen alert, no distress Sclera anicteric, throat clear  No jvd or bruits Chest clear bilat to bases RRR no MRG Abd soft, no ascites, PD cath intact, minor tenderness in lower abdomen Ext no LE or UE edema, no other edema Neuro is alert, Ox 3 , nf     Home bp meds: Amlodipine -valsartan  10-160mg  every day Toprol xl 25 mg every day    OP PD: [facility] 7 nights/ wk, Dr Marlee 6 exchanges, 2800 fill vol, 1hr dwells 73.5kg dry wt Last vol 2 L , 1 daybag Last 3 mircera 200 mcg were on 8/27, 9/11 and 11/04   Assessment/ Plan: Abd pain /cloudy PD fluid: suspect PD cath related peritonitis. Fluid drained out this am in ED room was quite cloudy. This is his 1st infection per the pt (on for 3 yrs). Plan is for fluid cell ct, culture, and IV abx (vanc/ fortaz) per pharmacy.  ESRD: on CPPD. PD tonight.  HTN: bp's are normal, cont home meds, hold for SBP < 120.  Volume: euvolemic on exam, get standing wt when upstairs. All 1.5% fluid tonight.  Anemia of esrd: Hb low at 7.1. Last OP Hb 7.8 on 11/03, prior to that was 12.8 on 10/6 and 10.8 on 9/17. Has been getting ESA (mircera) at about 200 mcg per month, but this is a big drop in Hb for him. Will d/w pmd. Next esa due 11/21.    Micheal Fret  Micheal Bean CKA 02/15/2024, 11:53 AM  Recent Labs  Lab 02/15/24 0522 02/15/24 0633  HGB 7.1*  --   ALBUMIN   --  1.7*  CALCIUM   --  8.1*  CREATININE  --  19.01*  K   --  3.4*   Inpatient medications:  heparin   5,000 Units Subcutaneous Q8H    acetaminophen  **OR** acetaminophen , albuterol 

## 2024-02-16 DIAGNOSIS — K652 Spontaneous bacterial peritonitis: Secondary | ICD-10-CM | POA: Diagnosis not present

## 2024-02-16 LAB — TYPE AND SCREEN
ABO/RH(D): B POS
Antibody Screen: NEGATIVE
Unit division: 0

## 2024-02-16 LAB — RENAL FUNCTION PANEL
Albumin: 1.8 g/dL — ABNORMAL LOW (ref 3.5–5.0)
Anion gap: 18 — ABNORMAL HIGH (ref 5–15)
BUN: 67 mg/dL — ABNORMAL HIGH (ref 6–20)
CO2: 23 mmol/L (ref 22–32)
Calcium: 8.9 mg/dL (ref 8.9–10.3)
Chloride: 92 mmol/L — ABNORMAL LOW (ref 98–111)
Creatinine, Ser: 19.42 mg/dL — ABNORMAL HIGH (ref 0.61–1.24)
GFR, Estimated: 3 mL/min — ABNORMAL LOW (ref >60)
Glucose, Bld: 88 mg/dL (ref 70–99)
Phosphorus: 6.1 mg/dL — ABNORMAL HIGH (ref 2.5–4.6)
Potassium: 3.2 mmol/L — ABNORMAL LOW (ref 3.5–5.1)
Sodium: 133 mmol/L — ABNORMAL LOW (ref 135–145)

## 2024-02-16 LAB — BPAM RBC
Blood Product Expiration Date: 202511272359
ISSUE DATE / TIME: 202511121736
Unit Type and Rh: 7300

## 2024-02-16 LAB — CBC
HCT: 24.4 % — ABNORMAL LOW (ref 39.0–52.0)
Hemoglobin: 8 g/dL — ABNORMAL LOW (ref 13.0–17.0)
MCH: 28.8 pg (ref 26.0–34.0)
MCHC: 32.8 g/dL (ref 30.0–36.0)
MCV: 87.8 fL (ref 80.0–100.0)
Platelets: 486 K/uL — ABNORMAL HIGH (ref 150–400)
RBC: 2.78 MIL/uL — ABNORMAL LOW (ref 4.22–5.81)
RDW: 15 % (ref 11.5–15.5)
WBC: 8.7 K/uL (ref 4.0–10.5)
nRBC: 0 % (ref 0.0–0.2)

## 2024-02-16 LAB — BODY FLUID CELL COUNT WITH DIFFERENTIAL
Eos, Fluid: 32 %
Lymphs, Fluid: 5 %
Monocyte-Macrophage-Serous Fluid: 14 % — ABNORMAL LOW (ref 50–90)
Neutrophil Count, Fluid: 49 % — ABNORMAL HIGH (ref 0–25)
Total Nucleated Cell Count, Fluid: 2470 uL — ABNORMAL HIGH (ref 0–1000)

## 2024-02-16 LAB — PATHOLOGIST SMEAR REVIEW

## 2024-02-16 MED ORDER — POTASSIUM CHLORIDE CRYS ER 20 MEQ PO TBCR
40.0000 meq | EXTENDED_RELEASE_TABLET | Freq: Once | ORAL | Status: AC
  Administered 2024-02-16: 40 meq via ORAL
  Filled 2024-02-16: qty 2

## 2024-02-16 MED ORDER — LACTULOSE 10 GM/15ML PO SOLN
30.0000 g | ORAL | Status: AC
  Administered 2024-02-16: 30 g via ORAL
  Filled 2024-02-16: qty 45

## 2024-02-16 MED ORDER — LACTULOSE 10 GM/15ML PO SOLN
20.0000 g | Freq: Every day | ORAL | Status: DC
  Administered 2024-02-16 – 2024-02-18 (×2): 20 g via ORAL
  Filled 2024-02-16 (×3): qty 30

## 2024-02-16 MED ORDER — DOCUSATE SODIUM 100 MG PO CAPS
100.0000 mg | ORAL_CAPSULE | Freq: Two times a day (BID) | ORAL | Status: DC
  Administered 2024-02-16 – 2024-02-18 (×4): 100 mg via ORAL
  Filled 2024-02-16 (×4): qty 1

## 2024-02-16 MED ORDER — PROSOURCE PLUS PO LIQD
30.0000 mL | Freq: Two times a day (BID) | ORAL | Status: DC
  Administered 2024-02-16 – 2024-02-18 (×3): 30 mL via ORAL
  Filled 2024-02-16 (×3): qty 30

## 2024-02-16 MED ORDER — TENAPANOR HCL (CKD) 30 MG PO TABS: 30.0000 mg | ORAL_TABLET | Freq: Two times a day (BID) | ORAL | Status: DC

## 2024-02-16 MED ORDER — PANTOPRAZOLE SODIUM 40 MG PO TBEC
40.0000 mg | DELAYED_RELEASE_TABLET | Freq: Every day | ORAL | Status: DC
  Administered 2024-02-16 – 2024-02-18 (×3): 40 mg via ORAL
  Filled 2024-02-16 (×3): qty 1

## 2024-02-16 NOTE — Progress Notes (Addendum)
 Met again with patient. No BM yet despite 1 dose Lactulose ~3hr ago. Will give another and adding stool softeners too. Has been resting in bed - encouraged to get up and walk around as well.  Pharmacy does not stock Tenapanor  (Xphozah ) - ok to take home supply per pharmacy. Mom can pick up if they would like.  Izetta Boehringer, PA-C [employer] Pager [phone]

## 2024-02-16 NOTE — Progress Notes (Signed)
   02/16/24 0728  Peritoneal Catheter Left lower abdomen Continuous ambulatory  Placement Date/Time: 11/27/23 1140   Serial / Lot #: [id number]  Expiration Date: 02/02/28  Procedural Verification: Site marked with initials;Medical records & consent reviewed;Relevant studies,results and images reviewed  Time out: Correct Patient;Corre...  Site Assessment Clean, Dry, Intact  Drainage Description None  Catheter status Deaccessed  Dressing Status Clean, Dry, Intact  Completion  Treatment Status Complete  Initial Drain Volume 3  Average Dwell Time-Hour(s) 1  Average Dwell Time-Min(s) 30  Average Drain Time 114  Total Therapy Volume 83199  Total Therapy Time-Hour(s) 5  Total Therapy Time-Min(s) 53  Effluent Appearance Clear;Yellow  Cell Count on Daytime Exchange N/A  Fluid Balance - CCPD  Total UF (- value on cycler, pt gain) -2794 mL  Procedure Comments  Tolerated treatment well? No (comment)  Peritoneal Dialysis Comments Pt. complaint of draining slow with PD treatment and he turn the machine off because the treatment was in the negative and his abdomen was uncomfortable. RN went to check on pt. treatment because Floor Nurse call Hemodialysis. RN from Hemodialysis went to check on pt. and pt request to come off PD machine  Education / Care Plan  Dialysis Education Provided Yes  Hand-off documentation  Hand-off Given Given to shift RN/LPN  Report given to (Full Name) Erie Browner RN  Hand-off Received Received from shift RN/LPN  Report received from (Full Name) Zebedee Mace RN

## 2024-02-16 NOTE — Progress Notes (Signed)
 Pt receives out-pt PD care through [facility] home therapy dept. Home therapy staff aware of pt's hospitalization. Will assist as needed.   Randine Mungo Dialysis Navigator [phone]

## 2024-02-16 NOTE — Consult Note (Addendum)
 [facility] [place]  ASSESSMENT / PLAN: 38 y.o. male with PD catheter associated peritonitis.  He underwent revision of PD catheter in August 2025 with Dr. Serene.  The catheter is not performing well.  Recent CT scan shows no technical problems with the catheter.  Diagnostic laparoscopy and catheter manipulation will not help treat a infected catheter. I would not recommend catheter exchange because of active infection.  The only surgery I would recommend is a PD catheter removal, which the nephrology service prefers to avoid. Please call for any questions.  CHIEF COMPLAINT: abdominal pain  HISTORY OF PRESENT ILLNESS: Micheal Bean is a 38 y.o. male with end-stage renal disease dialyzing via peritoneal dialysis.  He recently underwent PD catheter revision in August 2025 with Dr. Serene.  His catheter has been working well in the last several days.  The catheter will not drain.  Cathflo has been attempted in the catheter without much success.  The patient is fairly tender in the abdomen.  Peritoneal fluid worrisome for PD associated peritonitis. CT scan of the abdomen shows no technical problems with the catheter.   Past Medical History:  Diagnosis Date   Anemia    ESRD (end stage renal disease) (HCC)    PD at home   History of blood transfusion 11/2020   4 units   History of blood transfusion    Seizure (HCC)    [facility]    Past Surgical History:  Procedure Laterality Date   AV FISTULA PLACEMENT Right 03/02/2021   Procedure: RIGHT ARM Radio-Cephalic  ARTERIOVENOUS (AV) FISTULA CREATION;  Surgeon: Lanis Fonda BRAVO, MD;  [facility] OR;  Service: Vascular;  Laterality: Right;   CAPD INSERTION N/A 05/20/2021   Procedure: LAPAROSCOPIC INSERTION CONTINUOUS AMBULATORY PERITONEAL DIALYSIS  CATHETER WITH POSSIBLE OMENTOPEXY;  Surgeon: Magda Debby SAILOR, MD;  Location: [facility] OR;  Service: Vascular;  Laterality: N/A;   CAPD INSERTION N/A 11/27/2023    Procedure: LAPAROSCOPIC INSERTION CONTINUOUS AMBULATORY PERITONEAL DIALYSIS  (CAPD) CATHETER;  Surgeon: Serene Gaile ORN, MD;  Location: [facility] OR;  Service: Vascular;  Laterality: N/A;   IR FLUORO GUIDE CV LINE RIGHT  11/18/2020   IR US  GUIDE VASC ACCESS RIGHT  11/18/2020   NEPHRECTOMY     childhhod, RT side   PARATHYROIDECTOMY Left 05/31/2022   Procedure: TOTAL PARATHYROIDECTOMY WITH AUTOTRANSPLANT TO LEFT FOREARM;  Surgeon: Eletha Boas, MD;  Location: [facility] OR;  Service: General;  Laterality: Left;   QUADRICEPS TENDON REPAIR Right 10/29/2021   Procedure: RIGHT QUADRICEP REPAIR AND PRIMARY REPAIR ANTERIOR CAPSULE;  Surgeon: Doll Skates, MD;  Location: [facility] OR;  Service: Orthopedics;  Laterality: Right;    Family History  Problem Relation Age of Onset   Hypertension Mother    Kidney disease Maternal Grandmother     Social History   Socioeconomic History   Marital status: Single    Spouse name: Not on file   Number of children: 2   Years of education: Not on file   Highest education level: Not on file  Occupational History   Not on file  Tobacco Use   Smoking status: Never   Smokeless tobacco: Never  Vaping Use   Vaping status: Never Used  Substance and Sexual Activity   Alcohol use: Not Currently   Drug use: Not Currently   Sexual activity: Not Currently  Other Topics Concern   Not on file  Social History Narrative   Not on file   Social Drivers  of Health   Financial Resource Strain: Not on file  Food Insecurity: No Food Insecurity (02/15/2024)   Hunger Vital Sign    Worried About Running Out of Food in the Last Year: Never true    Ran Out of Food in the Last Year: Never true  Transportation Needs: No Transportation Needs (02/15/2024)   PRAPARE - Administrator, Civil Service (Medical): No    Lack of Transportation (Non-Medical): No  Physical Activity: Not on file  Stress: Not on file  Social Connections: Not on file  Intimate Partner Violence: Not At Risk  (02/15/2024)   Humiliation, Afraid, Rape, and Kick questionnaire    Fear of Current or Ex-Partner: No    Emotionally Abused: No    Physically Abused: No    Sexually Abused: No    No Known Allergies  Current Facility-Administered Medications  Medication Dose Route Frequency Provider Last Rate Last Admin   (feeding supplement) PROSource Plus liquid 30 mL  30 mL Oral BID BM Stovall, Kathryn R, PA-C       acetaminophen  (TYLENOL ) tablet 650 mg  650 mg Oral Q6H PRN Smith, Rondell A, MD   650 mg at 02/15/24 2105   Or   acetaminophen  (TYLENOL ) suppository 650 mg  650 mg Rectal Q6H PRN Claudene Maximino LABOR, MD       albuterol  (PROVENTIL ) (2.5 MG/3ML) 0.083% nebulizer solution 2.5 mg  2.5 mg Nebulization Q6H PRN Claudene Maximino A, MD       amLODipine  (NORVASC ) tablet 10 mg  10 mg Oral Daily Uhlorn, Garett M, RPH   10 mg at 02/16/24 9060   And   irbesartan  (AVAPRO ) tablet 150 mg  150 mg Oral Daily Marten Peat M, RPH   150 mg at 02/16/24 0940   calcitRIOL  (ROCALTROL ) capsule 0.5 mcg  0.5 mcg Oral Daily Claudene, Rondell A, MD   0.5 mcg at 02/16/24 0940   cefTAZidime (FORTAZ) 1 g in sodium chloride  0.9 % 100 mL IVPB  1 g Intravenous Q24H Smith, Rondell A, MD 200 mL/hr at 02/16/24 0937 1 g at 02/16/24 9062   dialysis solution 1.5% low-MG/low-CA dianeal solution   Intraperitoneal Q24H Geralynn Charleston, MD   New Bag at 02/15/24 1933   fentaNYL  (SUBLIMAZE ) injection 25 mcg  25 mcg Intravenous Q2H PRN Smith, Rondell A, MD       gentamicin  cream (GARAMYCIN ) 0.1 % 1 Application  1 Application Topical Daily Geralynn Charleston, MD   1 Application at 02/16/24 1028   heparin  sodium (porcine) 3,000 Units in dialysis solution 1.5% low-MG/low-CA 6,000 mL dialysis solution   Peritoneal Dialysis PRN Geralynn Charleston, MD   Given at 02/15/24 1931   lactulose (CHRONULAC) 10 GM/15ML solution 20 g  20 g Oral Q1200 Stovall, Kathryn R, PA-C       metoprolol succinate (TOPROL-XL) 24 hr tablet 25 mg  25 mg Oral Daily Claudene, Rondell A,  MD   25 mg at 02/16/24 9060   multivitamin (RENA-VIT) tablet 1 tablet  1 tablet Oral Daily Claudene Maximino A, MD   1 tablet at 02/16/24 0940   pantoprazole (PROTONIX) EC tablet 40 mg  40 mg Oral Daily Adhikari, Amrit, MD   40 mg at 02/16/24 9060   sucroferric oxyhydroxide (VELPHORO ) chewable tablet 1,000 mg  1,000 mg Oral BID Smith, Rondell A, MD   1,000 mg at 02/16/24 9061   Tenapanor  HCl (CKD) TABS 30 mg  30 mg Oral BID Stovall, Kathryn R, PA-C  vancomycin  variable dose per unstable renal function (pharmacist dosing)   Does not apply See admin instructions Claudene Reeves A, MD        PHYSICAL EXAM Vitals:   02/16/24 0500 02/16/24 0730 02/16/24 0905 02/16/24 1000  BP:  128/78 (!) 136/92   Pulse:  93 (!) 113   Resp:  18 (!) 28 14  Temp:  99.2 F (37.3 C) 98.3 F (36.8 C)   TempSrc:  Oral    SpO2:  100% 98%   Weight: 70.2 kg     Height:       Young man in no distress Regular rate and rhythm Unlabored breathing Tender abdomen with PD catheter in place  PERTINENT LABORATORY AND RADIOLOGIC DATA  Most recent CBC    Latest Ref Rng & Units 02/16/2024    4:02 AM 02/15/2024    5:22 AM 12/04/2023    8:08 AM  CBC  WBC 4.0 - 10.5 K/uL 8.7  13.0  8.7   Hemoglobin 13.0 - 17.0 g/dL 8.0  7.1  9.6   Hematocrit 39.0 - 52.0 % 24.4  22.6  30.3   Platelets 150 - 400 K/uL 486  558  600      Most recent CMP    Latest Ref Rng & Units 02/16/2024    4:02 AM 02/15/2024    6:33 AM 12/04/2023    8:08 AM  CMP  Glucose 70 - 99 mg/dL 88  87  99   BUN 6 - 20 mg/dL 67  68  62   Creatinine 0.61 - 1.24 mg/dL 80.57  80.98  79.34   Sodium 135 - 145 mmol/L 133  132  125   Potassium 3.5 - 5.1 mmol/L 3.2  3.4  4.3   Chloride 98 - 111 mmol/L 92  92  89   CO2 22 - 32 mmol/L 23  24  20    Calcium  8.9 - 10.3 mg/dL 8.9  8.1  8.3   Total Protein 6.5 - 8.1 g/dL  5.6    Total Bilirubin 0.0 - 1.2 mg/dL  0.7    Alkaline Phos 38 - 126 U/L  60    AST 15 - 41 U/L  16    ALT 0 - 44 U/L  27      CT scan of  the abdomen pelvis personally reviewed.  The PD catheter is in good position without technical problems. There is an abundance of fluid in the abdomen suggesting poor drainage of the catheter.   Debby SAILOR. Magda, MD FACS [facility] Phone Number: [phone] 02/16/2024 11:40 AM   Total time spent on preparing this encounter including chart review, data review, collecting history, examining the patient, and coordinating care: 60 min  Portions of this report may have been transcribed using voice recognition software.  Every effort has been made to ensure accuracy; however, inadvertent computerized transcription errors may still be present.

## 2024-02-16 NOTE — Progress Notes (Signed)
 PROGRESS NOTE  Micheal Bean  FMW:[id number] DOB: March 10, 1986 DOA: 02/15/2024 PCP: Tanda Bleacher, MD   Brief Narrative: Patient is a 38 year old male with history of ESRD on peritoneal dialysis, anemia of chronic kidney disease, secondary hyperparathyroidism who presented with left lower quadrant abdominal pain.  Abdominal pain had been going on for 3 days, localized around his peritoneal dialysis catheter, he was managing with Tylenol .  He became unable to perform dialysis, experience shortness of breath and thus presented to the emergency department.  On presentation, he was in sinus tachycardia with normal blood pressure.  Lab work showed WC count of 13, hemoglobin of 7.1, potassium of 3.4, creatinine of 19, lipase of 66.  CT abdomen/pelvis showed moderate to large volume free fluid in the abdomen with associated intraperitoneal gas, small bowel loops in the lower pelvis show mild wall thickening with congested mesentery and interloop mesenteric fluid with findings concerning for possible infection.  Nephrology consulted for dialysis.  Peritoneal fluid analysis suggest SBP.  Peritoneal fluid culture sent.  Currently on broad spectrum antibiotics  Assessment & Plan:  Principal Problem:   SBP (spontaneous bacterial peritonitis) (HCC) Active Problems:   Sepsis (HCC)   Anemia of chronic disease   End-stage renal disease on peritoneal dialysis (HCC)   Hypokalemia   Essential hypertension   Thrombocytosis  Sepsis secondary to spontaneous bacterial peritonitis: Presented with 3-day history of abdominal pain, unable to perform peritoneal dialysis.  Presented with leukocytosis, tachycardia.  Peritoneal fluid analysis concerning for peritonitis. Also showed PD cathter possibly coiled in the central low pelvis.  Follow-up peritoneal fluid culture.  Continue vancomycin , ceftazidime.  We might involve ID at some point.  Continue pain medications.  Abdomen remains mildly distended and tender on  examination. Vascular surgery recommending PD catheter removal  Anemia of chronic disease: Secondary to CKD.  Hemoglobin on presentation  in the range of 7.  Baseline hemoglobin around 9-10.  Denies any hematochezia or melena.  FOBT ordered Continue Protonix  ESRD on PD : Nephrology following for dialysis.  Hyperphosphatemia: Continue phosphate binders as nephrology.  Currently he was not taking any.  Hypokalemia: Being supplemented and monitored  Hypertension: Currently blood pressure stable.  Continue home medications         DVT prophylaxis:hep [place]     Code Status: Full Code  Family Communication: Mom at bedside on 11/13  Patient status:Inpatient  Patient is from :Home  Anticipated discharge un:Ynfz  Estimated DC date:2-3 days   Consultants: Nephrology, vascular surgery  Procedures: None yet  Antimicrobials:  Anti-infectives (From admission, onward)    Start     Dose/Rate Route Frequency Ordered Stop   02/15/24 1248  vancomycin  variable dose per unstable renal function (pharmacist dosing)         Does not apply See admin instructions 02/15/24 1248     02/15/24 1245  vancomycin  (VANCOREADY) IVPB 1750 mg/350 mL        1,750 mg 175 mL/hr over 120 Minutes Intravenous  Once 02/15/24 1233 02/15/24 1635   02/15/24 1215  cefTAZidime (FORTAZ) 1 g in sodium chloride  0.9 % 100 mL IVPB        1 g 200 mL/hr over 30 Minutes Intravenous Every 24 hours 02/15/24 1202     02/15/24 1145  cefTRIAXone  (ROCEPHIN ) 2 g in sodium chloride  0.9 % 100 mL IVPB  Status:  Discontinued        2 g 200 mL/hr over 30 Minutes Intravenous Every 24 hours 02/15/24 1142 02/15/24 1151  Subjective: Patient seen and examined at bedside today.  Lying in bed.  Overall comfortable.  In mild sinus tachycardia.  Afebrile.  Complains of lower abdominal discomfort.  No nausea or vomiting mildly  distended  and has diffuse tenderness   Objective: Vitals:   02/15/24 2104 02/16/24 0457 02/16/24 0500  02/16/24 0730  BP: 123/80 128/87  128/78  Pulse: (!) 106 (!) 109  93  Resp: 18 18  18   Temp: 99.2 F (37.3 C) 99 F (37.2 C)  99.2 F (37.3 C)  TempSrc: Oral   Oral  SpO2: 100% 100%  100%  Weight:   70.2 kg   Height:        Intake/Output Summary (Last 24 hours) at 02/16/2024 9177 Last data filed at 02/16/2024 9271 Gross per 24 hour  Intake -1958.73 ml  Output --  Net -1958.73 ml   Filed Weights   02/15/24 1429 02/16/24 0500  Weight: 70.1 kg 70.2 kg    Examination:  General exam: Overall comfortable, not in distress HEENT: PERRL Respiratory system:  no wheezes or crackles  Cardiovascular system:sinus tachycardia Gastrointestinal system: Abdomen is somewhat distended, has generalized tenderness, Peritoneal dialysis catheter Central nervous system: Alert and oriented Extremities: No edema, no clubbing ,no cyanosis Skin: No rashes, no ulcers,no icterus     Data Reviewed: I have personally reviewed following labs and imaging studies  CBC: Recent Labs  Lab 02/15/24 0522 02/16/24 0402  WBC 13.0* 8.7  NEUTROABS 9.9*  --   HGB 7.1* 8.0*  HCT 22.6* 24.4*  MCV 93.8 87.8  PLT 558* 486*   Basic Metabolic Panel: Recent Labs  Lab 02/15/24 0633 02/16/24 0402  NA 132* 133*  K 3.4* 3.2*  CL 92* 92*  CO2 24 23  GLUCOSE 87 88  BUN 68* 67*  CREATININE 19.01* 19.42*  CALCIUM  8.1* 8.9  PHOS  --  6.1*     Recent Results (from the past 240 hours)  Body fluid culture w Gram Stain     Status: None (Preliminary result)   Collection Time: 02/15/24  5:44 AM   Specimen: Peritoneal Dialysate; Body Fluid  Result Value Ref Range Status   Specimen Description PERITONEAL DIALYSATE  Final   Special Requests NONE  Final   Gram Stain   Final    RARE WBC PRESENT,BOTH PMN AND MONONUCLEAR NO ORGANISMS SEEN Performed at [facility], [address], [place], [place] [address]    Culture PENDING  Incomplete   Report Status PENDING  Incomplete     Radiology Studies: CT  ABDOMEN PELVIS W CONTRAST Result Date: 02/15/2024 CLINICAL DATA:  Left lower quadrant abdominal pain. EXAM: CT ABDOMEN AND PELVIS WITH CONTRAST TECHNIQUE: Multidetector CT imaging of the abdomen and pelvis was performed using the standard protocol following bolus administration of intravenous contrast. RADIATION DOSE REDUCTION: This exam was performed according to the departmental dose-optimization program which includes automated exposure control, adjustment of the mA and/or kV according to patient size and/or use of iterative reconstruction technique. CONTRAST:  75mL OMNIPAQUE IOHEXOL 350 MG/ML SOLN COMPARISON:  11/13/2020 FINDINGS: Lower chest: Unremarkable. Hepatobiliary: No suspicious focal abnormality within the liver parenchyma. A tiny hypodensity in the liver parenchyma is too small to characterize but is statistically most likely benign. No followup imaging is recommended. There is no evidence for gallstones, gallbladder wall thickening, or pericholecystic fluid. No intrahepatic or extrahepatic biliary dilation. Pancreas: No focal mass lesion. No dilatation of the main duct. No intraparenchymal cyst. No peripancreatic edema. Spleen: No splenomegaly.  No suspicious focal mass lesion. Adrenals/Urinary Tract: No adrenal nodule or mass. Right kidney surgically absent. Left kidney atrophic with multiple cystic lesions of varying size and attenuation. No overtly suspicious left renal mass. Multiple calcifications are noted in the left kidney. No hydroureter. Bladder is decompressed. Although decompressed state of the gallbladder accentuates wall thickness, there appears to be some associated extraperitoneal circumferential perivesical edema. Stomach/Bowel: Stomach is unremarkable. No gastric wall thickening. No evidence of outlet obstruction. Duodenum is normally positioned as is the ligament of Treitz. No small bowel obstruction. Small bowel loops in the low pelvis show mild wall thickening with congested  mesentery in interloop mesenteric fluid. Wall of the terminal ileum may be hyperenhancing appears ill-defined. The appendix is not well visualized, but there is no edema or inflammation in the region of the cecal tip to suggest appendicitis. No gross colonic mass. No colonic wall thickening. Vascular/Lymphatic: No abdominal aortic aneurysm. No abdominal aortic atherosclerotic calcification. There is no gastrohepatic or hepatoduodenal ligament lymphadenopathy. No retroperitoneal or mesenteric lymphadenopathy. No pelvic sidewall lymphadenopathy. Reproductive: The prostate gland and seminal vesicles are unremarkable. Other: Moderate to large volume free fluid identified in the abdomen and pelvis with associated intraperitoneal free gas. These findings are nonspecific in the setting of the patient's peritoneal dialysis catheter which is coiled in the central low pelvis. No overt peritoneal enhancement or thickening. Musculoskeletal: Diffuse bony changes compatible with chronic renal disease. Bilateral pars interarticularis defects noted at L5. IMPRESSION: 1. Moderate to large volume free fluid in the abdomen and pelvis with associated intraperitoneal free gas. These findings are nonspecific in the setting of the patient's peritoneal dialysis catheter which is coiled in the central low pelvis. No overt peritoneal enhancement or thickening. 2. Small bowel loops in the low pelvis show mild wall thickening with congested mesentery and interloop mesenteric fluid. Wall of the terminal ileum may be hyperenhancing and appears ill-defined. Imaging features may be related to infectious/inflammatory enteritis. Despite the lack of overt peritoneal thickening/enhancement, correlation for signs/symptoms of peritonitis recommended. 3. Although decompressed state of the gallbladder accentuates wall thickness, there appears to be some associated extraperitoneal circumferential perivesical edema. Correlation with urinalysis recommended  to exclude cystitis. 4. Right kidney surgically absent. Left kidney atrophic with multiple cystic lesions of varying size and attenuation. No overtly suspicious left renal mass. 5. Diffuse bony changes compatible with chronic renal disease. Electronically Signed   By: Camellia Candle M.D.   On: 02/15/2024 06:28    Scheduled Meds:  amLODipine   10 mg Oral Daily   And   irbesartan   150 mg Oral Daily   calcitRIOL   0.5 mcg Oral Daily   gentamicin  cream  1 Application Topical Daily   metoprolol succinate  25 mg Oral Daily   multivitamin  1 tablet Oral Daily   sucroferric oxyhydroxide  1,000 mg Oral BID   vancomycin  variable dose per unstable renal function (pharmacist dosing)   Does not apply See admin instructions   Continuous Infusions:  cefTAZidime (FORTAZ)  IV Stopped (02/15/24 1324)   dialysis solution 1.5% low-MG/low-CA       LOS: 1 day   Ivonne Mustache, MD [facility] P11/13/2025, 8:22 AM

## 2024-02-16 NOTE — Progress Notes (Signed)
 Micheal Bean Progress Note   Subjective:  Seen in room. Still with lower abd pain. Slow drain issues with PD overnight despite tPA to catheter yesterday. He reports no BM in 3 days which is likely contributing in addition to his peritonitis. At home uses Xphozah  for Phos control which causes regular BMs, unclear if formulary here. Will try to resume, also plan 1 lactulose dose today - may need to involve VVS if no improvement.  Objective Vitals:   02/16/24 0500 02/16/24 0730 02/16/24 0905 02/16/24 1000  BP:  128/78 (!) 136/92   Pulse:  93 (!) 113   Resp:  18 (!) 28 14  Temp:  99.2 F (37.3 C) 98.3 F (36.8 C)   TempSrc:  Oral    SpO2:  100% 98%   Weight: 70.2 kg     Height:       Physical Exam General: Well appearing man, NAD. Heart: RRR Lungs: CTAB Abdomen: slight distension, soft, tenderness in lower abdomen, PD cath in mid L abdomen Extremities: no LE edema Dialysis Access: PD cath  Additional Objective Labs: Basic Metabolic Panel: Recent Labs  Lab 02/15/24 0633 02/16/24 0402  NA 132* 133*  K 3.4* 3.2*  CL 92* 92*  CO2 24 23  GLUCOSE 87 88  BUN 68* 67*  CREATININE 19.01* 19.42*  CALCIUM  8.1* 8.9  PHOS  --  6.1*   Liver Function Tests: Recent Labs  Lab 02/15/24 0633 02/16/24 0402  AST 16  --   ALT 27  --   ALKPHOS 60  --   BILITOT 0.7  --   PROT 5.6*  --   ALBUMIN  1.7* 1.8*   Recent Labs  Lab 02/15/24 0633  LIPASE 66*   CBC: Recent Labs  Lab 02/15/24 0522 02/16/24 0402  WBC 13.0* 8.7  NEUTROABS 9.9*  --   HGB 7.1* 8.0*  HCT 22.6* 24.4*  MCV 93.8 87.8  PLT 558* 486*   Blood Culture    Component Value Date/Time   SDES PERITONEAL DIALYSATE 02/15/2024 0544   SPECREQUEST NONE 02/15/2024 0544   CULT  02/15/2024 0544    NO GROWTH < 24 HOURS Performed at [facility], [address], [place], [place] [address]    REPTSTATUS PENDING 02/15/2024 0544   Studies/Results: CT ABDOMEN PELVIS W CONTRAST Result Date:  02/15/2024 CLINICAL DATA:  Left lower quadrant abdominal pain. EXAM: CT ABDOMEN AND PELVIS WITH CONTRAST TECHNIQUE: Multidetector CT imaging of the abdomen and pelvis was performed using the standard protocol following bolus administration of intravenous contrast. RADIATION DOSE REDUCTION: This exam was performed according to the departmental dose-optimization program which includes automated exposure control, adjustment of the mA and/or kV according to patient size and/or use of iterative reconstruction technique. CONTRAST:  75mL OMNIPAQUE IOHEXOL 350 MG/ML SOLN COMPARISON:  11/13/2020 FINDINGS: Lower chest: Unremarkable. Hepatobiliary: No suspicious focal abnormality within the liver parenchyma. A tiny hypodensity in the liver parenchyma is too small to characterize but is statistically most likely benign. No followup imaging is recommended. There is no evidence for gallstones, gallbladder wall thickening, or pericholecystic fluid. No intrahepatic or extrahepatic biliary dilation. Pancreas: No focal mass lesion. No dilatation of the main duct. No intraparenchymal cyst. No peripancreatic edema. Spleen: No splenomegaly. No suspicious focal mass lesion. Adrenals/Urinary Tract: No adrenal nodule or mass. Right kidney surgically absent. Left kidney atrophic with multiple cystic lesions of varying size and attenuation. No overtly suspicious left renal mass. Multiple calcifications are noted in the left kidney. No hydroureter.  Bladder is decompressed. Although decompressed state of the gallbladder accentuates wall thickness, there appears to be some associated extraperitoneal circumferential perivesical edema. Stomach/Bowel: Stomach is unremarkable. No gastric wall thickening. No evidence of outlet obstruction. Duodenum is normally positioned as is the ligament of Treitz. No small bowel obstruction. Small bowel loops in the low pelvis show mild wall thickening with congested mesentery in interloop mesenteric fluid. Wall  of the terminal ileum may be hyperenhancing appears ill-defined. The appendix is not well visualized, but there is no edema or inflammation in the region of the cecal tip to suggest appendicitis. No gross colonic mass. No colonic wall thickening. Vascular/Lymphatic: No abdominal aortic aneurysm. No abdominal aortic atherosclerotic calcification. There is no gastrohepatic or hepatoduodenal ligament lymphadenopathy. No retroperitoneal or mesenteric lymphadenopathy. No pelvic sidewall lymphadenopathy. Reproductive: The prostate gland and seminal vesicles are unremarkable. Other: Moderate to large volume free fluid identified in the abdomen and pelvis with associated intraperitoneal free gas. These findings are nonspecific in the setting of the patient's peritoneal dialysis catheter which is coiled in the central low pelvis. No overt peritoneal enhancement or thickening. Musculoskeletal: Diffuse bony changes compatible with chronic renal disease. Bilateral pars interarticularis defects noted at L5. IMPRESSION: 1. Moderate to large volume free fluid in the abdomen and pelvis with associated intraperitoneal free gas. These findings are nonspecific in the setting of the patient's peritoneal dialysis catheter which is coiled in the central low pelvis. No overt peritoneal enhancement or thickening. 2. Small bowel loops in the low pelvis show mild wall thickening with congested mesentery and interloop mesenteric fluid. Wall of the terminal ileum may be hyperenhancing and appears ill-defined. Imaging features may be related to infectious/inflammatory enteritis. Despite the lack of overt peritoneal thickening/enhancement, correlation for signs/symptoms of peritonitis recommended. 3. Although decompressed state of the gallbladder accentuates wall thickness, there appears to be some associated extraperitoneal circumferential perivesical edema. Correlation with urinalysis recommended to exclude cystitis. 4. Right kidney surgically  absent. Left kidney atrophic with multiple cystic lesions of varying size and attenuation. No overtly suspicious left renal mass. 5. Diffuse bony changes compatible with chronic renal disease. Electronically Signed   By: Camellia Candle M.D.   On: 02/15/2024 06:28   Medications:  cefTAZidime (FORTAZ)  IV 1 g (02/16/24 0937)   dialysis solution 1.5% low-MG/low-CA      amLODipine   10 mg Oral Daily   And   irbesartan   150 mg Oral Daily   calcitRIOL   0.5 mcg Oral Daily   gentamicin  cream  1 Application Topical Daily   lactulose  20 g Oral Q1200   metoprolol succinate  25 mg Oral Daily   multivitamin  1 tablet Oral Daily   pantoprazole  40 mg Oral Daily   sucroferric oxyhydroxide  1,000 mg Oral BID   Tenapanor  HCl (CKD)  30 mg Oral BID   vancomycin  variable dose per unstable renal function (pharmacist dosing)   Does not apply See admin instructions    Dialysis Orders CCPD - 7d/week 6 exchanges, 2.8L fill volume, 1hr dwells. Last fill vol 2L EDW 73.5kg Mircera 200mcg last given 11/4  Assessment/Plan: Abd pain/peritonitis: Lower abdominal pain and slow flow issues with PD at home. PD fluid collected, started on IV Vanc/Ceftazidime -> cell count high (2470), c/w peritonitis. gram stain/Cx negative so far. This is his 1st episode - will treat with IV abx for 2 weeks, no need to remove cath at this time. ESRD: Issues with slow drain at home and here too - s/p tPA to  PD cath yesterday, more issues overnight. He feels like he is retaining fluid. Likely combination of peritonitis and also seems to have some constipation - will give lactulose today and try to restart the Xphozah  he takes at home. I am going to place a consult with VVS - low urgency- just so they can be aware of the situation, if ongoing issues today may need PD cath manipulation/surgery tomorrow. No indication for PD cath removal at this time. HTN/volume: BP stable, no LE edema.  Anemia of ESRD: Hgb 8 - not due for ESA yet Secondary  HPTH: Ca ok, Phos high - on velphoro  and Tenapanor  for phos - resuming here Nutrition: Alb low, adding protein supps   Izetta Boehringer, PA-C 02/16/2024, 10:50 AM  [employer]

## 2024-02-16 NOTE — Plan of Care (Signed)
   Problem: Education: Goal: Knowledge of General Education information will improve Description Including pain rating scale, medication(s)/side effects and non-pharmacologic comfort measures Outcome: Progressing

## 2024-02-17 ENCOUNTER — Inpatient Hospital Stay (HOSPITAL_COMMUNITY)

## 2024-02-17 DIAGNOSIS — K652 Spontaneous bacterial peritonitis: Secondary | ICD-10-CM | POA: Diagnosis not present

## 2024-02-17 HISTORY — PX: IR TUNNELED CENTRAL VENOUS CATH PLC W IMG: IMG1939

## 2024-02-17 LAB — CBC
HCT: 24.8 % — ABNORMAL LOW (ref 39.0–52.0)
Hemoglobin: 8 g/dL — ABNORMAL LOW (ref 13.0–17.0)
MCH: 28.7 pg (ref 26.0–34.0)
MCHC: 32.3 g/dL (ref 30.0–36.0)
MCV: 88.9 fL (ref 80.0–100.0)
Platelets: 464 K/uL — ABNORMAL HIGH (ref 150–400)
RBC: 2.79 MIL/uL — ABNORMAL LOW (ref 4.22–5.81)
RDW: 15.1 % (ref 11.5–15.5)
WBC: 10.2 K/uL (ref 4.0–10.5)
nRBC: 0 % (ref 0.0–0.2)

## 2024-02-17 LAB — RENAL FUNCTION PANEL
Albumin: 1.7 g/dL — ABNORMAL LOW (ref 3.5–5.0)
Anion gap: 15 (ref 5–15)
BUN: 69 mg/dL — ABNORMAL HIGH (ref 6–20)
CO2: 23 mmol/L (ref 22–32)
Calcium: 8.7 mg/dL — ABNORMAL LOW (ref 8.9–10.3)
Chloride: 94 mmol/L — ABNORMAL LOW (ref 98–111)
Creatinine, Ser: 20.32 mg/dL — ABNORMAL HIGH (ref 0.61–1.24)
GFR, Estimated: 3 mL/min — ABNORMAL LOW (ref >60)
Glucose, Bld: 90 mg/dL (ref 70–99)
Phosphorus: 5.6 mg/dL — ABNORMAL HIGH (ref 2.5–4.6)
Potassium: 3.2 mmol/L — ABNORMAL LOW (ref 3.5–5.1)
Sodium: 132 mmol/L — ABNORMAL LOW (ref 135–145)

## 2024-02-17 LAB — HEPATITIS B SURFACE ANTIGEN: Hepatitis B Surface Ag: NONREACTIVE

## 2024-02-17 MED ORDER — CHLORHEXIDINE GLUCONATE CLOTH 2 % EX PADS
6.0000 | MEDICATED_PAD | Freq: Every day | CUTANEOUS | Status: DC
  Administered 2024-02-17 – 2024-02-18 (×2): 6 via TOPICAL

## 2024-02-17 MED ORDER — POLYETHYLENE GLYCOL 3350 17 G PO PACK: 17.0000 g | PACK | Freq: Every day | ORAL | Status: DC

## 2024-02-17 MED ORDER — HEPARIN SODIUM (PORCINE) 1000 UNIT/ML IJ SOLN
INTRAMUSCULAR | Status: AC
  Filled 2024-02-17: qty 10

## 2024-02-17 MED ORDER — FENTANYL CITRATE (PF) 100 MCG/2ML IJ SOLN
INTRAMUSCULAR | Status: AC | PRN
  Administered 2024-02-17 (×2): 25 ug via INTRAVENOUS

## 2024-02-17 MED ORDER — LIDOCAINE-EPINEPHRINE 1 %-1:100000 IJ SOLN
INTRAMUSCULAR | Status: AC
  Filled 2024-02-17: qty 1

## 2024-02-17 MED ORDER — MIDAZOLAM HCL (PF) 2 MG/2ML IJ SOLN
INTRAMUSCULAR | Status: AC | PRN
  Administered 2024-02-17: 1 mg via INTRAVENOUS

## 2024-02-17 MED ORDER — FENTANYL CITRATE (PF) 100 MCG/2ML IJ SOLN
INTRAMUSCULAR | Status: AC
  Filled 2024-02-17: qty 2

## 2024-02-17 MED ORDER — POLYETHYLENE GLYCOL 3350 17 G PO PACK
17.0000 g | PACK | Freq: Every day | ORAL | Status: DC
  Administered 2024-02-18: 17 g via ORAL
  Filled 2024-02-17 (×2): qty 1

## 2024-02-17 MED ORDER — POTASSIUM CHLORIDE CRYS ER 20 MEQ PO TBCR
40.0000 meq | EXTENDED_RELEASE_TABLET | Freq: Once | ORAL | Status: AC
  Administered 2024-02-17: 40 meq via ORAL
  Filled 2024-02-17: qty 2

## 2024-02-17 MED ORDER — CEFAZOLIN SODIUM-DEXTROSE 2-4 GM/100ML-% IV SOLN: 2.0000 g | INTRAVENOUS | Status: DC

## 2024-02-17 MED ORDER — MIDAZOLAM HCL 2 MG/2ML IJ SOLN
INTRAMUSCULAR | Status: AC
  Filled 2024-02-17: qty 2

## 2024-02-17 NOTE — Progress Notes (Signed)
 Pt to require temporary in-center HD at d/c. Met with pt, pt's mother, and pt's sister at bedside. Introduced self and explained role. Pt and family agreeable to going to TCU 4x's a week at d/c until pt able to resume PD. Contacted TCU RN to discuss pt's situation. TCU able to accept pt and pt can start on Monday if d/c this weekend. Pt will need to arrive at 7:10 am for 7:30 am chair time. Met with pt and family at bedside to discuss appt time. All agreeable and info sheet provided to family as well. HD arrangements added to pt's AVS. Pt and family also advised to please call home therapy on call RN if pt d/c this weekend so staff will know to expect pt on Monday am. Phone number provided to family and placed on AVS as well. TCU RN aware that pt and family agreeable to appt and that pt may d/c this weekend and family to call. Update provided to nephrologist and renal PA. Will assist as needed.   Randine Mungo Dialysis Navigator [phone]

## 2024-02-17 NOTE — Progress Notes (Signed)
 Peritoneal Dialysis post treatment note  PD treatment ended.   Patient complain of pain. Per patient nothing is draining, screen said its been draining but my belly is still distended. on call md made aware. Manual drain done nothing came out. Pt asked to turn from side to side and ambulate.    PD exit site clean, dry and intact.   Patient is awake, oriented and in no acute distress. Report given to bedside nurse.  Total UF removed:  -[phone]    Lorrene Glisson RN [facility]

## 2024-02-17 NOTE — Progress Notes (Signed)
 Ok to give KCL 40meq x1 for k 3.4 per Dr. Jillian.   Sergio Batch, PharmD, BCIDP, AAHIVP, CPP Infectious Disease Pharmacist 02/17/2024 8:14 AM

## 2024-02-17 NOTE — Progress Notes (Signed)
 PROGRESS NOTE  Micheal Bean  FMW:[id number] DOB: 08-06-1985 DOA: 02/15/2024 PCP: Tanda Bleacher, MD   Brief Narrative: Patient is a 38 year old male with history of ESRD on peritoneal dialysis, anemia of chronic kidney disease, secondary hyperparathyroidism who presented with left lower quadrant abdominal pain.  Abdominal pain had been going on for 3 days, localized around his peritoneal dialysis catheter, he was managing with Tylenol .  He became unable to perform dialysis, experience shortness of breath and thus presented to the emergency department.  On presentation, he was in sinus tachycardia with normal blood pressure.  Lab work showed WC count of 13, hemoglobin of 7.1, potassium of 3.4, creatinine of 19, lipase of 66.  CT abdomen/pelvis showed moderate to large volume free fluid in the abdomen with associated intraperitoneal gas, small bowel loops in the lower pelvis show mild wall thickening with congested mesentery and interloop mesenteric fluid with findings concerning for possible infection.  Nephrology consulted for dialysis.  Peritoneal fluid analysis suggest SBP.  Peritoneal fluid culture sent: report pending.  Currently on broad spectrum antibiotics.  Nephrology planning to put a temporary dialysis catheter and for initiating hemodialysis and treating peritonitis with 2 weeks of IV antibiotics with dialysis  Assessment & Plan:  Principal Problem:   SBP (spontaneous bacterial peritonitis) (HCC) Active Problems:   Sepsis (HCC)   Anemia of chronic disease   End-stage renal disease on peritoneal dialysis (HCC)   Hypokalemia   Essential hypertension   Thrombocytosis  Sepsis secondary to spontaneous bacterial peritonitis: Presented with 3-day history of abdominal pain, unable to perform peritoneal dialysis.  Presented with leukocytosis, tachycardia.  Peritoneal fluid analysis concerning for peritonitis. Also showed PD cathter possibly coiled in the central low pelvis.  Follow-up  peritoneal fluid culture:NGTD.  Continue vancomycin , ceftazidime.   Abdomen remains mildly distended and tender on examination. Vascular surgery recommending PD catheter removal. Nephrology planning to put a temporary dialysis catheter and for initiating hemodialysis and treating peritonitis with 2 weeks of IV antibiotics with dialysis.  Nephrology planning to keep current peritoneal dialysis catheter and use it after finishing treatment for peritonitis.IR consulted  Anemia of chronic disease: Secondary to CKD.  Hemoglobin on presentation  in the range of 7.  Baseline hemoglobin around 9-10.  Denies any hematochezia or melena.  Continue Protonix  ESRD on PD : Nephrology following for dialysis.  Hyperphosphatemia: Continue phosphate binders as nephrology.  Currently he was not taking any.  Hypokalemia: Being supplemented and monitored  Hypertension: Currently blood pressure stable.  Continue home medications         DVT prophylaxis:hep [place]     Code Status: Full Code  Family Communication: Mom at bedside on 11/14  Patient status:Inpatient  Patient is from :Home  Anticipated discharge un:Ynfz  Estimated DC date:2-3 days, after clearance from nephrology and arrangement of outpatient dialysis center   Consultants: Nephrology, vascular surgery  Procedures: None yet  Antimicrobials:  Anti-infectives (From admission, onward)    Start     Dose/Rate Route Frequency Ordered Stop   02/17/24 1115  ceFAZolin  (ANCEF ) IVPB 2g/100 mL premix        2 g 200 mL/hr over 30 Minutes Intravenous To Radiology 02/17/24 1029 02/18/24 1115   02/15/24 1248  vancomycin  variable dose per unstable renal function (pharmacist dosing)         Does not apply See admin instructions 02/15/24 1248     02/15/24 1245  vancomycin  (VANCOREADY) IVPB 1750 mg/350 mL        1,750 mg  175 mL/hr over 120 Minutes Intravenous  Once 02/15/24 1233 02/15/24 1635   02/15/24 1215  cefTAZidime (FORTAZ) 1 g in sodium  chloride 0.9 % 100 mL IVPB        1 g 200 mL/hr over 30 Minutes Intravenous Every 24 hours 02/15/24 1202     02/15/24 1145  cefTRIAXone  (ROCEPHIN ) 2 g in sodium chloride  0.9 % 100 mL IVPB  Status:  Discontinued        2 g 200 mL/hr over 30 Minutes Intravenous Every 24 hours 02/15/24 1142 02/15/24 1151       Subjective: Patient seen and examined at bedside today.  Had multiple bowel movements since yesterday.  Abdomen is still mildly distended and tender.  But he feels better today.  Afebrile, no leukocytosis  Objective: Vitals:   02/16/24 2010 02/17/24 0442 02/17/24 0820 02/17/24 1003  BP: 125/81 (!) 142/93 (!) 141/97   Pulse: 97 (!) 107 (!) 109   Resp: 18 16    Temp: 98.2 F (36.8 C) 98.8 F (37.1 C) 98.6 F (37 C)   TempSrc:   Oral   SpO2: 100% 98% 99%   Weight:    78.8 kg  Height:        Intake/Output Summary (Last 24 hours) at 02/17/2024 1108 Last data filed at 02/17/2024 0145 Gross per 24 hour  Intake -4318.27 ml  Output --  Net -4318.27 ml   Filed Weights   02/15/24 1429 02/16/24 0500 02/17/24 1003  Weight: 70.1 kg 70.2 kg 78.8 kg    Examination:   General exam: Overall comfortable, not in distress HEENT: PERRL Respiratory system:  no wheezes or crackles  Cardiovascular system: Mild sinus tachycardia.  Gastrointestinal system: Abdomen is mildly distended, has mild generalized tenderness, peritoneal dialysis catheter Central nervous system: Alert and oriented Extremities: No edema, no clubbing ,no cyanosis Skin: No rashes, no ulcers,no icterus     Data Reviewed: I have personally reviewed following labs and imaging studies  CBC: Recent Labs  Lab 02/15/24 0522 02/16/24 0402 02/17/24 0422  WBC 13.0* 8.7 10.2  NEUTROABS 9.9*  --   --   HGB 7.1* 8.0* 8.0*  HCT 22.6* 24.4* 24.8*  MCV 93.8 87.8 88.9  PLT 558* 486* 464*   Basic Metabolic Panel: Recent Labs  Lab 02/15/24 0633 02/16/24 0402 02/17/24 0422  NA 132* 133* 132*  K 3.4* 3.2* 3.2*  CL  92* 92* 94*  CO2 24 23 23   GLUCOSE 87 88 90  BUN 68* 67* 69*  CREATININE 19.01* 19.42* 20.32*  CALCIUM  8.1* 8.9 8.7*  PHOS  --  6.1* 5.6*     Recent Results (from the past 240 hours)  Body fluid culture w Gram Stain     Status: None (Preliminary result)   Collection Time: 02/15/24  5:44 AM   Specimen: Peritoneal Dialysate; Body Fluid  Result Value Ref Range Status   Specimen Description PERITONEAL DIALYSATE  Final   Special Requests NONE  Final   Gram Stain   Final    RARE WBC PRESENT,BOTH PMN AND MONONUCLEAR NO ORGANISMS SEEN    Culture   Final    NO GROWTH 2 DAYS Performed at [facility], [address] [place], [place] [address]    Report Status PENDING  Incomplete  Culture, blood (single) w Reflex to ID Panel     Status: None (Preliminary result)   Collection Time: 02/16/24  4:02 AM   Specimen: BLOOD  Result Value Ref Range Status   Specimen  Description BLOOD BLOOD RIGHT ARM  Final   Special Requests   Final    BOTTLES DRAWN AEROBIC AND ANAEROBIC Blood Culture adequate volume   Culture   Final    NO GROWTH 1 DAY Performed at [facility], [address], [place], [place] [address]    Report Status PENDING  Incomplete     Radiology Studies: DG Abd 1 View Result Date: 02/17/2024 EXAM: 1 VIEW XRAY OF THE ABDOMEN 02/17/2024 04:45:00 AM COMPARISON: [id number] CLINICAL HISTORY: 355246 Abdominal pain [id number] Abdominal pain peritoneal dialysis catheter malfunction FINDINGS: LINES, TUBES AND DEVICES: Peritoneal dialysis catheter is coiled in the central pelvis, similar to prior. No evidence for discontinuity along the radiopaque portion of the catheter. BOWEL: Bowel gas pattern is nonspecific. SOFT TISSUES: No opaque urinary calculi. BONES: No acute osseous abnormality. IMPRESSION: 1. No acute abdominal abnormality identified. 2. Peritoneal dialysis catheter remains coiled in the central pelvis without radiopaque discontinuity, unchanged from prior. Electronically signed  by: Camellia Candle MD 02/17/2024 05:33 AM EST RP Workstation: [id number]    Scheduled Meds:  (feeding supplement) PROSource Plus  30 mL Oral BID BM   amLODipine   10 mg Oral Daily   And   irbesartan   150 mg Oral Daily   calcitRIOL   0.5 mcg Oral Daily   Chlorhexidine  Gluconate Cloth  6 each Topical Q0600   docusate sodium  100 mg Oral BID   gentamicin  cream  1 Application Topical Daily   lactulose  20 g Oral Q1200   metoprolol succinate  25 mg Oral Daily   multivitamin  1 tablet Oral Daily   pantoprazole  40 mg Oral Daily   polyethylene glycol  17 g Oral Daily   sucroferric oxyhydroxide  1,000 mg Oral BID   Tenapanor  HCl (CKD)  30 mg Oral BID   vancomycin  variable dose per unstable renal function (pharmacist dosing)   Does not apply See admin instructions   Continuous Infusions:   ceFAZolin  (ANCEF ) IV     cefTAZidime (FORTAZ)  IV 1 g (02/17/24 0956)   dialysis solution 1.5% low-MG/low-CA       LOS: 2 days   Ivonne Mustache, MD [facility] P11/14/2025, 11:08 AM

## 2024-02-17 NOTE — Care Management Important Message (Signed)
 Important Message  Patient Details  Name: Micheal Bean MRN: [id number] Date of Birth: [birth year]   Important Message Given:  Yes - Medicare IM     Claretta Deed 02/17/2024, 3:22 PM

## 2024-02-17 NOTE — Procedures (Signed)
 Interventional Radiology Procedure Note  Procedure: Tunneled hemodialysis catheter placement  Complications: None  Estimated Blood Loss: < 10 mL  Findings: RIJ access for placement of tunneled hemodialysis catheter.  Cordella DELENA Banner, MD

## 2024-02-17 NOTE — TOC CM/SW Note (Signed)
 Transition of Care [facility]) - Inpatient Brief Assessment   Patient Details  Name: Micheal Bean MRN: [id number] Date of Birth: February 16, 1986  [facility] of [facility]) CM/SW Contact:    Tom-Johnson, Harvest Muskrat, RN Phone Number: 02/17/2024, 2:05 PM   Clinical Narrative:  Patient presented to the ED with Lt Lower Abdominal pain and difficulty with Peritoneal Dialysis Catheter draining. CT Abdomen/Pelvis concerning for possible infection. Admitted with Spontaneous Bacterial Peritonitis, On IV abx.  Nephrology following, patient underwent temporary Tunneled Hemodialysis Catheter placement today 02/17/24 by IR to start iHD and to treat Peritonitis with 2 weeks of IV antibiotics with Dialysis. Clipping underway for outpatient HD temporarily until patient can resume PD at home.     CM spoke with patient at bedside about needs for post hospital transition. Patient states all his information are the same on file as he was recently at the hospital and there is nothing more for him to say. CM informed patient to call with needs.   Patient not Medically ready for discharge.  CM will continue to follow as patient progresses with care towards discharge.         Transition of Care Asessment: Insurance and Status: Insurance coverage has been reviewed Patient has primary care physician: Yes Home environment has been reviewed: Yes Prior level of function:: Independent Prior/Current Home Services: No current home services Social Drivers of Health Review: SDOH reviewed no interventions necessary Readmission risk has been reviewed: Yes Transition of care needs: no transition of care needs at this time

## 2024-02-17 NOTE — Progress Notes (Signed)
 [facility] Progress Note   Subjective:   Seen in room - parents at bedside. Able to have several BM yesterday afternoon/evening. Started PD last night - initially drained ok, but then after 1st cycle only drained 1mL and then got more fluid in without drainage - ended up +2L. KUB this AM without significant constipation. BUN/Cr higher this AM Discussed options - elected to have TDC placed and change to HD temporarily while treating peritonitis. Will leave PD cath in place. Try to use again after finishing abx course, but explained that he may end up needing it removed.  Objective Vitals:   02/16/24 2010 02/17/24 0442 02/17/24 0820 02/17/24 1003  BP: 125/81 (!) 142/93 (!) 141/97   Pulse: 97 (!) 107 (!) 109   Resp: 18 16    Temp: 98.2 F (36.8 C) 98.8 F (37.1 C) 98.6 F (37 C)   TempSrc:   Oral   SpO2: 100% 98% 99%   Weight:    78.8 kg  Height:       Physical Exam General: Well appearing man, NAD. Heart: RRR Lungs: CTAB Abdomen: slight distension, soft, tenderness in lower abdomen, PD cath in mid L abdomen Extremities: no LE edema Dialysis Access: PD cath  Additional Objective Labs: Basic Metabolic Panel: Recent Labs  Lab 02/15/24 0633 02/16/24 0402 02/17/24 0422  NA 132* 133* 132*  K 3.4* 3.2* 3.2*  CL 92* 92* 94*  CO2 24 23 23   GLUCOSE 87 88 90  BUN 68* 67* 69*  CREATININE 19.01* 19.42* 20.32*  CALCIUM  8.1* 8.9 8.7*  PHOS  --  6.1* 5.6*   Liver Function Tests: Recent Labs  Lab 02/15/24 0633 02/16/24 0402 02/17/24 0422  AST 16  --   --   ALT 27  --   --   ALKPHOS 60  --   --   BILITOT 0.7  --   --   PROT 5.6*  --   --   ALBUMIN  1.7* 1.8* 1.7*   Recent Labs  Lab 02/15/24 0633  LIPASE 66*   CBC: Recent Labs  Lab 02/15/24 0522 02/16/24 0402 02/17/24 0422  WBC 13.0* 8.7 10.2  NEUTROABS 9.9*  --   --   HGB 7.1* 8.0* 8.0*  HCT 22.6* 24.4* 24.8*  MCV 93.8 87.8 88.9  PLT 558* 486* 464*   Blood Culture    Component Value  Date/Time   SDES BLOOD BLOOD RIGHT ARM 02/16/2024 0402   SPECREQUEST  02/16/2024 0402    BOTTLES DRAWN AEROBIC AND ANAEROBIC Blood Culture adequate volume   CULT  02/16/2024 0402    NO GROWTH 1 DAY Performed at [facility], [address] [place], [place] [address]    REPTSTATUS PENDING 02/16/2024 0402   Studies/Results: DG Abd 1 View Result Date: 02/17/2024 EXAM: 1 VIEW XRAY OF THE ABDOMEN 02/17/2024 04:45:00 AM COMPARISON: [id number] CLINICAL HISTORY: 355246 Abdominal pain [id number] Abdominal pain peritoneal dialysis catheter malfunction FINDINGS: LINES, TUBES AND DEVICES: Peritoneal dialysis catheter is coiled in the central pelvis, similar to prior. No evidence for discontinuity along the radiopaque portion of the catheter. BOWEL: Bowel gas pattern is nonspecific. SOFT TISSUES: No opaque urinary calculi. BONES: No acute osseous abnormality. IMPRESSION: 1. No acute abdominal abnormality identified. 2. Peritoneal dialysis catheter remains coiled in the central pelvis without radiopaque discontinuity, unchanged from prior. Electronically signed by: Camellia Candle MD 02/17/2024 05:33 AM EST RP Workstation: [id number]   Medications:   ceFAZolin  (ANCEF ) IV     cefTAZidime (FORTAZ)  IV 1 g (02/17/24 0956)   dialysis solution 1.5% low-MG/low-CA      (feeding supplement) PROSource Plus  30 mL Oral BID BM   amLODipine   10 mg Oral Daily   And   irbesartan   150 mg Oral Daily   calcitRIOL   0.5 mcg Oral Daily   docusate sodium  100 mg Oral BID   gentamicin  cream  1 Application Topical Daily   lactulose  20 g Oral Q1200   metoprolol succinate  25 mg Oral Daily   multivitamin  1 tablet Oral Daily   pantoprazole  40 mg Oral Daily   polyethylene glycol  17 g Oral Daily   sucroferric oxyhydroxide  1,000 mg Oral BID   Tenapanor  HCl (CKD)  30 mg Oral BID   vancomycin  variable dose per unstable renal function (pharmacist dosing)   Does not apply See admin instructions   Dialysis Orders CCPD -  7d/week 6 exchanges, 2.8L fill volume, 1hr dwells. Last fill vol 2L EDW 73.5kg Mircera 200mcg last given 11/4   Assessment/Plan: Abd pain/peritonitis: Lower abdominal pain and slow flow issues with PD at home. PD fluid collected, started on IV Vanc/Ceftazidime -> cell count high (2470), c/w peritonitis. Gram stain/Cx negative so far. This is his 1st episode - will treat with IV abx for 2 weeks, no need to remove cath at this time. ESRD: Issues with slow drain at home and here too - s/p tPA to PD cath and given laxatives without improvement. Will place [facility] today and proceed with HD today, plan to continue HD until finished with IV abx and then see if PD cath can be used again/salvaged. Will work on [employer] for temporary outpatient HD.  HTN/volume: BP higher - retaining fluid, should improve with HD as above. Anemia of ESRD: Hgb 8 - not due for ESA yet Secondary HPTH: Ca ok, Phos high - continue Velphoro , Xphozah  not formulary here. Nutrition: Alb low, continue protein supps   Izetta Boehringer, PA-C 02/17/2024, 10:45 AM  [employer]

## 2024-02-17 NOTE — Discharge Planning (Signed)
  [facility] Dialysis Discharge Orders   Patient Name: Micheal Bean  Admission Date: 02/15/2024 Discharge Date: 02/17/24 - tentatively Dialysis Unit: Prev on PD -> to HD temporarily due to PD cath dysfunction during peritonitis episode  Admitting Diagnosis: Peritonitis ESRD PD cath dysfunction  PMHx: ESRD HTN  Discharge Labs: Basic Metabolic Panel: Recent Labs  Lab 02/15/24 0633 02/16/24 0402 02/17/24 0422  NA 132* 133* 132*  K 3.4* 3.2* 3.2*  CL 92* 92* 94*  CO2 24 23 23   GLUCOSE 87 88 90  BUN 68* 67* 69*  CREATININE 19.01* 19.42* 20.32*  CALCIUM  8.1* 8.9 8.7*  PHOS  --  6.1* 5.6*   CBC: Recent Labs  Lab 02/15/24 0522 02/16/24 0402 02/17/24 0422  WBC 13.0* 8.7 10.2  NEUTROABS 9.9*  --   --   HGB 7.1* 8.0* 8.0*  HCT 22.6* 24.4* 24.8*  MCV 93.8 87.8 88.9  PLT 558* 486* 464*   Dialysis Orders: TCU [facility] - MTuThF 4d/week 3hr 180 dialyzer BFR 400 DFR A1.5 EDW 76kg 3K/2.5Ca bath TDC no UF or Na profile  Dialysis Meds: Heparin  2000 unit bolus + TDC locks Mircera 200mcg IV q 2 weeks - to start 11/18 Continue daily calcitriol  for now (do not covert to in-center)  IV Abx: Vancomycin  750mg  IV q HD x 2 weeks (end date: 02/29/24) Ceftazidime 2g IV q HD x 2 weeks (end date: 02/29/24)  Other orders: - Monthly labs on admit, weekly K levels - Try to flush PD cath at 1 week mark - Try to use PD cath after done with abx. If not working, will need PD cath revision at that time.    [name], PA-C 02/17/2024, 2:20 PM  [employer] [phone]

## 2024-02-17 NOTE — Consult Note (Signed)
 Chief Complaint: Peritoneal dialysis catheter malfunction, ongoing need for dialysis - IR consulted for tunneled hemodialysis catheter placement  Referring Provider(s): Stovall, Lamarr SAUNDERS, PA-C   Supervising Physician: Vanice Revel  Patient Status: [facility] - In-pt  History of Present Illness: Micheal Bean is a 38 y.o. male with hx of ESRD who has been on PD at home for the last 3 years, anemia of chronic disease, secondary hyperparathyroidism. He presented to the ED on 02/15/24 with 3 days of left abdominal pain around the peritoneal dialysis catheter, along with being unable to drain from the catheter since Monday 11/10. Nephrology was able to get some fluid to come from catheter, which was cloudy and suspect peritonitis. He has continued to have slow draining issues with his PD cath in the hospital, despite tPA attempt to clear catheter. Due to ongoing need for dialysis, IR now consulted for tunneled hemodialysis catheter placement.  Today patient with persistent abdominal discomfort. No additional complaint. Has been NPO since midnight.   Patient is Full Code  Past Medical History:  Diagnosis Date   Anemia    ESRD (end stage renal disease) (HCC)    PD at home   History of blood transfusion 11/2020   4 units   History of blood transfusion    Seizure (HCC)    [facility]    Past Surgical History:  Procedure Laterality Date   AV FISTULA PLACEMENT Right 03/02/2021   Procedure: RIGHT ARM Radio-Cephalic  ARTERIOVENOUS (AV) FISTULA CREATION;  Surgeon: Lanis Fonda BRAVO, MD;  Location: [facility] OR;  Service: Vascular;  Laterality: Right;   CAPD INSERTION N/A 05/20/2021   Procedure: LAPAROSCOPIC INSERTION CONTINUOUS AMBULATORY PERITONEAL DIALYSIS  CATHETER WITH POSSIBLE OMENTOPEXY;  Surgeon: Magda Debby SAILOR, MD;  Location: [facility] OR;  Service: Vascular;  Laterality: N/A;   CAPD INSERTION N/A 11/27/2023   Procedure: LAPAROSCOPIC INSERTION CONTINUOUS AMBULATORY PERITONEAL DIALYSIS   (CAPD) CATHETER;  Surgeon: Serene Gaile ORN, MD;  Location: [facility] OR;  Service: Vascular;  Laterality: N/A;   IR FLUORO GUIDE CV LINE RIGHT  11/18/2020   IR US  GUIDE VASC ACCESS RIGHT  11/18/2020   NEPHRECTOMY     childhhod, RT side   PARATHYROIDECTOMY Left 05/31/2022   Procedure: 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  Medications: Prior to Admission medications   Medication Sig Start Date End Date Taking? Authorizing Provider  acetaminophen  (TYLENOL ) 500 MG tablet Take 1,000 mg by mouth daily as needed for mild pain (pain score 1-3) or moderate pain (pain score 4-6).   Yes [provider]  amLODipine -valsartan  (EXFORGE ) 10-160 MG tablet Take 1 tablet by mouth daily. 11/20/23  Yes [provider]  calcitRIOL  (ROCALTROL ) 0.5 MCG capsule Take 0.5 mcg by mouth daily.   Yes [provider]  gentamicin  cream (GARAMYCIN ) 0.1 % Apply 1 Application topically daily. When changing his dressing 05/21/21  Yes [provider]  metoprolol succinate (TOPROL-XL) 25 MG 24 hr tablet Take 25 mg by mouth daily. 11/30/23  Yes [provider]  multivitamin (RENA-VIT) TABS tablet TAKE 1 TABLET BY MOUTH EVERYDAY AT BEDTIME Patient taking differently: Take 1 tablet by mouth daily. 06/08/21  Yes Tanda Bleacher, MD  Tenapanor  HCl, CKD, (XPHOZAH ) 30 MG TABS Take 30 mg by mouth daily as needed (constipation).   Yes [provider]  VELPHORO  500 MG chewable tablet Chew 1,000 mg by mouth in the morning and at bedtime. 11/07/23  Yes [provider]  Calcium  Acetate 667 MG TABS Take 667-1,334 mg by mouth See admin  instructions. Take 2 tablets three times a day with each meal. Take 1 tablet with each snack. Patient not taking: Reported on 02/15/2024    [provider]  sevelamer  carbonate (RENVELA ) 800 MG tablet Take 3,200 mg by mouth 3 (three) times daily with meals. Patient not taking: Reported on 02/15/2024 08/03/23   [provider]     Family History  Problem Relation Age of Onset   Hypertension Mother    Kidney disease Maternal Grandmother     Social History   Socioeconomic History   Marital status: Single    Spouse name: Not on file   Number of children: 2   Years of education: Not on file   Highest education level: Not on file  Occupational History   Not on file  Tobacco Use   Smoking status: Never   Smokeless tobacco: Never  Vaping Use   Vaping status: Never Used  Substance and Sexual Activity   Alcohol use: Not Currently   Drug use: Not Currently   Sexual activity: Not Currently  Other Topics Concern   Not on file  Social History Narrative   Not on file   Social Drivers of Health   Financial Resource Strain: Not on file  Food Insecurity: No Food Insecurity (02/15/2024)   Hunger Vital Sign    Worried About Running Out of Food in the Last Year: Never true    Ran Out of Food in the Last Year: Never true  Transportation Needs: No Transportation Needs (02/15/2024)   PRAPARE - Administrator, Civil Service (Medical): No    Lack of Transportation (Non-Medical): No  Physical Activity: Not on file  Stress: Not on file  Social Connections: Not on file     Review of Systems: A 12 point ROS discussed and pertinent positives are indicated in the HPI above.  All other systems are negative.  Vital Signs: BP (!) 141/97 (BP Location: Left Arm)   Pulse (!) 109   Temp 98.6 F (37 C) (Oral)   Resp 16   Ht 6' 3 (1.905 m)   Wt 173 lb 12.8 oz (78.8 kg)   SpO2 99%   BMI 21.72 kg/m   Advance Care Plan: No documents on file  Physical Exam Vitals  and nursing note reviewed.  Constitutional:      General: He is not in acute distress. HENT:     Mouth/Throat:     Mouth: Mucous membranes are moist.     Pharynx: Oropharynx is clear.  Cardiovascular:     Rate and Rhythm: Normal rate and regular rhythm.  Pulmonary:     Effort: Pulmonary effort is normal.     Breath sounds: Normal breath sounds.  Abdominal:     Palpations: Abdomen is soft.     Tenderness: There is abdominal tenderness (mild general ttp, worse in LLQ near PD catheter site).     Comments: PD cath in left lower abd. Overlying abd is unremarkable. No erythema, warmth, drainage  Musculoskeletal:     Right lower leg: No edema.     Left lower leg: No edema.  Skin:  General: Skin is warm and dry.  Neurological:     Mental Status: He is alert and oriented to person, place, and time. Mental status is at baseline.     Imaging: DG Abd 1 View Result Date: 02/17/2024 EXAM: 1 VIEW XRAY OF THE ABDOMEN 02/17/2024 04:45:00 AM COMPARISON: [id number] CLINICAL HISTORY: [id number] Abdominal pain [id number] Abdominal pain peritoneal dialysis catheter malfunction FINDINGS: LINES, TUBES AND DEVICES: Peritoneal dialysis catheter is coiled in the central pelvis, similar to prior. No evidence for discontinuity along the radiopaque portion of the catheter. BOWEL: Bowel gas pattern is nonspecific. SOFT TISSUES: No opaque urinary calculi. BONES: No acute osseous abnormality. IMPRESSION: 1. No acute abdominal abnormality identified. 2. Peritoneal dialysis catheter remains coiled in the central pelvis without radiopaque discontinuity, unchanged from prior. Electronically signed by: Camellia Candle MD 02/17/2024 05:33 AM EST RP Workstation: [id number]   CT ABDOMEN PELVIS W CONTRAST Result Date: 02/15/2024 CLINICAL DATA:  Left lower quadrant abdominal pain. EXAM: CT ABDOMEN AND PELVIS WITH CONTRAST TECHNIQUE: Multidetector CT imaging of the abdomen and pelvis was performed using the standard protocol following bolus  administration of intravenous contrast. RADIATION DOSE REDUCTION: This exam was performed according to the departmental dose-optimization program which includes automated exposure control, adjustment of the mA and/or kV according to patient size and/or use of iterative reconstruction technique. CONTRAST:  75mL OMNIPAQUE IOHEXOL 350 MG/ML SOLN COMPARISON:  11/13/2020 FINDINGS: Lower chest: Unremarkable. Hepatobiliary: No suspicious focal abnormality within the liver parenchyma. A tiny hypodensity in the liver parenchyma is too small to characterize but is statistically most likely benign. No followup imaging is recommended. There is no evidence for gallstones, gallbladder wall thickening, or pericholecystic fluid. No intrahepatic or extrahepatic biliary dilation. Pancreas: No focal mass lesion. No dilatation of the main duct. No intraparenchymal cyst. No peripancreatic edema. Spleen: No splenomegaly. No suspicious focal mass lesion. Adrenals/Urinary Tract: No adrenal nodule or mass. Right kidney surgically absent. Left kidney atrophic with multiple cystic lesions of varying size and attenuation. No overtly suspicious left renal mass. Multiple calcifications are noted in the left kidney. No hydroureter. Bladder is decompressed. Although decompressed state of the gallbladder accentuates wall thickness, there appears to be some associated extraperitoneal circumferential perivesical edema. Stomach/Bowel: Stomach is unremarkable. No gastric wall thickening. No evidence of outlet obstruction. Duodenum is normally positioned as is the ligament of Treitz. No small bowel obstruction. Small bowel loops in the low pelvis show mild wall thickening with congested mesentery in interloop mesenteric fluid. Wall of the terminal ileum may be hyperenhancing appears ill-defined. The appendix is not well visualized, but there is no edema or inflammation in the region of the cecal tip to suggest appendicitis. No gross colonic mass. No  colonic wall thickening. Vascular/Lymphatic: No abdominal aortic aneurysm. No abdominal aortic atherosclerotic calcification. There is no gastrohepatic or hepatoduodenal ligament lymphadenopathy. No retroperitoneal or mesenteric lymphadenopathy. No pelvic sidewall lymphadenopathy. Reproductive: The prostate gland and seminal vesicles are unremarkable. Other: Moderate to large volume free fluid identified in the abdomen and pelvis with associated intraperitoneal free gas. These findings are nonspecific in the setting of the patient's peritoneal dialysis catheter which is coiled in the central low pelvis. No overt peritoneal enhancement or thickening. Musculoskeletal: Diffuse bony changes compatible with chronic renal disease. Bilateral pars interarticularis defects noted at L5. IMPRESSION: 1. Moderate to large volume free fluid in the abdomen and pelvis with associated intraperitoneal free gas. These findings are nonspecific in the setting of the patient's peritoneal dialysis catheter which is coiled in the central low  pelvis. No overt peritoneal enhancement or thickening. 2. Small bowel loops in the low pelvis show mild wall thickening with congested mesentery and interloop mesenteric fluid. Wall of the terminal ileum may be hyperenhancing and appears ill-defined. Imaging features may be related to infectious/inflammatory enteritis. Despite the lack of overt peritoneal thickening/enhancement, correlation for signs/symptoms of peritonitis recommended. 3. Although decompressed state of the gallbladder accentuates wall thickness, there appears to be some associated extraperitoneal circumferential perivesical edema. Correlation with urinalysis recommended to exclude cystitis. 4. Right kidney surgically absent. Left kidney atrophic with multiple cystic lesions of varying size and attenuation. No overtly suspicious left renal mass. 5. Diffuse bony changes compatible with chronic renal disease. Electronically Signed   By:  Camellia Candle M.D.   On: 02/15/2024 06:28    Labs:  CBC: Recent Labs    12/04/23 0808 02/15/24 0522 02/16/24 0402 02/17/24 0422  WBC 8.7 13.0* 8.7 10.2  HGB 9.6* 7.1* 8.0* 8.0*  HCT 30.3* 22.6* 24.4* 24.8*  PLT 600* 558* 486* 464*    COAGS: Recent Labs    05/13/23 0601  INR 1.2  APTT 28    BMP: Recent Labs    12/04/23 0808 02/15/24 0633 02/16/24 0402 02/17/24 0422  NA 125* 132* 133* 132*  K 4.3 3.4* 3.2* 3.2*  CL 89* 92* 92* 94*  CO2 20* 24 23 23   GLUCOSE 99 87 88 90  BUN 62* 68* 67* 69*  CALCIUM  8.3* 8.1* 8.9 8.7*  CREATININE 20.65* 19.01* 19.42* 20.32*  GFRNONAA 3* 3* 3* 3*    LIVER FUNCTION TESTS: Recent Labs    05/13/23 0601 11/26/23 0103 11/27/23 0353 11/29/23 0451 02/15/24 0633 02/16/24 0402 02/17/24 0422  BILITOT 0.6 0.5  --   --  0.7  --   --   AST 12* 13*  --   --  16  --   --   ALT 14 11  --   --  27  --   --   ALKPHOS 59 73  --   --  60  --   --   PROT 6.0* 6.5  --   --  5.6*  --   --   ALBUMIN  2.8* 2.5*   < > 1.8* 1.7* 1.8* 1.7*   < > = values in this interval not displayed.    TUMOR MARKERS: No results for input(s): AFPTM, CEA, CA199, CHROMGRNA in the last 8760 hours.  Assessment and Plan:  Micheal Bean is a 38 y.o. male with hx of ESRD who has been on PD at home for the last 3 years, anemia of chronic disease, secondary hyperparathyroidism. He presented to the ED on 02/15/24 with 3 days of left abdominal pain around the peritoneal dialysis catheter, along with being unable to drain from the catheter since Monday 11/10. Nephrology was able to get some fluid to come from catheter, which was cloudy and suspect peritonitis. He has continued to have slow draining issues with his PD cath in the hospital, despite tPA attempt to clear catheter. Due to ongoing need for dialysis, IR now consulted for tunneled hemodialysis catheter placement.  Today patient with persistent abdominal discomfort. No additional complaint. Has been NPO  since midnight.  Risks and benefits discussed with the patient including, but not limited to bleeding, infection, vascular injury, pneumothorax which may require chest tube placement, air embolism or even death. All of the patient's questions were answered, patient is agreeable to proceed. Consent signed and in chart.   Thank you for allowing  our service to participate in Micheal Bean 's care.  Electronically Signed: Kimble VEAR Clas, PA-C   02/17/2024, 10:33 AM      I spent a total of 20 Minutes    in face to face in clinical consultation, greater than 50% of which was counseling/coordinating care for tunneled hemodialysis catheter placement

## 2024-02-18 ENCOUNTER — Other Ambulatory Visit (HOSPITAL_COMMUNITY): Payer: Self-pay

## 2024-02-18 DIAGNOSIS — K652 Spontaneous bacterial peritonitis: Secondary | ICD-10-CM | POA: Diagnosis not present

## 2024-02-18 LAB — CBC
HCT: 22.8 % — ABNORMAL LOW (ref 39.0–52.0)
Hemoglobin: 7.5 g/dL — ABNORMAL LOW (ref 13.0–17.0)
MCH: 29.8 pg (ref 26.0–34.0)
MCHC: 32.9 g/dL (ref 30.0–36.0)
MCV: 90.5 fL (ref 80.0–100.0)
Platelets: 444 K/uL — ABNORMAL HIGH (ref 150–400)
RBC: 2.52 MIL/uL — ABNORMAL LOW (ref 4.22–5.81)
RDW: 15.1 % (ref 11.5–15.5)
WBC: 11.2 K/uL — ABNORMAL HIGH (ref 4.0–10.5)
nRBC: 0 % (ref 0.0–0.2)

## 2024-02-18 LAB — BODY FLUID CULTURE W GRAM STAIN: Culture: NO GROWTH

## 2024-02-18 LAB — HEPATITIS B SURFACE ANTIBODY, QUANTITATIVE: Hep B S AB Quant (Post): 13.4 m[IU]/mL

## 2024-02-18 LAB — RENAL FUNCTION PANEL
Albumin: <1.5 g/dL — ABNORMAL LOW (ref 3.5–5.0)
Anion gap: 18 — ABNORMAL HIGH (ref 5–15)
BUN: 76 mg/dL — ABNORMAL HIGH (ref 6–20)
CO2: 20 mmol/L — ABNORMAL LOW (ref 22–32)
Calcium: 8.6 mg/dL — ABNORMAL LOW (ref 8.9–10.3)
Chloride: 94 mmol/L — ABNORMAL LOW (ref 98–111)
Creatinine, Ser: 21.11 mg/dL — ABNORMAL HIGH (ref 0.61–1.24)
GFR, Estimated: 3 mL/min — ABNORMAL LOW (ref >60)
Glucose, Bld: 85 mg/dL (ref 70–99)
Phosphorus: 5.7 mg/dL — ABNORMAL HIGH (ref 2.5–4.6)
Potassium: 3.6 mmol/L (ref 3.5–5.1)
Sodium: 132 mmol/L — ABNORMAL LOW (ref 135–145)

## 2024-02-18 MED ORDER — SODIUM CHLORIDE 0.9 % IV SOLN
1.0000 g | Freq: Once | INTRAVENOUS | Status: AC
  Administered 2024-02-18: 1 g via INTRAVENOUS
  Filled 2024-02-18: qty 1

## 2024-02-18 MED ORDER — HEPARIN SODIUM (PORCINE) 1000 UNIT/ML IJ SOLN
INTRAMUSCULAR | Status: AC
  Filled 2024-02-18: qty 4

## 2024-02-18 MED ORDER — VANCOMYCIN HCL 750 MG/150ML IV SOLN: 750.0000 mg | INTRAVENOUS | Status: DC

## 2024-02-18 MED ORDER — SODIUM CHLORIDE 0.9 % IV SOLN: 2.0000 g | INTRAVENOUS | Status: DC

## 2024-02-18 MED ORDER — PANTOPRAZOLE SODIUM 40 MG PO TBEC
40.0000 mg | DELAYED_RELEASE_TABLET | Freq: Every day | ORAL | 0 refills | Status: AC
  Filled 2024-02-18: qty 30, 30d supply, fill #0

## 2024-02-18 MED ORDER — VANCOMYCIN HCL 750 MG/150ML IV SOLN: 750.0000 mg | INTRAVENOUS | Status: AC

## 2024-02-18 MED ORDER — POLYETHYLENE GLYCOL 3350 17 GM/SCOOP PO POWD
17.0000 g | Freq: Every day | ORAL | 0 refills | Status: AC
  Filled 2024-02-18: qty 238, 14d supply, fill #0

## 2024-02-18 MED ORDER — VANCOMYCIN HCL 750 MG/150ML IV SOLN
750.0000 mg | Freq: Once | INTRAVENOUS | Status: AC
  Administered 2024-02-18: 750 mg via INTRAVENOUS
  Filled 2024-02-18: qty 150

## 2024-02-18 MED ORDER — SODIUM CHLORIDE 0.9 % IV SOLN: 2.0000 g | INTRAVENOUS | Status: AC

## 2024-02-18 NOTE — Progress Notes (Signed)
 [facility] Progress Note   Subjective:   Had first HD overnight and reports it went well. Denies SOB, CP, dizziness, nausea. Outpatient HD has been arranged at [facility].  Objective Vitals:   02/18/24 0400 02/18/24 0420 02/18/24 0511 02/18/24 0727  BP: (!) 136/90 (!) 140/98 (!) 140/95 136/88  Pulse: (!) 113 (!) 108  (!) 117  Resp: (!) 25 (!) 27 18 19   Temp:  98.8 F (37.1 C) 98.9 F (37.2 C) 98.5 F (36.9 C)  TempSrc:  Oral  Oral  SpO2: (!) 71% 98% 98% 97%  Weight:      Height:       Physical Exam General: Well appearing male, alert, NAD Heart: RRR, no murmurs Lungs: CTA bilaterally, respirations unlabored Abdomen: Soft, non-distended, +BS Extremities: no edema b/l lower extremities Dialysis Access:  PD cath in lower abdomen, internal jugular [facility]  Additional Objective Labs: Basic Metabolic Panel: Recent Labs  Lab 02/16/24 0402 02/17/24 0422 02/18/24 0030  NA 133* 132* 132*  K 3.2* 3.2* 3.6  CL 92* 94* 94*  CO2 23 23 20*  GLUCOSE 88 90 85  BUN 67* 69* 76*  CREATININE 19.42* 20.32* 21.11*  CALCIUM  8.9 8.7* 8.6*  PHOS 6.1* 5.6* 5.7*   Liver Function Tests: Recent Labs  Lab 02/15/24 0633 02/16/24 0402 02/17/24 0422 02/18/24 0030  AST 16  --   --   --   ALT 27  --   --   --   ALKPHOS 60  --   --   --   BILITOT 0.7  --   --   --   PROT 5.6*  --   --   --   ALBUMIN  1.7* 1.8* 1.7* <1.5*   Recent Labs  Lab 02/15/24 0633  LIPASE 66*   CBC: Recent Labs  Lab 02/15/24 0522 02/16/24 0402 02/17/24 0422 02/18/24 0030  WBC 13.0* 8.7 10.2 11.2*  NEUTROABS 9.9*  --   --   --   HGB 7.1* 8.0* 8.0* 7.5*  HCT 22.6* 24.4* 24.8* 22.8*  MCV 93.8 87.8 88.9 90.5  PLT 558* 486* 464* 444*   Blood Culture    Component Value Date/Time   SDES BLOOD BLOOD RIGHT ARM 02/16/2024 0402   SPECREQUEST  02/16/2024 0402    BOTTLES DRAWN AEROBIC AND ANAEROBIC Blood Culture adequate volume   CULT  02/16/2024 0402    NO GROWTH 2 DAYS Performed at [facility], [address], [place], [place] [address]    REPTSTATUS PENDING 02/16/2024 0402    Cardiac Enzymes: No results for input(s): CKTOTAL, CKMB, CKMBINDEX, TROPONINI in the last 168 hours. CBG: No results for input(s): GLUCAP in the last 168 hours. Iron Studies: No results for input(s): IRON, TIBC, TRANSFERRIN, FERRITIN in the last 72 hours. @lablastinr3 @ Studies/Results: IR TUNNELED CENTRAL VENOUS CATH PLC W IMG Result Date: 02/17/2024 INDICATION: Chronic renal failure EXAM: TUNNELED tunneled hemodialysis catheter ULTRASOUND AND FLUOROSCOPIC GUIDANCE ANESTHESIA/SEDATION: Moderate (conscious) sedation was employed during this procedure. A total of Versed  1 mg and Fentanyl  50 mcg was administered intravenously by the radiology nurse. Total intra-service moderate Sedation Time: 15 minutes. The patient's level of consciousness and vital signs were monitored continuously by radiology nursing throughout the procedure under my direct supervision. FLUOROSCOPY: Radiation Exposure Index (as provided by the fluoroscopic device): 1 mGy Kerma COMPLICATIONS: None immediate. PROCEDURE: Informed written consent was obtained from the patient after a discussion of the risks, benefits, and alternatives to treatment. Questions regarding the procedure were encouraged  and answered. The right neck and chest were prepped with chlorhexidine  in a sterile fashion, and a sterile drape was applied covering the operative field. Maximum barrier sterile technique with sterile gowns and gloves were used for the procedure. A timeout was performed prior to the initiation of the procedure. After creating a small venotomy incision, a micropuncture kit was utilized to access the right internal jugular vein under direct, real-time ultrasound guidance after the overlying soft tissues were anesthetized with 1% lidocaine  with epinephrine . Ultrasound image documentation was performed. The microwire was kinked to measure  appropriate catheter length. The micropuncture sheath was exchanged for a peel-away sheath over a guidewire. A 19 cm tunneled palindrome hemodialysis catheter was tunneled in a retrograde fashion from the anterior chest wall to the venotomy incision. The catheter was then placed through the peel-away sheath with tip ultimately positioned at the superior caval-atrial junction. Final catheter positioning was confirmed and documented with a spot radiographic image. The catheter aspirates and flushes normally. The catheter was flushed with appropriate volume heparin  dwells. The catheter exit site was secured with a 0-Prolene retention suture. The venotomy incision was closed with an interrupted 4-0 Vicryl, Dermabond and Steri-strips. Dressings were applied. The patient tolerated the procedure well without immediate post procedural complication. FINDINGS: After catheter placement, the tip lies within the superior cavoatrial junction. The catheter aspirates and flushes normally and is ready for immediate use. IMPRESSION: Successful placement of a 19 cm tunneled palindrome hemodialysis cathetervia the right internal jugular vein with tip terminating at the superior caval atrial junction. The catheter is ready for immediate use. Electronically Signed   By: Cordella Banner   On: 02/17/2024 14:47   DG Abd 1 View Result Date: 02/17/2024 EXAM: 1 VIEW XRAY OF THE ABDOMEN 02/17/2024 04:45:00 AM COMPARISON: [id number] CLINICAL HISTORY: [id number] Abdominal pain [id number] Abdominal pain peritoneal dialysis catheter malfunction FINDINGS: LINES, TUBES AND DEVICES: Peritoneal dialysis catheter is coiled in the central pelvis, similar to prior. No evidence for discontinuity along the radiopaque portion of the catheter. BOWEL: Bowel gas pattern is nonspecific. SOFT TISSUES: No opaque urinary calculi. BONES: No acute osseous abnormality. IMPRESSION: 1. No acute abdominal abnormality identified. 2. Peritoneal dialysis catheter remains coiled in  the central pelvis without radiopaque discontinuity, unchanged from prior. Electronically signed by: Camellia Candle MD 02/17/2024 05:33 AM EST RP Workstation: [id number]   Medications:  cefTAZidime (FORTAZ)  IV 1 g (02/18/24 1205)   [START ON 02/20/2024] cefTAZidime (FORTAZ)  IV     dialysis solution 1.5% low-MG/low-CA     [START ON 02/20/2024] vancomycin       (feeding supplement) PROSource Plus  30 mL Oral BID BM   amLODipine   10 mg Oral Daily   And   irbesartan   150 mg Oral Daily   calcitRIOL   0.5 mcg Oral Daily   Chlorhexidine  Gluconate Cloth  6 each Topical Q0600   docusate sodium  100 mg Oral BID   gentamicin  cream  1 Application Topical Daily   lactulose  20 g Oral Q1200   metoprolol succinate  25 mg Oral Daily   multivitamin  1 tablet Oral Daily   pantoprazole  40 mg Oral Daily   polyethylene glycol  17 g Oral Daily   sucroferric oxyhydroxide  1,000 mg Oral BID   Tenapanor  HCl (CKD)  30 mg Oral BID   vancomycin  variable dose per unstable renal function (pharmacist dosing)   Does not apply See admin instructions    Dialysis Orders: [facility] - 7d/week 6 exchanges, 2.8L fill  volume, 1hr dwells. Last fill vol 2L EDW 73.5kg Mircera 200mcg last given 11/4  Assessment/Plan: Abd pain/peritonitis: Lower abdominal pain and slow flow issues with PD at home. PD fluid collected, started on IV Vanc/Ceftazidime -> cell count high (2470), c/w peritonitis. Gram stain/Cx negative so far. This is his 1st episode - will treat with IV abx for 2 weeks, no need to remove cath at this time. ESRD: Issues with slow drain at home and here too - s/p tPA to PD cath and given laxatives without improvement. TDC placed and had HD this AM, Will do HD in center for now with 2 weeks IV antibiotics, possibility of changing back to PD if cath starts working well in the future HTN/volume: was retaining fluid, improved post HD Anemia of ESRD: Hgb 7.5, not due for ESA yet Secondary HPTH: Ca ok, Phos high - continue  Velphoro , Xphozah  not formulary here. Nutrition: Alb low, continue protein supps  Lucie Collet, PA-C 02/18/2024, 12:07 PM  [facility] Pager: [phone]

## 2024-02-18 NOTE — Progress Notes (Addendum)
 Pharmacy Antibiotic Note  Micheal Bean is a 38 y.o. male admitted on 02/15/2024 with sepsis/peritonitis.  Pharmacy has been consulted for vancomycin  and ceftazidime dosing for peritoneal dialysis.   Vancomycin  1750 mg IV Loading dose given on 11/12.   TDC for HD placed on 11/14   Hemodialysis session x 3.5h  completed 11/15 AM  Will give Vancomycin  dose this AM 11/15, and follow up on HD schedule plan.  Plan: Give Vancomycin  750 mg IV x 1 now post HD 11/15.  F/u for next HD and dose Vanc when appropriate. Check random serum vanc level in 3-5 day ( pre HD) once on regular HD schedule. Target pre HD session Vanc level = 15-25 mcg/mL. Continue Ceftazidime 1g q24h IV F/u HD schedule to adjust Vancomycin  and Ceftazidime dosing to qHD dosage when HD schedule known.    Height: 6' 3 (190.5 cm) Weight: 78.8 kg (173 lb 12.8 oz) IBW/kg (Calculated) : 84.5  Temp (24hrs), Avg:98.6 F (37 C), Min:98 F (36.7 C), Max:98.9 F (37.2 C)  Recent Labs  Lab 02/15/24 0522 02/15/24 0633 02/15/24 1744 02/16/24 0402 02/17/24 0422 02/18/24 0030  WBC 13.0*  --   --  8.7 10.2 11.2*  CREATININE  --  19.01*  --  19.42* 20.32* 21.11*  LATICACIDVEN  --   --  0.5  --   --   --     Estimated Creatinine Clearance: 5.4 mL/min (A) (by C-G formula based on SCr of 21.11 mg/dL (H)).    No Known Allergies  Antimicrobials this admission: 11/12 ceftazidime >>  11/12 vancomycin  >>  Microbiology results: 11/12 BCx:  ngtd   Thank you for allowing pharmacy to be a part of this patient's care.  Levorn Gaskins, RPh Clinical Pharmacist  02/18/2024 8:28 AM    ADDENDUM:  Plan for discharge today 02/18/24 Plan HD MWF - changed Ceftazidime dose to 2g IV qHD -MWF Vancomycin  750mg  IV qHD- MWF   Thank you for allowing pharmacy to be part of this patients care team.  Levorn Gaskins, RPh Clinical Pharmacist 02/18/2024

## 2024-02-18 NOTE — Progress Notes (Signed)
 Reviewed AVS, patient expressed understanding of medications, MD follow up reviewed.   Removed IV, Site clean, dry and intact.  See LDA for information on wounds at discharge. Patient states all belongings brought to the hospital at time of admission are accounted for and packed to take home.  Picked up medications from [facility] pharmacy. Nursing staff contacted to transport patient to entrance A, where family member was waiting in vehicle to transport home.

## 2024-02-18 NOTE — Discharge Summary (Signed)
 Physician Discharge Summary  Micheal Bean FMW:[id number] DOB: [birth year] DOA: 02/15/2024  PCP: Tanda Bleacher, MD  Admit date: 02/15/2024 Discharge date: 02/18/2024  Admitted From: Home Disposition:  Home  Discharge Condition:Stable CODE STATUS:FULL Diet recommendation: Renal  Brief/Interim Summary: Patient is a 38 year old male with history of ESRD on peritoneal dialysis, anemia of chronic kidney disease, secondary hyperparathyroidism who presented with left lower quadrant abdominal pain.  Abdominal pain had been going on for 3 days, localized around his peritoneal dialysis catheter, he was managing with Tylenol .  He became unable to perform dialysis, experience shortness of breath and thus presented to the emergency department.  On presentation, he was in sinus tachycardia with normal blood pressure.  Lab work showed WC count of 13, hemoglobin of 7.1, potassium of 3.4, creatinine of 19, lipase of 66.  CT abdomen/pelvis showed moderate to large volume free fluid in the abdomen with associated intraperitoneal gas, small bowel loops in the lower pelvis show mild wall thickening with congested mesentery and interloop mesenteric fluid with findings concerning for possible infection.  Nephrology consulted for dialysis.  Peritoneal fluid analysis suggest SBP.  Peritoneal fluid culture sent: NGTD.  Currently on broad spectrum antibiotics .ceftazidime, vancomycin  . S/P   temporary dialysis catheter  placrment and started hemodialysis . Plan to treat the peritonitis with 2 weeks of IV antibiotics with dialysis as an outpatient as per nephrology.  Nephrology cleared for discharge.  Following problems were addressed during the hospitalization:   Sepsis secondary to spontaneous bacterial peritonitis: Presented with 3-day history of abdominal pain, unable to perform peritoneal dialysis.  Presented with leukocytosis, tachycardia.  Peritoneal fluid analysis concerning for peritonitis. Also showed PD cathter  possibly coiled in the central low pelvis.  Follow-up peritoneal fluid culture:NGTD.  Continue vancomycin , ceftazidime.  Currently on broad spectrum antibiotics .ceftazidime, vancomycin  . S/P   temporary dialysis catheter  placrment and started hemodialysis . Plan to treat the peritonitis with 2 weeks of IV antibiotics with dialysis as an outpatient as per nephrology.  Nephrology cleared for discharge.  Outpatient dialysis is already set up.   Anemia of chronic disease: Secondary to CKD.  Hemoglobin on presentation  in the range of 7.  Baseline hemoglobin around 9-10.  Denies any hematochezia or melena.     ESRD on PD : Nephrology were following for dialysis.  Now will have hemodialysis as an outpatient.   Hyperphosphatemia: Continue phosphate binders as nephrology.    Hypokalemia: Supplemented and corrected   Hypertension: Currently blood pressure stable.  Continue home medications      Discharge Diagnoses:  Principal Problem:   SBP (spontaneous bacterial peritonitis) (HCC) Active Problems:   Sepsis (HCC)   Anemia of chronic disease   End-stage renal disease on peritoneal dialysis (HCC)   Hypokalemia   Essential hypertension   Thrombocytosis    Discharge Instructions  Discharge Instructions     Diet renal with fluid restriction   Complete by: As directed    Discharge instructions   Complete by: As directed    1)Please take your medications as instructed 2)Follow up with outpatient dialysis center 3)Follow up with nephrology   Increase activity slowly   Complete by: As directed    No wound care   Complete by: As directed       Allergies as of 02/18/2024   No Known Allergies      Medication List     TAKE these medications    acetaminophen  500 MG tablet Commonly known as: TYLENOL  Take 1,000 mg  by mouth daily as needed for mild pain (pain score 1-3) or moderate pain (pain score 4-6).   amLODipine -valsartan  10-160 MG tablet Commonly known as: EXFORGE  Take 1  tablet by mouth daily.   calcitRIOL  0.5 MCG capsule Commonly known as: ROCALTROL  Take 0.5 mcg by mouth daily.   Calcium  Acetate 667 MG Tabs Take 667-1,334 mg by mouth See admin instructions. Take 2 tablets three times a day with each meal. Take 1 tablet with each snack.   cefTAZidime 2 g in sodium chloride  0.9 % 100 mL Inject 2 g into the vein every Monday, Wednesday, and Friday with hemodialysis. Start taking on: February 20, 2024   gentamicin  cream 0.1 % Commonly known as: GARAMYCIN  Apply 1 Application topically daily. When changing his dressing   metoprolol succinate 25 MG 24 hr tablet Commonly known as: TOPROL-XL Take 25 mg by mouth daily.   multivitamin Tabs tablet TAKE 1 TABLET BY MOUTH EVERYDAY AT BEDTIME What changed: See the new instructions.   pantoprazole 40 MG tablet Commonly known as: PROTONIX Take 1 tablet (40 mg total) by mouth daily. Start taking on: February 19, 2024   polyethylene glycol 17 g packet Commonly known as: MIRALAX  / GLYCOLAX  Take 17 g by mouth daily. Start taking on: February 19, 2024   sevelamer  carbonate 800 MG tablet Commonly known as: RENVELA  Take 3,200 mg by mouth 3 (three) times daily with meals.   vancomycin  750 MG/150ML Soln Commonly known as: VANCOREADY Inject 150 mLs (750 mg total) into the vein every Monday, Wednesday, and Friday with hemodialysis. Start taking on: February 20, 2024   Velphoro  500 MG chewable tablet Generic drug: sucroferric oxyhydroxide Chew 1,000 mg by mouth in the morning and at bedtime.   Xphozah  30 MG Tabs Generic drug: Tenapanor  HCl (CKD) Take 30 mg by mouth daily as needed (constipation).        Follow-up Information     Center, [facility]. Go on 02/20/2024.   Why: Transitional Care Unit- Monday, Tuesday, Thursday, Friday.  Arrive at 7:10 am for 7:30 am start time. If discharged from hosptial over the weekend, please call home therapy on call nurse at [phone]. Contact  information: [address] [place] [address] [phone]                No Known Allergies  Consultations: Nephrology, IR, vascular surgery   Procedures/Studies: IR TUNNELED CENTRAL VENOUS CATH [facility] W IMG Result Date: 02/17/2024 INDICATION: Chronic renal failure EXAM: TUNNELED tunneled hemodialysis catheter ULTRASOUND AND FLUOROSCOPIC GUIDANCE ANESTHESIA/SEDATION: Moderate (conscious) sedation was employed during this procedure. A total of Versed  1 mg and Fentanyl  50 mcg was administered intravenously by the radiology nurse. Total intra-service moderate Sedation Time: 15 minutes. The patient's level of consciousness and vital signs were monitored continuously by radiology nursing throughout the procedure under my direct supervision. FLUOROSCOPY: Radiation Exposure Index (as provided by the fluoroscopic device): 1 mGy Kerma COMPLICATIONS: None immediate. PROCEDURE: Informed written consent was obtained from the patient after a discussion of the risks, benefits, and alternatives to treatment. Questions regarding the procedure were encouraged and answered. The right neck and chest were prepped with chlorhexidine  in a sterile fashion, and a sterile drape was applied covering the operative field. Maximum barrier sterile technique with sterile gowns and gloves were used for the procedure. A timeout was performed prior to the initiation of the procedure. After creating a small venotomy incision, a micropuncture kit was utilized to access the right internal jugular vein under direct, real-time ultrasound guidance  after the overlying soft tissues were anesthetized with 1% lidocaine  with epinephrine . Ultrasound image documentation was performed. The microwire was kinked to measure appropriate catheter length. The micropuncture sheath was exchanged for a peel-away sheath over a guidewire. A 19 cm tunneled palindrome hemodialysis catheter was tunneled in a retrograde fashion from the anterior chest wall  to the venotomy incision. The catheter was then placed through the peel-away sheath with tip ultimately positioned at the superior caval-atrial junction. Final catheter positioning was confirmed and documented with a spot radiographic image. The catheter aspirates and flushes normally. The catheter was flushed with appropriate volume heparin  dwells. The catheter exit site was secured with a 0-Prolene retention suture. The venotomy incision was closed with an interrupted 4-0 Vicryl, Dermabond and Steri-strips. Dressings were applied. The patient tolerated the procedure well without immediate post procedural complication. FINDINGS: After catheter placement, the tip lies within the superior cavoatrial junction. The catheter aspirates and flushes normally and is ready for immediate use. IMPRESSION: Successful placement of a 19 cm tunneled palindrome hemodialysis cathetervia the right internal jugular vein with tip terminating at the superior caval atrial junction. The catheter is ready for immediate use. Electronically Signed   By: Cordella Banner   On: 02/17/2024 14:47   DG Abd 1 View Result Date: 02/17/2024 EXAM: 1 VIEW XRAY OF THE ABDOMEN 02/17/2024 04:45:00 AM COMPARISON: [id number] CLINICAL HISTORY: [id number] Abdominal pain [id number] Abdominal pain peritoneal dialysis catheter malfunction FINDINGS: LINES, TUBES AND DEVICES: Peritoneal dialysis catheter is coiled in the central pelvis, similar to prior. No evidence for discontinuity along the radiopaque portion of the catheter. BOWEL: Bowel gas pattern is nonspecific. SOFT TISSUES: No opaque urinary calculi. BONES: No acute osseous abnormality. IMPRESSION: 1. No acute abdominal abnormality identified. 2. Peritoneal dialysis catheter remains coiled in the central pelvis without radiopaque discontinuity, unchanged from prior. Electronically signed by: Camellia Candle MD 02/17/2024 05:33 AM EST RP Workstation: [id number]   CT ABDOMEN PELVIS W CONTRAST Result Date:  02/15/2024 CLINICAL DATA:  Left lower quadrant abdominal pain. EXAM: CT ABDOMEN AND PELVIS WITH CONTRAST TECHNIQUE: Multidetector CT imaging of the abdomen and pelvis was performed using the standard protocol following bolus administration of intravenous contrast. RADIATION DOSE REDUCTION: This exam was performed according to the departmental dose-optimization program which includes automated exposure control, adjustment of the mA and/or kV according to patient size and/or use of iterative reconstruction technique. CONTRAST:  75mL OMNIPAQUE IOHEXOL 350 MG/ML SOLN COMPARISON:  11/13/2020 FINDINGS: Lower chest: Unremarkable. Hepatobiliary: No suspicious focal abnormality within the liver parenchyma. A tiny hypodensity in the liver parenchyma is too small to characterize but is statistically most likely benign. No followup imaging is recommended. There is no evidence for gallstones, gallbladder wall thickening, or pericholecystic fluid. No intrahepatic or extrahepatic biliary dilation. Pancreas: No focal mass lesion. No dilatation of the main duct. No intraparenchymal cyst. No peripancreatic edema. Spleen: No splenomegaly. No suspicious focal mass lesion. Adrenals/Urinary Tract: No adrenal nodule or mass. Right kidney surgically absent. Left kidney atrophic with multiple cystic lesions of varying size and attenuation. No overtly suspicious left renal mass. Multiple calcifications are noted in the left kidney. No hydroureter. Bladder is decompressed. Although decompressed state of the gallbladder accentuates wall thickness, there appears to be some associated extraperitoneal circumferential perivesical edema. Stomach/Bowel: Stomach is unremarkable. No gastric wall thickening. No evidence of outlet obstruction. Duodenum is normally positioned as is the ligament of Treitz. No small bowel obstruction. Small bowel loops in the low pelvis show mild wall thickening with congested mesentery  in interloop mesenteric fluid. Wall  of the terminal ileum may be hyperenhancing appears ill-defined. The appendix is not well visualized, but there is no edema or inflammation in the region of the cecal tip to suggest appendicitis. No gross colonic mass. No colonic wall thickening. Vascular/Lymphatic: No abdominal aortic aneurysm. No abdominal aortic atherosclerotic calcification. There is no gastrohepatic or hepatoduodenal ligament lymphadenopathy. No retroperitoneal or mesenteric lymphadenopathy. No pelvic sidewall lymphadenopathy. Reproductive: The prostate gland and seminal vesicles are unremarkable. Other: Moderate to large volume free fluid identified in the abdomen and pelvis with associated intraperitoneal free gas. These findings are nonspecific in the setting of the patient's peritoneal dialysis catheter which is coiled in the central low pelvis. No overt peritoneal enhancement or thickening. Musculoskeletal: Diffuse bony changes compatible with chronic renal disease. Bilateral pars interarticularis defects noted at L5. IMPRESSION: 1. Moderate to large volume free fluid in the abdomen and pelvis with associated intraperitoneal free gas. These findings are nonspecific in the setting of the patient's peritoneal dialysis catheter which is coiled in the central low pelvis. No overt peritoneal enhancement or thickening. 2. Small bowel loops in the low pelvis show mild wall thickening with congested mesentery and interloop mesenteric fluid. Wall of the terminal ileum may be hyperenhancing and appears ill-defined. Imaging features may be related to infectious/inflammatory enteritis. Despite the lack of overt peritoneal thickening/enhancement, correlation for signs/symptoms of peritonitis recommended. 3. Although decompressed state of the gallbladder accentuates wall thickness, there appears to be some associated extraperitoneal circumferential perivesical edema. Correlation with urinalysis recommended to exclude cystitis. 4. Right kidney surgically  absent. Left kidney atrophic with multiple cystic lesions of varying size and attenuation. No overtly suspicious left renal mass. 5. Diffuse bony changes compatible with chronic renal disease. Electronically Signed   By: Camellia Candle M.D.   On: 02/15/2024 06:28      Subjective: Patient seen and examined at bedside today.  Hemodynamically stable.  Abdomen is significantly less distended and tender today.  He feels better.  He is eager to go home.  No new problems.  Discharge planning discussed with mother at bedside.  Discharge Exam: Vitals:   02/18/24 0511 02/18/24 0727  BP: (!) 140/95 136/88  Pulse:  (!) 117  Resp: 18 19  Temp: 98.9 F (37.2 C) 98.5 F (36.9 C)  SpO2: 98% 97%   Vitals:   02/18/24 0400 02/18/24 0420 02/18/24 0511 02/18/24 0727  BP: (!) 136/90 (!) 140/98 (!) 140/95 136/88  Pulse: (!) 113 (!) 108  (!) 117  Resp: (!) 25 (!) 27 18 19   Temp:  98.8 F (37.1 C) 98.9 F (37.2 C) 98.5 F (36.9 C)  TempSrc:  Oral  Oral  SpO2: (!) 71% 98% 98% 97%  Weight:      Height:        General: Pt is alert, awake, not in acute distress Cardiovascular: RRR, S1/S2 +, no rubs, no gallops, hemodialysis catheter on the left chest Respiratory: CTA bilaterally, no wheezing, no rhonchi Abdominal: Soft, NT, ND, bowel sounds +, peritoneal dialysis catheter Extremities: no edema, no cyanosis    The results of significant diagnostics from this hospitalization (including imaging, microbiology, ancillary and laboratory) are listed below for reference.     Microbiology: Recent Results (from the past 240 hours)  Body fluid culture w Gram Stain     Status: None (Preliminary result)   Collection Time: 02/15/24  5:44 AM   Specimen: Peritoneal Dialysate; Body Fluid  Result Value Ref Range Status   Specimen Description  PERITONEAL DIALYSATE  Final   Special Requests NONE  Final   Gram Stain   Final    RARE WBC PRESENT,BOTH PMN AND MONONUCLEAR NO ORGANISMS SEEN    Culture   Final    NO  GROWTH 2 DAYS Performed at [facility], [address], [place], [place] [address]    Report Status PENDING  Incomplete  Culture, blood (single) w Reflex to ID Panel     Status: None (Preliminary result)   Collection Time: 02/16/24  4:02 AM   Specimen: BLOOD  Result Value Ref Range Status   Specimen Description BLOOD BLOOD RIGHT ARM  Final   Special Requests   Final    BOTTLES DRAWN AEROBIC AND ANAEROBIC Blood Culture adequate volume   Culture   Final    NO GROWTH 2 DAYS Performed at [facility], [address] [place], [place] [address]    Report Status PENDING  Incomplete     Labs: BNP (last 3 results) No results for input(s): BNP in the last 8760 hours. Basic Metabolic Panel: Recent Labs  Lab 02/15/24 0633 02/16/24 0402 02/17/24 0422 02/18/24 0030  NA 132* 133* 132* 132*  K 3.4* 3.2* 3.2* 3.6  CL 92* 92* 94* 94*  CO2 24 23 23  20*  GLUCOSE 87 88 90 85  BUN 68* 67* 69* 76*  CREATININE 19.01* 19.42* 20.32* 21.11*  CALCIUM  8.1* 8.9 8.7* 8.6*  PHOS  --  6.1* 5.6* 5.7*   Liver Function Tests: Recent Labs  Lab 02/15/24 0633 02/16/24 0402 02/17/24 0422 02/18/24 0030  AST 16  --   --   --   ALT 27  --   --   --   ALKPHOS 60  --   --   --   BILITOT 0.7  --   --   --   PROT 5.6*  --   --   --   ALBUMIN  1.7* 1.8* 1.7* <1.5*   Recent Labs  Lab 02/15/24 0633  LIPASE 66*   No results for input(s): AMMONIA in the last 168 hours. CBC: Recent Labs  Lab 02/15/24 0522 02/16/24 0402 02/17/24 0422 02/18/24 0030  WBC 13.0* 8.7 10.2 11.2*  NEUTROABS 9.9*  --   --   --   HGB 7.1* 8.0* 8.0* 7.5*  HCT 22.6* 24.4* 24.8* 22.8*  MCV 93.8 87.8 88.9 90.5  PLT 558* 486* 464* 444*   Cardiac Enzymes: No results for input(s): CKTOTAL, CKMB, CKMBINDEX, TROPONINI in the last 168 hours. BNP: Invalid input(s): POCBNP CBG: No results for input(s): GLUCAP in the last 168 hours. D-Dimer No results for input(s): DDIMER in the last 72 hours. Hgb  A1c No results for input(s): HGBA1C in the last 72 hours. Lipid Profile No results for input(s): CHOL, HDL, LDLCALC, TRIG, CHOLHDL, LDLDIRECT in the last 72 hours. Thyroid function studies No results for input(s): TSH, T4TOTAL, T3FREE, THYROIDAB in the last 72 hours.  Invalid input(s): FREET3 Anemia work up No results for input(s): VITAMINB12, FOLATE, FERRITIN, TIBC, IRON, RETICCTPCT in the last 72 hours. Urinalysis    Component Value Date/Time   COLORURINE STRAW (A) 06/08/2022 1323   APPEARANCEUR CLEAR 06/08/2022 1323   LABSPEC 1.005 06/08/2022 1323   PHURINE 7.0 06/08/2022 1323   GLUCOSEU 50 (A) 06/08/2022 1323   HGBUR NEGATIVE 06/08/2022 1323   BILIRUBINUR NEGATIVE 06/08/2022 1323   KETONESUR NEGATIVE 06/08/2022 1323   PROTEINUR 100 (A) 06/08/2022 1323   UROBILINOGEN 0.2 06/25/2012 1826   NITRITE NEGATIVE  06/08/2022 1323   LEUKOCYTESUR SMALL (A) 06/08/2022 1323   Sepsis Labs Recent Labs  Lab 02/15/24 0522 02/16/24 0402 02/17/24 0422 02/18/24 0030  WBC 13.0* 8.7 10.2 11.2*   Microbiology Recent Results (from the past 240 hours)  Body fluid culture w Gram Stain     Status: None (Preliminary result)   Collection Time: 02/15/24  5:44 AM   Specimen: Peritoneal Dialysate; Body Fluid  Result Value Ref Range Status   Specimen Description PERITONEAL DIALYSATE  Final   Special Requests NONE  Final   Gram Stain   Final    RARE WBC PRESENT,BOTH PMN AND MONONUCLEAR NO ORGANISMS SEEN    Culture   Final    NO GROWTH 2 DAYS Performed at [facility], [address] [place], [place] [address]    Report Status PENDING  Incomplete  Culture, blood (single) w Reflex to ID Panel     Status: None (Preliminary result)   Collection Time: 02/16/24  4:02 AM   Specimen: BLOOD  Result Value Ref Range Status   Specimen Description BLOOD BLOOD RIGHT ARM  Final   Special Requests   Final    BOTTLES DRAWN AEROBIC AND ANAEROBIC Blood Culture  adequate volume   Culture   Final    NO GROWTH 2 DAYS Performed at [facility], [address] [place], [place] [address]    Report Status PENDING  Incomplete    Please note: You were cared for by a hospitalist during your hospital stay. Once you are discharged, your primary care physician will handle any further medical issues. Please note that NO REFILLS for any discharge medications will be authorized once you are discharged, as it is imperative that you return to your primary care physician (or establish a relationship with a primary care physician if you do not have one) for your post hospital discharge needs so that they can reassess your need for medications and monitor your lab values.    Time coordinating discharge: 40 minutes  SIGNED:   Ivonne Mustache, MD  [facility] 02/18/2024, 11:18 AM Pager [phone]  If 7PM-7AM, please contact night-coverage [email or web address] Password [email or web address]

## 2024-02-18 NOTE — Progress Notes (Signed)
   02/18/24 0420  Vitals  Temp 98.8 F (37.1 C)  Temp Source Oral  BP (!) 140/98  MAP (mmHg) 107  BP Location Right Arm  BP Method Automatic  Pulse Rate (!) 108  ECG Heart Rate (!) 110  Resp (!) 27  Oxygen Therapy  SpO2 98 %  During Treatment Monitoring  Blood Flow Rate (mL/min) 300 mL/min  Arterial Pressure (mmHg) -147.87 mmHg  Venous Pressure (mmHg) 249.89 mmHg  TMP (mmHg) 0 mmHg  Ultrafiltration Rate (mL/min) 954 mL/min  Dialysate Flow Rate (mL/min) 300 ml/min  Dialysate Potassium Concentration 3  Dialysate Calcium  Concentration 2.5  Duration of HD Treatment -hour(s) 3.2 hour(s)  Cumulative Fluid Removed (mL) per Treatment  1711.17  HD Safety Checks Performed Yes  Intra-Hemodialysis Comments Tx completed  Post Treatment  Dialyzer Clearance Heavily streaked  Liters Processed 60.3  Fluid Removed (mL) 1900 mL  Tolerated HD Treatment Yes  Hemodialysis Catheter Right Internal jugular Double lumen Permanent (Tunneled)  Placement Date: 02/17/24   Serial / Lot #: [id number]  Expiration Date: 07/03/28  Time Out: Correct patient;Correct site;Correct procedure  Maximum sterile barrier precautions: Hand hygiene;Cap;Mask;Sterile gown;Sterile gloves;Large sterile sheet;Steri...  Site Condition No complications  Blue Lumen Status Flushed;Heparin  locked  Red Lumen Status Flushed;Heparin  locked  Catheter fill solution Heparin  1000 units/ml  Catheter fill volume (Arterial) 1.6 cc  Catheter fill volume (Venous) 1.6  Dressing Type Transparent  Dressing Status Antimicrobial disc/dressing in place;Clean, Dry, Intact  Drainage Description None  Dressing Change Due 02/23/24  Post treatment catheter status Capped and Clamped

## 2024-02-20 ENCOUNTER — Telehealth: Payer: Self-pay

## 2024-02-20 NOTE — Progress Notes (Signed)
 Late Note Entry- Feb 20, 2024  Pt was d/c on Saturday. Contacted TCU RN this morning who confirms that pt receiving treatment this morning.   Randine Mungo Dialysis Navigator [phone]

## 2024-02-20 NOTE — Transitions of Care (Post Inpatient/ED Visit) (Signed)
   02/20/2024  Name: Micheal Bean MRN: [id number] DOB: Aug 13, 1985  Today's TOC FU Call Status: Today's TOC FU Call Status:: Unsuccessful Call (1st Attempt) Unsuccessful Call (1st Attempt) Date: 02/20/24  Attempted to reach the patient regarding the most recent Inpatient/ED visit.  Follow Up Plan: Additional outreach attempts will be made to reach the patient to complete the Transitions of Care (Post Inpatient/ED visit) call.   Alan Ee, RN, BSN, CEN [employer]- Transition of Care Team.  [facility] [phone]

## 2024-02-21 ENCOUNTER — Telehealth: Payer: Self-pay

## 2024-02-21 LAB — CULTURE, BLOOD (SINGLE)
Culture: NO GROWTH
Special Requests: ADEQUATE

## 2024-02-21 NOTE — Transitions of Care (Post Inpatient/ED Visit) (Signed)
   02/21/2024  Name: Micheal Bean MRN: [id number] DOB: Dec 03, 1985  Today's TOC FU Call Status: Today's TOC FU Call Status:: Unsuccessful Call (2nd Attempt) Unsuccessful Call (2nd Attempt) Date: 02/21/24  Attempted to reach the patient regarding the most recent Inpatient/ED visit.  Follow Up Plan: Additional outreach attempts will be made to reach the patient to complete the Transitions of Care (Post Inpatient/ED visit) call.   Alan Ee, RN, BSN, CEN [employer]- Transition of Care Team.  [facility] [phone]

## 2024-02-22 ENCOUNTER — Telehealth: Payer: Self-pay | Admitting: *Deleted

## 2024-02-22 NOTE — Transitions of Care (Post Inpatient/ED Visit) (Signed)
   02/22/2024  Name: DERAY DAWES MRN: [id number] DOB: 1985/04/09  Today's TOC FU Call Status: Today's TOC FU Call Status:: Unsuccessful Call (3rd Attempt) Unsuccessful Call (3rd Attempt) Date: 02/22/24  Attempted to reach the patient regarding the most recent Inpatient/ED visit.  Patient was at dialysis and request a call back tomorrow.  Follow Up Plan: Additional outreach attempts will be made to reach the patient to complete the Transitions of Care (Post Inpatient/ED visit) call.   Andrea Dimes RN, BSN [place]  [facility] RN Care Manager [phone]

## 2024-02-23 ENCOUNTER — Telehealth: Payer: Self-pay | Admitting: *Deleted

## 2024-02-23 NOTE — Transitions of Care (Post Inpatient/ED Visit) (Signed)
   02/23/2024  Name: Micheal Bean MRN: [id number] DOB: Feb 08, 1986  Today's TOC FU Call Status: Today's TOC FU Call Status::  (Unsuccessful Call (4th attempt))  Attempted to reach the patient regarding the most recent Inpatient/ED visit.  Follow Up Plan: No further outreach attempts will be made at this time. We have been unable to contact the patient.  Andrea Dimes RN, BSN [place]  [facility] RN Care Manager [phone]

## 2024-03-05 ENCOUNTER — Other Ambulatory Visit: Payer: Self-pay | Admitting: Nephrology

## 2024-03-05 ENCOUNTER — Ambulatory Visit
Admission: RE | Admit: 2024-03-05 | Discharge: 2024-03-05 | Disposition: A | Discharge status: Home / Self Care | Source: Ambulatory Visit | Attending: Nephrology | Admitting: Nephrology

## 2024-03-05 DIAGNOSIS — Z4902 Encounter for fitting and adjustment of peritoneal dialysis catheter: Secondary | ICD-10-CM

## 2024-03-12 ENCOUNTER — Telehealth: Payer: Self-pay | Admitting: *Deleted

## 2024-03-12 NOTE — Transitions of Care (Post Inpatient/ED Visit) (Signed)
   03/12/2024  Name: Micheal Bean MRN: [id number] DOB: 04/30/1985  Today's TOC FU Call Status: Today's TOC FU Call Status:: Unsuccessful Call (1st Attempt) Unsuccessful Call (1st Attempt) Date: 03/12/24  Attempted to reach the patient regarding the most recent Inpatient/ED visit.  Follow Up Plan: Additional outreach attempts will be made to reach the patient to complete the Transitions of Care (Post Inpatient/ED visit) call.   Andrea Dimes RN, BSN [place]  [facility] RN Care Manager [phone]

## 2024-03-12 NOTE — Transitions of Care (Post Inpatient/ED Visit) (Signed)
   03/12/2024  Name: Micheal Bean MRN: [id number] DOB: 1986-03-09  Today's TOC FU Call Status: Today's TOC FU Call Status:: Unsuccessful Call (2nd Attempt) Unsuccessful Call (2nd Attempt) Date: 03/12/24  Attempted to reach the patient regarding the most recent Inpatient/ED visit.  Follow Up Plan: Additional outreach attempts will be made to reach the patient to complete the Transitions of Care (Post Inpatient/ED visit) call.   Andrea Dimes RN, BSN [place]  [facility] RN Care Manager [phone]

## 2024-03-13 ENCOUNTER — Telehealth: Payer: Self-pay

## 2024-03-13 NOTE — Transitions of Care (Post Inpatient/ED Visit) (Signed)
   03/13/2024  Name: Micheal Bean MRN: [id number] DOB: [birth year]  Today's TOC FU Call Status: Today's TOC FU Call Status:: Unsuccessful Call (3rd Attempt) Unsuccessful Call (3rd Attempt) Date: 03/13/24  Attempted to reach the patient regarding the most recent Inpatient/ED visit.  Follow Up Plan: No further outreach attempts will be made at this time. We have been unable to contact the patient.  Alan Ee, RN, BSN, CEN [employer]- Transition of Care Team.  [facility] [phone]

## 2024-03-23 ENCOUNTER — Ambulatory Visit (HOSPITAL_COMMUNITY)
Admission: RE | Admit: 2024-03-23 | Discharge: 2024-03-23 | Disposition: A | Discharge status: Home / Self Care | Attending: Vascular Surgery | Admitting: Vascular Surgery

## 2024-03-23 ENCOUNTER — Encounter (HOSPITAL_COMMUNITY): Payer: Self-pay

## 2024-03-23 ENCOUNTER — Other Ambulatory Visit: Payer: Self-pay

## 2024-03-23 ENCOUNTER — Encounter (HOSPITAL_COMMUNITY): Payer: Self-pay | Admitting: Vascular Surgery

## 2024-03-23 DIAGNOSIS — N186 End stage renal disease: Secondary | ICD-10-CM | POA: Insufficient documentation

## 2024-03-23 DIAGNOSIS — T8249XA Other complication of vascular dialysis catheter, initial encounter: Secondary | ICD-10-CM | POA: Diagnosis not present

## 2024-03-23 DIAGNOSIS — Z992 Dependence on renal dialysis: Secondary | ICD-10-CM | POA: Diagnosis not present

## 2024-03-23 HISTORY — PX: TUNNELLED CATHETER EXCHANGE: CATH118373

## 2024-03-23 SURGERY — TUNNELLED CATHETER EXCHANGE
Anesthesia: LOCAL | Site: Chest | Laterality: Right

## 2024-03-23 MED ORDER — ALTEPLASE 2 MG IJ SOLR: 2.0000 mg | Freq: Once | INTRAMUSCULAR | Status: DC | PRN

## 2024-03-23 MED ORDER — HEPARIN SODIUM (PORCINE) 1000 UNIT/ML IJ SOLN
INTRAMUSCULAR | Status: AC
  Filled 2024-03-23: qty 10

## 2024-03-23 MED ORDER — PENTAFLUOROPROP-TETRAFLUOROETH EX AERO: 1.0000 | INHALATION_SPRAY | CUTANEOUS | Status: DC | PRN

## 2024-03-23 MED ORDER — LIDOCAINE HCL (PF) 1 % IJ SOLN: 5.0000 mL | INTRAMUSCULAR | Status: DC | PRN

## 2024-03-23 MED ORDER — LIDOCAINE HCL (PF) 1 % IJ SOLN
INTRAMUSCULAR | Status: AC
  Filled 2024-03-23: qty 30

## 2024-03-23 MED ORDER — LIDOCAINE HCL (PF) 1 % IJ SOLN
INTRAMUSCULAR | Status: DC | PRN
  Administered 2024-03-23: 15 mL via INTRADERMAL
  Administered 2024-03-23: 5 mL via INTRADERMAL

## 2024-03-23 MED ORDER — LIDOCAINE-PRILOCAINE 2.5-2.5 % EX CREA: 1.0000 | TOPICAL_CREAM | CUTANEOUS | Status: DC | PRN

## 2024-03-23 MED ORDER — HEPARIN SODIUM (PORCINE) 1000 UNIT/ML IJ SOLN
INTRAMUSCULAR | Status: DC | PRN
  Administered 2024-03-23 (×2): 1600 [IU] via INTRAVENOUS

## 2024-03-23 MED ORDER — HEPARIN (PORCINE) IN NACL 1000-0.9 UT/500ML-% IV SOLN
INTRAVENOUS | Status: DC | PRN
  Administered 2024-03-23: 500 mL

## 2024-03-23 MED ORDER — ANTICOAGULANT SODIUM CITRATE 4% (200MG/5ML) IV SOLN: 5.0000 mL | Status: DC | PRN

## 2024-03-23 MED ORDER — HEPARIN SODIUM (PORCINE) 1000 UNIT/ML DIALYSIS: 1000.0000 [IU] | INTRAMUSCULAR | Status: DC | PRN

## 2024-03-23 SURGICAL SUPPLY — 3 items
CATH PALINDROME-P 19CM W/VT (CATHETERS) IMPLANT
GLIDEWIRE STIFF .35X180X3 HYDR (WIRE) IMPLANT
TRAY PV CATH (CUSTOM PROCEDURE TRAY) ×1 IMPLANT

## 2024-03-23 NOTE — H&P (Signed)
 " H&P      History of Present Illness: This is a 38 y.o. male with ESRD that presents for tunneled dialysis catheter exchange.  States this was placed last month at [facility] and not working.  Past Medical History:  Diagnosis Date   Anemia    ESRD (end stage renal disease) (HCC)    PD at home   History of blood transfusion 11/2020   4 units   History of blood transfusion    Seizure (HCC)    [facility]- [facility]    Past Surgical History:  Procedure Laterality Date   AV FISTULA PLACEMENT Right 03/02/2021   Procedure: RIGHT ARM Radio-Cephalic  ARTERIOVENOUS (AV) FISTULA CREATION;  Surgeon: Lanis Fonda BRAVO, MD;  Location: [facility] OR;  Service: Vascular;  Laterality: Right;   CAPD INSERTION N/A 05/20/2021   Procedure: LAPAROSCOPIC INSERTION CONTINUOUS AMBULATORY PERITONEAL DIALYSIS  CATHETER WITH POSSIBLE OMENTOPEXY;  Surgeon: Magda Debby SAILOR, MD;  Location: [facility] OR;  Service: Vascular;  Laterality: N/A;   CAPD INSERTION N/A 11/27/2023   Procedure: LAPAROSCOPIC INSERTION CONTINUOUS AMBULATORY PERITONEAL DIALYSIS  (CAPD) CATHETER;  Surgeon: Serene Gaile ORN, MD;  Location: [facility] OR;  Service: Vascular;  Laterality: N/A;   IR FLUORO GUIDE CV LINE RIGHT  11/18/2020   IR TUNNELED CENTRAL VENOUS CATH PLC W IMG  02/17/2024   IR US  GUIDE VASC ACCESS RIGHT  11/18/2020   NEPHRECTOMY     childhhod, RT side   PARATHYROIDECTOMY Left 05/31/2022   Procedure: TOTAL PARATHYROIDECTOMY WITH AUTOTRANSPLANT TO LEFT FOREARM;  Surgeon: Eletha Boas, MD;  Location: [facility] OR;  Service: General;  Laterality: Left;   QUADRICEPS TENDON REPAIR Right 10/29/2021   Procedure: RIGHT QUADRICEP REPAIR AND PRIMARY REPAIR ANTERIOR CAPSULE;  Surgeon: Doll Skates, MD;  Location: [facility] OR;  Service: Orthopedics;  Laterality: Right;    Allergies[1]  Prior to Admission medications  Medication Sig Start Date End Date Taking? Authorizing Provider  acetaminophen  (TYLENOL ) 500 MG tablet Take 1,000 mg by mouth daily as needed for  mild pain (pain score 1-3) or moderate pain (pain score 4-6).    [provider]  amLODipine -valsartan  (EXFORGE ) 10-160 MG tablet Take 1 tablet by mouth daily. 11/20/23   [provider]  calcitRIOL  (ROCALTROL ) 0.5 MCG capsule Take 0.5 mcg by mouth daily.    [provider]  Calcium  Acetate 667 MG TABS Take 667-1,334 mg by mouth See admin instructions. Take 2 tablets three times a day with each meal. Take 1 tablet with each snack. Patient not taking: Reported on 02/15/2024    [provider]  cefTAZidime  2 g in sodium chloride  0.9 % 100 mL Inject 2 g into the vein every Monday, Wednesday, and Friday with hemodialysis. 02/20/24   Adhikari, Amrit, MD  gentamicin  cream (GARAMYCIN ) 0.1 % Apply 1 Application topically daily. When changing his dressing 05/21/21   [provider]  metoprolol  succinate (TOPROL -XL) 25 MG 24 hr tablet Take 25 mg by mouth daily. 11/30/23   [provider]  multivitamin (RENA-VIT) TABS tablet TAKE 1 TABLET BY MOUTH EVERYDAY AT BEDTIME Patient taking differently: Take 1 tablet by mouth daily. 06/08/21   Tanda Bleacher, MD  pantoprazole  (PROTONIX ) 40 MG tablet Take 1 tablet (40 mg total) by mouth daily. 02/19/24   Adhikari, Amrit, MD  polyethylene glycol powder (GLYCOLAX /MIRALAX ) 17 GM/SCOOP powder Dissolve 1 capful (17g) in 4-8 ounces of liquid and take by mouth daily. 02/19/24   Jillian Buttery, MD  sevelamer  carbonate (RENVELA ) 800 MG tablet  Take 3,200 mg by mouth 3 (three) times daily with meals. Patient not taking: Reported on 02/15/2024 08/03/23   [provider]  Tenapanor  HCl, CKD, (XPHOZAH ) 30 MG TABS Take 30 mg by mouth daily as needed (constipation).    [provider]  vancomycin  (VANCOREADY) 750 MG/150ML SOLN Inject 150 mLs (750 mg total) into the vein every Monday, Wednesday, and Friday with hemodialysis. 02/20/24   Jillian Buttery, MD  VELPHORO  500 MG chewable tablet Chew 1,000 mg by mouth in the  morning and at bedtime. 11/07/23   [provider]    Social History   Socioeconomic History   Marital status: Single    Spouse name: Not on file   Number of children: 2   Years of education: Not on file   Highest education level: Not on file  Occupational History   Not on file  Tobacco Use   Smoking status: Never   Smokeless tobacco: Never  Vaping Use   Vaping status: Never Used  Substance and Sexual Activity   Alcohol use: Not Currently   Drug use: Not Currently   Sexual activity: Not Currently  Other Topics Concern   Not on file  Social History Narrative   Not on file   Social Drivers of Health   Tobacco Use: Low Risk (12/04/2023)   Patient History    Smoking Tobacco Use: Never    Smokeless Tobacco Use: Never    Passive Exposure: Not on file  Financial Resource Strain: Low Risk (03/06/2024)   Received from [facility]   Overall Financial Resource Strain (CARDIA)    How hard is it for you to pay for the very basics like food, housing, medical care, and heating?: Not very hard  Food Insecurity: Low Risk (03/06/2024)   Received from [facility]   Epic    Within the past 12 months, you worried that your food would run out before you got money to buy more: Never true    Within the past 12 months, the food you bought just didn't last and you didn't have money to get more. : Never true  Transportation Needs: No Transportation Needs (02/15/2024)   Epic    Lack of Transportation (Medical): No    Lack of Transportation (Non-Medical): No  Physical Activity: Not on file  Stress: Not on file  Social Connections: Unknown (03/06/2024)   Received from [facility]   Social Connection and Isolation Panel    In a typical week, how many times do you talk on the phone with family, friends, or neighbors?: More than three times a week    How often do you get together with friends or relatives?: More than three times a week    How often do you attend church or religious  services?: Patient declined    Active Member of Clubs or Organizations: Not on file    Attends Banker Meetings: Not on file    Marital Status: Not on file  Intimate Partner Violence: Not At Risk (02/15/2024)   Epic    Fear of Current or Ex-Partner: No    Emotionally Abused: No    Physically Abused: No    Sexually Abused: No  Depression (PHQ2-9): Not on file  Alcohol Screen: Not on file  Housing: Low Risk (03/06/2024)   Received from [facility]   Epic    What is your living situation today?: I have a steady place to live    Think about the place you live. Do  you have problems with any of the following? Choose all that apply:: None/None on this list  Utilities: Low Risk (03/06/2024)   Received from [facility]   Utilities    In the past 12 months has the electric, gas, oil, or water  company threatened to shut off services in your home? : No  Health Literacy: Not on file     Family History  Problem Relation Age of Onset   Hypertension Mother    Kidney disease Maternal Grandmother     ROS: [x]  Positive   [ ]  Negative   [ ]  All sytems reviewed and are negative  Cardiovascular: []  chest pain/pressure []  palpitations []  SOB lying flat []  DOE []  pain in legs while walking []  pain in legs at rest []  pain in legs at night []  non-healing ulcers []  hx of DVT []  swelling in legs  Pulmonary: []  productive cough []  asthma/wheezing []  home O2  Neurologic: []  weakness in []  arms []  legs []  numbness in []  arms []  legs []  hx of CVA []  mini stroke [] difficulty speaking or slurred speech []  temporary loss of vision in one eye []  dizziness  Hematologic: []  hx of cancer []  bleeding problems []  problems with blood clotting easily  Endocrine:   []  diabetes []  thyroid disease  GI []  vomiting blood []  blood in stool  GU: []  CKD/renal failure []  HD--[]  M/W/F or []  T/T/S []  burning with urination []  blood in urine  Psychiatric: []  anxiety []   depression  Musculoskeletal: []  arthritis []  joint pain  Integumentary: []  rashes []  ulcers  Constitutional: []  fever []  chills   Physical Examination  Vitals:   03/23/24 0851 03/23/24 0856  BP: (!) 165/100 (!) 165/100  Pulse: (!) 104 93  Resp: 12 16  Temp: 98.2 F (36.8 C)   SpO2: 100% 99%   There is no height or weight on file to calculate BMI.  General:  WDWN in NAD Gait: Not observed HENT: WNL, normocephalic Pulmonary: normal non-labored breathing Cardiac: regular, without  Murmurs, rubs or gallops Abdomen:  soft, NT/ND Vascular Exam/Pulses: Right IJ TDC  CBC    Component Value Date/Time   WBC 11.2 (H) 02/18/2024 0030   RBC 2.52 (L) 02/18/2024 0030   HGB 7.5 (L) 02/18/2024 0030   HCT 22.8 (L) 02/18/2024 0030   PLT 444 (H) 02/18/2024 0030   MCV 90.5 02/18/2024 0030   MCH 29.8 02/18/2024 0030   MCHC 32.9 02/18/2024 0030   RDW 15.1 02/18/2024 0030   LYMPHSABS 1.9 02/15/2024 0522   MONOABS 0.8 02/15/2024 0522   EOSABS 0.4 02/15/2024 0522   BASOSABS 0.0 02/15/2024 0522    BMET    Component Value Date/Time   NA 132 (L) 02/18/2024 0030   K 3.6 02/18/2024 0030   CL 94 (L) 02/18/2024 0030   CO2 20 (L) 02/18/2024 0030   GLUCOSE 85 02/18/2024 0030   BUN 76 (H) 02/18/2024 0030   CREATININE 21.11 (H) 02/18/2024 0030   CALCIUM  8.6 (L) 02/18/2024 0030   GFRNONAA 3 (L) 02/18/2024 0030   GFRAA (L) 07/07/2009 0955    56        The eGFR has been calculated using the MDRD equation. This calculation has not been validated in all clinical situations. eGFR's persistently <60 mL/min signify possible Chronic Kidney Disease.    COAGS: Lab Results  Component Value Date   INR 1.2 05/13/2023   INR 1.2 11/25/2021   INR 1.5 (H) 11/13/2020     Non-Invasive Vascular Imaging:  ASSESSMENT/PLAN: This is a 38 y.o. male with ESRD that presents for tunneled dialysis catheter exchange.  Patient had a tunneled catheter placed last month at [facility] that is  not working.  Discussed plan for exchange of catheter under fluoroscopic guidance.  All questions answered  Lonni DOROTHA Gaskins, MD [facility] Office: [phone]  Lonni JINNY Gaskins     [1] No Known Allergies  "

## 2024-03-23 NOTE — Op Note (Signed)
" ° ° °  Patient name: Micheal Bean MRN: [id number] DOB: [birth year] Sex: male  03/23/2024 Pre-operative Diagnosis: End-stage renal disease with malfunctioning right IJ [facility] Post-operative diagnosis:  Same Surgeon:  Lonni DOROTHA Gaskins, MD Procedure Performed: 1.  Exchange of right internal jugular vein tunneled dialysis catheter under fluoroscopic guidance (new 19 cm palindrome)  Indications: Patient is a 38 year old male that presents with malfunctioning right IJ TDC placed at [facility] last month.  He presents for catheter exchange after risk-benefits discussed.  Findings:   A stiff Glidewire was used under fluoroscopic guidance to exchanged for a new 19 cm palindrome catheter in the right IJ with the tip in the right atrium.  This flushed and aspirated easily   Procedure:  The patient was identified in the holding area and taken to [facility] lab.  Placed on the table in supine position.  The existing right neck and catheter were prepped and draped in standard sterile fashion.  Timeout was performed.  Initially injected about 20 mL of 1% lidocaine  without epinephrine  around the existing catheter.  I then put a stiff Glidewire through the catheter into the IVC.  Freed up the cuff with hemostat with blunt dissection.  The existing catheter was removed in its entirety and I placed a new 19 cm palindrome catheter in the right atrium over the wire through the right IJ under fluoroscopic guidance.  This flushed and aspirated easily.  Loaded with heparin  according to manufactures recommendations.  Secured with 2-0 nylon and dermabond.    Lonni DOROTHA Gaskins, MD [facility] [place] Office: [phone]   "

## 2024-05-07 ENCOUNTER — Other Ambulatory Visit: Payer: Self-pay

## 2024-05-07 DIAGNOSIS — N186 End stage renal disease: Secondary | ICD-10-CM

## 2024-05-31 ENCOUNTER — Ambulatory Visit (HOSPITAL_COMMUNITY)

## 2024-05-31 ENCOUNTER — Ambulatory Visit: Admitting: Vascular Surgery
